# Patient Record
Sex: Female | Born: 1945 | Race: Black or African American | Hispanic: No | Smoking: Current every day smoker
Health system: Southern US, Community
[De-identification: ages and names within clinical notes are randomized; demographics above are authoritative.]

## PROBLEM LIST (undated history)

## (undated) DIAGNOSIS — N6009 Solitary cyst of unspecified breast: Secondary | ICD-10-CM

## (undated) DIAGNOSIS — F32A Depression, unspecified: Secondary | ICD-10-CM

## (undated) DIAGNOSIS — F329 Major depressive disorder, single episode, unspecified: Secondary | ICD-10-CM

## (undated) DIAGNOSIS — C50912 Malignant neoplasm of unspecified site of left female breast: Secondary | ICD-10-CM

## (undated) DIAGNOSIS — Z9289 Personal history of other medical treatment: Secondary | ICD-10-CM

## (undated) DIAGNOSIS — I10 Essential (primary) hypertension: Secondary | ICD-10-CM

## (undated) DIAGNOSIS — I739 Peripheral vascular disease, unspecified: Secondary | ICD-10-CM

## (undated) HISTORY — PX: BREAST CYST EXCISION: SHX579

## (undated) HISTORY — PX: ABDOMINAL HYSTERECTOMY: SHX81

## (undated) HISTORY — DX: Depression, unspecified: F32.A

## (undated) HISTORY — DX: Solitary cyst of unspecified breast: N60.09

## (undated) HISTORY — DX: Major depressive disorder, single episode, unspecified: F32.9

## (undated) HISTORY — DX: Essential (primary) hypertension: I10

## (undated) NOTE — *Deleted (*Deleted)
Oral Oncology Pharmacist Encounter  Received new prescription for palbociclib Ilda Foil) for the treatment of newly metastatic breast cancer that was previously HR+, HER2- in conjunction with anastrozole, planned duration until disease progression or unacceptable drug toxicity. No confirmation of HR or HER2 status.   Labs from 10/13 assessed, CMP and CBC wnl. Prescription dose and frequency assessed.   Current medication list in Epic reviewed, a DDI with omeprazole identified. PPIs may decrease therapeutic efficacy of Ibrance. Omeprazole not on patient's medication list, but was last filled 09/20/19 for 90 days - will confirm with patient if she is taking or not, may consider famotidine for acid supprssion if needed.   Prescription has been e-scribed to the [facility] for benefits analysis and approval. Medication re-sent for tablets instead of capsules.   Oral Oncology Clinic will continue to follow for insurance authorization, copayment issues, initial counseling and start date.  Fara Olden, PharmD PGY2 Hematology/Oncology Pharmacy Resident Oral Chemotherapy Navigation Clinic 11/15/2019 1:08 PM

---
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
## 1999-03-28 ENCOUNTER — Other Ambulatory Visit: Admission: RE | Admit: 1999-03-28 | Discharge: 1999-03-28 | Payer: Self-pay | Admitting: Obstetrics and Gynecology

## 1999-05-05 ENCOUNTER — Encounter: Payer: Self-pay | Admitting: Emergency Medicine

## 1999-05-05 ENCOUNTER — Emergency Department (HOSPITAL_COMMUNITY): Admission: EM | Admit: 1999-05-05 | Discharge: 1999-05-05 | Payer: Self-pay | Admitting: Emergency Medicine

## 2002-08-10 ENCOUNTER — Encounter: Payer: Self-pay | Admitting: Family Medicine

## 2002-08-10 ENCOUNTER — Encounter: Admission: RE | Admit: 2002-08-10 | Discharge: 2002-08-10 | Payer: Self-pay | Admitting: Family Medicine

## 2002-12-10 ENCOUNTER — Encounter (INDEPENDENT_AMBULATORY_CARE_PROVIDER_SITE_OTHER): Payer: Self-pay | Admitting: *Deleted

## 2002-12-10 ENCOUNTER — Ambulatory Visit (HOSPITAL_COMMUNITY): Admission: RE | Admit: 2002-12-10 | Discharge: 2002-12-10 | Payer: Self-pay | Admitting: Gastroenterology

## 2004-05-10 ENCOUNTER — Encounter: Admission: RE | Admit: 2004-05-10 | Discharge: 2004-05-10 | Payer: Self-pay | Admitting: Family Medicine

## 2004-08-20 ENCOUNTER — Other Ambulatory Visit: Admission: RE | Admit: 2004-08-20 | Discharge: 2004-08-20 | Payer: Self-pay | Admitting: Family Medicine

## 2008-01-22 DIAGNOSIS — C50912 Malignant neoplasm of unspecified site of left female breast: Secondary | ICD-10-CM

## 2008-01-22 HISTORY — DX: Malignant neoplasm of unspecified site of left female breast: C50.912

## 2008-11-15 ENCOUNTER — Encounter: Admission: RE | Admit: 2008-11-15 | Discharge: 2008-11-15 | Payer: Self-pay | Admitting: Family Medicine

## 2008-11-23 HISTORY — PX: BREAST LUMPECTOMY: SHX2

## 2008-11-30 ENCOUNTER — Ambulatory Visit: Payer: Self-pay | Admitting: Oncology

## 2008-12-01 ENCOUNTER — Encounter: Admission: RE | Admit: 2008-12-01 | Discharge: 2008-12-01 | Payer: Self-pay | Admitting: Radiology

## 2008-12-07 LAB — CBC WITH DIFFERENTIAL/PLATELET
BASO%: 0.3 % (ref 0.0–2.0)
Basophils Absolute: 0 10*3/uL (ref 0.0–0.1)
EOS%: 1 % (ref 0.0–7.0)
Eosinophils Absolute: 0.1 10*3/uL (ref 0.0–0.5)
HCT: 42.2 % (ref 34.8–46.6)
HGB: 14.5 g/dL (ref 11.6–15.9)
LYMPH%: 43.1 % (ref 14.0–49.7)
MCH: 32.4 pg (ref 25.1–34.0)
MCHC: 34.4 g/dL (ref 31.5–36.0)
MCV: 94.4 fL (ref 79.5–101.0)
MONO#: 1 10*3/uL — ABNORMAL HIGH (ref 0.1–0.9)
MONO%: 12.5 % (ref 0.0–14.0)
NEUT#: 3.3 10*3/uL (ref 1.5–6.5)
NEUT%: 43.1 % (ref 38.4–76.8)
Platelets: 240 10*3/uL (ref 145–400)
RBC: 4.47 10*6/uL (ref 3.70–5.45)
RDW: 12.4 % (ref 11.2–14.5)
WBC: 7.7 10*3/uL (ref 3.9–10.3)
lymph#: 3.3 10*3/uL (ref 0.9–3.3)
nRBC: 0 % (ref 0–0)

## 2008-12-07 LAB — COMPREHENSIVE METABOLIC PANEL
ALT: 19 U/L (ref 0–35)
AST: 21 U/L (ref 0–37)
Albumin: 4.2 g/dL (ref 3.5–5.2)
Alkaline Phosphatase: 59 U/L (ref 39–117)
BUN: 11 mg/dL (ref 6–23)
CO2: 30 mEq/L (ref 19–32)
Calcium: 9.2 mg/dL (ref 8.4–10.5)
Chloride: 101 mEq/L (ref 96–112)
Creatinine, Ser: 0.9 mg/dL (ref 0.40–1.20)
Glucose, Bld: 99 mg/dL (ref 70–99)
Potassium: 3.7 mEq/L (ref 3.5–5.3)
Sodium: 140 mEq/L (ref 135–145)
Total Bilirubin: 0.7 mg/dL (ref 0.3–1.2)
Total Protein: 7.2 g/dL (ref 6.0–8.3)

## 2008-12-07 LAB — CANCER ANTIGEN 27.29: CA 27.29: 22 U/mL (ref 0–39)

## 2008-12-07 LAB — LACTATE DEHYDROGENASE: LDH: 126 U/L (ref 94–250)

## 2008-12-21 ENCOUNTER — Ambulatory Visit (HOSPITAL_COMMUNITY): Admission: RE | Admit: 2008-12-21 | Discharge: 2008-12-21 | Payer: Self-pay | Admitting: Oncology

## 2009-01-31 ENCOUNTER — Ambulatory Visit: Payer: Self-pay | Admitting: Oncology

## 2009-02-02 LAB — CBC WITH DIFFERENTIAL/PLATELET
BASO%: 0.4 % (ref 0.0–2.0)
Basophils Absolute: 0 10*3/uL (ref 0.0–0.1)
EOS%: 1.1 % (ref 0.0–7.0)
Eosinophils Absolute: 0.1 10*3/uL (ref 0.0–0.5)
HCT: 42.6 % (ref 34.8–46.6)
HGB: 14.3 g/dL (ref 11.6–15.9)
LYMPH%: 46.3 % (ref 14.0–49.7)
MCH: 32.1 pg (ref 25.1–34.0)
MCHC: 33.6 g/dL (ref 31.5–36.0)
MCV: 95.5 fL (ref 79.5–101.0)
MONO#: 0.9 10*3/uL (ref 0.1–0.9)
MONO%: 11.3 % (ref 0.0–14.0)
NEUT#: 3.4 10*3/uL (ref 1.5–6.5)
NEUT%: 40.9 % (ref 38.4–76.8)
Platelets: 258 10*3/uL (ref 145–400)
RBC: 4.46 10*6/uL (ref 3.70–5.45)
RDW: 12.7 % (ref 11.2–14.5)
WBC: 8.4 10*3/uL (ref 3.9–10.3)
lymph#: 3.9 10*3/uL — ABNORMAL HIGH (ref 0.9–3.3)
nRBC: 0 % (ref 0–0)

## 2009-02-03 LAB — COMPREHENSIVE METABOLIC PANEL
ALT: 104 U/L — ABNORMAL HIGH (ref 0–35)
AST: 56 U/L — ABNORMAL HIGH (ref 0–37)
Albumin: 4.5 g/dL (ref 3.5–5.2)
Alkaline Phosphatase: 70 U/L (ref 39–117)
BUN: 11 mg/dL (ref 6–23)
CO2: 32 mEq/L (ref 19–32)
Calcium: 10.5 mg/dL (ref 8.4–10.5)
Chloride: 100 mEq/L (ref 96–112)
Creatinine, Ser: 0.86 mg/dL (ref 0.40–1.20)
Glucose, Bld: 85 mg/dL (ref 70–99)
Potassium: 3.7 mEq/L (ref 3.5–5.3)
Sodium: 142 mEq/L (ref 135–145)
Total Bilirubin: 0.5 mg/dL (ref 0.3–1.2)
Total Protein: 7.4 g/dL (ref 6.0–8.3)

## 2009-02-03 LAB — VITAMIN D 25 HYDROXY (VIT D DEFICIENCY, FRACTURES): Vit D, 25-Hydroxy: 72 ng/mL (ref 30–89)

## 2009-02-03 LAB — LACTATE DEHYDROGENASE: LDH: 183 U/L (ref 94–250)

## 2009-02-03 LAB — CANCER ANTIGEN 27.29: CA 27.29: 34 U/mL (ref 0–39)

## 2009-03-28 ENCOUNTER — Ambulatory Visit: Payer: Self-pay | Admitting: Oncology

## 2009-03-30 LAB — COMPREHENSIVE METABOLIC PANEL
ALT: 48 U/L — ABNORMAL HIGH (ref 0–35)
AST: 32 U/L (ref 0–37)
Albumin: 4.5 g/dL (ref 3.5–5.2)
Alkaline Phosphatase: 67 U/L (ref 39–117)
BUN: 13 mg/dL (ref 6–23)
CO2: 29 mEq/L (ref 19–32)
Calcium: 10.2 mg/dL (ref 8.4–10.5)
Chloride: 101 mEq/L (ref 96–112)
Creatinine, Ser: 0.95 mg/dL (ref 0.40–1.20)
Glucose, Bld: 150 mg/dL — ABNORMAL HIGH (ref 70–99)
Potassium: 3.7 mEq/L (ref 3.5–5.3)
Sodium: 141 mEq/L (ref 135–145)
Total Bilirubin: 0.4 mg/dL (ref 0.3–1.2)
Total Protein: 7.5 g/dL (ref 6.0–8.3)

## 2009-03-30 LAB — CBC WITH DIFFERENTIAL/PLATELET
BASO%: 0.8 % (ref 0.0–2.0)
Basophils Absolute: 0.1 10*3/uL (ref 0.0–0.1)
EOS%: 1 % (ref 0.0–7.0)
Eosinophils Absolute: 0.1 10*3/uL (ref 0.0–0.5)
HCT: 42.4 % (ref 34.8–46.6)
HGB: 14.4 g/dL (ref 11.6–15.9)
LYMPH%: 44 % (ref 14.0–49.7)
MCH: 32.6 pg (ref 25.1–34.0)
MCHC: 33.9 g/dL (ref 31.5–36.0)
MCV: 96.2 fL (ref 79.5–101.0)
MONO#: 0.8 10*3/uL (ref 0.1–0.9)
MONO%: 9.7 % (ref 0.0–14.0)
NEUT#: 3.5 10*3/uL (ref 1.5–6.5)
NEUT%: 44.5 % (ref 38.4–76.8)
Platelets: 238 10*3/uL (ref 145–400)
RBC: 4.41 10*6/uL (ref 3.70–5.45)
RDW: 12.9 % (ref 11.2–14.5)
WBC: 7.9 10*3/uL (ref 3.9–10.3)
lymph#: 3.5 10*3/uL — ABNORMAL HIGH (ref 0.9–3.3)

## 2009-03-30 LAB — LACTATE DEHYDROGENASE: LDH: 185 U/L (ref 94–250)

## 2009-06-05 ENCOUNTER — Ambulatory Visit: Payer: Self-pay | Admitting: Oncology

## 2009-07-12 ENCOUNTER — Encounter (INDEPENDENT_AMBULATORY_CARE_PROVIDER_SITE_OTHER): Payer: Self-pay | Admitting: General Surgery

## 2009-07-12 ENCOUNTER — Ambulatory Visit (HOSPITAL_COMMUNITY): Admission: RE | Admit: 2009-07-12 | Discharge: 2009-07-12 | Payer: Self-pay | Admitting: General Surgery

## 2009-07-25 ENCOUNTER — Ambulatory Visit: Payer: Self-pay | Admitting: Oncology

## 2009-07-27 LAB — COMPREHENSIVE METABOLIC PANEL
ALT: 19 U/L (ref 0–35)
AST: 21 U/L (ref 0–37)
Albumin: 4.3 g/dL (ref 3.5–5.2)
Alkaline Phosphatase: 64 U/L (ref 39–117)
BUN: 12 mg/dL (ref 6–23)
CO2: 26 mEq/L (ref 19–32)
Calcium: 9.5 mg/dL (ref 8.4–10.5)
Chloride: 101 mEq/L (ref 96–112)
Creatinine, Ser: 0.8 mg/dL (ref 0.40–1.20)
Glucose, Bld: 128 mg/dL — ABNORMAL HIGH (ref 70–99)
Potassium: 3.8 mEq/L (ref 3.5–5.3)
Sodium: 139 mEq/L (ref 135–145)
Total Bilirubin: 0.3 mg/dL (ref 0.3–1.2)
Total Protein: 7.2 g/dL (ref 6.0–8.3)

## 2009-07-27 LAB — CBC WITH DIFFERENTIAL/PLATELET
BASO%: 0.2 % (ref 0.0–2.0)
Basophils Absolute: 0 10*3/uL (ref 0.0–0.1)
EOS%: 1.1 % (ref 0.0–7.0)
Eosinophils Absolute: 0.1 10*3/uL (ref 0.0–0.5)
HCT: 40.8 % (ref 34.8–46.6)
HGB: 14 g/dL (ref 11.6–15.9)
LYMPH%: 44.5 % (ref 14.0–49.7)
MCH: 31.7 pg (ref 25.1–34.0)
MCHC: 34.3 g/dL (ref 31.5–36.0)
MCV: 92.3 fL (ref 79.5–101.0)
MONO#: 0.9 10*3/uL (ref 0.1–0.9)
MONO%: 9.6 % (ref 0.0–14.0)
NEUT#: 4.2 10*3/uL (ref 1.5–6.5)
NEUT%: 44.6 % (ref 38.4–76.8)
Platelets: 300 10*3/uL (ref 145–400)
RBC: 4.42 10*6/uL (ref 3.70–5.45)
RDW: 12.9 % (ref 11.2–14.5)
WBC: 9.4 10*3/uL (ref 3.9–10.3)
lymph#: 4.2 10*3/uL — ABNORMAL HIGH (ref 0.9–3.3)
nRBC: 0 % (ref 0–0)

## 2009-07-27 LAB — LACTATE DEHYDROGENASE: LDH: 170 U/L (ref 94–250)

## 2009-08-04 ENCOUNTER — Ambulatory Visit (HOSPITAL_BASED_OUTPATIENT_CLINIC_OR_DEPARTMENT_OTHER): Admission: RE | Admit: 2009-08-04 | Discharge: 2009-08-04 | Payer: Self-pay | Admitting: General Surgery

## 2009-08-15 ENCOUNTER — Ambulatory Visit: Admission: RE | Admit: 2009-08-15 | Discharge: 2009-11-10 | Payer: Self-pay | Admitting: Radiation Oncology

## 2009-09-13 ENCOUNTER — Encounter: Admission: RE | Admit: 2009-09-13 | Discharge: 2009-09-13 | Payer: Self-pay | Admitting: Otolaryngology

## 2009-11-24 ENCOUNTER — Ambulatory Visit: Payer: Self-pay | Admitting: Oncology

## 2009-11-28 ENCOUNTER — Ambulatory Visit
Admission: RE | Admit: 2009-11-28 | Discharge: 2009-11-28 | Payer: Self-pay | Source: Home / Self Care | Admitting: Radiation Oncology

## 2009-11-28 LAB — CBC WITH DIFFERENTIAL/PLATELET
BASO%: 0.5 % (ref 0.0–2.0)
Basophils Absolute: 0 10*3/uL (ref 0.0–0.1)
EOS%: 4.3 % (ref 0.0–7.0)
Eosinophils Absolute: 0.2 10*3/uL (ref 0.0–0.5)
HCT: 39.6 % (ref 34.8–46.6)
HGB: 13.7 g/dL (ref 11.6–15.9)
LYMPH%: 23.1 % (ref 14.0–49.7)
MCH: 32.3 pg (ref 25.1–34.0)
MCHC: 34.5 g/dL (ref 31.5–36.0)
MCV: 93.5 fL (ref 79.5–101.0)
MONO#: 0.9 10*3/uL (ref 0.1–0.9)
MONO%: 19.2 % — ABNORMAL HIGH (ref 0.0–14.0)
NEUT#: 2.5 10*3/uL (ref 1.5–6.5)
NEUT%: 52.9 % (ref 38.4–76.8)
Platelets: 260 10*3/uL (ref 145–400)
RBC: 4.24 10*6/uL (ref 3.70–5.45)
RDW: 13.2 % (ref 11.2–14.5)
WBC: 4.6 10*3/uL (ref 3.9–10.3)
lymph#: 1.1 10*3/uL (ref 0.9–3.3)

## 2009-11-28 LAB — COMPREHENSIVE METABOLIC PANEL
ALT: 21 U/L (ref 0–35)
AST: 23 U/L (ref 0–37)
Albumin: 4.5 g/dL (ref 3.5–5.2)
Alkaline Phosphatase: 79 U/L (ref 39–117)
BUN: 13 mg/dL (ref 6–23)
CO2: 28 mEq/L (ref 19–32)
Calcium: 9.6 mg/dL (ref 8.4–10.5)
Chloride: 102 mEq/L (ref 96–112)
Creatinine, Ser: 0.85 mg/dL (ref 0.40–1.20)
Glucose, Bld: 111 mg/dL — ABNORMAL HIGH (ref 70–99)
Potassium: 3.4 mEq/L — ABNORMAL LOW (ref 3.5–5.3)
Sodium: 140 mEq/L (ref 135–145)
Total Bilirubin: 0.4 mg/dL (ref 0.3–1.2)
Total Protein: 7 g/dL (ref 6.0–8.3)

## 2010-04-07 LAB — POCT HEMOGLOBIN-HEMACUE: Hemoglobin: 12.4 g/dL (ref 12.0–15.0)

## 2010-04-08 LAB — CBC
HCT: 42.3 % (ref 36.0–46.0)
Hemoglobin: 14.5 g/dL (ref 12.0–15.0)
MCHC: 34.3 g/dL (ref 30.0–36.0)
MCV: 94.6 fL (ref 78.0–100.0)
Platelets: 218 10*3/uL (ref 150–400)
RBC: 4.47 MIL/uL (ref 3.87–5.11)
RDW: 13 % (ref 11.5–15.5)
WBC: 7.6 10*3/uL (ref 4.0–10.5)

## 2010-04-08 LAB — BASIC METABOLIC PANEL
BUN: 8 mg/dL (ref 6–23)
BUN: 8 mg/dL (ref 6–23)
CO2: 31 mEq/L (ref 19–32)
CO2: 30 mEq/L (ref 19–32)
Calcium: 9.8 mg/dL (ref 8.4–10.5)
Calcium: 10 mg/dL (ref 8.4–10.5)
Chloride: 102 mEq/L (ref 96–112)
Chloride: 100 mEq/L (ref 96–112)
Creatinine, Ser: 0.79 mg/dL (ref 0.4–1.2)
Creatinine, Ser: 0.87 mg/dL (ref 0.4–1.2)
GFR calc Af Amer: >60 mL/min (ref >60)
GFR calc Af Amer: >60 mL/min (ref >60)
GFR calc non Af Amer: >60 mL/min (ref >60)
GFR calc non Af Amer: >60 mL/min (ref >60)
Glucose, Bld: 111 mg/dL — ABNORMAL HIGH (ref 70–99)
Glucose, Bld: 93 mg/dL (ref 70–99)
Potassium: 3.8 mEq/L (ref 3.5–5.1)
Potassium: 4 mEq/L (ref 3.5–5.1)
Sodium: 141 mEq/L (ref 135–145)
Sodium: 138 mEq/L (ref 135–145)

## 2010-04-08 LAB — DIFFERENTIAL
Basophils Absolute: 0.1 10*3/uL (ref 0.0–0.1)
Basophils Relative: 1 % (ref 0–1)
Eosinophils Absolute: 0.1 10*3/uL (ref 0.0–0.7)
Eosinophils Relative: 1 % (ref 0–5)
Lymphocytes Relative: 48 % — ABNORMAL HIGH (ref 12–46)
Lymphs Abs: 3.7 10*3/uL (ref 0.7–4.0)
Monocytes Absolute: 0.8 10*3/uL (ref 0.1–1.0)
Monocytes Relative: 10 % (ref 3–12)
Neutro Abs: 3.1 10*3/uL (ref 1.7–7.7)
Neutrophils Relative %: 40 % — ABNORMAL LOW (ref 43–77)

## 2010-05-22 ENCOUNTER — Encounter (HOSPITAL_BASED_OUTPATIENT_CLINIC_OR_DEPARTMENT_OTHER): Payer: 59 | Admitting: Oncology

## 2010-05-22 ENCOUNTER — Other Ambulatory Visit: Payer: Self-pay | Admitting: Oncology

## 2010-05-22 DIAGNOSIS — C50419 Malignant neoplasm of upper-outer quadrant of unspecified female breast: Secondary | ICD-10-CM

## 2010-05-22 DIAGNOSIS — Z170 Estrogen receptor positive status [ER+]: Secondary | ICD-10-CM

## 2010-05-22 LAB — CBC WITH DIFFERENTIAL/PLATELET
BASO%: 0.5 % (ref 0.0–2.0)
Basophils Absolute: 0 10*3/uL (ref 0.0–0.1)
EOS%: 1.3 % (ref 0.0–7.0)
Eosinophils Absolute: 0.1 10*3/uL (ref 0.0–0.5)
HCT: 40.2 % (ref 34.8–46.6)
HGB: 13.9 g/dL (ref 11.6–15.9)
LYMPH%: 35.8 % (ref 14.0–49.7)
MCH: 32.2 pg (ref 25.1–34.0)
MCHC: 34.6 g/dL (ref 31.5–36.0)
MCV: 93 fL (ref 79.5–101.0)
MONO#: 0.6 10*3/uL (ref 0.1–0.9)
MONO%: 10.4 % (ref 0.0–14.0)
NEUT#: 3.1 10*3/uL (ref 1.5–6.5)
NEUT%: 52 % (ref 38.4–76.8)
Platelets: 227 10*3/uL (ref 145–400)
RBC: 4.32 10*6/uL (ref 3.70–5.45)
RDW: 13.2 % (ref 11.2–14.5)
WBC: 5.9 10*3/uL (ref 3.9–10.3)
lymph#: 2.1 10*3/uL (ref 0.9–3.3)

## 2010-05-23 LAB — COMPREHENSIVE METABOLIC PANEL
ALT: 23 U/L (ref 0–35)
AST: 21 U/L (ref 0–37)
Albumin: 4.3 g/dL (ref 3.5–5.2)
Alkaline Phosphatase: 74 U/L (ref 39–117)
BUN: 10 mg/dL (ref 6–23)
CO2: 23 mEq/L (ref 19–32)
Calcium: 9.6 mg/dL (ref 8.4–10.5)
Chloride: 103 mEq/L (ref 96–112)
Creatinine, Ser: 0.82 mg/dL (ref 0.40–1.20)
Glucose, Bld: 92 mg/dL (ref 70–99)
Potassium: 4 mEq/L (ref 3.5–5.3)
Sodium: 139 mEq/L (ref 135–145)
Total Bilirubin: 0.5 mg/dL (ref 0.3–1.2)
Total Protein: 7.3 g/dL (ref 6.0–8.3)

## 2010-05-23 LAB — VITAMIN D 25 HYDROXY (VIT D DEFICIENCY, FRACTURES): Vit D, 25-Hydroxy: 79 ng/mL (ref 30–89)

## 2010-05-29 ENCOUNTER — Encounter (HOSPITAL_BASED_OUTPATIENT_CLINIC_OR_DEPARTMENT_OTHER): Payer: 59 | Admitting: Oncology

## 2010-05-29 DIAGNOSIS — C50419 Malignant neoplasm of upper-outer quadrant of unspecified female breast: Secondary | ICD-10-CM

## 2010-05-29 DIAGNOSIS — Z170 Estrogen receptor positive status [ER+]: Secondary | ICD-10-CM

## 2010-06-08 NOTE — Op Note (Signed)
NAME:  Leslie [name], Leslie [name]                         ACCOUNT NO.:  [id number]   MEDICAL RECORD NO.:  [id number]                   PATIENT TYPE:  AMB   LOCATION:  ENDO                                 FACILITY:  [facility]   PHYSICIAN:  Danise Edge, M.D.                DATE OF BIRTH:  03-Jan-1946   DATE OF PROCEDURE:  12/10/2002  DATE OF DISCHARGE:                                 OPERATIVE REPORT   PROCEDURE:  Screening colonoscopy.   INDICATIONS FOR PROCEDURE:  [name] is a 65 year old female  born [birth year].  [name] is scheduled to undergo her first  screening colonoscopy with polypectomy to prevent colon cancer.   ENDOSCOPIST:  Charolett Bumpers, M.D.   PREMEDICATION:  Versed 5 mg, Demerol 50 mg .   DESCRIPTION OF PROCEDURE:  After obtaining informed consent, [name] was  placed in the left lateral decubitus position. I administered intravenous  Demerol and intravenous Versed to achieve conscious sedation for the  procedure. The patient's blood pressure, oxygen saturation and cardiac  rhythm were monitored throughout the procedure and documented in the medical  record.   Anal inspection was normal. Digital rectal exam was normal. The Olympus  pediatric colonoscope was introduced into the rectum and advanced to the  cecum. [name] had a large amount of leafy vegetable matter in her right  colon and residual thick pasty stool coating her left colon. Small polyps  could have easily been missed during this exam.   RECTUM:  Normal.   SIGMOID COLON AND DESCENDING COLON:  A diminutive 1 mm sessile polyp was  removed from the distal sigmoid colon with the cold biopsy forceps.   SPLENIC FLEXURE:  Normal.   TRANSVERSE COLON:  Normal.   HEPATIC FLEXURE:  Normal.   ASCENDING COLON:  Normal.   CECUM AND ILEOCECAL VALVE:  Normal.   ASSESSMENT:  A 1 mm diminutive polyp was removed from the distal sigmoid  colon; otherwise, proctocolonoscopy to the cecum was  normal but mucosal  visualization was limited by residual vegetable matter and pasty stool.   RECOMMENDATIONS:  Repeat colorectal neoplasia screening in 3-5 years.                                               Danise Edge, M.D.    MJ/MEDQ  D:  12/10/2002  T:  12/10/2002  Job:  [id number]   cc:   Donia Guiles, M.D.  [address] [place]  [place] [address]  Fax: [phone]

## 2010-06-15 ENCOUNTER — Encounter (INDEPENDENT_AMBULATORY_CARE_PROVIDER_SITE_OTHER): Payer: Self-pay | Admitting: General Surgery

## 2010-08-17 ENCOUNTER — Emergency Department (HOSPITAL_COMMUNITY): Payer: 59

## 2010-08-17 ENCOUNTER — Emergency Department (HOSPITAL_COMMUNITY)
Admission: EM | Admit: 2010-08-17 | Discharge: 2010-08-17 | Disposition: A | Discharge status: Home / Self Care | Payer: 59 | Attending: Emergency Medicine | Admitting: Emergency Medicine

## 2010-08-17 DIAGNOSIS — I10 Essential (primary) hypertension: Secondary | ICD-10-CM | POA: Insufficient documentation

## 2010-08-17 DIAGNOSIS — R109 Unspecified abdominal pain: Secondary | ICD-10-CM | POA: Insufficient documentation

## 2010-08-17 DIAGNOSIS — Z853 Personal history of malignant neoplasm of breast: Secondary | ICD-10-CM | POA: Insufficient documentation

## 2010-08-17 DIAGNOSIS — R110 Nausea: Secondary | ICD-10-CM | POA: Insufficient documentation

## 2010-08-17 DIAGNOSIS — Z9089 Acquired absence of other organs: Secondary | ICD-10-CM | POA: Insufficient documentation

## 2010-08-17 DIAGNOSIS — Z90710 Acquired absence of both cervix and uterus: Secondary | ICD-10-CM | POA: Insufficient documentation

## 2010-08-17 LAB — COMPREHENSIVE METABOLIC PANEL
ALT: 18 U/L (ref 0–35)
AST: 18 U/L (ref 0–37)
Albumin: 3.4 g/dL — ABNORMAL LOW (ref 3.5–5.2)
Alkaline Phosphatase: 68 U/L (ref 39–117)
BUN: 9 mg/dL (ref 6–23)
CO2: 28 mEq/L (ref 19–32)
Calcium: 9.6 mg/dL (ref 8.4–10.5)
Chloride: 98 mEq/L (ref 96–112)
Creatinine, Ser: 0.68 mg/dL (ref 0.50–1.10)
GFR calc Af Amer: >60 mL/min (ref >60)
GFR calc non Af Amer: >60 mL/min (ref >60)
Glucose, Bld: 115 mg/dL — ABNORMAL HIGH (ref 70–99)
Potassium: 3.7 mEq/L (ref 3.5–5.1)
Sodium: 134 mEq/L — ABNORMAL LOW (ref 135–145)
Total Bilirubin: 0.4 mg/dL (ref 0.3–1.2)
Total Protein: 7.3 g/dL (ref 6.0–8.3)

## 2010-08-17 LAB — DIFFERENTIAL
Basophils Absolute: 0 10*3/uL (ref 0.0–0.1)
Basophils Absolute: 0 10*3/uL (ref 0.0–0.1)
Basophils Relative: 0 % (ref 0–1)
Basophils Relative: 0 % (ref 0–1)
Eosinophils Absolute: 0 10*3/uL (ref 0.0–0.7)
Eosinophils Absolute: 0 10*3/uL (ref 0.0–0.7)
Eosinophils Relative: 0 % (ref 0–5)
Eosinophils Relative: 0 % (ref 0–5)
Lymphocytes Relative: 24 % (ref 12–46)
Lymphocytes Relative: 28 % (ref 12–46)
Lymphs Abs: 1.1 10*3/uL (ref 0.7–4.0)
Lymphs Abs: 2 10*3/uL (ref 0.7–4.0)
Monocytes Absolute: 0.3 10*3/uL (ref 0.1–1.0)
Monocytes Absolute: 0.6 10*3/uL (ref 0.1–1.0)
Monocytes Relative: 7 % (ref 3–12)
Monocytes Relative: 9 % (ref 3–12)
Neutro Abs: 3 10*3/uL (ref 1.7–7.7)
Neutro Abs: 4.5 10*3/uL (ref 1.7–7.7)
Neutrophils Relative %: 68 % (ref 43–77)
Neutrophils Relative %: 63 % (ref 43–77)

## 2010-08-17 LAB — BASIC METABOLIC PANEL
BUN: 6 mg/dL (ref 6–23)
CO2: 16 mEq/L — ABNORMAL LOW (ref 19–32)
Calcium: 4.4 mg/dL — CL (ref 8.4–10.5)
Chloride: 123 mEq/L — ABNORMAL HIGH (ref 96–112)
Creatinine, Ser: <0.47 mg/dL — ABNORMAL LOW (ref 0.50–1.10)
Glucose, Bld: 69 mg/dL — ABNORMAL LOW (ref 70–99)
Potassium: <2 mEq/L — CL (ref 3.5–5.1)
Sodium: 144 mEq/L (ref 135–145)

## 2010-08-17 LAB — CBC
HCT: 23.4 % — ABNORMAL LOW (ref 36.0–46.0)
HCT: 37.6 % (ref 36.0–46.0)
Hemoglobin: 7.8 g/dL — ABNORMAL LOW (ref 12.0–15.0)
Hemoglobin: 12.2 g/dL (ref 12.0–15.0)
MCH: 31 pg (ref 26.0–34.0)
MCH: 30 pg (ref 26.0–34.0)
MCHC: 33.3 g/dL (ref 30.0–36.0)
MCHC: 32.4 g/dL (ref 30.0–36.0)
MCV: 92.9 fL (ref 78.0–100.0)
MCV: 92.4 fL (ref 78.0–100.0)
Platelets: 147 10*3/uL — ABNORMAL LOW (ref 150–400)
Platelets: 250 10*3/uL (ref 150–400)
RBC: 2.52 MIL/uL — ABNORMAL LOW (ref 3.87–5.11)
RBC: 4.07 MIL/uL (ref 3.87–5.11)
RDW: 12.6 % (ref 11.5–15.5)
RDW: 12.6 % (ref 11.5–15.5)
WBC: 4.4 10*3/uL (ref 4.0–10.5)
WBC: 7.2 10*3/uL (ref 4.0–10.5)

## 2010-08-17 LAB — URINALYSIS, ROUTINE W REFLEX MICROSCOPIC
Bilirubin Urine: NEGATIVE
Glucose, UA: NEGATIVE mg/dL
Hgb urine dipstick: NEGATIVE
Ketones, ur: NEGATIVE mg/dL
Leukocytes, UA: NEGATIVE
Nitrite: NEGATIVE
Protein, ur: NEGATIVE mg/dL
Specific Gravity, Urine: 1.017 (ref 1.005–1.030)
Urobilinogen, UA: 0.2 mg/dL (ref 0.0–1.0)
pH: 8 (ref 5.0–8.0)

## 2010-08-17 LAB — LIPASE, BLOOD: Lipase: 13 U/L (ref 11–59)

## 2010-08-17 MED ORDER — IOHEXOL 300 MG/ML  SOLN
100.0000 mL | Freq: Once | INTRAMUSCULAR | Status: AC | PRN
  Administered 2010-08-17: 100 mL via INTRAVENOUS

## 2010-10-30 ENCOUNTER — Encounter: Payer: Self-pay | Admitting: *Deleted

## 2010-11-22 ENCOUNTER — Encounter (HOSPITAL_BASED_OUTPATIENT_CLINIC_OR_DEPARTMENT_OTHER): Payer: 59 | Admitting: Oncology

## 2010-11-22 ENCOUNTER — Other Ambulatory Visit: Payer: Self-pay | Admitting: Oncology

## 2010-11-22 DIAGNOSIS — C50419 Malignant neoplasm of upper-outer quadrant of unspecified female breast: Secondary | ICD-10-CM

## 2010-11-22 DIAGNOSIS — Z170 Estrogen receptor positive status [ER+]: Secondary | ICD-10-CM

## 2010-11-22 LAB — CBC WITH DIFFERENTIAL/PLATELET
BASO%: 0.5 % (ref 0.0–2.0)
Basophils Absolute: 0 10*3/uL (ref 0.0–0.1)
EOS%: 0.6 % (ref 0.0–7.0)
Eosinophils Absolute: 0 10*3/uL (ref 0.0–0.5)
HCT: 40.5 % (ref 34.8–46.6)
HGB: 14.1 g/dL (ref 11.6–15.9)
LYMPH%: 44.4 % (ref 14.0–49.7)
MCH: 32.1 pg (ref 25.1–34.0)
MCHC: 34.7 g/dL (ref 31.5–36.0)
MCV: 92.7 fL (ref 79.5–101.0)
MONO#: 0.6 10*3/uL (ref 0.1–0.9)
MONO%: 10.8 % (ref 0.0–14.0)
NEUT#: 2.3 10*3/uL (ref 1.5–6.5)
NEUT%: 43.7 % (ref 38.4–76.8)
Platelets: 236 10*3/uL (ref 145–400)
RBC: 4.38 10*6/uL (ref 3.70–5.45)
RDW: 12.7 % (ref 11.2–14.5)
WBC: 5.4 10*3/uL (ref 3.9–10.3)
lymph#: 2.4 10*3/uL (ref 0.9–3.3)

## 2010-11-23 LAB — COMPREHENSIVE METABOLIC PANEL
ALT: 20 U/L (ref 0–35)
AST: 20 U/L (ref 0–37)
Albumin: 4.3 g/dL (ref 3.5–5.2)
Alkaline Phosphatase: 71 U/L (ref 39–117)
BUN: 12 mg/dL (ref 6–23)
CO2: 25 mEq/L (ref 19–32)
Calcium: 9.9 mg/dL (ref 8.4–10.5)
Chloride: 104 mEq/L (ref 96–112)
Creatinine, Ser: 0.82 mg/dL (ref 0.50–1.10)
Glucose, Bld: 117 mg/dL — ABNORMAL HIGH (ref 70–99)
Potassium: 3.7 mEq/L (ref 3.5–5.3)
Sodium: 141 mEq/L (ref 135–145)
Total Bilirubin: 0.5 mg/dL (ref 0.3–1.2)
Total Protein: 7.1 g/dL (ref 6.0–8.3)

## 2010-11-23 LAB — VITAMIN D 25 HYDROXY (VIT D DEFICIENCY, FRACTURES): Vit D, 25-Hydroxy: 92 ng/mL — ABNORMAL HIGH (ref 30–89)

## 2010-11-26 ENCOUNTER — Ambulatory Visit (HOSPITAL_BASED_OUTPATIENT_CLINIC_OR_DEPARTMENT_OTHER): Payer: 59 | Admitting: Oncology

## 2010-11-26 VITALS — BP 129/78 | HR 79 | Temp 98.8°F | Ht 62.5 in | Wt 141.9 lb

## 2010-11-26 DIAGNOSIS — Z170 Estrogen receptor positive status [ER+]: Secondary | ICD-10-CM

## 2010-11-26 DIAGNOSIS — N951 Menopausal and female climacteric states: Secondary | ICD-10-CM

## 2010-11-26 DIAGNOSIS — C50919 Malignant neoplasm of unspecified site of unspecified female breast: Secondary | ICD-10-CM

## 2010-11-26 DIAGNOSIS — R232 Flushing: Secondary | ICD-10-CM

## 2010-11-26 DIAGNOSIS — C50419 Malignant neoplasm of upper-outer quadrant of unspecified female breast: Secondary | ICD-10-CM

## 2010-11-26 NOTE — Progress Notes (Deleted)
Hematology and Oncology Follow Up Visit  Leslie [name] [id number] 02/19/1945 65 y.o. 11/26/2010 2:29 PM  Copies to [place] R. Kennyth Lose s moody Principle Diagnosis: Locally advanced ER positive, PR positive breast cancer status post neoadjuvant chemotherapy with Femara, status post lumpectomy 07/12/2008 with residual node positive disease, status post completion of radiation 11/10/2009 on ongoing Femara.**  Interim History: [name] returns for followup. She is feeling well apart from hot flashes. She's had no other major intercurrent problems or illnesses. Medications: Please see medication list.   Allergies:  Allergies  Allergen Reactions  . Aspirin     nausea    Past Medical History, Surgical history, Social history, and Family History were reviewed and updated.  Review of Systems: Constitutional: Normal heart for hot flashes Negative for fever, chills, night sweats, anorexia, weight loss, pain. Cardiovascular: no chest pain or dyspnea on exertion Respiratory: no cough, shortness of breath, or wheezing Neurological: negative Dermatological: negative ENT: negative Skin Gastrointestinal: no abdominal pain, change in bowel habits, or black or bloody stools Genito-Urinary: no dysuria, trouble voiding, or hematuria Hematological and Lymphatic: negative Breast: negative for breast lumps Musculoskeletal: negative Remaining ROS negative.  Physical Exam: Blood pressure 129/78, pulse 79, temperature 98.8 F (37.1 C), temperature source Oral, height 5' 2.5" (1.588 m), weight 64.365 kg (141 lb 14.4 oz). ECOG:  General appearance: alert and cooperative Head: Normocephalic, without obvious abnormality, atraumatic Neck: no adenopathy, no carotid bruit, no JVD, supple, symmetrical, trachea midline and thyroid not enlarged, symmetric, no tenderness/mass/nodules Lymph nodes: Cervical, supraclavicular, and axillary nodes normal. Breast exam : Right breast  normal left breast status post lumpectomy and radiation,  stigmata of radiation therapy with discoloration of breast and generalized firmness throughout. There are no obvious lumps or evidence of nipple retraction.  Lab Results: Lab Results  Component Value Date   WBC 7.2 08/17/2010   HGB 14.1 11/22/2010   HCT 40.5 11/22/2010   MCV 92.7 11/22/2010   PLT 236 11/22/2010     Chemistry      Component Value Date/Time   NA 141 11/22/2010 1143   NA 141 11/22/2010 1143   NA 141 11/22/2010 1143   K 3.7 11/22/2010 1143   K 3.7 11/22/2010 1143   K 3.7 11/22/2010 1143   CL 104 11/22/2010 1143   CL 104 11/22/2010 1143   CL 104 11/22/2010 1143   CO2 25 11/22/2010 1143   CO2 25 11/22/2010 1143   CO2 25 11/22/2010 1143   BUN 12 11/22/2010 1143   BUN 12 11/22/2010 1143   BUN 12 11/22/2010 1143   CREATININE 0.82 11/22/2010 1143   CREATININE 0.82 11/22/2010 1143   CREATININE 0.82 11/22/2010 1143      Component Value Date/Time   CALCIUM 9.9 11/22/2010 1143   CALCIUM 9.9 11/22/2010 1143   CALCIUM 9.9 11/22/2010 1143   ALKPHOS 71 11/22/2010 1143   ALKPHOS 71 11/22/2010 1143   ALKPHOS 71 11/22/2010 1143   AST 20 11/22/2010 1143   AST 20 11/22/2010 1143   AST 20 11/22/2010 1143   ALT 20 11/22/2010 1143   ALT 20 11/22/2010 1143   ALT 20 11/22/2010 1143   BILITOT 0.5 11/22/2010 1143   BILITOT 0.5 11/22/2010 1143   BILITOT 0.5 11/22/2010 1143       Radiological Studies: chest X-ray None  Impression and Plan: Leslie [name] is doing well. We will schedule her  next mammogram. I have instructed her on cutting back on her vitamin D which he currently takes 5000 units per day. I recommend she take 1000 units a day. We'll see her in followup in 6 months time with appropriate imaging studies. I have discussed hot flash management with her and have recommended she states Effexor XR 75 mg daily.  30 minutes was spent with this patient half that time spent more than half the time coordinating care.    Pierce Crane, MD 11/5/20122:29  PM

## 2010-11-26 NOTE — Progress Notes (Signed)
Hematology and Oncology Follow Up Visit  Leslie [name] [id number] [birth year] 64 y.o. 11/26/2010 8:21 PM   Principle Diagnosis: Locally advanced ER positive PR positive breast cancer status post neoadjuvant therapy with Femara, status post lumpectomy 07/12/2008 with residual node positive disease status post completion of radiation and 11/10/2008 on Femara.  Interim History:   Leslie [name] returns for followup; she continues to tolerate the Femara. She continues to have hot flashes apparently the megace did not help. She  denies any joint pain.  Medications: I have reviewed the patient's current medications.  Allergies:  Allergies  Allergen Reactions  . Aspirin     nausea    Past Medical History, Surgical history, Social history, and Family History were reviewed and updated.  Review of Systems: Constitutional:  Negative for fever, chills, night sweats, anorexia, weight loss, pain. Cardiovascular: no chest pain or dyspnea on exertion Respiratory: no cough, shortness of breath, or wheezing Neurological: no TIA or stroke symptoms Dermatological: negative ENT: negative Skin Gastrointestinal: no abdominal pain, change in bowel habits, or black or bloody stools Genito-Urinary: no dysuria, trouble voiding, or hematuria Hematological and Lymphatic: negative Breast: negative for breast lumps Musculoskeletal: negative Remaining ROS negative.  Physical Exam: Blood pressure 129/78, pulse 79, temperature 98.8 F (37.1 C), temperature source Oral, height 5' 2.5" (1.588 m), weight 64.365 kg (141 lb 14.4 oz). ECOG: 0 General appearance: alert, cooperative and appears stated age Head: Normocephalic, without obvious abnormality, atraumatic Neck: no adenopathy, no carotid bruit, no JVD, supple, symmetrical, trachea midline and thyroid not enlarged, symmetric, no tenderness/mass/nodules Lymph nodes: Cervical, supraclavicular, and axillary nodes normal. Cardiac : Normal , no heaves thrills  bruits or murmur Pulmonary: No rales or rhonchi Breasts: Left breast status post lumpectomy radiation, radiation-related skin changes generalized firmness throughout the breast including scar line . right breast normal. Both axillae examined and were found to be normal. Abdomen: No organomegaly or masses Extremities no cyanosis clubbing or edema Neuro: Normal  Lab Results: Lab Results  Component Value Date   WBC 7.2 08/17/2010   HGB 14.1 11/22/2010   HCT 40.5 11/22/2010   MCV 92.7 11/22/2010   PLT 236 11/22/2010     Chemistry      Component Value Date/Time   NA 141 11/22/2010 1143   NA 141 11/22/2010 1143   NA 141 11/22/2010 1143   K 3.7 11/22/2010 1143   K 3.7 11/22/2010 1143   K 3.7 11/22/2010 1143   CL 104 11/22/2010 1143   CL 104 11/22/2010 1143   CL 104 11/22/2010 1143   CO2 25 11/22/2010 1143   CO2 25 11/22/2010 1143   CO2 25 11/22/2010 1143   BUN 12 11/22/2010 1143   BUN 12 11/22/2010 1143   BUN 12 11/22/2010 1143   CREATININE 0.82 11/22/2010 1143   CREATININE 0.82 11/22/2010 1143   CREATININE 0.82 11/22/2010 1143      Component Value Date/Time   CALCIUM 9.9 11/22/2010 1143   CALCIUM 9.9 11/22/2010 1143   CALCIUM 9.9 11/22/2010 1143   ALKPHOS 71 11/22/2010 1143   ALKPHOS 71 11/22/2010 1143   ALKPHOS 71 11/22/2010 1143   AST 20 11/22/2010 1143   AST 20 11/22/2010 1143   AST 20 11/22/2010 1143   ALT 20 11/22/2010 1143   ALT 20 11/22/2010 1143   ALT 20 11/22/2010 1143   BILITOT 0.5 11/22/2010 1143   BILITOT 0.5 11/22/2010 1143   BILITOT 0.5 11/22/2010 1143       Radiological Studies: chest X-ray None  Impression and Plan: Leslie [name] is doing well. She tolerates Femara. I have recommended she try effexor given her a prescription for the xr variety starting at 75 mg a day. She has been scheduled for mammogram in January 2012.  Spent more than half the time coordinating care.    Pierce Crane, MD 11/5/20128:21 PM

## 2010-11-27 ENCOUNTER — Telehealth: Payer: Self-pay | Admitting: Oncology

## 2010-11-27 NOTE — Telephone Encounter (Signed)
Mailed pt appt for [id number] and mammogram appt for jan2013 to pt on 11/27/2010

## 2010-11-29 ENCOUNTER — Telehealth: Payer: Self-pay | Admitting: *Deleted

## 2010-11-29 ENCOUNTER — Ambulatory Visit (INDEPENDENT_AMBULATORY_CARE_PROVIDER_SITE_OTHER): Payer: 59 | Admitting: General Surgery

## 2010-11-29 ENCOUNTER — Encounter (INDEPENDENT_AMBULATORY_CARE_PROVIDER_SITE_OTHER): Payer: Self-pay | Admitting: General Surgery

## 2010-11-29 ENCOUNTER — Ambulatory Visit: Payer: 59 | Admitting: Oncology

## 2010-11-29 VITALS — BP 110/68 | HR 66 | Temp 97.2°F | Resp 16 | Ht 60.75 in | Wt 141.0 lb

## 2010-11-29 DIAGNOSIS — Z853 Personal history of malignant neoplasm of breast: Secondary | ICD-10-CM

## 2010-11-29 NOTE — Telephone Encounter (Signed)
CALLLED PATIENT LEFT MESSAGE TO INFORM THE PATIENT OF THE NEW DATE AND TIME

## 2010-11-29 NOTE — Progress Notes (Signed)
Subjective:     Patient ID: Leslie [name], female   DOB: [birth year], 65 y.o.   MRN: [id number]  HPI This patient follows up status post left lumpectomy and axillary dissection in 2011 for a locally advanced left breast cancer. She underwent neoadjuvant treatment and had a followup surgery. She states that she has been doing well. She is taking Femara and is followed by Dr. Donnie Coffin. She is doing self breast exam and denies any changes. She denies any headaches or weight loss her only complaint is bilateral thumb pain which she states began after she started taking Femara.  Review of Systems     Objective:   Physical Exam No acute distress and nontoxic-appearing  Her right breast is normal except for a prior right breast biopsy incision. There is no suspicious masses were no suspicious lymphadenopathy.  Her left breast has a well-healed surgical incision without any evidence of suspicious masses. The tissue is woody in there or skin changes consistent with prior radiation treatment but there is again no suspicious masses or lymphadenopathy.   Assessment:     Status post left lumpectomy and axillary dissection. She seems to be doing very well and no evidence of recurrence.    Plan:     Recommended that she followup in January or February after her mammogram. We will see her back annually. I recommend that she continue self breast exams monthly and annual clinical exam and annual mammogram.I explained that she has high risk for recurrence and expressed the importance of vigilance and continued followup for her surveillance of breast cancer.

## 2011-02-06 ENCOUNTER — Telehealth: Payer: Self-pay | Admitting: *Deleted

## 2011-02-06 NOTE — Telephone Encounter (Addendum)
Pt. Called.  She went to ED in July for abdominal pain and she was told to stop her Femara for 10 days.  She did, the pain went away and she restarted the femara without further problems.  (She did not change to arimidex as was suggested in the 8/12 telephone notes.  She did not like the possbile side effects).    Last night she started with the same pain as before in her left side.  She rated it a 9/10.  It was an aching, constant pain.  She took  a hydrocodone 5/500 and the pain went completely away but it is returning this am.  Her last BM was this am and was no problem. She denies fever, nausea, vomiting or diarrhea and has no other sx except for the pain Discussed with Debbora Presto PA:  Pt. Needs to see her PCP for this.   Called pt. And she will call her PCP:  Dr. Clelia Croft

## 2011-02-14 ENCOUNTER — Encounter (INDEPENDENT_AMBULATORY_CARE_PROVIDER_SITE_OTHER): Payer: Self-pay

## 2011-02-19 ENCOUNTER — Telehealth: Payer: Self-pay | Admitting: *Deleted

## 2011-02-19 NOTE — Telephone Encounter (Signed)
Pt called stating that he has a sore throat and he went to his PCP who gave him some medication but it is still not better.  Per Dr Donnald Garre, sore throat is not r/t tarceva and he needs to contact PCP again.  Pt verbalized understanding.  SLJ

## 2011-05-24 ENCOUNTER — Telehealth: Payer: Self-pay | Admitting: *Deleted

## 2011-05-24 NOTE — Telephone Encounter (Signed)
called patient to inform the patient of tne new date and time on 05-30-2011 starting at 1:45pm

## 2011-05-29 ENCOUNTER — Telehealth: Payer: Self-pay | Admitting: *Deleted

## 2011-05-29 NOTE — Telephone Encounter (Signed)
confirmed appointment over the phone the new date and time on 05-30-2011 starting at 1:45pm

## 2011-05-29 NOTE — Telephone Encounter (Signed)
left message to inform the patient of the new date and time on 05-30-2011 starting at 1:45pm labs

## 2011-05-30 ENCOUNTER — Telehealth: Payer: Self-pay | Admitting: *Deleted

## 2011-05-30 ENCOUNTER — Ambulatory Visit (HOSPITAL_BASED_OUTPATIENT_CLINIC_OR_DEPARTMENT_OTHER): Payer: 59 | Admitting: Physician Assistant

## 2011-05-30 ENCOUNTER — Encounter: Payer: Self-pay | Admitting: Physician Assistant

## 2011-05-30 ENCOUNTER — Other Ambulatory Visit (HOSPITAL_BASED_OUTPATIENT_CLINIC_OR_DEPARTMENT_OTHER): Payer: 59 | Admitting: Lab

## 2011-05-30 VITALS — BP 118/72 | HR 69 | Temp 98.9°F | Ht 60.75 in | Wt 131.1 lb

## 2011-05-30 DIAGNOSIS — G4700 Insomnia, unspecified: Secondary | ICD-10-CM

## 2011-05-30 DIAGNOSIS — C50919 Malignant neoplasm of unspecified site of unspecified female breast: Secondary | ICD-10-CM

## 2011-05-30 DIAGNOSIS — F430 Acute stress reaction: Secondary | ICD-10-CM

## 2011-05-30 DIAGNOSIS — R232 Flushing: Secondary | ICD-10-CM

## 2011-05-30 LAB — COMPREHENSIVE METABOLIC PANEL
ALT: 14 U/L (ref 0–35)
AST: 19 U/L (ref 0–37)
Albumin: 4.2 g/dL (ref 3.5–5.2)
Alkaline Phosphatase: 61 U/L (ref 39–117)
BUN: 12 mg/dL (ref 6–23)
CO2: 30 mEq/L (ref 19–32)
Calcium: 10.6 mg/dL — ABNORMAL HIGH (ref 8.4–10.5)
Chloride: 99 mEq/L (ref 96–112)
Creatinine, Ser: 0.91 mg/dL (ref 0.50–1.10)
Glucose, Bld: 109 mg/dL — ABNORMAL HIGH (ref 70–99)
Potassium: 3.6 mEq/L (ref 3.5–5.3)
Sodium: 138 mEq/L (ref 135–145)
Total Bilirubin: 0.5 mg/dL (ref 0.3–1.2)
Total Protein: 7.1 g/dL (ref 6.0–8.3)

## 2011-05-30 LAB — CBC WITH DIFFERENTIAL/PLATELET
BASO%: 0.3 % (ref 0.0–2.0)
Basophils Absolute: 0 10*3/uL (ref 0.0–0.1)
EOS%: 1.5 % (ref 0.0–7.0)
Eosinophils Absolute: 0.1 10*3/uL (ref 0.0–0.5)
HCT: 40.3 % (ref 34.8–46.6)
HGB: 13.5 g/dL (ref 11.6–15.9)
LYMPH%: 37.6 % (ref 14.0–49.7)
MCH: 30.5 pg (ref 25.1–34.0)
MCHC: 33.5 g/dL (ref 31.5–36.0)
MCV: 91 fL (ref 79.5–101.0)
MONO#: 0.5 10*3/uL (ref 0.1–0.9)
MONO%: 8.3 % (ref 0.0–14.0)
NEUT#: 3.4 10*3/uL (ref 1.5–6.5)
NEUT%: 52.3 % (ref 38.4–76.8)
Platelets: 324 10*3/uL (ref 145–400)
RBC: 4.43 10*6/uL (ref 3.70–5.45)
RDW: 12.4 % (ref 11.2–14.5)
WBC: 6.5 10*3/uL (ref 3.9–10.3)
lymph#: 2.5 10*3/uL (ref 0.9–3.3)

## 2011-05-30 LAB — CANCER ANTIGEN 27.29: CA 27.29: 28 U/mL (ref 0–39)

## 2011-05-30 LAB — LACTATE DEHYDROGENASE: LDH: 163 U/L (ref 94–250)

## 2011-05-30 MED ORDER — LORAZEPAM 0.5 MG PO TABS: 0.5000 mg | ORAL_TABLET | Freq: Four times a day (QID) | ORAL | Status: AC | PRN

## 2011-05-30 NOTE — Progress Notes (Signed)
Hematology and Oncology Follow Up Visit  Gudrun Axe [id number] 30-May-1945 66 y.o. 05/30/2011    HPI: Mrs. Detamore is a 66 year old [address] [place] woman with a history of an ER/PR positive (99/7%), HER-2 negative, infiltrating ductal carcinoma of the left breast diagnosed November of 2010, treated with neoadjuvant Femara 2.5 mg by mouth daily, s/p left lumpectomy with sentinel node dissection on 07/12/2009, radiation therapy to the remaining left breast tissue completed October 2011, continued Femara 2.5 mg by mouth daily since.   Interim History:   Mrs. Lieurance is seen today pertaining to her history of a locally advanced ER/PR positive left breast carcinoma for which she received neoadjuvant Femara followed by a left lumpectomy with sentinel node dissection, radiation therapy, and ongoing Femara therapy. Physically she is feeling well, emotionally she is a bit fragile, her husband of 46 years died in late May 06, 2013for metastatic lung carcinoma. She denies any fevers, chills, night sweats, no shortness of breath or chest pain issues. No nausea, emesis, diarrhea, or constipation issues. She had her annual mammogram performed at [facility] in January 2013, all was well. She denies a diffuse bone pain, appetite currently is appropriately decreased but, no evidence of early satiety. She does have difficulty falling asleep recently.  A detailed review of systems is otherwise noncontributory as noted below.  Review of Systems: Constitutional:  no weight loss, fever, night sweats, feels well and fatigued Eyes: No complaints ENT: No complaints Cardiovascular: no chest pain or dyspnea on exertion Respiratory: no cough, shortness of breath, or wheezing Neurological: no TIA or stroke symptoms Dermatological: negative Gastrointestinal: no abdominal pain, change in bowel habits, or black or bloody stools Genito-Urinary: no dysuria, trouble voiding, or hematuria Hematological and Lymphatic:  negative Breast: negative Musculoskeletal: negative Remaining ROS negative.   Medications:   I have reviewed the patient's current medications.  Current Outpatient Prescriptions  Medication Sig Dispense Refill  . atenolol-chlorthalidone (TENORETIC) 50-25 MG per tablet Take 1 tablet by mouth daily.        . calcium-vitamin D (OSCAL WITH D) 500-200 MG-UNIT per tablet Take 1 tablet by mouth daily.        . diclofenac (VOLTAREN) 50 MG EC tablet Take 50 mg by mouth 2 (two) times daily.        Marland Kitchen FOLIC ACID PO Take by mouth daily.        Marland Kitchen letrozole (FEMARA) 2.5 MG tablet Take 2.5 mg by mouth daily.        . Multiple Vitamin (MULTIVITAMIN PO) Take by mouth daily.        . prochlorperazine (COMPAZINE) 10 MG tablet       . LORazepam (ATIVAN) 0.5 MG tablet Take 1 tablet (0.5 mg total) by mouth every 6 (six) hours as needed for anxiety.  60 tablet  1    Allergies:  Allergies  Allergen Reactions  . Aspirin     nausea    Physical Exam: Filed Vitals:   05/30/11 1442  BP: 118/72  Pulse: 69  Temp: 98.9 F (37.2 C)    Body mass index is 24.98 kg/(m^2). Weight: 131 lbs. HEENT:  Sclerae anicteric, conjunctivae pink.  Oropharynx clear.  No mucositis or candidiasis.   Nodes:  No cervical, supraclavicular, or axillary lymphadenopathy palpated.  Breast Exam: The left breast was examined, lumpectomy scar is benign without any dominant masses no nipple inversion or discharge. Axilla is clear. Hyperpigmentation of the skin from radiation is still present but no erythema. The right breast  is free of masses, skin changes nipple inversion or discharge axilla is clear. Lungs:  Clear to auscultation bilaterally.  No crackles, rhonchi, or wheezes.   Heart:  Regular rate and rhythm.   Abdomen:  Soft, nontender.  Positive bowel sounds.  No organomegaly or masses palpated.   Musculoskeletal:  No focal spinal tenderness to palpation.  Extremities:  Benign.  No peripheral edema or cyanosis.   Skin:  Benign.    Neuro:  Nonfocal, alert and oriented x 3.   Lab Results: Lab Results  Component Value Date   WBC 6.5 05/30/2011   HGB 13.5 05/30/2011   HCT 40.3 05/30/2011   MCV 91.0 05/30/2011   PLT 324 05/30/2011   NEUTROABS 3.4 05/30/2011     Chemistry      Component Value Date/Time   NA 141 11/22/2010 1143   NA 141 11/22/2010 1143   NA 141 11/22/2010 1143   K 3.7 11/22/2010 1143   K 3.7 11/22/2010 1143   K 3.7 11/22/2010 1143   CL 104 11/22/2010 1143   CL 104 11/22/2010 1143   CL 104 11/22/2010 1143   CO2 25 11/22/2010 1143   CO2 25 11/22/2010 1143   CO2 25 11/22/2010 1143   BUN 12 11/22/2010 1143   BUN 12 11/22/2010 1143   BUN 12 11/22/2010 1143   CREATININE 0.82 11/22/2010 1143   CREATININE 0.82 11/22/2010 1143   CREATININE 0.82 11/22/2010 1143      Component Value Date/Time   CALCIUM 9.9 11/22/2010 1143   CALCIUM 9.9 11/22/2010 1143   CALCIUM 9.9 11/22/2010 1143   ALKPHOS 71 11/22/2010 1143   ALKPHOS 71 11/22/2010 1143   ALKPHOS 71 11/22/2010 1143   AST 20 11/22/2010 1143   AST 20 11/22/2010 1143   AST 20 11/22/2010 1143   ALT 20 11/22/2010 1143   ALT 20 11/22/2010 1143   ALT 20 11/22/2010 1143   BILITOT 0.5 11/22/2010 1143   BILITOT 0.5 11/22/2010 1143   BILITOT 0.5 11/22/2010 1143      Lab Results  Component Value Date   LABCA2 34 02/02/2009    Assessment:  Mrs. Lewan is a 66 year old [address] [place] woman with a history of an ER/PR positive (99/7%), HER-2 negative, infiltrating ductal carcinoma of the left breast diagnosed November of 2010, treated with neoadjuvant Femara 2.5 mg by mouth daily, s/p left lumpectomy with sentinel node dissection on 07/12/2009, radiation therapy to the remaining left breast tissue completed October 2011, continued Femara 2.5 mg by mouth daily since.  No evidence of recurrent or metastatic disease.  Case to be reviewed with Dr. Donnie Coffin upon his return.    Plan:  Mrs. Surita will continue on Femara 2.5 mg by mouth daily. Refill prescription was not required  today. I have provided her a prescription for lorazepam 0.5 mg tablets for both stress and to help her sleep. We will plan on seeing her back in 6 months time with appropriate pre-labs to include a CBC, serum chemistry, LDH, CA 27-29. She knows to contact us any time prior to her six-month followup if the need should arise.  This plan was reviewed with the patient, who voices understanding and agreement.  She knows to call with any changes or problems.    Zavannah Deblois T, PA-C 05/30/2011

## 2011-05-30 NOTE — Telephone Encounter (Signed)
gave patient appointment for 12-05-2011 starting at 1:15 pm printed out calendar and gave to the patient

## 2011-08-02 ENCOUNTER — Other Ambulatory Visit: Payer: Self-pay | Admitting: Nurse Practitioner

## 2011-08-02 DIAGNOSIS — C50919 Malignant neoplasm of unspecified site of unspecified female breast: Secondary | ICD-10-CM

## 2011-08-02 DIAGNOSIS — C792 Secondary malignant neoplasm of skin: Secondary | ICD-10-CM

## 2011-10-14 DIAGNOSIS — Z23 Encounter for immunization: Secondary | ICD-10-CM | POA: Diagnosis not present

## 2011-11-08 ENCOUNTER — Encounter (INDEPENDENT_AMBULATORY_CARE_PROVIDER_SITE_OTHER): Payer: Self-pay | Admitting: General Surgery

## 2011-11-08 ENCOUNTER — Ambulatory Visit (INDEPENDENT_AMBULATORY_CARE_PROVIDER_SITE_OTHER): Payer: Medicare Other | Admitting: General Surgery

## 2011-11-08 VITALS — BP 126/80 | HR 76 | Temp 97.8°F | Resp 18 | Ht 63.0 in | Wt 135.4 lb

## 2011-11-08 DIAGNOSIS — Z853 Personal history of malignant neoplasm of breast: Secondary | ICD-10-CM | POA: Diagnosis not present

## 2011-11-08 NOTE — Progress Notes (Signed)
Subjective:     Patient ID: Leslie [name], female   DOB: Dec 30, 1945, 67 y.o.   MRN: [id number]  HPI This patient follows up status post left lumpectomy and axillary dissection in 2011 for a locally advanced left breast cancer. She underwent neoadjuvant treatment and had a followup surgery. She states that she has been doing well. She is taking Femara and is followed by Dr. Donnie Coffin. She is doing self breast exam and denies any changes. She denies any headaches or weight loss.  Her only complaint is the hot flashes. She did have her annual mammogram in January of this year which was BI-RADS 2   Review of Systems     Objective:   Physical Exam She is in no acute distress and nontoxic-appearing Her right breast is normal without any suspicious masses, skin changes, or lymphadenopathy. She has a well-healed surgical scar in the upper outer quadrant from her prior lump removal Her left breast is also without any suspicious masses or lymphadenopathy. She has some skin thickening in the area of her prior radiation therapy which I think is normal.    Assessment:     History of breast cancer-doing well She seems to be doing very well in has no evidence of recurrent disease. I recommended that she continue with her anal mammograms, monthly self breast exam and clinical exam. She will continue her adjuvant therapy to followup with [place] next year or sooner as needed    Plan:     followup mammograms in January Continue monthly self breast exams We will see her back in one year for repeat evaluation.

## 2011-11-20 DIAGNOSIS — D235 Other benign neoplasm of skin of trunk: Secondary | ICD-10-CM | POA: Diagnosis not present

## 2011-11-20 DIAGNOSIS — L259 Unspecified contact dermatitis, unspecified cause: Secondary | ICD-10-CM | POA: Diagnosis not present

## 2011-12-04 ENCOUNTER — Other Ambulatory Visit: Payer: Self-pay | Admitting: *Deleted

## 2011-12-04 NOTE — Telephone Encounter (Signed)
Patient is requesting generic Femara (letrazole) instead of brand name and script had been sent as DAW. OK from collaborative nurse to use generic per Dr. Donnie Coffin.

## 2011-12-05 ENCOUNTER — Ambulatory Visit (HOSPITAL_BASED_OUTPATIENT_CLINIC_OR_DEPARTMENT_OTHER): Payer: Medicare Other | Admitting: Oncology

## 2011-12-05 ENCOUNTER — Other Ambulatory Visit (HOSPITAL_BASED_OUTPATIENT_CLINIC_OR_DEPARTMENT_OTHER): Payer: Medicare Other | Admitting: Lab

## 2011-12-05 ENCOUNTER — Telehealth: Payer: Self-pay | Admitting: *Deleted

## 2011-12-05 VITALS — BP 133/78 | HR 81 | Temp 98.3°F | Resp 20 | Ht 63.0 in | Wt 140.1 lb

## 2011-12-05 DIAGNOSIS — C50919 Malignant neoplasm of unspecified site of unspecified female breast: Secondary | ICD-10-CM

## 2011-12-05 DIAGNOSIS — C50419 Malignant neoplasm of upper-outer quadrant of unspecified female breast: Secondary | ICD-10-CM | POA: Diagnosis not present

## 2011-12-05 DIAGNOSIS — G4700 Insomnia, unspecified: Secondary | ICD-10-CM | POA: Diagnosis not present

## 2011-12-05 DIAGNOSIS — N951 Menopausal and female climacteric states: Secondary | ICD-10-CM

## 2011-12-05 DIAGNOSIS — R232 Flushing: Secondary | ICD-10-CM

## 2011-12-05 DIAGNOSIS — M8580 Other specified disorders of bone density and structure, unspecified site: Secondary | ICD-10-CM

## 2011-12-05 LAB — CBC WITH DIFFERENTIAL/PLATELET
BASO%: 0.7 % (ref 0.0–2.0)
Basophils Absolute: 0 10*3/uL (ref 0.0–0.1)
EOS%: 1.5 % (ref 0.0–7.0)
Eosinophils Absolute: 0.1 10*3/uL (ref 0.0–0.5)
HCT: 42.5 % (ref 34.8–46.6)
HGB: 14.4 g/dL (ref 11.6–15.9)
LYMPH%: 44.3 % (ref 14.0–49.7)
MCH: 31.6 pg (ref 25.1–34.0)
MCHC: 33.8 g/dL (ref 31.5–36.0)
MCV: 93.5 fL (ref 79.5–101.0)
MONO#: 0.5 10*3/uL (ref 0.1–0.9)
MONO%: 9.9 % (ref 0.0–14.0)
NEUT#: 2.3 10*3/uL (ref 1.5–6.5)
NEUT%: 43.6 % (ref 38.4–76.8)
Platelets: 257 10*3/uL (ref 145–400)
RBC: 4.55 10*6/uL (ref 3.70–5.45)
RDW: 13.6 % (ref 11.2–14.5)
WBC: 5.3 10*3/uL (ref 3.9–10.3)
lymph#: 2.3 10*3/uL (ref 0.9–3.3)

## 2011-12-05 LAB — COMPREHENSIVE METABOLIC PANEL (CC13)
ALT: 27 U/L (ref 0–55)
AST: 22 U/L (ref 5–34)
Albumin: 3.9 g/dL (ref 3.5–5.0)
Alkaline Phosphatase: 92 U/L (ref 40–150)
BUN: 10 mg/dL (ref 7.0–26.0)
CO2: 30 mEq/L — ABNORMAL HIGH (ref 22–29)
Calcium: 10 mg/dL (ref 8.4–10.4)
Chloride: 104 mEq/L (ref 98–107)
Creatinine: 0.9 mg/dL (ref 0.6–1.1)
Glucose: 107 mg/dl — ABNORMAL HIGH (ref 70–99)
Potassium: 3.3 mEq/L — ABNORMAL LOW (ref 3.5–5.1)
Sodium: 142 mEq/L (ref 136–145)
Total Bilirubin: 0.32 mg/dL (ref 0.20–1.20)
Total Protein: 7.5 g/dL (ref 6.4–8.3)

## 2011-12-05 LAB — LACTATE DEHYDROGENASE (CC13): LDH: 182 U/L (ref 125–245)

## 2011-12-05 MED ORDER — LETROZOLE 2.5 MG PO TABS: 2.5000 mg | ORAL_TABLET | Freq: Every day | ORAL | Status: DC

## 2011-12-05 NOTE — Telephone Encounter (Signed)
Gave patient appointment for 06-03-2012 starting at 11:00am

## 2011-12-05 NOTE — Progress Notes (Signed)
Hematology and Oncology Follow Up Visit  Leslie [name] [id number] 27-Aug-1945 66 y.o. 12/05/2011    HPI: Leslie [name] is a 66 year old [address] [place] woman with a history of an ER/PR positive (99/7%), HER-2 negative, infiltrating ductal carcinoma of the left breast diagnosed November of 2010, treated with neoadjuvant Femara 2.5 mg by mouth daily, s/p left lumpectomy with sentinel node dissection on 07/12/2009, radiation therapy to the remaining left breast tissue completed October 2011, continued Femara 2.5 mg by mouth daily since.   Interim History:   Leslie [name] is seen today pertaining to her history of a locally advanced ER/PR positive left breast carcinoma for which she received neoadjuvant Femara followed by a left lumpectomy with sentinel node dissection, radiation therapy, and ongoing Femara therapy. Physically she is feeling well, emotionally she is a bit fragile, her husband of 46 years died in late May 08, 2013for metastatic lung carcinoma. She denies any fevers, chills, night sweats, no shortness of breath or chest pain issues. No nausea, emesis, diarrhea, or constipation issues. She continues to have her annual mammogram and tolerates her medication well. A detailed review of systems is otherwise noncontributory as noted below.  Review of Systems: Constitutional:  no weight loss, fever, night sweats, feels well and fatigued Eyes: No complaints ENT: No complaints Cardiovascular: no chest pain or dyspnea on exertion Respiratory: no cough, shortness of breath, or wheezing Neurological: no TIA or stroke symptoms Dermatological: negative Gastrointestinal: no abdominal pain, change in bowel habits, or black or bloody stools Genito-Urinary: no dysuria, trouble voiding, or hematuria Hematological and Lymphatic: negative Breast: negative Musculoskeletal: negative Remaining ROS negative.   Medications:   I have reviewed the patient's current medications.  Current  Outpatient Prescriptions  Medication Sig Dispense Refill  . atenolol-chlorthalidone (TENORETIC) 50-25 MG per tablet Take 1 tablet by mouth daily.        Bronson Ing Products (PERIDIN-C PO) Take by mouth daily.      . Evening Primrose topical oil Apply topically as needed.      Marland Kitchen FEMARA 2.5 MG tablet TAKE 1 TABLET EVERY DAY  90 tablet  3  . FOLIC ACID PO Take by mouth daily.        . mirtazapine (REMERON) 15 MG tablet Take 15 mg by mouth at bedtime.      . Multiple Vitamin (MULTIVITAMIN PO) Take by mouth daily.        . calcium-vitamin D (OSCAL WITH D) 500-200 MG-UNIT per tablet Take 1 tablet by mouth daily.          Allergies:  Allergies  Allergen Reactions  . Aspirin     nausea    Physical Exam: Filed Vitals:   12/05/11 1140  BP: 133/78  Pulse: 81  Temp: 98.3 F (36.8 C)  Resp: 20    Body mass index is 24.82 kg/(m^2). Weight: 131 lbs. HEENT:  Sclerae anicteric, conjunctivae pink.  Oropharynx clear.  No mucositis or candidiasis.   Nodes:  No cervical, supraclavicular, or axillary lymphadenopathy palpated.  Breast Exam: The left breast was examined, lumpectomy scar is benign without any dominant masses no nipple inversion or discharge. Axilla is clear. Hyperpigmentation of the skin from radiation is still present but no erythema. The right breast is free of masses, skin changes nipple inversion or discharge axilla is clear. Lungs:  Clear to auscultation bilaterally.  No crackles, rhonchi, or wheezes.   Heart:  Regular rate and rhythm.   Abdomen:  Soft, nontender.  Positive bowel sounds.  No organomegaly or masses palpated.   Musculoskeletal:  No focal spinal tenderness to palpation.  Extremities:  Benign.  No peripheral edema or cyanosis.   Skin:  Benign.   Neuro:  Nonfocal, alert and oriented x 3.   Lab Results: Lab Results  Component Value Date   WBC 5.3 12/05/2011   HGB 14.4 12/05/2011   HCT 42.5 12/05/2011   MCV 93.5 12/05/2011   PLT 257 12/05/2011   NEUTROABS  2.3 12/05/2011     Chemistry      Component Value Date/Time   NA 138 05/30/2011 1405   K 3.6 05/30/2011 1405   CL 99 05/30/2011 1405   CO2 30 05/30/2011 1405   BUN 12 05/30/2011 1405   CREATININE 0.91 05/30/2011 1405      Component Value Date/Time   CALCIUM 10.6* 05/30/2011 1405   ALKPHOS 61 05/30/2011 1405   AST 19 05/30/2011 1405   ALT 14 05/30/2011 1405   BILITOT 0.5 05/30/2011 1405      Lab Results  Component Value Date   LABCA2 28 05/30/2011    Assessment:  Leslie [name] is a 66 year old [address] [place] woman with a history of an ER/PR positive (99/7%), HER-2 negative, infiltrating ductal carcinoma of the left breast diagnosed November of 2010, treated with neoadjuvant Femara 2.5 mg by mouth daily, s/p left lumpectomy with sentinel node dissection on 07/12/2009, radiation therapy to the remaining left breast tissue completed October 2011, continued Femara 2.5 mg by mouth daily since.  No evidence of recurrent or metastatic disease.      Plan:  Leslie [name] will continue on Femara 2.5 mg by mouth daily. Refill prescription was not required today. I have provided her a prescription for lorazepam 0.5 mg tablets for both stress and to help her sleep. We will plan on seeing her back in 6 months time with appropriate pre-labs to include a CBC, serum chemistry, LDH, CA 27-29. She knows to contact us any time prior to her six-month followup if the need should arise.  This plan was reviewed with the patient, who voices understanding and agreement.  She knows to call with any changes or problems.    Pierce Crane, MD 12/05/2011

## 2011-12-06 LAB — CANCER ANTIGEN 27.29: CA 27.29: 33 U/mL (ref 0–39)

## 2011-12-24 DIAGNOSIS — C50919 Malignant neoplasm of unspecified site of unspecified female breast: Secondary | ICD-10-CM | POA: Diagnosis not present

## 2011-12-24 DIAGNOSIS — Z0000 Encounter for general adult medical examination without abnormal findings: Secondary | ICD-10-CM | POA: Diagnosis not present

## 2011-12-24 DIAGNOSIS — Z1331 Encounter for screening for depression: Secondary | ICD-10-CM | POA: Diagnosis not present

## 2011-12-24 DIAGNOSIS — I10 Essential (primary) hypertension: Secondary | ICD-10-CM | POA: Diagnosis not present

## 2011-12-24 DIAGNOSIS — F17200 Nicotine dependence, unspecified, uncomplicated: Secondary | ICD-10-CM | POA: Diagnosis not present

## 2011-12-24 DIAGNOSIS — Z01419 Encounter for gynecological examination (general) (routine) without abnormal findings: Secondary | ICD-10-CM | POA: Diagnosis not present

## 2011-12-24 DIAGNOSIS — Z23 Encounter for immunization: Secondary | ICD-10-CM | POA: Diagnosis not present

## 2012-02-10 ENCOUNTER — Encounter: Payer: Self-pay | Admitting: *Deleted

## 2012-02-10 DIAGNOSIS — Z853 Personal history of malignant neoplasm of breast: Secondary | ICD-10-CM | POA: Diagnosis not present

## 2012-02-10 NOTE — Progress Notes (Signed)
RECEIVED A FAX FROM [facility] CONCERNING A PRIOR AUTHORIZATION FOR FEMARA. THIS REQUEST WAS PLACED IN THE MANAGED CARE BIN.

## 2012-02-18 DIAGNOSIS — N951 Menopausal and female climacteric states: Secondary | ICD-10-CM | POA: Diagnosis not present

## 2012-02-25 DIAGNOSIS — H2510 Age-related nuclear cataract, unspecified eye: Secondary | ICD-10-CM | POA: Diagnosis not present

## 2012-02-25 DIAGNOSIS — H25019 Cortical age-related cataract, unspecified eye: Secondary | ICD-10-CM | POA: Diagnosis not present

## 2012-03-20 NOTE — Progress Notes (Signed)
02/14/12 THEY DENIED HER FEMARA AND IT WILL HAVE TO BE APPEALED. I SPOKE TO Ceclia AND TOLD HER I WOULD APPEAL IT.  03/02/12 DID A VERBAL APPEAL AS WE HAD NOT HEARD BACK.  03/04/12 THEY APPROVED HER FEMARA AUTH # IS [id number] AND IS VALID FROM 02/07/12 UNTIL 01/20/13.

## 2012-04-11 ENCOUNTER — Encounter: Payer: Self-pay | Admitting: Oncology

## 2012-04-11 ENCOUNTER — Telehealth: Payer: Self-pay | Admitting: *Deleted

## 2012-04-11 NOTE — Telephone Encounter (Signed)
Confirmed 06/03/12 appt w/ pt.  Mailed letter & calendar to pt.

## 2012-04-21 ENCOUNTER — Ambulatory Visit (INDEPENDENT_AMBULATORY_CARE_PROVIDER_SITE_OTHER): Payer: Medicare Other | Admitting: General Surgery

## 2012-04-21 ENCOUNTER — Encounter (INDEPENDENT_AMBULATORY_CARE_PROVIDER_SITE_OTHER): Payer: Self-pay | Admitting: General Surgery

## 2012-04-21 VITALS — BP 126/82 | HR 68 | Temp 97.5°F | Resp 16 | Ht 63.75 in | Wt 137.2 lb

## 2012-04-21 DIAGNOSIS — Z853 Personal history of malignant neoplasm of breast: Secondary | ICD-10-CM

## 2012-04-21 DIAGNOSIS — N644 Mastodynia: Secondary | ICD-10-CM | POA: Diagnosis not present

## 2012-04-21 NOTE — Progress Notes (Signed)
Subjective:     Patient ID: Leslie [name], female   DOB: 1945/02/12, 67 y.o.   MRN: [id number]  HPI This patient follows up for check of her left breast. She says that she has had some left breast discomfort with pain even rubbing on her bra as well as a small amount of yellow nipple discharge. She says that this has happened on 2 occasions which was small in amount the size of a pinhead. She says that she has noticed some fullness in the area of concern but no other suspicious masses   Review of Systems     Objective:   Physical Exam Her right breast is normal on exam with a well-healed surgical scar in the upper outer quadrant. There is no suspicious masses or lymphadenopathy Her left breast actually seems to be pretty normal on exam as well. Her lumpectomy scar is well-healed she does have some tenderness just inferior to her incision appeared do not see any suspicious lesions and I do not palpate any suspicious masses in the area. No lymphadenopathy no nipple discharge today     Assessment:     Left breast pain and history of breast cancer Left breast nipple discharge I do not appreciate any suspicious lesions on exam and she had a normal mammogram in January of this year however given her tenderness and nipple discharge we will set her up for an ultrasound of left breast. Site did not think that this is any evidence of recurrence but this could be a blocked duct. We will set her up for an ultrasound and I will see her back after the ultrasound. If this is normal then we will go ahead and continue with self breast exam and I'll see her back in 3 months for repeat exam     Plan:     Left breast ultrasound in follow up after this

## 2012-04-22 ENCOUNTER — Telehealth (INDEPENDENT_AMBULATORY_CARE_PROVIDER_SITE_OTHER): Payer: Self-pay | Admitting: General Surgery

## 2012-04-22 NOTE — Telephone Encounter (Signed)
Patient is aware of appt for u/s for 04/30/12 at 9am

## 2012-04-30 DIAGNOSIS — Z853 Personal history of malignant neoplasm of breast: Secondary | ICD-10-CM | POA: Diagnosis not present

## 2012-05-27 ENCOUNTER — Telehealth: Payer: Self-pay | Admitting: *Deleted

## 2012-05-27 NOTE — Telephone Encounter (Signed)
sw pt asked if she could come in the am on 06/03/12? pt said yes. gv appt for 06/03/12 at 11:15am..the patient is aware...td

## 2012-05-27 NOTE — Telephone Encounter (Signed)
i also mailed a cal...td

## 2012-05-29 ENCOUNTER — Telehealth: Payer: Self-pay | Admitting: *Deleted

## 2012-05-29 NOTE — Telephone Encounter (Signed)
Called and spoke with to reschedule her appt. To 0900am on 06/03/12 with Bernell List.  Then will become Dr. Darnelle Catalan.

## 2012-06-03 ENCOUNTER — Telehealth: Payer: Self-pay | Admitting: *Deleted

## 2012-06-03 ENCOUNTER — Ambulatory Visit: Payer: Medicare Other | Admitting: Family

## 2012-06-03 ENCOUNTER — Ambulatory Visit (HOSPITAL_BASED_OUTPATIENT_CLINIC_OR_DEPARTMENT_OTHER): Payer: Medicare Other | Admitting: Lab

## 2012-06-03 ENCOUNTER — Ambulatory Visit: Payer: Medicare Other | Admitting: Oncology

## 2012-06-03 ENCOUNTER — Ambulatory Visit (HOSPITAL_BASED_OUTPATIENT_CLINIC_OR_DEPARTMENT_OTHER): Payer: Medicare Other | Admitting: Family

## 2012-06-03 ENCOUNTER — Other Ambulatory Visit: Payer: Medicare Other | Admitting: Lab

## 2012-06-03 ENCOUNTER — Encounter: Payer: Self-pay | Admitting: Family

## 2012-06-03 VITALS — BP 120/69 | HR 68 | Temp 98.8°F | Resp 20 | Ht 63.0 in | Wt 137.9 lb

## 2012-06-03 DIAGNOSIS — C50912 Malignant neoplasm of unspecified site of left female breast: Secondary | ICD-10-CM

## 2012-06-03 DIAGNOSIS — M949 Disorder of cartilage, unspecified: Secondary | ICD-10-CM | POA: Diagnosis not present

## 2012-06-03 DIAGNOSIS — F17200 Nicotine dependence, unspecified, uncomplicated: Secondary | ICD-10-CM

## 2012-06-03 DIAGNOSIS — M8580 Other specified disorders of bone density and structure, unspecified site: Secondary | ICD-10-CM

## 2012-06-03 DIAGNOSIS — N6459 Other signs and symptoms in breast: Secondary | ICD-10-CM

## 2012-06-03 DIAGNOSIS — Z853 Personal history of malignant neoplasm of breast: Secondary | ICD-10-CM | POA: Diagnosis not present

## 2012-06-03 DIAGNOSIS — C773 Secondary and unspecified malignant neoplasm of axilla and upper limb lymph nodes: Secondary | ICD-10-CM | POA: Diagnosis not present

## 2012-06-03 DIAGNOSIS — C50919 Malignant neoplasm of unspecified site of unspecified female breast: Secondary | ICD-10-CM | POA: Diagnosis not present

## 2012-06-03 DIAGNOSIS — M899 Disorder of bone, unspecified: Secondary | ICD-10-CM | POA: Diagnosis not present

## 2012-06-03 LAB — COMPREHENSIVE METABOLIC PANEL (CC13)
ALT: 21 U/L (ref 0–55)
AST: 19 U/L (ref 5–34)
Albumin: 4 g/dL (ref 3.5–5.0)
Alkaline Phosphatase: 78 U/L (ref 40–150)
BUN: 9.4 mg/dL (ref 7.0–26.0)
CO2: 29 mEq/L (ref 22–29)
Calcium: 10 mg/dL (ref 8.4–10.4)
Chloride: 102 mEq/L (ref 98–107)
Creatinine: 0.9 mg/dL (ref 0.6–1.1)
Glucose: 101 mg/dl — ABNORMAL HIGH (ref 70–99)
Potassium: 3.6 mEq/L (ref 3.5–5.1)
Sodium: 141 mEq/L (ref 136–145)
Total Bilirubin: 0.5 mg/dL (ref 0.20–1.20)
Total Protein: 7.8 g/dL (ref 6.4–8.3)

## 2012-06-03 LAB — CBC WITH DIFFERENTIAL/PLATELET
BASO%: 0.6 % (ref 0.0–2.0)
Basophils Absolute: 0 10*3/uL (ref 0.0–0.1)
EOS%: 1.1 % (ref 0.0–7.0)
Eosinophils Absolute: 0.1 10*3/uL (ref 0.0–0.5)
HCT: 43.5 % (ref 34.8–46.6)
HGB: 14.6 g/dL (ref 11.6–15.9)
LYMPH%: 36.6 % (ref 14.0–49.7)
MCH: 31.6 pg (ref 25.1–34.0)
MCHC: 33.7 g/dL (ref 31.5–36.0)
MCV: 93.9 fL (ref 79.5–101.0)
MONO#: 0.8 10*3/uL (ref 0.1–0.9)
MONO%: 10.1 % (ref 0.0–14.0)
NEUT#: 3.9 10*3/uL (ref 1.5–6.5)
NEUT%: 51.6 % (ref 38.4–76.8)
Platelets: 269 10*3/uL (ref 145–400)
RBC: 4.63 10*6/uL (ref 3.70–5.45)
RDW: 12.8 % (ref 11.2–14.5)
WBC: 7.6 10*3/uL (ref 3.9–10.3)
lymph#: 2.8 10*3/uL (ref 0.9–3.3)

## 2012-06-03 LAB — LACTATE DEHYDROGENASE (CC13): LDH: 182 U/L (ref 125–245)

## 2012-06-03 MED ORDER — LETROZOLE 2.5 MG PO TABS: 2.5000 mg | ORAL_TABLET | Freq: Every day | ORAL | Status: DC

## 2012-06-03 NOTE — Progress Notes (Signed)
[facility]  Telephone:([phone] Fax:([phone]) [phone]  OFFICE PROGRESS NOTE   ID: [name]   DOB: 1945-08-14  MR#: [id number]  JYN#:[id number]   PCP: Lupita Raider, MD SU: Bertram Savin, M.D.  RAD ONC: Radene Gunning, M.D.   HISTORY OF PRESENT ILLNESS: From Dr. Pierce Crane he patient evaluation note dated 12/07/2008: "This is a pleasant 67 year old woman from [facility] referred by Dr. Freida Busman for evaluation and treatment of breast cancer. This woman has been in relatively good health.  She undergoes annual screening mammography.  Her husband is a patient of Dr. Arbutus Ped who has lung cancer.  She had a screening mammogram on 11/11/2008, which showed an abnormality.  A follow up diagnostic mammogram and right breast ultrasound was performed.  This showed a hyperdense spiculated mass 12 o'clock left breast measuring 5 x 3 x 1.5 cm.  There is an associated abnormality seen on ultrasound as well.  Biopsy was recommended with BSGI scan performed 11/23/2008 showed an uptake with a 3 cm of intense isotope activity.  An ultrasound-guided biopsy of both the breast mass in the left breast as well as left axilla was performed on 11/23/2008, both of which show invasive mammary carcinoma.  Prognostic panel showed this to be ER positive at 99%, PR positive at 7%, proliferative index 17%, HER-2 was negative with ratio of 1.2.  An E-cadherin test was performed, which was negative suggestive of lobular phenotype.  The patient has subsequently been referred for a neoadjuvant therapy.  Of note is she had an MRI scan on 12/01/2008, which showed a 4.9 x 3.5 x 2.9 cm area of lobular carcinoma 12 o'clock position left breast.  Axillary lymph nodes were also seen measuring 1.6 x 1.1 cm.  Multiple right breast cysts were also seen as well."  Her subsequent history is as detailed below.   INTERVAL HISTORY: Dr. Darnelle [name] and I saw Leslie [name]  today for follow up of invasive lobular carcinoma of  the left breast..  The patient was last seen by Dr. Donnie Coffin on 12/05/2011.   Since her last office visit, the patient has been doing relatively well, except for the development of left breast leakage and tenderness that first happened in 03/2012 and again a week ago.  The patient describes the breast exudate as a yellow fluid that is not malodorous.  She states that she had a right breast ultrasound at Dr. Cherlyn Labella office in 03/2012 windows breast leakage first happened and that she was told there were not any suspicious findings.  We do not have a copy of the ultrasound report in our records.  Mrs. Waldrop is establishing herself with Dr. Darrall Dears service today.   REVIEW OF SYSTEMS: A 10 point review of systems was completed and is negative except hot flashes, night sweats, left breast tenderness, and left breast yellow colored fluid leakage.  Interestingly, the patient states that her hot flashes and night sweats have improved with the brand name antiestrogen therapy of Femara and her symptomatology was worse on generic antiestrogen brand of Letrozole.  The patient denies any other symptomatology   PAST MEDICAL HISTORY: Past Medical History  Diagnosis Date  . Hypertension   . Breast cancer 2011    Left Breast Cancer  . Depression   . Benign breast cyst in female     Right breast    PAST SURGICAL HISTORY: Past Surgical History  Procedure Laterality Date  . Tubal ligation    . Abdominal hysterectomy    .  Breast lumpectomy Left 11/23/2008  . Breast cyst excision Right   Includes hysterectomy in her 30s secondary to tubal pregnancy, ovaries were intact, she has a history of right breast cyst and associated with right breast biopsy   FAMILY HISTORY Family History  Problem Relation Age of Onset  . Hypertension Mother   . Seizures Mother   . Heart disease Father   . Hypertension Sister   No history of breast or ovarian cancer in the family.  Father is alive and well at 99, has a history  of bad heart, mother is deceased age 94.  She has five sisters, two brothers, all of whom in good health.   GYNECOLOGIC HISTORY: G4, P3, menarche age 16, hysterectomy in her 30s.  She has been on estrogen patch, which she is still on currently.  Three adult children ages 35, 109, and 50.  One lives in [place], one in [place], one in [place].  She has one grandchild.   SOCIAL HISTORY: The patient is now widowed, but was married to her husband Pressley for 45 years.  Her husband had a history of lung cancer.  The patient has 3 adult sons that live in [place], [place], and [place] respectively.  The patient has one granddaughter.  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  In her spare time the patient enjoys traveling, listening to music, exercising, and playing computer games.   ADVANCED DIRECTIVES: Not on file   HEALTH MAINTENANCE: History  Substance Use Topics  . Smoking status: Current Every Day Smoker -- 0.50 packs/day  . Smokeless tobacco: Never Used     Comment: 1/2 pack per day  . Alcohol Use: Yes     Comment: glass of wine maybe every 4 months    Colonoscopy: Not on file PAP: The last Pap smear that we have on record was in 11/2010 from [facility]. Bone density: The patient states she had a bone density examination in 03/2012 by [facility]-we have not received a copy of the bone density scan.   Lipid panel: Not on file   Allergies  Allergen Reactions  . Aspirin     nausea    Current Outpatient Prescriptions  Medication Sig Dispense Refill  . atenolol-chlorthalidone (TENORETIC) 50-25 MG per tablet Take 1 tablet by mouth daily.        Bronson Ing Products (PERIDIN-C PO) Take by mouth daily.      . calcium-vitamin D (OSCAL WITH D) 500-200 MG-UNIT per tablet Take 1 tablet by mouth daily.        . Evening Primrose topical oil Apply topically as needed.      Marland Kitchen FOLIC ACID PO Take by mouth daily.        Marland Kitchen letrozole  (FEMARA) 2.5 MG tablet Take 1 tablet (2.5 mg total) by mouth daily.  90 tablet  7  . mirtazapine (REMERON) 15 MG tablet Take 15 mg by mouth at bedtime.      . Multiple Vitamin (MULTIVITAMIN PO) Take by mouth daily.        Marland Kitchen ZOSTAVAX 13244 UNT/0.65ML injection        No current facility-administered medications for this visit.    OBJECTIVE: Filed Vitals:   06/03/12 0916  BP: 120/69  Pulse: 68  Temp: 98.8 F (37.1 C)  Resp: 20     Body mass index is 24.43 kg/(m^2).      ECOG FS: 1 - Symptomatic but completely ambulatory  General appearance: Alert,  cooperative, well nourished, thin frame,  no apparent distress Head: Normocephalic, without obvious abnormality, atraumatic Eyes:  Arcus senilis , PERRLA, EOMI Nose: Nares, septum and mucosa are normal, no drainage or sinus tenderness Neck: No adenopathy, supple, symmetrical, trachea midline, thyroid not enlarged, no tenderness Resp: Clear to auscultation bilaterally Cardio: Regular rate and rhythm, S1, S2 normal, no murmur, click, rub or gallop Breasts:  pendulous bilaterally , left breast/left axillary area and right breast  have well-healed surgical scars,  no left breast nipple leakage at the time of examination, no lymphadenopathy, no nipple inversion, no axilla fullness GI: Soft,  not distended, non-tender, hypoactive bowel sounds, no organomegaly Extremities: Extremities normal, atraumatic, no cyanosis or edema Lymph nodes: Cervical, supraclavicular, and axillary nodes normal Neurologic: Grossly normal   LAB RESULTS: Lab Results  Component Value Date   WBC 7.6 06/03/2012   NEUTROABS 3.9 06/03/2012   HGB 14.6 06/03/2012   HCT 43.5 06/03/2012   MCV 93.9 06/03/2012   PLT 269 06/03/2012      Chemistry      Component Value Date/Time   NA 141 06/03/2012 1106   NA 138 05/30/2011 1405   K 3.6 06/03/2012 1106   K 3.6 05/30/2011 1405   CL 102 06/03/2012 1106   CL 99 05/30/2011 1405   CO2 29 06/03/2012 1106   CO2 30 05/30/2011 1405   BUN 9.4  06/03/2012 1106   BUN 12 05/30/2011 1405   CREATININE 0.9 06/03/2012 1106   CREATININE 0.91 05/30/2011 1405      Component Value Date/Time   CALCIUM 10.0 06/03/2012 1106   CALCIUM 10.6* 05/30/2011 1405   ALKPHOS 78 06/03/2012 1106   ALKPHOS 61 05/30/2011 1405   AST 19 06/03/2012 1106   AST 19 05/30/2011 1405   ALT 21 06/03/2012 1106   ALT 14 05/30/2011 1405   BILITOT 0.50 06/03/2012 1106   BILITOT 0.5 05/30/2011 1405       Lab Results  Component Value Date   LABCA2 33 12/05/2011    Urinalysis    Component Value Date/Time   COLORURINE YELLOW 08/17/2010 1105   APPEARANCEUR CLEAR 08/17/2010 1105   LABSPEC 1.017 08/17/2010 1105   PHURINE 8.0 08/17/2010 1105   GLUCOSEU NEGATIVE 08/17/2010 1105   HGBUR NEGATIVE 08/17/2010 1105   BILIRUBINUR NEGATIVE 08/17/2010 1105   KETONESUR NEGATIVE 08/17/2010 1105   PROTEINUR NEGATIVE 08/17/2010 1105   UROBILINOGEN 0.2 08/17/2010 1105   NITRITE NEGATIVE 08/17/2010 1105   LEUKOCYTESUR NEGATIVE 08/17/2010 1105    STUDIES: No results found.  ASSESSMENT: 67 y.o. [place], [place]  woman: 1.  Status post left breast needle core biopsy at the 12 o'clock position and left axillary needle core biopsy on 11/23/2008 which showed invasive mammary carcinoma, and one lymph node positive for metastatic carcinoma, ER 99%, PR 7%, Ki-67 17%, HER-2/neu by CISH no amplification with a ratio of 1.20.  2.  Status post bilateral breast MRI on 12/01/2008 which showed a 4.9 x 3.5 x 2.9 cm spiculated mass at the 12o'clock position of the left breast.  Corresponded to the biopsied invasive lobular carcinoma.  It extended anteriorly into the sub-areola region of the breast with associated skin retraction superior to the nipple.  No definite nipple invasion was identified.  No other masses or areas of enhancement suspicious for malignancy in either breast.  Asymmetrically prominent left axillary lymph nodes  were demonstrated.  The largest measured 1.6 x 1.1 cm in maximum dimensions.   Metastatic left axillary adenopathy. No abnormal internal  mammary or right axillary lymph nodes  were demonstrated.  Multiple right breast cysts.  3.  Status post neoadjuvant antiestrogen therapy with  Femara started in 12/2008 until 06/2009 at the time of her definitive surgery.  4.  Status post left breast lumpectomy with regional lymph node resection on 07/12/2009 for a stage IIA, pT1c pN1, invasive lobular carcinoma 1.5 cm with angiolymphatic invasion identified, left axillary area had extracapsular tumor extension identified, 2/8 positive lymph nodes, ER 81%, PR 0%, Ki-67 74%, HER-2/neu by CISH no amplification with a ratio of 1.25.  5. Status post Oncotype DX report dated 08/10/2009 which shows a breast recurrence score of 20% with an average rate of distant recurrence of 13%.  6.  Status post breast excision of anterior and superior left margins, medial margins and lateral margins which showed lobular neoplasia (atypical lobular hyperplasia), invasive carcinoma not identified on 08/04/2009.  7.  Status post radiation therapy from 09/26/2009-10/21/201.  8.  Left breast leakage and tenderness.  9.  Flashes and night sweats  10.  Tobacco abuse  PLAN: We plan for the patient to continue on antiestrogen therapy with Femara 2.5 mg by mouth daily until 12/2011, at which time she will have completed 5 years of antiestrogen therapy.  The patient was given a written prescription for Femara #90 with 7 refills.   The patient's last bilateral digital diagnostic mammogram on 02/10/2012 showed scattered parenchymal densities present.  The breast parenchymal pattern is stable with no new or worrisome finding in either breast.  Surgical changes are noted on the left.  Benign findings.  We will schedule her next mammogram, due in 01/2013 for her.  We will also order the patient to have a left breast ductogram to rule out papilloma or any other abnormality.    The patient states her hot flashes and night  sweats are tolerable.  Smoking cessation was discussed with the patient, and she's not ready for smoking cessation at this time.   We plan to see Mrs. Whisner again in 01/2013 after her annual mammogram, at which time we will check CBC, CMP, vitamin D level, and LDH.  All questions were answered.  The patient was encouraged to contact us in the interim with any problems, questions or concerns.   Larina Bras, NP-C 06/03/2012, 5:18 PM

## 2012-06-03 NOTE — Telephone Encounter (Signed)
appts made and printed. i also gave the pt appts d/t for [facility]...Marland KitchenMarland Kitchentd

## 2012-06-03 NOTE — Patient Instructions (Addendum)
Please contact us at [phone] if you have any questions or concerns.  Please continue to do well and enjoy life!!!  Get plenty of rest, drink plenty of water, exercise daily, eat a balanced diet.  Consider smoking cessation.

## 2012-06-04 ENCOUNTER — Telehealth: Payer: Self-pay | Admitting: Family

## 2012-06-04 LAB — VITAMIN D 25 HYDROXY (VIT D DEFICIENCY, FRACTURES): Vit D, 25-Hydroxy: 61 ng/mL (ref 30–89)

## 2012-06-04 NOTE — Telephone Encounter (Signed)
Let Leslie [name] know that her lab results are normal.  The patient voiced understanding.

## 2012-06-09 ENCOUNTER — Other Ambulatory Visit: Payer: Self-pay | Admitting: Radiology

## 2012-06-09 DIAGNOSIS — N6459 Other signs and symptoms in breast: Secondary | ICD-10-CM | POA: Diagnosis not present

## 2012-06-09 DIAGNOSIS — Z853 Personal history of malignant neoplasm of breast: Secondary | ICD-10-CM | POA: Diagnosis not present

## 2012-06-09 DIAGNOSIS — C50912 Malignant neoplasm of unspecified site of left female breast: Secondary | ICD-10-CM

## 2012-06-17 ENCOUNTER — Ambulatory Visit
Admission: RE | Admit: 2012-06-17 | Discharge: 2012-06-17 | Disposition: A | Discharge status: Home / Self Care | Payer: Medicare Other | Source: Ambulatory Visit | Attending: Radiology | Admitting: Radiology

## 2012-06-17 DIAGNOSIS — N6459 Other signs and symptoms in breast: Secondary | ICD-10-CM | POA: Diagnosis not present

## 2012-06-17 DIAGNOSIS — Z853 Personal history of malignant neoplasm of breast: Secondary | ICD-10-CM | POA: Diagnosis not present

## 2012-06-17 DIAGNOSIS — C50912 Malignant neoplasm of unspecified site of left female breast: Secondary | ICD-10-CM

## 2012-06-17 MED ORDER — GADOBENATE DIMEGLUMINE 529 MG/ML IV SOLN
12.0000 mL | Freq: Once | INTRAVENOUS | Status: AC | PRN
  Administered 2012-06-17: 12 mL via INTRAVENOUS

## 2012-08-26 ENCOUNTER — Other Ambulatory Visit: Payer: Self-pay

## 2012-10-29 DIAGNOSIS — Z23 Encounter for immunization: Secondary | ICD-10-CM | POA: Diagnosis not present

## 2012-11-12 ENCOUNTER — Ambulatory Visit (INDEPENDENT_AMBULATORY_CARE_PROVIDER_SITE_OTHER): Payer: Medicare Other | Admitting: General Surgery

## 2012-11-12 ENCOUNTER — Encounter (INDEPENDENT_AMBULATORY_CARE_PROVIDER_SITE_OTHER): Payer: Self-pay | Admitting: General Surgery

## 2012-11-12 VITALS — BP 120/82 | HR 76 | Temp 98.9°F | Resp 15 | Ht 63.25 in | Wt 136.6 lb

## 2012-11-12 DIAGNOSIS — Z853 Personal history of malignant neoplasm of breast: Secondary | ICD-10-CM | POA: Diagnosis not present

## 2012-11-12 NOTE — Progress Notes (Signed)
Subjective:     Patient ID: Leslie [name], female   DOB: 10/17/1945, 67 y.o.   MRN: [id number]  HPI This patient follows up todayfor surveillance of her left breast cancer. She has a history of a left breast cancer stage II, stage IIA, pT1c pN1.  Last time I saw her she had complaints of a fullness in her left breast with some associated nipple discharge but this has since resolved. We also referred her for an ultrasound which did not demonstrate any abnormality and this was followed up with an MRI which was also without any significant abnormality. She is doing her self breast exams and denies any suspicious masses or skin changes. She denies any systemic symptoms such as headaches, bony pains, or weight loss. She remains on Femara.   Review of Systems     Objective:   Physical Exam No acute distress and nontoxic-appearing Her right breast is normalexcept for a well-healed scar in the upper outer quadrant. There is no suspicious masses, skin changes, or lymphadenopathy. Her left breast shows a well-healed surgical scar at the 11:00 position of the breast without any associated masses, skin changes, or lymphadenopathy. There is no evidence of recurrent disease.  And I was not able to express any nipple discharge    Assessment:     History of left breast cancer-doing well She is doing very well from her procedure without any evidence of recurrent disease. She no longer is having any nipple discharge and she does not appreciate any suspicious masses. Since her last visit she's had an MRI of the breast which was normal as well. I do not see any evidence of recurrent disease.     Plan:     I recommended that she continue with her monthly self breast exam and annual clinical exams and followup with oncology as scheduled. She is due for her annual mammograms in January.

## 2012-12-04 ENCOUNTER — Ambulatory Visit: Payer: Medicare Other | Admitting: Oncology

## 2012-12-28 ENCOUNTER — Encounter: Payer: Self-pay | Admitting: Oncology

## 2012-12-28 NOTE — Progress Notes (Signed)
[facility], [id number], approved femara from 12/28/12-01/20/14.

## 2013-01-01 DIAGNOSIS — C50919 Malignant neoplasm of unspecified site of unspecified female breast: Secondary | ICD-10-CM | POA: Diagnosis not present

## 2013-01-01 DIAGNOSIS — Z1211 Encounter for screening for malignant neoplasm of colon: Secondary | ICD-10-CM | POA: Diagnosis not present

## 2013-01-01 DIAGNOSIS — F17200 Nicotine dependence, unspecified, uncomplicated: Secondary | ICD-10-CM | POA: Diagnosis not present

## 2013-01-01 DIAGNOSIS — I10 Essential (primary) hypertension: Secondary | ICD-10-CM | POA: Diagnosis not present

## 2013-01-01 DIAGNOSIS — Z0000 Encounter for general adult medical examination without abnormal findings: Secondary | ICD-10-CM | POA: Diagnosis not present

## 2013-01-01 DIAGNOSIS — Z1331 Encounter for screening for depression: Secondary | ICD-10-CM | POA: Diagnosis not present

## 2013-01-08 DIAGNOSIS — Z1211 Encounter for screening for malignant neoplasm of colon: Secondary | ICD-10-CM | POA: Diagnosis not present

## 2013-02-08 ENCOUNTER — Other Ambulatory Visit: Payer: Self-pay | Admitting: Physician Assistant

## 2013-02-08 DIAGNOSIS — C50919 Malignant neoplasm of unspecified site of unspecified female breast: Secondary | ICD-10-CM

## 2013-02-09 ENCOUNTER — Telehealth: Payer: Self-pay | Admitting: Oncology

## 2013-02-09 ENCOUNTER — Ambulatory Visit (HOSPITAL_BASED_OUTPATIENT_CLINIC_OR_DEPARTMENT_OTHER): Payer: Medicare Other | Admitting: Oncology

## 2013-02-09 ENCOUNTER — Other Ambulatory Visit (HOSPITAL_BASED_OUTPATIENT_CLINIC_OR_DEPARTMENT_OTHER): Payer: Medicare Other

## 2013-02-09 VITALS — BP 127/77 | HR 80 | Temp 98.5°F | Resp 20 | Ht 63.25 in | Wt 136.6 lb

## 2013-02-09 DIAGNOSIS — C773 Secondary and unspecified malignant neoplasm of axilla and upper limb lymph nodes: Secondary | ICD-10-CM

## 2013-02-09 DIAGNOSIS — F17200 Nicotine dependence, unspecified, uncomplicated: Secondary | ICD-10-CM | POA: Diagnosis not present

## 2013-02-09 DIAGNOSIS — N951 Menopausal and female climacteric states: Secondary | ICD-10-CM

## 2013-02-09 DIAGNOSIS — R232 Flushing: Secondary | ICD-10-CM

## 2013-02-09 DIAGNOSIS — C50912 Malignant neoplasm of unspecified site of left female breast: Secondary | ICD-10-CM | POA: Insufficient documentation

## 2013-02-09 DIAGNOSIS — C50919 Malignant neoplasm of unspecified site of unspecified female breast: Secondary | ICD-10-CM

## 2013-02-09 DIAGNOSIS — Z170 Estrogen receptor positive status [ER+]: Secondary | ICD-10-CM | POA: Diagnosis not present

## 2013-02-09 LAB — CBC WITH DIFFERENTIAL/PLATELET
BASO%: 1 % (ref 0.0–2.0)
Basophils Absolute: 0.1 10*3/uL (ref 0.0–0.1)
EOS%: 1.3 % (ref 0.0–7.0)
Eosinophils Absolute: 0.1 10*3/uL (ref 0.0–0.5)
HCT: 43.6 % (ref 34.8–46.6)
HGB: 15 g/dL (ref 11.6–15.9)
LYMPH%: 41.8 % (ref 14.0–49.7)
MCH: 32 pg (ref 25.1–34.0)
MCHC: 34.3 g/dL (ref 31.5–36.0)
MCV: 93.4 fL (ref 79.5–101.0)
MONO#: 0.9 10*3/uL (ref 0.1–0.9)
MONO%: 13.5 % (ref 0.0–14.0)
NEUT#: 2.8 10*3/uL (ref 1.5–6.5)
NEUT%: 42.4 % (ref 38.4–76.8)
Platelets: 279 10*3/uL (ref 145–400)
RBC: 4.67 10*6/uL (ref 3.70–5.45)
RDW: 12.7 % (ref 11.2–14.5)
WBC: 6.5 10*3/uL (ref 3.9–10.3)
lymph#: 2.7 10*3/uL (ref 0.9–3.3)

## 2013-02-09 LAB — COMPREHENSIVE METABOLIC PANEL (CC13)
ALT: 20 U/L (ref 0–55)
AST: 19 U/L (ref 5–34)
Albumin: 4 g/dL (ref 3.5–5.0)
Alkaline Phosphatase: 68 U/L (ref 40–150)
Anion Gap: 11 mEq/L (ref 3–11)
BUN: 11 mg/dL (ref 7.0–26.0)
CO2: 27 mEq/L (ref 22–29)
Calcium: 9.8 mg/dL (ref 8.4–10.4)
Chloride: 103 mEq/L (ref 98–109)
Creatinine: 0.9 mg/dL (ref 0.6–1.1)
Glucose: 134 mg/dl (ref 70–140)
Potassium: 3.4 mEq/L — ABNORMAL LOW (ref 3.5–5.1)
Sodium: 141 mEq/L (ref 136–145)
Total Bilirubin: 0.49 mg/dL (ref 0.20–1.20)
Total Protein: 7.4 g/dL (ref 6.4–8.3)

## 2013-02-09 NOTE — Progress Notes (Signed)
[place]  [phone] [phone]  OFFICE PROGRESS NOTE   ID: Leslie [name]   DOB: 09/12/1945  MR#: [id number]  WYS#:[id number]   PCP: Leslie Neer, MD SU: Leslie [name], M.D.  RAD ONC: Leslie [name], M.D.   HISTORY OF PRESENT ILLNESS: From Dr. Eston [name] he patient evaluation note dated 12/07/2008: "This is a pleasant 68 year old woman from [facility] referred by Dr. Zenia [name] for evaluation and treatment of breast cancer. This woman has been in relatively good health.  She undergoes annual screening mammography.  Her husband is a patient of Dr. Julien [name] who has lung cancer.  She had a screening mammogram on 11/11/2008, which showed an abnormality.  A follow up diagnostic mammogram and right breast ultrasound was performed.  This showed a hyperdense spiculated mass 12 o'clock left breast measuring 5 x 3 x 1.5 cm.  There is an associated abnormality seen on ultrasound as well.  Biopsy was recommended with BSGI scan performed 11/23/2008 showed an uptake with a 3 cm of intense isotope activity.  An ultrasound-guided biopsy of both the breast mass in the left breast as well as left axilla was performed on 11/23/2008, both of which show invasive mammary carcinoma.  Prognostic panel showed this to be ER positive at 99%, PR positive at 7%, proliferative index 17%, HER-2 was negative with ratio of 1.2.  An E-cadherin test was performed, which was negative suggestive of lobular phenotype.  The patient has subsequently been referred for a neoadjuvant therapy.  Of note is she had an MRI scan on 12/01/2008, which showed a 4.9 x 3.5 x 2.9 cm area of lobular carcinoma 12 o'clock position left breast.  Axillary lymph nodes were also seen measuring 1.6 x 1.1 cm.  Multiple right breast cysts were also seen as well."  Her subsequent history is as detailed below.   INTERVAL HISTORY: Leslie [name] returns today for followup of her breast cancer. Interval history is unremarkable. Family is  doing fine. She had an episode of breast drainage last May, which was evaluated at [facility]. An MRI was obtained, copy below, and was unremarkable. The drainage, which was nonbloody, resolved and has not recurred  REVIEW OF SYSTEMS: She has mild hot flashes, which are not worse than before. She has problems with her dentures. Otherwise a detailed review of systems today was entirely noncontributory   PAST MEDICAL HISTORY: Past Medical History  Diagnosis Date  . Hypertension   . Breast cancer 2011    Left Breast Cancer  . Depression   . Benign breast cyst in female     Right breast    PAST SURGICAL HISTORY: Past Surgical History  Procedure Laterality Date  . Tubal ligation    . Abdominal hysterectomy    . Breast lumpectomy Left 11/23/2008  . Breast cyst excision Right   Includes hysterectomy in her 29s secondary to tubal pregnancy, ovaries were intact, she has a history of right breast cyst and associated with right breast biopsy   FAMILY HISTORY Family History  Problem Relation Age of Onset  . Hypertension Mother   . Seizures Mother   . Heart disease Father   . Hypertension Sister   No history of breast or ovarian cancer in the family.  Father is alive and well at 42, has a history of bad heart, mother is deceased age 52.  She has five sisters, two brothers, all of whom in good health.   GYNECOLOGIC HISTORY: G4, P3, menarche age 74, hysterectomy in her 16s.  She has been on estrogen patch, which she is still on currently.  Three adult children ages 23, 23, and 22.  One lives in [place], one in [place], one in [place].  She has one grandchild.   SOCIAL HISTORY: The patient is now widowed, but was married for 45 years.  Her husband had a history of lung cancer.  The patient has 3 adult sons that live in [place], [place], and [place] respectively.  The patient has one granddaughter.  The patient has retired from [employer] as a Engineer, mining.  In her spare  time the patient enjoys traveling, listening to music, exercising, and playing computer games.   ADVANCED DIRECTIVES: Not on file   HEALTH MAINTENANCE: History  Substance Use Topics  . Smoking status: Current Every Day Smoker -- 0.50 packs/day  . Smokeless tobacco: Never Used     Comment: 1/2 pack per day  . Alcohol Use: Yes     Comment: glass of wine maybe every 4 months    Colonoscopy: Not on file PAP: The last Pap smear that we have on record was in 11/2010 from [facility]. Bone density: The patient states she had a bone density examination in 03/2012 by [facility]-we have not received a copy of the bone density scan.   Lipid panel: Not on file   Allergies  Allergen Reactions  . Aspirin     nausea    Current Outpatient Prescriptions  Medication Sig Dispense Refill  . atenolol-chlorthalidone (TENORETIC) 50-25 MG per tablet Take 1 tablet by mouth daily.        Ileene Patrick Products (PERIDIN-C PO) Take by mouth daily.      . calcium-vitamin D (OSCAL WITH D) 500-200 MG-UNIT per tablet Take 1 tablet by mouth daily.        . Evening Primrose topical oil Apply topically as needed.      Marland Kitchen FOLIC ACID PO Take by mouth daily.        Marland Kitchen letrozole (FEMARA) 2.5 MG tablet Take 1 tablet (2.5 mg total) by mouth daily.  90 tablet  7  . Multiple Vitamin (MULTIVITAMIN PO) Take by mouth daily.        Marland Kitchen ZOSTAVAX 78588 UNT/0.65ML injection        No current facility-administered medications for this visit.    OBJECTIVE: Middle-aged [place] American woman in no acute distress Filed Vitals:   02/09/13 1113  BP: 127/77  Pulse: 80  Temp: 98.5 F (36.9 C)  Resp: 20     Body mass index is 23.99 kg/(m^2).      ECOG FS: 0 - Asymptomatic  Sclerae unicteric, pupils equal and round Oropharynx clear and moist-- no thrush No cervical or supraclavicular adenopathy Lungs no rales or rhonchi Heart regular rate and rhythm Abd soft, nontender, positive bowel sounds MSK no focal  spinal tenderness, no upper extremity lymphedema Neuro: nonfocal, well oriented, appropriate affect Breasts: The right breast is unremarkable. The left breast is moderately hyperpigmented. There is some skin thickening from the radiation. There is no evidence of local recurrence. There is no discharge. The left axilla is benign   LAB RESULTS: Lab Results  Component Value Date   WBC 6.5 02/09/2013   NEUTROABS 2.8 02/09/2013   HGB 15.0 02/09/2013   HCT 43.6 02/09/2013   MCV 93.4 02/09/2013   PLT 279 02/09/2013      Chemistry      Component Value Date/Time   NA 141 06/03/2012 1106   NA 138  05/30/2011 1405   K 3.6 06/03/2012 1106   K 3.6 05/30/2011 1405   CL 102 06/03/2012 1106   CL 99 05/30/2011 1405   CO2 29 06/03/2012 1106   CO2 30 05/30/2011 1405   BUN 9.4 06/03/2012 1106   BUN 12 05/30/2011 1405   CREATININE 0.9 06/03/2012 1106   CREATININE 0.91 05/30/2011 1405      Component Value Date/Time   CALCIUM 10.0 06/03/2012 1106   CALCIUM 10.6* 05/30/2011 1405   ALKPHOS 78 06/03/2012 1106   ALKPHOS 61 05/30/2011 1405   AST 19 06/03/2012 1106   AST 19 05/30/2011 1405   ALT 21 06/03/2012 1106   ALT 14 05/30/2011 1405   BILITOT 0.50 06/03/2012 1106   BILITOT 0.5 05/30/2011 1405       Lab Results  Component Value Date   LABCA2 33 12/05/2011    Urinalysis    Component Value Date/Time   COLORURINE YELLOW 08/17/2010 1105   APPEARANCEUR CLEAR 08/17/2010 1105   LABSPEC 1.017 08/17/2010 1105   PHURINE 8.0 08/17/2010 1105   GLUCOSEU NEGATIVE 08/17/2010 1105   HGBUR NEGATIVE 08/17/2010 1105   BILIRUBINUR NEGATIVE 08/17/2010 1105   KETONESUR NEGATIVE 08/17/2010 1105   PROTEINUR NEGATIVE 08/17/2010 1105   UROBILINOGEN 0.2 08/17/2010 1105   NITRITE NEGATIVE 08/17/2010 1105   LEUKOCYTESUR NEGATIVE 08/17/2010 1105    STUDIES: BILATERAL BREAST MRI WITH AND WITHOUT CONTRAST  Technique: Multiplanar, multisequence MR images of both breasts  were obtained prior to and following the intravenous administration  of 1m of  Multihance. Three dimensional images were evaluated at  the independent DynaCad workstation.  Comparison: outside mammographic studies 02/07/11 and 02/10/12  Findings: There is mild background parenchymal enhancment, with a  nonspecific multinodular pattern that is more prominent on the  right than the left. There is no suspicious finding on either  side. On the left, there is postoperative scarring in the upper  inner quadrant. There is diffuse left breast skin thickening and  mild enhancement, which includes the nipple/areolar complex,  consistent with changes related to radiation therapy. There is no  suspicious focal enhancement or mass. There is no adenopathy on  either side.  IMPRESSION:  Anticipated findings on the left consistent with prior lumpectomy  and radiation therapy.   ASSESSMENT: 68[place]: 1.  Status post left breast needle core biopsy at the 12 o'clock position and left axillary needle core biopsy on 11/23/2008 which showed invasive mammary carcinoma, and one lymph node positive for metastatic carcinoma, ER 99%, PR 7%, Ki-67 17%, HER-2/neu by CISH no amplification with a ratio of 1.20.  2.  Status post bilateral breast MRI on 12/01/2008 which showed a 4.9 x 3.5 x 2.9 cm spiculated mass at the 12o'clock position of the left breast.  Corresponded to the biopsied invasive lobular carcinoma.  It extended anteriorly into the sub-areola region of the breast with associated skin retraction superior to the nipple.  No definite nipple invasion was identified.  No other masses or areas of enhancement suspicious for malignancy in either breast.  Asymmetrically prominent left axillary lymph nodes  were demonstrated.  The largest measured 1.6 x 1.1 cm in maximum dimensions.  Metastatic left axillary adenopathy. No abnormal internal mammary or right axillary lymph nodes  were demonstrated.  Multiple right breast cysts.  3.  Status post neoadjuvant  antiestrogen therapy with  Femara started in 12/2008 until 06/2009 at the time of her definitive surgery, then resumed post-op  4.  Status post left breast lumpectomy with regional lymph node resection on 07/12/2009 for a stage IIA, pT1c pN1, invasive lobular carcinoma 1.5 cm with angiolymphatic invasion identified, left axillary area had extracapsular tumor extension identified, 2/8 positive lymph nodes, ER 81%, PR 0%, Ki-67 74%, HER-2/neu by CISH no amplification with a ratio of 1.25.  5. Status post Oncotype DX report dated 08/10/2009 which shows a breast recurrence score of 20% with an average rate of distant recurrence of 13%.  6.  Status post breast excision of anterior and superior left margins, medial margins and lateral margins which showed lobular neoplasia (atypical lobular hyperplasia), invasive carcinoma not identified on 08/04/2009.  7.  Status post radiation therapy from 09/26/2009-10/21/201.  8.  Left breast leakage and tenderness.  9.  Flashes and night sweats  10.  Tobacco abuse-- no interest in quitting at this point  PLAN: Alexiya is doing fine from a breast cancer point of view and she will see [place] one more time December of this year at which point she will graduate. She will have completed 5 years on an aromatase inhibitors by then.   I again counseled her on smoking cessation, but she just does not feel that is something she wants to do right now. She knows to call for any problems that may develop before her next visit here.   Chauncey Cruel, MD  02/09/2013, 11:24 AM

## 2013-02-10 DIAGNOSIS — Z853 Personal history of malignant neoplasm of breast: Secondary | ICD-10-CM | POA: Diagnosis not present

## 2013-02-15 DIAGNOSIS — J029 Acute pharyngitis, unspecified: Secondary | ICD-10-CM | POA: Diagnosis not present

## 2013-02-15 DIAGNOSIS — F17200 Nicotine dependence, unspecified, uncomplicated: Secondary | ICD-10-CM | POA: Diagnosis not present

## 2013-02-24 DIAGNOSIS — H2510 Age-related nuclear cataract, unspecified eye: Secondary | ICD-10-CM | POA: Diagnosis not present

## 2013-02-24 DIAGNOSIS — H25019 Cortical age-related cataract, unspecified eye: Secondary | ICD-10-CM | POA: Diagnosis not present

## 2013-05-10 DIAGNOSIS — J309 Allergic rhinitis, unspecified: Secondary | ICD-10-CM | POA: Diagnosis not present

## 2013-05-25 ENCOUNTER — Ambulatory Visit
Admission: RE | Admit: 2013-05-25 | Discharge: 2013-05-25 | Disposition: A | Discharge status: Home / Self Care | Payer: Medicare Other | Source: Ambulatory Visit | Attending: Family Medicine | Admitting: Family Medicine

## 2013-05-25 ENCOUNTER — Other Ambulatory Visit: Payer: Self-pay | Admitting: Family Medicine

## 2013-05-25 DIAGNOSIS — R079 Chest pain, unspecified: Secondary | ICD-10-CM

## 2013-05-25 DIAGNOSIS — G479 Sleep disorder, unspecified: Secondary | ICD-10-CM | POA: Diagnosis not present

## 2013-05-25 DIAGNOSIS — R0789 Other chest pain: Secondary | ICD-10-CM | POA: Diagnosis not present

## 2013-05-25 DIAGNOSIS — R0602 Shortness of breath: Secondary | ICD-10-CM | POA: Diagnosis not present

## 2013-06-15 ENCOUNTER — Other Ambulatory Visit: Payer: Self-pay | Admitting: *Deleted

## 2013-06-15 DIAGNOSIS — Z853 Personal history of malignant neoplasm of breast: Secondary | ICD-10-CM

## 2013-06-15 DIAGNOSIS — M8580 Other specified disorders of bone density and structure, unspecified site: Secondary | ICD-10-CM

## 2013-06-15 DIAGNOSIS — C50912 Malignant neoplasm of unspecified site of left female breast: Secondary | ICD-10-CM

## 2013-06-15 MED ORDER — LETROZOLE 2.5 MG PO TABS: 2.5000 mg | ORAL_TABLET | Freq: Every day | ORAL | Status: DC

## 2013-10-13 DIAGNOSIS — R229 Localized swelling, mass and lump, unspecified: Secondary | ICD-10-CM | POA: Diagnosis not present

## 2013-10-13 DIAGNOSIS — H6120 Impacted cerumen, unspecified ear: Secondary | ICD-10-CM | POA: Diagnosis not present

## 2013-10-13 DIAGNOSIS — Z23 Encounter for immunization: Secondary | ICD-10-CM | POA: Diagnosis not present

## 2013-10-27 DIAGNOSIS — L723 Sebaceous cyst: Secondary | ICD-10-CM | POA: Diagnosis not present

## 2013-11-22 ENCOUNTER — Encounter (INDEPENDENT_AMBULATORY_CARE_PROVIDER_SITE_OTHER): Payer: Self-pay | Admitting: General Surgery

## 2013-12-28 ENCOUNTER — Other Ambulatory Visit (HOSPITAL_BASED_OUTPATIENT_CLINIC_OR_DEPARTMENT_OTHER): Payer: Medicare Other

## 2013-12-28 ENCOUNTER — Ambulatory Visit (HOSPITAL_BASED_OUTPATIENT_CLINIC_OR_DEPARTMENT_OTHER): Payer: Medicare Other | Admitting: Oncology

## 2013-12-28 VITALS — BP 125/76 | HR 70 | Temp 98.2°F | Resp 18 | Ht 63.0 in | Wt 129.9 lb

## 2013-12-28 DIAGNOSIS — Z853 Personal history of malignant neoplasm of breast: Secondary | ICD-10-CM

## 2013-12-28 DIAGNOSIS — Z720 Tobacco use: Secondary | ICD-10-CM

## 2013-12-28 DIAGNOSIS — C50912 Malignant neoplasm of unspecified site of left female breast: Secondary | ICD-10-CM

## 2013-12-28 DIAGNOSIS — C50919 Malignant neoplasm of unspecified site of unspecified female breast: Secondary | ICD-10-CM

## 2013-12-28 LAB — CBC WITH DIFFERENTIAL/PLATELET
BASO%: 1.5 % (ref 0.0–2.0)
Basophils Absolute: 0.1 10*3/uL (ref 0.0–0.1)
EOS%: 1.2 % (ref 0.0–7.0)
Eosinophils Absolute: 0.1 10*3/uL (ref 0.0–0.5)
HCT: 44.4 % (ref 34.8–46.6)
HGB: 14.6 g/dL (ref 11.6–15.9)
LYMPH%: 40.8 % (ref 14.0–49.7)
MCH: 30.9 pg (ref 25.1–34.0)
MCHC: 32.8 g/dL (ref 31.5–36.0)
MCV: 94.2 fL (ref 79.5–101.0)
MONO#: 0.7 10*3/uL (ref 0.1–0.9)
MONO%: 10.9 % (ref 0.0–14.0)
NEUT#: 2.9 10*3/uL (ref 1.5–6.5)
NEUT%: 45.6 % (ref 38.4–76.8)
Platelets: 287 10*3/uL (ref 145–400)
RBC: 4.71 10*6/uL (ref 3.70–5.45)
RDW: 12.9 % (ref 11.2–14.5)
WBC: 6.3 10*3/uL (ref 3.9–10.3)
lymph#: 2.6 10*3/uL (ref 0.9–3.3)

## 2013-12-28 NOTE — Progress Notes (Signed)
[place]  Telephone:(336) [phone] Fax:(336) [phone]  OFFICE PROGRESS NOTE   ID: Leslie [name]   DOB: 1945-07-12  MR#: [id number]  XFG#:[id number]   PCP: Mayra Neer, MD SU: Ronnald Collum, M.D.  RAD ONC: Jodelle Gross, M.D.   HISTORY OF PRESENT ILLNESS: From [name] he patient evaluation note dated 12/07/2008:  "This is a pleasant 68 year old woman from [facility] referred by Dr. [name] for evaluation and treatment of breast cancer. This woman has been in relatively good health.  Leslie [name] undergoes annual screening mammography.  Leslie [name] husband is a patient of Dr. [name] who has lung cancer.  Leslie [name] had a screening mammogram on 11/11/2008, which showed an abnormality.  A follow up diagnostic mammogram and right breast ultrasound was performed.  This showed a hyperdense spiculated mass 12 o'clock left breast measuring 5 x 3 x 1.5 cm.  There is an associated abnormality seen on ultrasound as well.  Biopsy was recommended with BSGI scan performed 11/23/2008 showed an uptake with a 3 cm of intense isotope activity.  An ultrasound-guided biopsy of both the breast mass in the left breast as well as left axilla was performed on 11/23/2008, both of which show invasive mammary carcinoma.  Prognostic panel showed this to be ER positive at 99%, PR positive at 7%, proliferative index 17%, Leslie [name]-2 was negative with ratio of 1.2.  An E-cadherin test was performed, which was negative suggestive of lobular phenotype.  The patient has subsequently been referred for a neoadjuvant therapy.  Of note is Leslie [name] had an MRI scan on 12/01/2008, which showed a 4.9 x 3.5 x 2.9 cm area of lobular carcinoma 12 o'clock position left breast.  Axillary lymph nodes were also seen measuring 1.6 x 1.1 cm.  Multiple right breast cysts were also seen as well."    Leslie [name] subsequent history is as detailed below.   INTERVAL HISTORY: Leslie [name] returns today for followup of Leslie [name] breast cancer. Leslie [name] continues on letrozole and in fact  is completing 5 years this month. Leslie [name] has had problems from hot flashes and hair thinning primarily from this medication. Leslie [name] never did develop significant arthralgias or myalgias. Vaginal dryness was closed plus minus". On the "gap" for Medicare, Leslie [name] had to pay more than $300 for this medication.  REVIEW OF SYSTEMS: Leslie [name] continues to smoke, about a half a pack per day. At this point Leslie [name] is not ready to quit. Aside from that a detailed review of systems today was noncontributory   PAST MEDICAL HISTORY: Past Medical History  Diagnosis Date  . Hypertension   . Breast cancer 2011    Left Breast Cancer  . Depression   . Benign breast cyst in female     Right breast    PAST SURGICAL HISTORY: Past Surgical History  Procedure Laterality Date  . Tubal ligation    . Abdominal hysterectomy    . Breast lumpectomy Left 11/23/2008  . Breast cyst excision Right   Includes hysterectomy in Leslie [name] 31s secondary to tubal pregnancy, ovaries were intact, Leslie [name] has a history of right breast cyst and associated with right breast biopsy   FAMILY HISTORY Family History  Problem Relation Age of Onset  . Hypertension Mother   . Seizures Mother   . Heart disease Father   . Hypertension Sister   No history of breast or ovarian cancer in the family.  Father is alive and well at 68, has a history of bad heart, mother is deceased age 72.  Leslie [name] has five  sisters, two brothers, all of whom in good health.   GYNECOLOGIC HISTORY: G4, P3, menarche age 66, hysterectomy in Leslie [name] 20s.  Leslie [name] has been on estrogen patch, which Leslie [name] is still on currently.  Three adult children ages 24, 63, and 29.  One lives in [place], one in [place], one in [place].  Leslie [name] has one grandchild.   SOCIAL HISTORY: The patient is now widowed, but was married for 45 years.  Leslie [name] husband had a history of lung cancer.  The patient has 3 adult sons that live in [place], [place], and [place] respectively.  The patient has one  granddaughter.  The patient has retired from [employer] as a Engineer, mining.  In Leslie [name] spare time the patient enjoys traveling, listening to music, exercising, and playing computer games.   ADVANCED DIRECTIVES: Not on file   HEALTH MAINTENANCE: History  Substance Use Topics  . Smoking status: Current Every Day Smoker -- 0.50 packs/day  . Smokeless tobacco: Never Used     Comment: 1/2 pack per day  . Alcohol Use: Yes     Comment: glass of wine maybe every 4 months    Colonoscopy: Not on file PAP: The last Pap smear that we have on record was in 11/2010 from [facility]. Bone density: The patient states Leslie [name] had a bone density examination in 03/2012 by [facility]-we have not received a copy of the bone density scan.   Lipid panel: Not on file   Allergies  Allergen Reactions  . Aspirin     nausea    Current Outpatient Prescriptions  Medication Sig Dispense Refill  . atenolol-chlorthalidone (TENORETIC) 50-25 MG per tablet Take 1 tablet by mouth daily.      Ileene Patrick Products (PERIDIN-C PO) Take by mouth daily.    . calcium-vitamin D (OSCAL WITH D) 500-200 MG-UNIT per tablet Take 1 tablet by mouth daily.      . Evening Primrose topical oil Apply topically as needed.    Marland Kitchen FOLIC ACID PO Take by mouth daily.      Marland Kitchen letrozole (FEMARA) 2.5 MG tablet Take 1 tablet (2.5 mg total) by mouth daily. 90 tablet 2  . Multiple Vitamin (MULTIVITAMIN PO) Take by mouth daily.      Marland Kitchen ZOSTAVAX 79390 UNT/0.65ML injection      No current facility-administered medications for this visit.    OBJECTIVE: Middle-aged [place] American woman who appears well Filed Vitals:   12/28/13 1147  BP: 125/76  Pulse: 70  Temp: 98.2 F (36.8 C)  Resp: 18     Body mass index is 23.02 kg/(m^2).      ECOG FS: 0 - Asymptomatic  Sclerae unicteric, EOMs intact Oropharynx clear and moist No cervical or supraclavicular adenopathy Lungs no rales or rhonchi Heart regular rate and rhythm, no murmur  appreciated Abd soft, nontender, positive bowel sounds MSK no focal spinal tenderness, no upper extremity lymphedema Neuro: nonfocal, well oriented, positive affect Breasts: The right breast is unremarkable. The left breast is status post lumpectomy and radiation.. There is no evidence of local recurrence. The left axilla is benign   LAB RESULTS: Lab Results  Component Value Date   WBC 6.3 12/28/2013   NEUTROABS 2.9 12/28/2013   HGB 14.6 12/28/2013   HCT 44.4 12/28/2013   MCV 94.2 12/28/2013   PLT 287 12/28/2013      Chemistry      Component Value Date/Time   NA 141 02/09/2013 1056   NA 138 05/30/2011 1405  K 3.4* 02/09/2013 1056   K 3.6 05/30/2011 1405   CL 102 06/03/2012 1106   CL 99 05/30/2011 1405   CO2 27 02/09/2013 1056   CO2 30 05/30/2011 1405   BUN 11.0 02/09/2013 1056   BUN 12 05/30/2011 1405   CREATININE 0.9 02/09/2013 1056   CREATININE 0.91 05/30/2011 1405      Component Value Date/Time   CALCIUM 9.8 02/09/2013 1056   CALCIUM 10.6* 05/30/2011 1405   ALKPHOS 68 02/09/2013 1056   ALKPHOS 61 05/30/2011 1405   AST 19 02/09/2013 1056   AST 19 05/30/2011 1405   ALT 20 02/09/2013 1056   ALT 14 05/30/2011 1405   BILITOT 0.49 02/09/2013 1056   BILITOT 0.5 05/30/2011 1405       Lab Results  Component Value Date   LABCA2 33 12/05/2011    Urinalysis    Component Value Date/Time   COLORURINE YELLOW 08/17/2010 1105   APPEARANCEUR CLEAR 08/17/2010 1105   LABSPEC 1.017 08/17/2010 1105   PHURINE 8.0 08/17/2010 1105   GLUCOSEU NEGATIVE 08/17/2010 1105   HGBUR NEGATIVE 08/17/2010 1105   BILIRUBINUR NEGATIVE 08/17/2010 1105   KETONESUR NEGATIVE 08/17/2010 1105   PROTEINUR NEGATIVE 08/17/2010 1105   UROBILINOGEN 0.2 08/17/2010 1105   NITRITE NEGATIVE 08/17/2010 1105   LEUKOCYTESUR NEGATIVE 08/17/2010 1105    STUDIES: Mammography at [facility] January 2015 was unremarkable.  ASSESSMENT: 68 y.o. [place], [place]  woman: 1.  Status post left  breast needle core biopsy at the 12 o'clock position and left axillary needle core biopsy on 11/23/2008 which showed invasive mammary carcinoma, and one lymph node positive for metastatic carcinoma, ER 99%, PR 7%, Ki-67 17%, Leslie [name]-2/neu by CISH no amplification with a ratio of 1.20.  2.  Status post bilateral breast MRI on 12/01/2008 which showed a 4.9 x 3.5 x 2.9 cm spiculated mass at the 12o'clock position of the left breast.  Corresponded to the biopsied invasive lobular carcinoma.  It extended anteriorly into the sub-areola region of the breast with associated skin retraction superior to the nipple.  No definite nipple invasion was identified.  No other masses or areas of enhancement suspicious for malignancy in either breast.  Asymmetrically prominent left axillary lymph nodes  were demonstrated.  The largest measured 1.6 x 1.1 cm in maximum dimensions.  Metastatic left axillary adenopathy. No abnormal internal mammary or right axillary lymph nodes  were demonstrated.  Multiple right breast cysts.  3.  Status post neoadjuvant antiestrogen therapy with  Femara started in 12/2008 until 06/2009 at the time of Leslie [name] definitive surgery, then resumed post-op  4.  Status post left breast lumpectomy with regional lymph node resection on 07/12/2009 for a stage IIA, pT1c pN1, invasive lobular carcinoma 1.5 cm with angiolymphatic invasion identified, left axillary area had extracapsular tumor extension identified, 2/8 positive lymph nodes, ER 81%, PR 0%, Ki-67 74%, Leslie [name]-2/neu by CISH no amplification with a ratio of 1.25.  5. Status post Oncotype DX report dated 08/10/2009 which shows a breast recurrence score of 20% with an average rate of distant recurrence of 13%.  6.  Status post breast excision of anterior and superior left margins, medial margins and lateral margins which showed lobular neoplasia (atypical lobular hyperplasia), invasive carcinoma not identified on 08/04/2009.  7.  Status post radiation therapy  from 09/26/2009-10/21/201.  8.  Left breast leakage and tenderness.  9.  Flashes and night sweats  10.  Tobacco abuse-- no interest in quitting at this point  PLAN: Mayda has  completed 5 years of letrozole. At this point we do not have category 1 data to suggest that continuing aromatase inhibitors beyond 5 years is helpful (or for that matter harmful). Accordingly my standard recommendation is to stop at 5 years.  Leslie [name] will need yearly mammography, and although Leslie [name] breast density is only category B, I recommended that Leslie [name] also have the tomography (3-D" [facility] supplemented. Leslie [name] also needs a yearly physician breast exam.  It would be helpful also if Leslie [name] quit smoking. We again discussed that today. Leslie [name] is not quite ready for that at this point.  I will be glad to see [place] at any point in the future if and when the need arises. As of now however we are not making any further routine appointments for Leslie [name] here.  Chauncey Cruel, MD  12/28/2013, 12:02 PM

## 2014-01-19 DIAGNOSIS — Z1211 Encounter for screening for malignant neoplasm of colon: Secondary | ICD-10-CM | POA: Diagnosis not present

## 2014-01-19 DIAGNOSIS — Z720 Tobacco use: Secondary | ICD-10-CM | POA: Diagnosis not present

## 2014-01-19 DIAGNOSIS — I10 Essential (primary) hypertension: Secondary | ICD-10-CM | POA: Diagnosis not present

## 2014-01-19 DIAGNOSIS — Z0001 Encounter for general adult medical examination with abnormal findings: Secondary | ICD-10-CM | POA: Diagnosis not present

## 2014-01-19 DIAGNOSIS — R7301 Impaired fasting glucose: Secondary | ICD-10-CM | POA: Diagnosis not present

## 2014-01-19 DIAGNOSIS — Z853 Personal history of malignant neoplasm of breast: Secondary | ICD-10-CM | POA: Diagnosis not present

## 2014-01-19 DIAGNOSIS — Z23 Encounter for immunization: Secondary | ICD-10-CM | POA: Diagnosis not present

## 2014-01-19 DIAGNOSIS — J301 Allergic rhinitis due to pollen: Secondary | ICD-10-CM | POA: Diagnosis not present

## 2014-02-01 DIAGNOSIS — Z1211 Encounter for screening for malignant neoplasm of colon: Secondary | ICD-10-CM | POA: Diagnosis not present

## 2014-02-14 DIAGNOSIS — Z1231 Encounter for screening mammogram for malignant neoplasm of breast: Secondary | ICD-10-CM | POA: Diagnosis not present

## 2014-02-14 DIAGNOSIS — Z853 Personal history of malignant neoplasm of breast: Secondary | ICD-10-CM | POA: Diagnosis not present

## 2014-03-01 DIAGNOSIS — H2513 Age-related nuclear cataract, bilateral: Secondary | ICD-10-CM | POA: Diagnosis not present

## 2014-05-16 ENCOUNTER — Encounter: Payer: Self-pay | Admitting: Oncology

## 2014-07-01 DIAGNOSIS — M549 Dorsalgia, unspecified: Secondary | ICD-10-CM | POA: Diagnosis not present

## 2014-07-06 ENCOUNTER — Other Ambulatory Visit: Payer: Self-pay | Admitting: Family Medicine

## 2014-07-06 DIAGNOSIS — R109 Unspecified abdominal pain: Secondary | ICD-10-CM

## 2014-07-06 DIAGNOSIS — Z853 Personal history of malignant neoplasm of breast: Secondary | ICD-10-CM

## 2014-07-11 ENCOUNTER — Ambulatory Visit
Admission: RE | Admit: 2014-07-11 | Discharge: 2014-07-11 | Disposition: A | Discharge status: Home / Self Care | Payer: Medicare Other | Source: Ambulatory Visit | Attending: Family Medicine | Admitting: Family Medicine

## 2014-07-11 DIAGNOSIS — Z853 Personal history of malignant neoplasm of breast: Secondary | ICD-10-CM

## 2014-07-11 DIAGNOSIS — R109 Unspecified abdominal pain: Secondary | ICD-10-CM

## 2014-07-11 DIAGNOSIS — N289 Disorder of kidney and ureter, unspecified: Secondary | ICD-10-CM | POA: Diagnosis not present

## 2014-07-11 MED ORDER — IOPAMIDOL (ISOVUE-300) INJECTION 61%
100.0000 mL | Freq: Once | INTRAVENOUS | Status: AC | PRN
  Administered 2014-07-11: 100 mL via INTRAVENOUS

## 2014-07-14 ENCOUNTER — Other Ambulatory Visit: Payer: Self-pay | Admitting: Family Medicine

## 2014-07-14 DIAGNOSIS — N289 Disorder of kidney and ureter, unspecified: Secondary | ICD-10-CM

## 2014-07-18 ENCOUNTER — Other Ambulatory Visit: Payer: Self-pay

## 2014-07-26 ENCOUNTER — Ambulatory Visit
Admission: RE | Admit: 2014-07-26 | Discharge: 2014-07-26 | Disposition: A | Discharge status: Home / Self Care | Payer: Medicare Other | Source: Ambulatory Visit | Attending: Family Medicine | Admitting: Family Medicine

## 2014-07-26 DIAGNOSIS — N289 Disorder of kidney and ureter, unspecified: Secondary | ICD-10-CM | POA: Diagnosis not present

## 2014-07-26 DIAGNOSIS — Z853 Personal history of malignant neoplasm of breast: Secondary | ICD-10-CM | POA: Diagnosis not present

## 2014-07-26 MED ORDER — GADOBENATE DIMEGLUMINE 529 MG/ML IV SOLN
11.0000 mL | Freq: Once | INTRAVENOUS | Status: AC | PRN
  Administered 2014-07-26: 11 mL via INTRAVENOUS

## 2014-11-17 DIAGNOSIS — Z23 Encounter for immunization: Secondary | ICD-10-CM | POA: Diagnosis not present

## 2014-11-29 DIAGNOSIS — L282 Other prurigo: Secondary | ICD-10-CM | POA: Diagnosis not present

## 2014-12-27 DIAGNOSIS — L821 Other seborrheic keratosis: Secondary | ICD-10-CM | POA: Diagnosis not present

## 2015-01-02 DIAGNOSIS — H25013 Cortical age-related cataract, bilateral: Secondary | ICD-10-CM | POA: Diagnosis not present

## 2015-01-25 DIAGNOSIS — E46 Unspecified protein-calorie malnutrition: Secondary | ICD-10-CM | POA: Diagnosis not present

## 2015-01-25 DIAGNOSIS — Z853 Personal history of malignant neoplasm of breast: Secondary | ICD-10-CM | POA: Diagnosis not present

## 2015-01-25 DIAGNOSIS — I10 Essential (primary) hypertension: Secondary | ICD-10-CM | POA: Diagnosis not present

## 2015-01-25 DIAGNOSIS — R7301 Impaired fasting glucose: Secondary | ICD-10-CM | POA: Diagnosis not present

## 2015-01-25 DIAGNOSIS — Z1211 Encounter for screening for malignant neoplasm of colon: Secondary | ICD-10-CM | POA: Diagnosis not present

## 2015-01-25 DIAGNOSIS — Z0001 Encounter for general adult medical examination with abnormal findings: Secondary | ICD-10-CM | POA: Diagnosis not present

## 2015-01-25 DIAGNOSIS — J069 Acute upper respiratory infection, unspecified: Secondary | ICD-10-CM | POA: Diagnosis not present

## 2015-01-25 DIAGNOSIS — J301 Allergic rhinitis due to pollen: Secondary | ICD-10-CM | POA: Diagnosis not present

## 2015-01-25 DIAGNOSIS — Z23 Encounter for immunization: Secondary | ICD-10-CM | POA: Diagnosis not present

## 2015-01-25 DIAGNOSIS — Z720 Tobacco use: Secondary | ICD-10-CM | POA: Diagnosis not present

## 2015-02-02 DIAGNOSIS — H25012 Cortical age-related cataract, left eye: Secondary | ICD-10-CM | POA: Diagnosis not present

## 2015-02-02 DIAGNOSIS — H25812 Combined forms of age-related cataract, left eye: Secondary | ICD-10-CM | POA: Diagnosis not present

## 2015-02-02 DIAGNOSIS — H268 Other specified cataract: Secondary | ICD-10-CM | POA: Diagnosis not present

## 2015-02-13 DIAGNOSIS — Z1211 Encounter for screening for malignant neoplasm of colon: Secondary | ICD-10-CM | POA: Diagnosis not present

## 2015-02-16 DIAGNOSIS — Z1231 Encounter for screening mammogram for malignant neoplasm of breast: Secondary | ICD-10-CM | POA: Diagnosis not present

## 2015-02-16 DIAGNOSIS — Z853 Personal history of malignant neoplasm of breast: Secondary | ICD-10-CM | POA: Diagnosis not present

## 2015-02-23 DIAGNOSIS — H25011 Cortical age-related cataract, right eye: Secondary | ICD-10-CM | POA: Diagnosis not present

## 2015-02-23 DIAGNOSIS — H25811 Combined forms of age-related cataract, right eye: Secondary | ICD-10-CM | POA: Diagnosis not present

## 2015-03-18 DIAGNOSIS — H2000 Unspecified acute and subacute iridocyclitis: Secondary | ICD-10-CM | POA: Diagnosis not present

## 2015-04-06 DIAGNOSIS — H20022 Recurrent acute iridocyclitis, left eye: Secondary | ICD-10-CM | POA: Diagnosis not present

## 2015-07-14 DIAGNOSIS — H2000 Unspecified acute and subacute iridocyclitis: Secondary | ICD-10-CM | POA: Diagnosis not present

## 2015-10-13 DIAGNOSIS — Z23 Encounter for immunization: Secondary | ICD-10-CM | POA: Diagnosis not present

## 2016-02-19 DIAGNOSIS — Z853 Personal history of malignant neoplasm of breast: Secondary | ICD-10-CM | POA: Diagnosis not present

## 2016-02-19 DIAGNOSIS — Z1231 Encounter for screening mammogram for malignant neoplasm of breast: Secondary | ICD-10-CM | POA: Diagnosis not present

## 2016-02-22 DIAGNOSIS — R921 Mammographic calcification found on diagnostic imaging of breast: Secondary | ICD-10-CM | POA: Diagnosis not present

## 2016-02-22 DIAGNOSIS — N6489 Other specified disorders of breast: Secondary | ICD-10-CM | POA: Diagnosis not present

## 2016-02-22 DIAGNOSIS — N6041 Mammary duct ectasia of right breast: Secondary | ICD-10-CM | POA: Diagnosis not present

## 2016-02-22 DIAGNOSIS — Z853 Personal history of malignant neoplasm of breast: Secondary | ICD-10-CM | POA: Diagnosis not present

## 2016-02-28 ENCOUNTER — Other Ambulatory Visit: Payer: Self-pay | Admitting: Radiology

## 2016-02-28 DIAGNOSIS — R921 Mammographic calcification found on diagnostic imaging of breast: Secondary | ICD-10-CM | POA: Diagnosis not present

## 2016-02-28 DIAGNOSIS — N6032 Fibrosclerosis of left breast: Secondary | ICD-10-CM | POA: Diagnosis not present

## 2016-03-12 DIAGNOSIS — Z0000 Encounter for general adult medical examination without abnormal findings: Secondary | ICD-10-CM | POA: Diagnosis not present

## 2016-03-12 DIAGNOSIS — J301 Allergic rhinitis due to pollen: Secondary | ICD-10-CM | POA: Diagnosis not present

## 2016-03-12 DIAGNOSIS — I10 Essential (primary) hypertension: Secondary | ICD-10-CM | POA: Diagnosis not present

## 2016-03-12 DIAGNOSIS — Z1159 Encounter for screening for other viral diseases: Secondary | ICD-10-CM | POA: Diagnosis not present

## 2016-03-12 DIAGNOSIS — Z1211 Encounter for screening for malignant neoplasm of colon: Secondary | ICD-10-CM | POA: Diagnosis not present

## 2016-03-12 DIAGNOSIS — Z853 Personal history of malignant neoplasm of breast: Secondary | ICD-10-CM | POA: Diagnosis not present

## 2016-03-12 DIAGNOSIS — Z720 Tobacco use: Secondary | ICD-10-CM | POA: Diagnosis not present

## 2016-03-12 DIAGNOSIS — R7301 Impaired fasting glucose: Secondary | ICD-10-CM | POA: Diagnosis not present

## 2016-04-03 DIAGNOSIS — Z1211 Encounter for screening for malignant neoplasm of colon: Secondary | ICD-10-CM | POA: Diagnosis not present

## 2016-04-23 DIAGNOSIS — Z961 Presence of intraocular lens: Secondary | ICD-10-CM | POA: Diagnosis not present

## 2016-05-14 DIAGNOSIS — M5412 Radiculopathy, cervical region: Secondary | ICD-10-CM | POA: Diagnosis not present

## 2016-05-30 DIAGNOSIS — M25512 Pain in left shoulder: Secondary | ICD-10-CM | POA: Diagnosis not present

## 2016-05-30 DIAGNOSIS — M542 Cervicalgia: Secondary | ICD-10-CM | POA: Diagnosis not present

## 2016-05-30 DIAGNOSIS — M6281 Muscle weakness (generalized): Secondary | ICD-10-CM | POA: Diagnosis not present

## 2016-06-03 DIAGNOSIS — M542 Cervicalgia: Secondary | ICD-10-CM | POA: Diagnosis not present

## 2016-06-03 DIAGNOSIS — M6281 Muscle weakness (generalized): Secondary | ICD-10-CM | POA: Diagnosis not present

## 2016-06-03 DIAGNOSIS — M25512 Pain in left shoulder: Secondary | ICD-10-CM | POA: Diagnosis not present

## 2016-06-06 DIAGNOSIS — M6281 Muscle weakness (generalized): Secondary | ICD-10-CM | POA: Diagnosis not present

## 2016-06-06 DIAGNOSIS — M25512 Pain in left shoulder: Secondary | ICD-10-CM | POA: Diagnosis not present

## 2016-06-06 DIAGNOSIS — M542 Cervicalgia: Secondary | ICD-10-CM | POA: Diagnosis not present

## 2016-06-10 DIAGNOSIS — M542 Cervicalgia: Secondary | ICD-10-CM | POA: Diagnosis not present

## 2016-06-10 DIAGNOSIS — M6281 Muscle weakness (generalized): Secondary | ICD-10-CM | POA: Diagnosis not present

## 2016-06-10 DIAGNOSIS — M25512 Pain in left shoulder: Secondary | ICD-10-CM | POA: Diagnosis not present

## 2016-06-12 DIAGNOSIS — M6281 Muscle weakness (generalized): Secondary | ICD-10-CM | POA: Diagnosis not present

## 2016-06-12 DIAGNOSIS — M25512 Pain in left shoulder: Secondary | ICD-10-CM | POA: Diagnosis not present

## 2016-06-12 DIAGNOSIS — M542 Cervicalgia: Secondary | ICD-10-CM | POA: Diagnosis not present

## 2016-06-18 DIAGNOSIS — M25512 Pain in left shoulder: Secondary | ICD-10-CM | POA: Diagnosis not present

## 2016-06-18 DIAGNOSIS — M542 Cervicalgia: Secondary | ICD-10-CM | POA: Diagnosis not present

## 2016-06-18 DIAGNOSIS — M6281 Muscle weakness (generalized): Secondary | ICD-10-CM | POA: Diagnosis not present

## 2016-06-25 DIAGNOSIS — M542 Cervicalgia: Secondary | ICD-10-CM | POA: Diagnosis not present

## 2016-06-25 DIAGNOSIS — M25512 Pain in left shoulder: Secondary | ICD-10-CM | POA: Diagnosis not present

## 2016-06-25 DIAGNOSIS — M6281 Muscle weakness (generalized): Secondary | ICD-10-CM | POA: Diagnosis not present

## 2016-07-09 DIAGNOSIS — M6281 Muscle weakness (generalized): Secondary | ICD-10-CM | POA: Diagnosis not present

## 2016-07-09 DIAGNOSIS — M25512 Pain in left shoulder: Secondary | ICD-10-CM | POA: Diagnosis not present

## 2016-07-09 DIAGNOSIS — M542 Cervicalgia: Secondary | ICD-10-CM | POA: Diagnosis not present

## 2016-07-25 DIAGNOSIS — L309 Dermatitis, unspecified: Secondary | ICD-10-CM | POA: Diagnosis not present

## 2016-08-27 DIAGNOSIS — R928 Other abnormal and inconclusive findings on diagnostic imaging of breast: Secondary | ICD-10-CM | POA: Diagnosis not present

## 2016-08-27 DIAGNOSIS — Z853 Personal history of malignant neoplasm of breast: Secondary | ICD-10-CM | POA: Diagnosis not present

## 2016-09-08 ENCOUNTER — Emergency Department (HOSPITAL_COMMUNITY): Payer: Medicare Other

## 2016-09-08 ENCOUNTER — Encounter (HOSPITAL_COMMUNITY): Payer: Self-pay | Admitting: Emergency Medicine

## 2016-09-08 ENCOUNTER — Emergency Department (HOSPITAL_COMMUNITY)
Admission: EM | Admit: 2016-09-08 | Discharge: 2016-09-09 | Disposition: A | Discharge status: Home / Self Care | Payer: Medicare Other | Attending: Emergency Medicine | Admitting: Emergency Medicine

## 2016-09-08 DIAGNOSIS — R0789 Other chest pain: Secondary | ICD-10-CM | POA: Diagnosis not present

## 2016-09-08 DIAGNOSIS — I10 Essential (primary) hypertension: Secondary | ICD-10-CM | POA: Diagnosis not present

## 2016-09-08 DIAGNOSIS — Z79899 Other long term (current) drug therapy: Secondary | ICD-10-CM | POA: Diagnosis not present

## 2016-09-08 DIAGNOSIS — R079 Chest pain, unspecified: Secondary | ICD-10-CM | POA: Insufficient documentation

## 2016-09-08 DIAGNOSIS — F17200 Nicotine dependence, unspecified, uncomplicated: Secondary | ICD-10-CM | POA: Insufficient documentation

## 2016-09-08 LAB — BASIC METABOLIC PANEL
Anion gap: 10 (ref 5–15)
BUN: 11 mg/dL (ref 6–20)
CO2: 28 mmol/L (ref 22–32)
Calcium: 9.7 mg/dL (ref 8.9–10.3)
Chloride: 102 mmol/L (ref 101–111)
Creatinine, Ser: 0.87 mg/dL (ref 0.44–1.00)
GFR calc Af Amer: >60 mL/min (ref >60)
GFR calc non Af Amer: >60 mL/min (ref >60)
Glucose, Bld: 114 mg/dL — ABNORMAL HIGH (ref 65–99)
Potassium: 3.5 mmol/L (ref 3.5–5.1)
Sodium: 140 mmol/L (ref 135–145)

## 2016-09-08 LAB — CBC
HCT: 42.9 % (ref 36.0–46.0)
Hemoglobin: 14.9 g/dL (ref 12.0–15.0)
MCH: 31.9 pg (ref 26.0–34.0)
MCHC: 34.7 g/dL (ref 30.0–36.0)
MCV: 91.9 fL (ref 78.0–100.0)
Platelets: 267 10*3/uL (ref 150–400)
RBC: 4.67 MIL/uL (ref 3.87–5.11)
RDW: 13 % (ref 11.5–15.5)
WBC: 7.4 10*3/uL (ref 4.0–10.5)

## 2016-09-08 LAB — POCT I-STAT TROPONIN I: Troponin i, poc: 0 ng/mL (ref 0.00–0.08)

## 2016-09-08 NOTE — ED Provider Notes (Signed)
[facility] DEPT Provider Note   CSN: [id number] Arrival date & time: 09/08/16  2107     History   Chief Complaint Chief Complaint  Patient presents with  . Chest Pain    HPI Leslie [name] is a 71 y.o. female.  Patient is a 71 year old female with past medical history of hypertension, tobacco use. She presents for evaluation of chest discomfort. This started yesterday evening. It is located to the left of the center of her chest and described as a pressure. This is worse when she remains still and improves when she ambulates and moves around. She denies any shortness of breath, nausea, diaphoresis, or radiation to the arm or jaw. She does have a cough that she has had recently that is nonproductive. She has no fevers.  She has no prior cardiac history.   The history is provided by the patient.  Chest Pain   This is a new problem. The current episode started yesterday. The problem occurs constantly. The problem has been gradually worsening. The pain is associated with rest. The pain is present in the substernal region. The pain is moderate. The quality of the pain is described as heavy. The pain does not radiate. Duration of episode(s) is 1 day. Associated symptoms include cough. Pertinent negatives include no fever, no malaise/fatigue, no nausea and no shortness of breath. She has tried nothing for the symptoms.    Past Medical History:  Diagnosis Date  . Benign breast cyst in female    Right breast  . Breast cancer ([place]) 2011   Left Breast Cancer  . Depression   . Hypertension     Patient Active Problem List   Diagnosis Date Noted  . Breast cancer, left breast ([place]) 02/09/2013  . Hot flashes 11/26/2010    Past Surgical History:  Procedure Laterality Date  . ABDOMINAL HYSTERECTOMY    . BREAST CYST EXCISION Right   . BREAST LUMPECTOMY Left 11/23/2008  . TUBAL LIGATION      OB History    Gravida Para Term Preterm AB Living   1             SAB TAB Ectopic  Multiple Live Births                   Home Medications    Prior to Admission medications   Medication Sig Start Date End Date Taking? Authorizing Provider  atenolol-chlorthalidone (TENORETIC) 50-25 MG per tablet Take 1 tablet by mouth daily.      [provider]  Bioflavonoid Products (PERIDIN-C PO) Take by mouth daily.    [provider]  calcium-vitamin D (OSCAL WITH D) 500-200 MG-UNIT per tablet Take 1 tablet by mouth daily.      [provider]  Evening Primrose topical oil Apply topically as needed.    [provider]  FOLIC ACID PO Take by mouth daily.      [provider]  letrozole (FEMARA) 2.5 MG tablet Take 1 tablet (2.5 mg total) by mouth daily. 06/15/13   Magrinat, Virgie Dad, MD  Multiple Vitamin (MULTIVITAMIN PO) Take by mouth daily.      [provider]  Durenda Guthrie 14431 UNT/0.65ML injection  01/28/12   [provider]    Family History Family History  Problem Relation Age of Onset  . Hypertension Mother   . Seizures Mother   . Heart disease Father   . Hypertension Sister     Social History Social History  Substance Use Topics  .  Smoking status: Current Every Day Smoker    Packs/day: 0.50  . Smokeless tobacco: Never Used     Comment: 1/2 pack per day  . Alcohol use Yes     Comment: glass of wine maybe every 4 months     Allergies   Aspirin   Review of Systems Review of Systems  Constitutional: Negative for fever and malaise/fatigue.  Respiratory: Positive for cough. Negative for shortness of breath.   Cardiovascular: Positive for chest pain.  Gastrointestinal: Negative for nausea.  All other systems reviewed and are negative.    Physical Exam Updated Vital Signs BP (!) 143/63 (BP Location: Left Arm)   Pulse 78   Temp 99 F (37.2 C) (Oral)   Resp 18   Ht 5\' 4"  (1.626 m)   Wt 61.7 kg (136 lb)   SpO2 98%   BMI 23.34 kg/m   Physical Exam  Constitutional: She is oriented to person,  place, and time. She appears well-developed and well-nourished. No distress.  HENT:  Head: Normocephalic and atraumatic.  Mouth/Throat: Oropharynx is clear and moist.  Neck: Normal range of motion. Neck supple.  Cardiovascular: Normal rate and regular rhythm.  Exam reveals no gallop and no friction rub.   No murmur heard. Pulmonary/Chest: Effort normal and breath sounds normal. No respiratory distress. She has no wheezes.  Abdominal: Soft. Bowel sounds are normal. She exhibits no distension. There is no tenderness.  Musculoskeletal: Normal range of motion. She exhibits no edema.  There is no calf tenderness or swelling.  Neurological: She is alert and oriented to person, place, and time.  Skin: Skin is warm and dry. She is not diaphoretic.  Nursing note and vitals reviewed.    ED Treatments / Results  Labs (all labs ordered are listed, but only abnormal results are displayed) Labs Reviewed  BASIC METABOLIC PANEL  CBC  I-STAT TROPONIN, ED    EKG  EKG Interpretation  Date/Time:  Sunday September 08 2016 21:15:35 EDT Ventricular Rate:  73 PR Interval:    QRS Duration: 87 QT Interval:  396 QTC Calculation: 437 R Axis:   63 Text Interpretation:  Sinus rhythm Ventricular premature complex pvc is new since last tracing Confirmed by Dorie Rank [id number]) on 09/08/2016 9:19:41 PM       Radiology Dg Chest 2 View  Result Date: 09/08/2016 CLINICAL DATA:  Chest pain.  History of left breast carcinoma EXAM: CHEST  2 VIEW COMPARISON:  May 25, 2013 FINDINGS: There is no edema or consolidation. 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 Heart size and pulmonary vascularity are normal. No adenopathy. There is aortic atherosclerosis. There is thoracolumbar dextroscoliosis with upper lumbar levoscoliosis. There is postoperative change in the left axillary region. There are no blastic or lytic bone lesions. IMPRESSION: No edema or consolidation. Stable cardiac silhouette. There is  aortic atherosclerosis. No evident adenopathy. Aortic Atherosclerosis (ICD10-I70.0). Electronically Signed   By: Lowella Grip III M.D.   On: 09/08/2016 21:51    Procedures Procedures (including critical care time)  Medications Ordered in ED Medications - No data to display   Initial Impression / Assessment and Plan / ED Course  I have reviewed the triage vital signs and the nursing notes.  Pertinent labs & imaging results that were available during my care of the patient were reviewed by me and considered in my medical decision making (see chart for details).  Patient presents with discomfort in her chest that is hard for her to describe in word.  It is worse when she is still and improves when she stands and ambulates. His EKG is unchanged from prior studies and troponin is negative. Chest x-ray is clear. She does report persistent cough for the past several days. It is possible that this may be related to a bronchitis. She will be treated with NSAIDs, Zithromax, and follow-up as needed.  Final Clinical Impressions(s) / ED Diagnoses   Final diagnoses:  None    New Prescriptions New Prescriptions   No medications on file     Veryl Speak, MD 09/09/16 [phone]

## 2016-09-08 NOTE — ED Triage Notes (Signed)
Pt comes in with complaints of left sided chest pain since last night.  Hx of indigestion but took some Tums without relief. Pt states she was recently around someone with a cough and now has a productive one and is unsure if that is related. A&O x4.  Ambulatory. In no acute distress at this time.

## 2016-09-09 MED ORDER — AZITHROMYCIN 250 MG PO TABS: ORAL_TABLET | ORAL | 0 refills | Status: DC

## 2016-09-09 NOTE — Discharge Instructions (Signed)
Ibuprofen 600 mg 3 times daily as needed for pain.  Zithromax as prescribed.  Follow-up with cardiology to discuss a stress test. The contact information for the [facility] cardiology clinic and has been provided in this discharge summary for you to call and make these arrangements.

## 2016-09-11 DIAGNOSIS — R05 Cough: Secondary | ICD-10-CM | POA: Diagnosis not present

## 2016-10-14 DIAGNOSIS — Z23 Encounter for immunization: Secondary | ICD-10-CM | POA: Diagnosis not present

## 2016-11-12 DIAGNOSIS — H01004 Unspecified blepharitis left upper eyelid: Secondary | ICD-10-CM | POA: Diagnosis not present

## 2017-03-03 DIAGNOSIS — R928 Other abnormal and inconclusive findings on diagnostic imaging of breast: Secondary | ICD-10-CM | POA: Diagnosis not present

## 2017-03-03 DIAGNOSIS — Z853 Personal history of malignant neoplasm of breast: Secondary | ICD-10-CM | POA: Diagnosis not present

## 2017-03-31 DIAGNOSIS — I10 Essential (primary) hypertension: Secondary | ICD-10-CM | POA: Diagnosis not present

## 2017-03-31 DIAGNOSIS — G4700 Insomnia, unspecified: Secondary | ICD-10-CM | POA: Diagnosis not present

## 2017-03-31 DIAGNOSIS — R7301 Impaired fasting glucose: Secondary | ICD-10-CM | POA: Diagnosis not present

## 2017-03-31 DIAGNOSIS — Z1211 Encounter for screening for malignant neoplasm of colon: Secondary | ICD-10-CM | POA: Diagnosis not present

## 2017-03-31 DIAGNOSIS — Z853 Personal history of malignant neoplasm of breast: Secondary | ICD-10-CM | POA: Diagnosis not present

## 2017-03-31 DIAGNOSIS — Z720 Tobacco use: Secondary | ICD-10-CM | POA: Diagnosis not present

## 2017-03-31 DIAGNOSIS — J301 Allergic rhinitis due to pollen: Secondary | ICD-10-CM | POA: Diagnosis not present

## 2017-03-31 DIAGNOSIS — Z0000 Encounter for general adult medical examination without abnormal findings: Secondary | ICD-10-CM | POA: Diagnosis not present

## 2017-04-22 ENCOUNTER — Other Ambulatory Visit: Payer: Self-pay | Admitting: Family Medicine

## 2017-04-22 ENCOUNTER — Ambulatory Visit
Admission: RE | Admit: 2017-04-22 | Discharge: 2017-04-22 | Disposition: A | Discharge status: Home / Self Care | Payer: Medicare Other | Source: Ambulatory Visit | Attending: Family Medicine | Admitting: Family Medicine

## 2017-04-22 DIAGNOSIS — M722 Plantar fascial fibromatosis: Secondary | ICD-10-CM | POA: Diagnosis not present

## 2017-04-22 DIAGNOSIS — M79672 Pain in left foot: Secondary | ICD-10-CM

## 2017-04-25 DIAGNOSIS — Z961 Presence of intraocular lens: Secondary | ICD-10-CM | POA: Diagnosis not present

## 2017-04-25 DIAGNOSIS — H01004 Unspecified blepharitis left upper eyelid: Secondary | ICD-10-CM | POA: Diagnosis not present

## 2017-04-25 DIAGNOSIS — H26493 Other secondary cataract, bilateral: Secondary | ICD-10-CM | POA: Diagnosis not present

## 2017-05-15 ENCOUNTER — Encounter: Payer: Self-pay | Admitting: Podiatry

## 2017-05-15 ENCOUNTER — Ambulatory Visit (INDEPENDENT_AMBULATORY_CARE_PROVIDER_SITE_OTHER): Payer: Medicare Other | Admitting: Podiatry

## 2017-05-15 ENCOUNTER — Ambulatory Visit (INDEPENDENT_AMBULATORY_CARE_PROVIDER_SITE_OTHER): Payer: Medicare Other

## 2017-05-15 DIAGNOSIS — M722 Plantar fascial fibromatosis: Secondary | ICD-10-CM

## 2017-05-15 DIAGNOSIS — I739 Peripheral vascular disease, unspecified: Secondary | ICD-10-CM | POA: Diagnosis not present

## 2017-05-15 DIAGNOSIS — M79672 Pain in left foot: Secondary | ICD-10-CM

## 2017-05-15 MED ORDER — DICLOFENAC SODIUM 1 % TD GEL: 2.0000 g | Freq: Four times a day (QID) | TRANSDERMAL | 2 refills | Status: DC

## 2017-05-15 NOTE — Progress Notes (Signed)
Subjective:   Patient ID: Leslie [name], female   DOB: 72 y.o.   MRN: [id number]   HPI 72 year old female presents the office with concerns of pain to her left foot.  She states that it hurts in the bottom of her foot as well as the back of the heel this been ongoing for about 2 months.  She has seen her primary care physician she was given prednisone for this however she says it did not help.  She denies any recent injury or trauma.  She is also concerned that she has started noticed discoloration, dark discoloration to the bottom of her feet mostly to the heel as well as her big toe.  She does smoke about half a pack per day.  She denies any claudication symptoms otherwise.  She says that she gets pain to her left foot mostly when she first gets up after being on her feet all day.  She has no other concerns.   Review of Systems  All other systems reviewed and are negative.  Past Medical History:  Diagnosis Date  . Benign breast cyst in female    Right breast  . Breast cancer ([place]) 2011   Left Breast Cancer  . Depression   . Hypertension     Past Surgical History:  Procedure Laterality Date  . ABDOMINAL HYSTERECTOMY    . BREAST CYST EXCISION Right   . BREAST LUMPECTOMY Left 11/23/2008  . TUBAL LIGATION       Current Outpatient Medications:  .  atenolol-chlorthalidone (TENORETIC) 50-25 MG per tablet, Take 1 tablet by mouth daily.  , Disp: , Rfl:  .  azithromycin (ZITHROMAX Z-PAK) 250 MG tablet, 2 po day one, then 1 daily x 4 days, Disp: 6 tablet, Rfl: 0 .  Bioflavonoid Products (PERIDIN-C PO), Take by mouth daily., Disp: , Rfl:  .  calcium-vitamin D (OSCAL WITH D) 500-200 MG-UNIT per tablet, Take 1 tablet by mouth daily.  , Disp: , Rfl:  .  Evening Primrose topical oil, Apply topically as needed., Disp: , Rfl:  .  FOLIC ACID PO, Take by mouth daily.  , Disp: , Rfl:  .  levocetirizine (XYZAL) 5 MG tablet, every evening., Disp: , Rfl: 3 .  Multiple Vitamin (MULTIVITAMIN PO), Take  by mouth daily.  , Disp: , Rfl:  .  OLANZapine (ZYPREXA) 2.5 MG tablet, TAKE 1 TABLET BY MOUTH IN THE EVENING AS NEEDED FOR INSOMNIA, Disp: , Rfl: 3 .  diclofenac sodium (VOLTAREN) 1 % GEL, Apply 2 g topically 4 (four) times daily. Rub into affected area of foot 2 to 4 times daily, Disp: 100 g, Rfl: 2 .  letrozole (FEMARA) 2.5 MG tablet, Take 1 tablet (2.5 mg total) by mouth daily. (Patient not taking: Reported on 05/15/2017), Disp: 90 tablet, Rfl: 2 .  ZOSTAVAX 19379 UNT/0.65ML injection, , Disp: , Rfl:   Allergies  Allergen Reactions  . Aspirin     nausea    Social History   Socioeconomic History  . Marital status: Widowed    Spouse name: Not on file  . Number of children: Not on file  . Years of education: Not on file  . Highest education level: Not on file  Occupational History  . Not on file  Social Needs  . Financial resource strain: Not on file  . Food insecurity:    Worry: Not on file    Inability: Not on file  . Transportation needs:    Medical: Not on file  Non-medical: Not on file  Tobacco Use  . Smoking status: Current Every Day Smoker    Packs/day: 0.50  . Smokeless tobacco: Never Used  . Tobacco comment: 1/2 pack per day  Substance and Sexual Activity  . Alcohol use: Yes    Comment: glass of wine maybe every 4 months  . Drug use: No  . Sexual activity: Never    Birth control/protection: Post-menopausal, Surgical  Lifestyle  . Physical activity:    Days per week: Not on file    Minutes per session: Not on file  . Stress: Not on file  Relationships  . Social connections:    Talks on phone: Not on file    Gets together: Not on file    Attends religious service: Not on file    Active member of club or organization: Not on file    Attends meetings of clubs or organizations: Not on file    Relationship status: Not on file  . Intimate partner violence:    Fear of current or ex partner: Not on file    Emotionally abused: Not on file    Physically  abused: Not on file    Forced sexual activity: Not on file  Other Topics Concern  . Not on file  Social History Narrative  . Not on file         Objective:  Physical Exam  General: AAO x3, NAD  Dermatological: Dark discoloration to the plantar heels with the distal left hallux.  No other skin lesions are identified or discoloration.  Vascular: DP pulses 2/4 bilaterally, PT pulse right side 2/4, Left 1/4.   Neruologic: Grossly intact via light touch bilateral.  Protective threshold with Semmes Wienstein monofilament intact to all pedal sites bilateral.   Musculoskeletal: There is tenderness palpation of the plantar medial tubercle of the calcaneus at the insertion of the plantar base on the left foot.  Also mild discomfort on the medial band within the arch of the foot.  Also mild discomfort to the distal portion of the Achilles tendon only insertion into the calcaneus.  Thompson test is negative and Achilles tendon appears to be intact.  No pain with lateral compression of calcaneus.  No other area pinpoint but intermittent pain to vibratory sensation.  Muscular strength 5/5 in all groups tested bilateral.  Gait: Unassisted, Nonantalgic.      Assessment:   Left foot plantar fasciitis, Achilles tendinitis; skin discoloration     Plan:  -Treatment options discussed including all alternatives, risks, and complications -Etiology of symptoms were discussed -X-rays were obtained and reviewed with the patient.  No evidence of acute fracture or stress fracture identified -I did an ABI in the office.  The left side is 1.01 right 1.13.  However I am still concerned with the discoloration to her skin as well as decreased posterior tibial pulse.  I did order an arterial duplex for her today. -Prednisone was not helpful and she cannot take oral anti-inflammatories as makes her sick.  Prescribed topical Voltaren gel.  Given concern for her circulation with a steroid injection to look at the  arterial duplex. -Discussed stretching, icing exercises daily. -Plantar fascial brace dispensed -Continue supportive shoes -Follow-up in 3 weeks or sooner if needed.  Call any questions or concerns.  Trula Slade DPM  -

## 2017-05-16 ENCOUNTER — Telehealth: Payer: Self-pay | Admitting: *Deleted

## 2017-05-16 DIAGNOSIS — R0989 Other specified symptoms and signs involving the circulatory and respiratory systems: Secondary | ICD-10-CM

## 2017-05-16 DIAGNOSIS — L819 Disorder of pigmentation, unspecified: Secondary | ICD-10-CM

## 2017-05-16 NOTE — Telephone Encounter (Signed)
-----   Message from Trula Slade, DPM sent at 05/15/2017 10:59 AM EDT ----- Can you please order an arterial duplex due to discoloration of skin left heel and decreased PT pulse: abi normal in the office. Thanks.

## 2017-05-19 ENCOUNTER — Other Ambulatory Visit: Payer: Self-pay | Admitting: Podiatry

## 2017-05-19 DIAGNOSIS — R0989 Other specified symptoms and signs involving the circulatory and respiratory systems: Secondary | ICD-10-CM

## 2017-05-19 DIAGNOSIS — L819 Disorder of pigmentation, unspecified: Secondary | ICD-10-CM

## 2017-05-21 ENCOUNTER — Encounter (HOSPITAL_COMMUNITY): Payer: Medicare Other

## 2017-05-30 ENCOUNTER — Telehealth: Payer: Self-pay | Admitting: Podiatry

## 2017-05-30 MED ORDER — NONFORMULARY OR COMPOUNDED ITEM: 2 refills | Status: DC

## 2017-05-30 NOTE — Telephone Encounter (Signed)
Patient doesn't think the medication is working. She wants to know if there is another alternative? If you can call her back at [phone]

## 2017-05-30 NOTE — Telephone Encounter (Signed)
I informed pt of Dr. Leigh Aurora orders for the topical antiinflammatory from [facility] [phone], and explained it had multiple pain medications and the antiinflammatory property too. I told pt to continue the diclofenac gel until the antiinflammatory cream arrived. Pt states she has the circulation test on 06/02/2017.

## 2017-05-30 NOTE — Addendum Note (Signed)
Addended by: Harriett Sine D on: 05/30/2017 03:42 PM   Modules accepted: Orders

## 2017-05-30 NOTE — Telephone Encounter (Signed)
I know she had done prednisone before but we can do a longer tapered course in combination with everything as well (as long as she is not diabetic)  Also see previous message

## 2017-05-30 NOTE — Telephone Encounter (Signed)
We can do a topical anti-inflammatory cream through [facility]. Also, did she get her circulation tests scheduled?

## 2017-06-02 ENCOUNTER — Ambulatory Visit (HOSPITAL_COMMUNITY)
Admission: RE | Admit: 2017-06-02 | Discharge: 2017-06-02 | Disposition: A | Discharge status: Home / Self Care | Payer: Medicare Other | Source: Ambulatory Visit | Attending: Cardiovascular Disease | Admitting: Cardiovascular Disease

## 2017-06-02 DIAGNOSIS — R0989 Other specified symptoms and signs involving the circulatory and respiratory systems: Secondary | ICD-10-CM

## 2017-06-02 DIAGNOSIS — L819 Disorder of pigmentation, unspecified: Secondary | ICD-10-CM | POA: Insufficient documentation

## 2017-06-02 DIAGNOSIS — I70203 Unspecified atherosclerosis of native arteries of extremities, bilateral legs: Secondary | ICD-10-CM | POA: Insufficient documentation

## 2017-06-04 ENCOUNTER — Ambulatory Visit (INDEPENDENT_AMBULATORY_CARE_PROVIDER_SITE_OTHER): Payer: Medicare Other | Admitting: Cardiovascular Disease

## 2017-06-04 ENCOUNTER — Encounter: Payer: Self-pay | Admitting: Cardiovascular Disease

## 2017-06-04 DIAGNOSIS — I739 Peripheral vascular disease, unspecified: Secondary | ICD-10-CM | POA: Diagnosis not present

## 2017-06-04 DIAGNOSIS — Z720 Tobacco use: Secondary | ICD-10-CM | POA: Diagnosis not present

## 2017-06-04 DIAGNOSIS — I10 Essential (primary) hypertension: Secondary | ICD-10-CM | POA: Diagnosis not present

## 2017-06-04 NOTE — Assessment & Plan Note (Addendum)
History of essential hypertension with blood pressure measured today at 144 74 he is on atenolol and chlorthalidone.  Continue current meds at current dosing.

## 2017-06-04 NOTE — Addendum Note (Signed)
Addended by: Therisa Doyne on: 06/04/2017 03:06 PM   Modules accepted: Orders

## 2017-06-04 NOTE — Assessment & Plan Note (Signed)
History of ongoing tobacco abuse of 1/2 pack/day recalcitrant to risk factor modification. 

## 2017-06-04 NOTE — Progress Notes (Signed)
06/04/2017 Leslie [name]   [birth year]  [id number]  Primary Physician Leslie Neer, MD Primary Cardiologist: Leslie Harp MD Leslie [name], [place]  HPI:  Leslie [name] is a 72 y.o. thin appearing widowed African-American female mother of 56, grandmother of one grandchild who is retired from being a Engineer, mining at [employer].  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 Jacqualyn [name] , her podiatrist, for peripheral vascular evaluation because of lifestyle limiting claudication.  Risk factors include treated hypertension and tobacco abuse is never had a heart attack or stroke.  She denies chest pain or shortness of breath.  She said lifestyle limiting claudication of the left side for last 3 months with recent Dopplers that were performed 06/02/2017 revealing a high-frequency signal in her distal left SFA.   Current Meds  Medication Sig  . atenolol-chlorthalidone (TENORETIC) 50-25 MG per tablet Take 1 tablet by mouth daily.    Marland Kitchen azithromycin (ZITHROMAX Z-PAK) 250 MG tablet 2 po day one, then 1 daily x 4 days  . Bioflavonoid Products (PERIDIN-C PO) Take by mouth daily.  . calcium-vitamin D (OSCAL WITH D) 500-200 MG-UNIT per tablet Take 1 tablet by mouth daily.    . diclofenac sodium (VOLTAREN) 1 % GEL Apply 2 g topically 4 (four) times daily. Rub into affected area of foot 2 to 4 times daily  . Evening Primrose topical oil Apply topically as needed.  Marland Kitchen FOLIC ACID PO Take by mouth daily.    Marland Kitchen letrozole (FEMARA) 2.5 MG tablet Take 1 tablet (2.5 mg total) by mouth daily.  Marland Kitchen levocetirizine (XYZAL) 5 MG tablet every evening.  . Multiple Vitamin (MULTIVITAMIN PO) Take by mouth daily.    . NONFORMULARY OR COMPOUNDED ITEM [facility]:  Antiinflammatory Cream - Diclofenac 3%, Baclofen 2%, Lidocaine 2%, apply 1-2 grams to affected area 3-4 times daily.  Marland Kitchen OLANZapine (ZYPREXA) 2.5 MG tablet TAKE 1 TABLET BY MOUTH IN THE EVENING AS NEEDED FOR INSOMNIA  . ZOSTAVAX 34193 UNT/0.65ML injection       Allergies  Allergen Reactions  . Aspirin     nausea    Social History   Socioeconomic History  . Marital status: Widowed    Spouse name: Not on file  . Number of children: Not on file  . Years of education: Not on file  . Highest education level: Not on file  Occupational History  . Not on file  Social Needs  . Financial resource strain: Not on file  . Food insecurity:    Worry: Not on file    Inability: Not on file  . Transportation needs:    Medical: Not on file    Non-medical: Not on file  Tobacco Use  . Smoking status: Current Every Day Smoker    Packs/day: 0.50  . Smokeless tobacco: Never Used  . Tobacco comment: 1/2 pack per day  Substance and Sexual Activity  . Alcohol use: Yes    Comment: glass of wine maybe every 4 months  . Drug use: No  . Sexual activity: Never    Birth control/protection: Post-menopausal, Surgical  Lifestyle  . Physical activity:    Days per week: Not on file    Minutes per session: Not on file  . Stress: Not on file  Relationships  . Social connections:    Talks on phone: Not on file    Gets together: Not on file    Attends religious service: Not on file    Active member of club or  organization: Not on file    Attends meetings of clubs or organizations: Not on file    Relationship status: Not on file  . Intimate partner violence:    Fear of current or ex partner: Not on file    Emotionally abused: Not on file    Physically abused: Not on file    Forced sexual activity: Not on file  Other Topics Concern  . Not on file  Social History Narrative  . Not on file     Review of Systems: General: negative for chills, fever, night sweats or weight changes.  Cardiovascular: negative for chest pain, dyspnea on exertion, edema, orthopnea, palpitations, paroxysmal nocturnal dyspnea or shortness of breath Dermatological: negative for rash Respiratory: negative for cough or wheezing Urologic: negative for hematuria Abdominal:  negative for nausea, vomiting, diarrhea, bright red blood per rectum, melena, or hematemesis Neurologic: negative for visual changes, syncope, or dizziness All other systems reviewed and are otherwise negative except as noted above.    Blood pressure (!) 144/74, pulse 64, height 5' 3.75" (1.619 m), weight 130 lb 9.6 oz (59.2 kg), unknown if currently breastfeeding.  General appearance: alert and no distress Neck: no adenopathy, no carotid bruit, no JVD, supple, symmetrical, trachea midline and thyroid not enlarged, symmetric, no tenderness/mass/nodules Lungs: clear to auscultation bilaterally Heart: regular rate and rhythm, S1, S2 normal, no murmur, click, rub or gallop Extremities: extremities normal, atraumatic, no cyanosis or edema Pulses: 2+ and symmetric Skin: Skin color, texture, turgor normal. No rashes or lesions Neurologic: Alert and oriented X 3, normal strength and tone. Normal symmetric reflexes. Normal coordination and gait  EKG sinus rhythm at 64 without ST or T wave changes.  I personally reviewed this EKG  ASSESSMENT AND PLAN:   Essential hypertension History of essential hypertension with blood pressure measured today at 144 74 he is on atenolol and chlorthalidone.  Continue current meds at current dosing.  Peripheral arterial disease ([place]) 3 months history of left lower extremely claudication with heel pain.  Recent Dopplers performed in the office on 06/02/2017 revealed a right ABI of 1.21 and a left 0.96 the high-frequency signal in her distal left SFA . we will arrange for her to undergo angiography and potential endovascular therapy.  Tobacco abuse History of ongoing tobacco abuse of 1/2 pack/day recalcitrant to risk factor modification.      Leslie Harp MD FACP,FACC,FAHA, [facility] 06/04/2017 2:58 PM

## 2017-06-04 NOTE — Patient Instructions (Signed)
   [facility] [address] [place] [address] Dept: [phone] Loc: [phone]  JULLIAN PREVITI  06/04/2017  You are scheduled for a Peripheral Angiogram on Thursday, May 30 with Dr. Quay Burow.  1. Please arrive at the [facility] (Main Entrance A) at [facility]: [address] [place], [place] [address] at 7:30 AM (two hours before your procedure to ensure your preparation). Free valet parking service is available.   Special note: Every effort is made to have your procedure done on time. Please understand that emergencies sometimes delay scheduled procedures.  2. Diet: Do not eat or drink anything after midnight prior to your procedure except sips of water to take medications.  3. Labs: Please have blood work drawn in the office today.  4. Medication instructions in preparation for your procedure:  On the morning of your procedure, take any morning medicines.  You may use sips of water.  5. Plan for one night stay--bring personal belongings. 6. Bring a current list of your medications and current insurance cards. 7. You MUST have a responsible person to drive you home. 8. Someone MUST be with you the first 24 hours after you arrive home or your discharge will be delayed. 9. Please wear clothes that are easy to get on and off and wear slip-on shoes.  Thank you for allowing [place] to care for you!   -- [facility]

## 2017-06-04 NOTE — Assessment & Plan Note (Signed)
3 months history of left lower extremely claudication with heel pain.  Recent Dopplers performed in the office on 06/02/2017 revealed a right ABI of 1.21 and a left 0.96 the high-frequency signal in her distal left SFA . we will arrange for her to undergo angiography and potential endovascular therapy.

## 2017-06-05 ENCOUNTER — Encounter: Payer: Self-pay | Admitting: Podiatry

## 2017-06-05 ENCOUNTER — Ambulatory Visit (INDEPENDENT_AMBULATORY_CARE_PROVIDER_SITE_OTHER): Payer: Medicare Other | Admitting: Podiatry

## 2017-06-05 DIAGNOSIS — M779 Enthesopathy, unspecified: Secondary | ICD-10-CM

## 2017-06-05 DIAGNOSIS — I739 Peripheral vascular disease, unspecified: Secondary | ICD-10-CM | POA: Diagnosis not present

## 2017-06-05 DIAGNOSIS — M722 Plantar fascial fibromatosis: Secondary | ICD-10-CM

## 2017-06-05 LAB — CBC WITH DIFFERENTIAL/PLATELET
Basophils Absolute: 0 10*3/uL (ref 0.0–0.2)
Basos: 0 %
EOS (ABSOLUTE): 0.1 10*3/uL (ref 0.0–0.4)
Eos: 1 %
Hematocrit: 42.3 % (ref 34.0–46.6)
Hemoglobin: 14.2 g/dL (ref 11.1–15.9)
Immature Grans (Abs): 0 10*3/uL (ref 0.0–0.1)
Immature Granulocytes: 0 %
Lymphocytes Absolute: 3.4 10*3/uL — ABNORMAL HIGH (ref 0.7–3.1)
Lymphs: 47 %
MCH: 31.6 pg (ref 26.6–33.0)
MCHC: 33.6 g/dL (ref 31.5–35.7)
MCV: 94 fL (ref 79–97)
Monocytes Absolute: 0.6 10*3/uL (ref 0.1–0.9)
Monocytes: 8 %
Neutrophils Absolute: 3.2 10*3/uL (ref 1.4–7.0)
Neutrophils: 44 %
Platelets: 268 10*3/uL (ref 150–379)
RBC: 4.49 x10E6/uL (ref 3.77–5.28)
RDW: 12.6 % (ref 12.3–15.4)
WBC: 7.3 10*3/uL (ref 3.4–10.8)

## 2017-06-05 LAB — BASIC METABOLIC PANEL
BUN/Creatinine Ratio: 13 (ref 12–28)
BUN: 11 mg/dL (ref 8–27)
CO2: 26 mmol/L (ref 20–29)
Calcium: 9.5 mg/dL (ref 8.7–10.3)
Chloride: 104 mmol/L (ref 96–106)
Creatinine, Ser: 0.86 mg/dL (ref 0.57–1.00)
GFR calc Af Amer: 79 mL/min/{1.73_m2} (ref >59)
GFR calc non Af Amer: 69 mL/min/{1.73_m2} (ref >59)
Glucose: 87 mg/dL (ref 65–99)
Potassium: 4.1 mmol/L (ref 3.5–5.2)
Sodium: 145 mmol/L — ABNORMAL HIGH (ref 134–144)

## 2017-06-05 LAB — PROTIME-INR
INR: 1 (ref 0.8–1.2)
Prothrombin Time: 10 s (ref 9.1–12.0)

## 2017-06-05 LAB — APTT: aPTT: 25 s (ref 24–33)

## 2017-06-05 NOTE — Addendum Note (Signed)
Addended by: Zebedee Iba on: 06/05/2017 04:34 PM   Modules accepted: Orders

## 2017-06-06 ENCOUNTER — Telehealth: Payer: Self-pay | Admitting: Cardiovascular Disease

## 2017-06-06 NOTE — Telephone Encounter (Signed)
Spoke to pt to reschedule procedure. Pt agreed to change time to 3:30PM with arrival to the hospital at 1:30PM.

## 2017-06-08 NOTE — Progress Notes (Signed)
Subjective: Leslie [name] presents the office today for follow-up evaluation of pain to her left foot.  She states that she did see Dr. Gwenlyn Found and she is scheduled for an angiogram however she still gets pain to her foot.  She states that she is having the bottom in the back of her heel as well.  No recent injury.  No changes. Denies any systemic complaints such as fevers, chills, nausea, vomiting. No acute changes since last appointment, and no other complaints at this time.   Objective: AAO x3, NAD DP/PT pulses decreased bilaterally There is tenderness palpation of still to the left foot and this is mostly along the plantar medial tubercle of the calcaneus at the insertion of plantar fascia is also given some pain to the insertion of the Achilles tendon into the posterior calcaneus.  There is no pain with lateral compression of the calcaneus.  There is no edema, erythema, increase in warmth.  Plantar fascia, Achilles tendon appears to be intact. No open lesions or pre-ulcerative lesions.  No pain with calf compression, swelling, warmth, erythema  Assessment: 72 year old female with PAD, tendinitis  Plan: -All treatment options discussed with the patient including all alternatives, risks, complications.  -I do think a lot of her symptoms are related to her PAD however still concern for tendinitis.  Discussed continue with stretching, icing daily.  Also dispensed a night splint.  Also continue supportive shoes and orthotics. -Follow-up in about 4 weeks or sooner if needed.  This will be after her procedure with Dr. Gwenlyn Found.  -Patient encouraged to call the office with any questions, concerns, change in symptoms.   Trula Slade DPM

## 2017-06-11 ENCOUNTER — Encounter: Payer: Self-pay | Admitting: Cardiovascular Disease

## 2017-06-17 ENCOUNTER — Other Ambulatory Visit: Payer: Self-pay | Admitting: Cardiovascular Disease

## 2017-06-17 DIAGNOSIS — I739 Peripheral vascular disease, unspecified: Secondary | ICD-10-CM

## 2017-06-19 ENCOUNTER — Encounter (HOSPITAL_COMMUNITY): Payer: Self-pay | Admitting: General Practice

## 2017-06-19 ENCOUNTER — Ambulatory Visit (HOSPITAL_COMMUNITY)
Admission: RE | Admit: 2017-06-19 | Discharge: 2017-06-20 | Disposition: A | Discharge status: Home / Self Care | Payer: Medicare Other | Source: Ambulatory Visit | Attending: Cardiovascular Disease | Admitting: Cardiovascular Disease

## 2017-06-19 ENCOUNTER — Encounter (HOSPITAL_COMMUNITY)
Admission: RE | Disposition: A | Discharge status: Home / Self Care | Payer: Self-pay | Source: Ambulatory Visit | Attending: Cardiovascular Disease

## 2017-06-19 ENCOUNTER — Other Ambulatory Visit: Payer: Self-pay

## 2017-06-19 DIAGNOSIS — E876 Hypokalemia: Secondary | ICD-10-CM | POA: Diagnosis not present

## 2017-06-19 DIAGNOSIS — I10 Essential (primary) hypertension: Secondary | ICD-10-CM | POA: Insufficient documentation

## 2017-06-19 DIAGNOSIS — I70212 Atherosclerosis of native arteries of extremities with intermittent claudication, left leg: Secondary | ICD-10-CM | POA: Insufficient documentation

## 2017-06-19 DIAGNOSIS — Z720 Tobacco use: Secondary | ICD-10-CM | POA: Insufficient documentation

## 2017-06-19 DIAGNOSIS — I739 Peripheral vascular disease, unspecified: Secondary | ICD-10-CM | POA: Diagnosis present

## 2017-06-19 HISTORY — DX: Peripheral vascular disease, unspecified: I73.9

## 2017-06-19 HISTORY — PX: LOWER EXTREMITY ANGIOGRAM: SHX5955

## 2017-06-19 HISTORY — PX: PERIPHERAL VASCULAR BALLOON ANGIOPLASTY: CATH118281

## 2017-06-19 HISTORY — PX: LOWER EXTREMITY ANGIOGRAPHY: CATH118251

## 2017-06-19 HISTORY — DX: Malignant neoplasm of unspecified site of left female breast: C50.912

## 2017-06-19 HISTORY — DX: Personal history of other medical treatment: Z92.89

## 2017-06-19 LAB — POCT ACTIVATED CLOTTING TIME: Activated Clotting Time: 169 seconds

## 2017-06-19 SURGERY — LOWER EXTREMITY ANGIOGRAPHY
Anesthesia: LOCAL

## 2017-06-19 MED ORDER — MORPHINE SULFATE (PF) 2 MG/ML IV SOLN: 2.0000 mg | INTRAVENOUS | Status: DC | PRN

## 2017-06-19 MED ORDER — LIDOCAINE HCL (PF) 1 % IJ SOLN
INTRAMUSCULAR | Status: AC
  Filled 2017-06-19: qty 30

## 2017-06-19 MED ORDER — ASPIRIN 81 MG PO CHEW: 81.0000 mg | CHEWABLE_TABLET | ORAL | Status: DC

## 2017-06-19 MED ORDER — SODIUM CHLORIDE 0.9 % IV SOLN: INTRAVENOUS | Status: AC

## 2017-06-19 MED ORDER — ANGIOPLASTY BOOK
Freq: Once | Status: DC
  Filled 2017-06-19: qty 1

## 2017-06-19 MED ORDER — HEPARIN SODIUM (PORCINE) 1000 UNIT/ML IJ SOLN
INTRAMUSCULAR | Status: AC
  Filled 2017-06-19: qty 1

## 2017-06-19 MED ORDER — SODIUM CHLORIDE 0.9% FLUSH: 3.0000 mL | Freq: Two times a day (BID) | INTRAVENOUS | Status: DC

## 2017-06-19 MED ORDER — LETROZOLE 2.5 MG PO TABS: 2.5000 mg | ORAL_TABLET | Freq: Every day | ORAL | Status: DC

## 2017-06-19 MED ORDER — FENTANYL CITRATE (PF) 100 MCG/2ML IJ SOLN
INTRAMUSCULAR | Status: DC | PRN
  Administered 2017-06-19: 25 ug via INTRAVENOUS

## 2017-06-19 MED ORDER — CLOPIDOGREL BISULFATE 300 MG PO TABS
ORAL_TABLET | ORAL | Status: AC
  Filled 2017-06-19: qty 1

## 2017-06-19 MED ORDER — ACETAMINOPHEN 325 MG PO TABS: 650.0000 mg | ORAL_TABLET | ORAL | Status: DC | PRN

## 2017-06-19 MED ORDER — ONDANSETRON HCL 4 MG/2ML IJ SOLN: 4.0000 mg | Freq: Four times a day (QID) | INTRAMUSCULAR | Status: DC | PRN

## 2017-06-19 MED ORDER — CHLORTHALIDONE 25 MG PO TABS
12.5000 mg | ORAL_TABLET | Freq: Every day | ORAL | Status: DC
  Administered 2017-06-20: 12.5 mg via ORAL
  Filled 2017-06-19: qty 0.5

## 2017-06-19 MED ORDER — FENTANYL CITRATE (PF) 100 MCG/2ML IJ SOLN
INTRAMUSCULAR | Status: AC
  Filled 2017-06-19: qty 2

## 2017-06-19 MED ORDER — HEPARIN SODIUM (PORCINE) 1000 UNIT/ML IJ SOLN
INTRAMUSCULAR | Status: DC | PRN
  Administered 2017-06-19: 7000 [IU] via INTRAVENOUS

## 2017-06-19 MED ORDER — IODIXANOL 320 MG/ML IV SOLN
INTRAVENOUS | Status: DC | PRN
  Administered 2017-06-19: 160 mL via INTRA_ARTERIAL

## 2017-06-19 MED ORDER — CLOPIDOGREL BISULFATE 75 MG PO TABS
75.0000 mg | ORAL_TABLET | Freq: Every day | ORAL | Status: DC
  Administered 2017-06-20: 75 mg via ORAL
  Filled 2017-06-19: qty 1

## 2017-06-19 MED ORDER — HEPARIN (PORCINE) IN NACL 2-0.9 UNITS/ML
INTRAMUSCULAR | Status: AC | PRN
  Administered 2017-06-19 (×2): 500 mL

## 2017-06-19 MED ORDER — SODIUM CHLORIDE 0.9 % WEIGHT BASED INFUSION
3.0000 mL/kg/h | INTRAVENOUS | Status: DC
  Administered 2017-06-19: 3 mL/kg/h via INTRAVENOUS

## 2017-06-19 MED ORDER — MIDAZOLAM HCL 2 MG/2ML IJ SOLN
INTRAMUSCULAR | Status: AC
  Filled 2017-06-19: qty 2

## 2017-06-19 MED ORDER — ATENOLOL 50 MG PO TABS
25.0000 mg | ORAL_TABLET | Freq: Every day | ORAL | Status: DC
  Administered 2017-06-20: 10:00:00 25 mg via ORAL
  Filled 2017-06-19 (×2): qty 1

## 2017-06-19 MED ORDER — ATROPINE SULFATE 1 MG/10ML IJ SOSY
PREFILLED_SYRINGE | INTRAMUSCULAR | Status: AC
  Filled 2017-06-19: qty 10

## 2017-06-19 MED ORDER — SODIUM CHLORIDE 0.9 % IV SOLN: 250.0000 mL | INTRAVENOUS | Status: DC | PRN

## 2017-06-19 MED ORDER — SODIUM CHLORIDE 0.9% FLUSH: 3.0000 mL | INTRAVENOUS | Status: DC | PRN

## 2017-06-19 MED ORDER — HEPARIN (PORCINE) IN NACL 1000-0.9 UT/500ML-% IV SOLN
INTRAVENOUS | Status: AC
  Filled 2017-06-19: qty 500

## 2017-06-19 MED ORDER — LIDOCAINE HCL (PF) 1 % IJ SOLN
INTRAMUSCULAR | Status: DC | PRN
  Administered 2017-06-19: 20 mL

## 2017-06-19 MED ORDER — LABETALOL HCL 5 MG/ML IV SOLN: 10.0000 mg | INTRAVENOUS | Status: DC | PRN

## 2017-06-19 MED ORDER — SODIUM CHLORIDE 0.9 % WEIGHT BASED INFUSION: 1.0000 mL/kg/h | INTRAVENOUS | Status: DC

## 2017-06-19 MED ORDER — MIDAZOLAM HCL 2 MG/2ML IJ SOLN
INTRAMUSCULAR | Status: DC | PRN
  Administered 2017-06-19: 1 mg via INTRAVENOUS

## 2017-06-19 MED ORDER — ATENOLOL-CHLORTHALIDONE 50-25 MG PO TABS: 0.5000 | ORAL_TABLET | Freq: Every day | ORAL | Status: DC

## 2017-06-19 MED ORDER — HYDRALAZINE HCL 20 MG/ML IJ SOLN: 5.0000 mg | INTRAMUSCULAR | Status: DC | PRN

## 2017-06-19 MED ORDER — CLOPIDOGREL BISULFATE 300 MG PO TABS
ORAL_TABLET | ORAL | Status: DC | PRN
  Administered 2017-06-19: 300 mg via ORAL

## 2017-06-19 SURGICAL SUPPLY — 22 items
BALLN CHOCOLATE 4.0X40X135 (BALLOONS) ×3
BALLN IN.PACT DCB 5X40 (BALLOONS) ×3
BALLOON CHOCOLATE 4.0X40X135 (BALLOONS) IMPLANT
CATH ANGIO 5F PIGTAIL 65CM (CATHETERS) ×1 IMPLANT
CATH STRAIGHT 5FR 65CM (CATHETERS) ×1 IMPLANT
CATH TEMPO 5F RIM 65CM (CATHETERS) ×1 IMPLANT
DCB IN.PACT 5X40 (BALLOONS) ×2 IMPLANT
GUIDEWIRE ANGLED .035X150CM (WIRE) ×1 IMPLANT
KIT ENCORE 26 ADVANTAGE (KITS) ×1 IMPLANT
KIT PV (KITS) ×3 IMPLANT
SHEATH PINNACLE 5F 10CM (SHEATH) ×1 IMPLANT
SHEATH PINNACLE 6F 10CM (SHEATH) ×1 IMPLANT
SHEATH PINNACLE ST 6F 45CM (SHEATH) ×1 IMPLANT
STOPCOCK MORSE 400PSI 3WAY (MISCELLANEOUS) ×1 IMPLANT
SYRINGE MEDRAD AVANTA MACH 7 (SYRINGE) ×2 IMPLANT
TAPE RADIOPAQUE TURBO (MISCELLANEOUS) ×1 IMPLANT
TRANSDUCER W/STOPCOCK (MISCELLANEOUS) ×3 IMPLANT
TRAY PV CATH (CUSTOM PROCEDURE TRAY) ×3 IMPLANT
TUBING CIL FLEX 10 FLL-RA (TUBING) ×3 IMPLANT
WIRE HITORQ VERSACORE ST 145CM (WIRE) ×1 IMPLANT
WIRE ROSEN-J .035X260CM (WIRE) ×1 IMPLANT
WIRE SPARTACORE .014X300CM (WIRE) ×3 IMPLANT

## 2017-06-19 NOTE — Progress Notes (Signed)
Site area: right groin  Site Prior to Removal:  Level 0  Pressure Applied For 30 MINUTES    Minutes Beginning at 1845  Manual:   Yes.    Patient Status During Pull:  WNL  Post Pull Groin Site:  Level 0  Post Pull Instructions Given:  Yes.    Post Pull Pulses Present:  Yes.    Dressing Applied:  Yes.    Comments:  Tolerated procedure well

## 2017-06-20 ENCOUNTER — Encounter (HOSPITAL_COMMUNITY): Payer: Self-pay | Admitting: Cardiovascular Disease

## 2017-06-20 ENCOUNTER — Other Ambulatory Visit: Payer: Self-pay | Admitting: Physician Assistant

## 2017-06-20 DIAGNOSIS — I739 Peripheral vascular disease, unspecified: Secondary | ICD-10-CM | POA: Diagnosis not present

## 2017-06-20 DIAGNOSIS — Z720 Tobacco use: Secondary | ICD-10-CM

## 2017-06-20 DIAGNOSIS — I10 Essential (primary) hypertension: Secondary | ICD-10-CM

## 2017-06-20 DIAGNOSIS — E876 Hypokalemia: Secondary | ICD-10-CM | POA: Diagnosis not present

## 2017-06-20 DIAGNOSIS — I70212 Atherosclerosis of native arteries of extremities with intermittent claudication, left leg: Secondary | ICD-10-CM | POA: Diagnosis not present

## 2017-06-20 LAB — BASIC METABOLIC PANEL
Anion gap: 5 (ref 5–15)
BUN: 10 mg/dL (ref 6–20)
CO2: 27 mmol/L (ref 22–32)
Calcium: 8.5 mg/dL — ABNORMAL LOW (ref 8.9–10.3)
Chloride: 107 mmol/L (ref 101–111)
Creatinine, Ser: 0.81 mg/dL (ref 0.44–1.00)
GFR calc Af Amer: >60 mL/min (ref >60)
GFR calc non Af Amer: >60 mL/min (ref >60)
Glucose, Bld: 114 mg/dL — ABNORMAL HIGH (ref 65–99)
Potassium: 3.2 mmol/L — ABNORMAL LOW (ref 3.5–5.1)
Sodium: 139 mmol/L (ref 135–145)

## 2017-06-20 LAB — CBC
HCT: 39.2 % (ref 36.0–46.0)
Hemoglobin: 13.2 g/dL (ref 12.0–15.0)
MCH: 31.1 pg (ref 26.0–34.0)
MCHC: 33.7 g/dL (ref 30.0–36.0)
MCV: 92.2 fL (ref 78.0–100.0)
Platelets: 254 10*3/uL (ref 150–400)
RBC: 4.25 MIL/uL (ref 3.87–5.11)
RDW: 12.3 % (ref 11.5–15.5)
WBC: 7.2 10*3/uL (ref 4.0–10.5)

## 2017-06-20 LAB — POCT ACTIVATED CLOTTING TIME: Activated Clotting Time: 246 seconds

## 2017-06-20 MED ORDER — POTASSIUM CHLORIDE CRYS ER 20 MEQ PO TBCR
40.0000 meq | EXTENDED_RELEASE_TABLET | Freq: Once | ORAL | Status: AC
  Administered 2017-06-20: 40 meq via ORAL
  Filled 2017-06-20: qty 2

## 2017-06-20 MED ORDER — ATORVASTATIN CALCIUM 20 MG PO TABS: 20.0000 mg | ORAL_TABLET | Freq: Every day | ORAL | 6 refills | Status: DC

## 2017-06-20 MED ORDER — POTASSIUM CHLORIDE CRYS ER 20 MEQ PO TBCR
40.0000 meq | EXTENDED_RELEASE_TABLET | Freq: Once | ORAL | Status: AC
Start: 2017-06-20 — End: 2017-06-20

## 2017-06-20 MED ORDER — ATORVASTATIN CALCIUM 20 MG PO TABS: 20.0000 mg | ORAL_TABLET | Freq: Every day | ORAL | Status: DC

## 2017-06-20 MED ORDER — CLOPIDOGREL BISULFATE 75 MG PO TABS: 75.0000 mg | ORAL_TABLET | Freq: Every day | ORAL | 11 refills | Status: DC

## 2017-06-20 MED FILL — Heparin Sod (Porcine)-NaCl IV Soln 1000 Unit/500ML-0.9%: INTRAVENOUS | Qty: 1000 | Status: AC

## 2017-06-20 NOTE — Discharge Summary (Addendum)
Discharge Summary    Patient ID: KEYSI OELKERS,  MRN: [id number], DOB/AGE: 03-07-1945 72 y.o.  Admit date: 06/19/2017 Discharge date: 06/20/2017  Primary Care Provider: Mayra Neer Primary Cardiologist: Dr. Gwenlyn Found  Discharge Diagnoses    Active Problems:   Peripheral arterial disease ([place])   Claudication in peripheral vascular disease ([facility])   HTN   Tobacco abuse   Hypokalemia  Allergies Allergies  Allergen Reactions  . Aspirin Nausea Only    Diagnostic Studies/Procedures    Procedures Performed:               1.  Abdominal aortogram/bilateral iliac angiogram/bifemoral runoff               2.  Contralateral access (second order catheter placement)               3.  Chocolate balloon angioplasty followed by drug-eluting balloon angioplasty mid left SFA                 PROCEDURE DESCRIPTION:   The patient was brought to the second floor [facility] lab in the the postabsorptive state. She was premedicated with IV Versed and fentanyl. Her right groin was prepped and shaved in usual sterile fashion. Xylocaine 1% was used for local anesthesia. A 5 French sheath was inserted into the right common femoral artery using standard Seldinger technique.  A 5 French pigtail catheter was placed in the distal abdominal aorta.  Distal abdominal aortography, bilateral iliac angiography with bifemoral runoff was performed using bolus chase, digital subtraction and step table technique.  On the pigtail was used for the entirety of the case.  Retrograde aortic pressure was monitored during the case.  Angiographic Data:   1: Abdominal aortogram- the distal abdominal aorta is free of atherosclerotic changes.  There was some fluoroscopic calcification noted 2: Left lower extremity-95% focal mid left SFA stenosis with one-vessel runoff.  The peroneal terminated in the mid calf.  The posterior tibial terminated at the level of the ankle. 3: Right lower extremity- normal with  three-vessel runoff    IMPRESSION: [name] has a high-grade focal mid left SFA stenosis with one-vessel runoff and pain in her left foot.  We will proceed with PTA and drug-eluting balloon angioplasty of the mid left SFA.  Procedure Description: Contralateral access was obtained with an RIM catheter, Glidewire, Rosen wire and 6 [place] destination sheath.  The patient received 7000 use of heparin with an ACT of 246.  Total contrast administered was 160 cc.  I crossed the lesion with an 014 [place] core wire and performed Cutting Balloon atherectomy with a 4 mm x 4 cm chocolate balloon at nominal pressures.  Following this I performed drug-eluting balloon angioplasty with a 5 mm x 4 cm Admiral balloon at nominal pressures for 2-1/2 minutes resulting in reduction of a 95% mid left SFA stenosis to 0% residual without dissection.  The patient tolerated procedure well.  The sheath was then withdrawn across the bifurcation and exchanged over an 035 wire for a short 6 [place] sheath which was then secured in place.  The patient did receive 300 mg of p.o. Plavix.  Apparently, she is aspirin intolerant.  Final Impression: Successful chocolate balloon angioplasty followed by drug-eluting balloon angioplasty of a high-grade mid left SFA stenosis with an excellent angiographic result, 0% residual with one-vessel runoff via an anterior tibial artery.  The sheath will be removed once the ACT falls below 170 and pressure held.  Patient will be gently hydrated overnight and discharged home in the morning on Plavix alone.  We will get lower extremity arterial Doppler studies in our [facility] line office next week and I will see her back in 2 to 3 weeks thereafter.  She left the lab in stable condition.   History of Present Illness     Leslie [name] is a 72 y.o female with hx of HTN, ongoing tobacco abuse recently evaluated by Dr. Gwenlyn Found for peripheral vascular evaluation because of lifestyle limiting claudication of  the left side for last 3 months with recent Dopplers that were performed 06/02/2017 revealing right ABI of 1.21 and a left 0.96 the high-frequency signal in her distal left SFA .  Hospital Course     Consultants: none  The patient presented for outpatient PV angiogram and underwent s uccessful chocolate balloon angioplasty followed by drug-eluting balloon angioplasty of a high-grade mid left SFA stenosis with an excellent angiographic result, 0% residual with one-vessel runoff via an anterior tibial artery. Hydrated overnight. No complications. Treated with Plavix alone. Allergic to ASA. Will start low dose lipitor. Recheck labs in few weeks.   The patient has been seen by Dr. Irish Lack today and deemed ready for discharge home. All follow-up appointments have been scheduled. Discharge medications are listed below.    Discharge Vitals Blood pressure 111/64, pulse 63, temperature 98.5 F (36.9 C), temperature source Oral, resp. rate (!) 22, weight 135 lb 5.8 oz (61.4 kg), SpO2 97 %, unknown if currently breastfeeding.  Filed Weights   06/20/17 0351  Weight: 135 lb 5.8 oz (61.4 kg)   Physical exam per MD  Labs & Radiologic Studies     CBC Recent Labs    06/20/17 0249  WBC 7.2  HGB 13.2  HCT 39.2  MCV 92.2  PLT 101   Basic Metabolic Panel Recent Labs    06/20/17 0249  NA 139  K 3.2*  CL 107  CO2 27  GLUCOSE 114*  BUN 10  CREATININE 0.81  CALCIUM 8.5*    Disposition   Pt is being discharged home today in good condition.  Follow-up Plans & Appointments    Follow-up Information    [facility]. Go on 06/27/2017.   Specialty:  Cardiology Why:  @2 :30 pm for arterial doppler Contact information: [address] [place] [phone]       Lorretta Harp, MD. Go on 07/08/2017.   Specialties:  Cardiology, Radiology Why:  @10 :15am for post PV procedure Contact information: [address] [place] [address] [phone]          Discharge Instructions    Diet - low sodium heart healthy   Complete by:  As directed    Discharge instructions   Complete by:  As directed    No driving for 48 hours. No lifting over 5 lbs for 1 week. No sexual activity for 1 week.  Keep procedure site clean & dry. If you notice increased pain, swelling, bleeding or pus, call/return!  You may shower, but no soaking baths/hot tubs/pools for 1 week.   Increase activity slowly   Complete by:  As directed       Discharge Medications   Allergies as of 06/20/2017      Reactions   Aspirin Nausea Only      Medication List    TAKE these medications   acetaminophen 500 MG tablet Commonly known as:  TYLENOL Take 500-1,000 mg by  mouth every 6 (six) hours as needed for moderate pain or headache.   atenolol-chlorthalidone 50-25 MG tablet Commonly known as:  TENORETIC Take 0.5 tablets by mouth daily.   atorvastatin 20 MG tablet Commonly known as:  LIPITOR Take 1 tablet (20 mg total) by mouth daily at 6 PM.   cholecalciferol 400 units Tabs tablet Commonly known as:  VITAMIN D Take 400 Units by mouth daily.   clopidogrel 75 MG tablet Commonly known as:  PLAVIX Take 1 tablet (75 mg total) by mouth daily with breakfast.   diclofenac sodium 1 % Gel Commonly known as:  VOLTAREN Apply 2 g topically 4 (four) times daily. Rub into affected area of foot 2 to 4 times daily   Evening Primrose Oil 1000 MG Caps Take 1,000 mg by mouth daily.   fluocinonide cream 0.05 % Commonly known as:  LIDEX Apply 1 application topically 2 (two) times daily as needed (for break outs).   folic acid 970 MCG tablet Commonly known as:  FOLVITE Take 400 mcg by mouth daily.   letrozole 2.5 MG tablet Commonly known as:  FEMARA Take 1 tablet (2.5 mg total) by mouth daily.   levocetirizine 5 MG tablet Commonly known as:  XYZAL Take 5 mg by mouth at bedtime.   MULTIVITAMIN PO Take 1 tablet by mouth daily.     NONFORMULARY OR COMPOUNDED ITEM [facility]:  Antiinflammatory Cream - Diclofenac 3%, Baclofen 2%, Lidocaine 2%, apply 1-2 grams to affected area 3-4 times daily. What changed:    how much to take  how to take this  when to take this  additional instructions   vitamin E 400 UNIT capsule Take 400 Units by mouth daily.       Outstanding Labs/Studies   Arterial doppler in 2 weeks Lipid panel and LFTS in 6 weeks  Duration of Discharge Encounter   Greater than 30 minutes including physician time.  Signed, Crista Luria Bhagat PA-C 06/20/2017, 9:46 AM  I have examined the patient and reviewed assessment and plan and discussed with patient.  Agree with above as stated.   GEN: Well nourished, well developed, in no acute distress  HEENT: normal  Neck: no JVD, carotid bruits, or masses Cardiac: RRR; no murmurs, rubs, or gallops,no edema  Respiratory:  clear to auscultation bilaterally, normal work of breathing GI: soft, nontender, nondistended,  MS: no deformity or atrophy ; femoral pulses bilaterally; DP pulses palpable bilaterally Skin: warm and dry, no rash Neuro:  Strength and sensation are intact Psych: euthymic mood, full affect  DAPT for at least 30 days.  F/u wioth Dr. Gwenlyn Found.  Further imaging studies per Dr. Gwenlyn Found.  Larae Grooms

## 2017-06-25 ENCOUNTER — Other Ambulatory Visit: Payer: Self-pay | Admitting: Cardiovascular Disease

## 2017-06-25 DIAGNOSIS — I739 Peripheral vascular disease, unspecified: Secondary | ICD-10-CM

## 2017-06-27 ENCOUNTER — Ambulatory Visit (HOSPITAL_COMMUNITY)
Admit: 2017-06-27 | Discharge: 2017-06-27 | Disposition: A | Discharge status: Home / Self Care | Payer: Medicare Other | Attending: Cardiology | Admitting: Cardiology

## 2017-06-27 DIAGNOSIS — I70202 Unspecified atherosclerosis of native arteries of extremities, left leg: Secondary | ICD-10-CM | POA: Insufficient documentation

## 2017-06-27 DIAGNOSIS — I739 Peripheral vascular disease, unspecified: Secondary | ICD-10-CM | POA: Diagnosis not present

## 2017-06-27 DIAGNOSIS — F17200 Nicotine dependence, unspecified, uncomplicated: Secondary | ICD-10-CM | POA: Diagnosis not present

## 2017-06-27 DIAGNOSIS — I10 Essential (primary) hypertension: Secondary | ICD-10-CM | POA: Insufficient documentation

## 2017-06-30 ENCOUNTER — Other Ambulatory Visit: Payer: Self-pay | Admitting: *Deleted

## 2017-06-30 DIAGNOSIS — I739 Peripheral vascular disease, unspecified: Secondary | ICD-10-CM

## 2017-07-03 DIAGNOSIS — Z780 Asymptomatic menopausal state: Secondary | ICD-10-CM | POA: Diagnosis not present

## 2017-07-07 ENCOUNTER — Encounter: Payer: Self-pay | Admitting: Podiatry

## 2017-07-07 ENCOUNTER — Ambulatory Visit (INDEPENDENT_AMBULATORY_CARE_PROVIDER_SITE_OTHER): Payer: Medicare Other | Admitting: Podiatry

## 2017-07-07 DIAGNOSIS — I739 Peripheral vascular disease, unspecified: Secondary | ICD-10-CM

## 2017-07-08 ENCOUNTER — Encounter: Payer: Self-pay | Admitting: Cardiovascular Disease

## 2017-07-08 ENCOUNTER — Ambulatory Visit (INDEPENDENT_AMBULATORY_CARE_PROVIDER_SITE_OTHER): Payer: Medicare Other | Admitting: Cardiovascular Disease

## 2017-07-08 VITALS — BP 103/66 | HR 72 | Ht 63.75 in | Wt 131.8 lb

## 2017-07-08 DIAGNOSIS — I739 Peripheral vascular disease, unspecified: Secondary | ICD-10-CM

## 2017-07-08 DIAGNOSIS — E7800 Pure hypercholesterolemia, unspecified: Secondary | ICD-10-CM | POA: Diagnosis not present

## 2017-07-08 DIAGNOSIS — E785 Hyperlipidemia, unspecified: Secondary | ICD-10-CM | POA: Insufficient documentation

## 2017-07-08 NOTE — Progress Notes (Signed)
07/08/2017 Leslie [name]   28-Mar-1945  [id number]  Primary Physician Leslie Neer, MD Primary Cardiologist: Lorretta Harp MD Leslie [name], [place]  HPI:  Leslie [name] is a 72 y.o.  thin appearing widowed African-American female mother of 52, grandmother of one grandchild who is retired from being a Engineer, mining at [employer].  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 Jacqualyn [name] , her podiatrist, for peripheral vascular evaluation because of lifestyle limiting claudication.  I last saw her in the office 06/04/2017. Risk factors include treated hypertension and tobacco abuse is never had a heart attack or stroke.  She denies chest pain or shortness of breath.  She said lifestyle limiting claudication of the left side for last 3 months with recent Dopplers that were performed 06/02/2017 revealing a high-frequency signal in her distal left SFA. Perform peripheral angiography on her 06/19/2017 revealing a 95% mid left SFA stenosis with one-vessel runoff.  I performed chocolate balloon angioplasty followed by drug-eluting balloon angioplasty with an excellent result.  Her follow-up Doppler studies performed 06/27/2017 normalized and her claudication has resolved.    Current Meds  Medication Sig  . acetaminophen (TYLENOL) 500 MG tablet Take 500-1,000 mg by mouth every 6 (six) hours as needed for moderate pain or headache.  Marland Kitchen atenolol-chlorthalidone (TENORETIC) 50-25 MG per tablet Take 0.5 tablets by mouth daily.   Marland Kitchen atorvastatin (LIPITOR) 20 MG tablet Take 1 tablet (20 mg total) by mouth daily at 6 PM.  . cholecalciferol (VITAMIN D) 400 units TABS tablet Take 400 Units by mouth daily.  . clopidogrel (PLAVIX) 75 MG tablet Take 1 tablet (75 mg total) by mouth daily with breakfast.  . Evening Primrose Oil 1000 MG CAPS Take 1,000 mg by mouth daily.  . fluocinonide cream (LIDEX) 4.85 % Apply 1 application topically 2 (two) times daily as needed (for break outs).  . folic acid (FOLVITE) 462 MCG tablet Take  400 mcg by mouth daily.   Marland Kitchen levocetirizine (XYZAL) 5 MG tablet Take 5 mg by mouth at bedtime.   . Multiple Vitamin (MULTIVITAMIN PO) Take 1 tablet by mouth daily.   . vitamin E 400 UNIT capsule Take 400 Units by mouth daily.     Allergies  Allergen Reactions  . Aspirin Nausea Only    Social History   Socioeconomic History  . Marital status: Widowed    Spouse name: Not on file  . Number of children: Not on file  . Years of education: Not on file  . Highest education level: Not on file  Occupational History  . Not on file  Social Needs  . Financial resource strain: Not on file  . Food insecurity:    Worry: Not on file    Inability: Not on file  . Transportation needs:    Medical: Not on file    Non-medical: Not on file  Tobacco Use  . Smoking status: Current Every Day Smoker    Packs/day: 0.50    Years: 45.00    Pack years: 22.50  . Smokeless tobacco: Never Used  Substance and Sexual Activity  . Alcohol use: Yes    Comment: 06/19/2017 "glass of wine 3-4 times/year"  . Drug use: Never  . Sexual activity: Not Currently    Birth control/protection: Post-menopausal, Surgical  Lifestyle  . Physical activity:    Days per week: Not on file    Minutes per session: Not on file  . Stress: Not on file  Relationships  . Social connections:  Talks on phone: Not on file    Gets together: Not on file    Attends religious service: Not on file    Active member of club or organization: Not on file    Attends meetings of clubs or organizations: Not on file    Relationship status: Not on file  . Intimate partner violence:    Fear of current or ex partner: Not on file    Emotionally abused: Not on file    Physically abused: Not on file    Forced sexual activity: Not on file  Other Topics Concern  . Not on file  Social History Narrative  . Not on file     Review of Systems: General: negative for chills, fever, night sweats or weight changes.  Cardiovascular: negative for  chest pain, dyspnea on exertion, edema, orthopnea, palpitations, paroxysmal nocturnal dyspnea or shortness of breath Dermatological: negative for rash Respiratory: negative for cough or wheezing Urologic: negative for hematuria Abdominal: negative for nausea, vomiting, diarrhea, bright red blood per rectum, melena, or hematemesis Neurologic: negative for visual changes, syncope, or dizziness All other systems reviewed and are otherwise negative except as noted above.    Blood pressure 103/66, pulse 72, height 5' 3.75" (1.619 m), weight 131 lb 12.8 oz (59.8 kg), SpO2 100 %, unknown if currently breastfeeding.  General appearance: alert and no distress Neck: no adenopathy, no carotid bruit, no JVD, supple, symmetrical, trachea midline and thyroid not enlarged, symmetric, no tenderness/mass/nodules Lungs: clear to auscultation bilaterally Heart: regular rate and rhythm, S1, S2 normal, no murmur, click, rub or gallop Extremities: extremities normal, atraumatic, no cyanosis or edema Pulses: 2+ and symmetric Skin: Skin color, texture, turgor normal. No rashes or lesions Neurologic: Alert and oriented X 3, normal strength and tone. Normal symmetric reflexes. Normal coordination and gait  EKG not performed today  ASSESSMENT AND PLAN:   Essential hypertension History of essential hypertension blood pressure measured today at 103/66.  She is on atenolol, and chlorthalidone.  Current meds at current dosing.  Tobacco abuse Continued tobacco abuse recalcitrant to risk factor modification.  Hyperlipidemia History of hyperlipidemia on statin therapy  Claudication in peripheral vascular disease ([place]) History of peripheral arterial disease status post left SFA intervention by myself 06/19/2017 using chocolate balloon angioplasty followed by drug-eluting balloon angioplasty.  Her claudication has resolved.  Her follow-up lower extremity arterial Doppler studies performed in our office 06/27/2017 have  normalized.  She does have one-vessel runoff on that side.  We will continue to follow her noninvasively on a semiannual basis.  She remains on dual antiplatelet therapy.      Lorretta Harp MD FACP,FACC,FAHA, [facility] 07/08/2017 10:34 AM

## 2017-07-08 NOTE — Assessment & Plan Note (Signed)
History of hyperlipidemia on statin therapy. 

## 2017-07-08 NOTE — Assessment & Plan Note (Signed)
Continued tobacco abuse recalcitrant to risk factor modification 

## 2017-07-08 NOTE — Patient Instructions (Signed)
Medication Instructions: Your physician recommends that you continue on your current medications as directed. Please refer to the Current Medication list given to you today.   Testing/Procedures:  Every 6 months: Your physician has requested that you have a lower extremity arterial duplex. During this test, ultrasound is used to evaluate arterial blood flow in the legs. Allow one hour for this exam. There are no restrictions or special instructions.  Your physician has requested that you have an ankle brachial index (ABI). During this test an ultrasound and blood pressure cuff are used to evaluate the arteries that supply the arms and legs with blood. Allow thirty minutes for this exam. There are no restrictions or special instructions.  Follow-Up: Your physician wants you to follow-up in: 1 year with Dr. Berry. You will receive a reminder letter in the mail two months in advance. If you don't receive a letter, please call our office to schedule the follow-up appointment.  If you need a refill on your cardiac medications before your next appointment, please call your pharmacy.  

## 2017-07-08 NOTE — Assessment & Plan Note (Signed)
History of essential hypertension blood pressure measured today at 103/66.  She is on atenolol, and chlorthalidone.  Current meds at current dosing.

## 2017-07-08 NOTE — Assessment & Plan Note (Signed)
History of peripheral arterial disease status post left SFA intervention by myself 06/19/2017 using chocolate balloon angioplasty followed by drug-eluting balloon angioplasty.  Her claudication has resolved.  Her follow-up lower extremity arterial Doppler studies performed in our office 06/27/2017 have normalized.  She does have one-vessel runoff on that side.  We will continue to follow her noninvasively on a semiannual basis.  She remains on dual antiplatelet therapy.

## 2017-07-13 NOTE — Progress Notes (Signed)
Subjective: 72 year old female presents the office today for follow-up evaluation pain to her left foot.  She states that since undergoing angioplasty with Dr. Alvester Chou she is no longer having any pain to her feet.  She states that she is doing well and she has had no swelling or pain since the procedure.  She is very happy with the outcome. Denies any systemic complaints such as fevers, chills, nausea, vomiting. No acute changes since last appointment, and no other complaints at this time.   Objective: AAO x3, NAD DP/PT pulses palpable bilaterally, CRT less than 3 seconds At this time there is no area of tenderness identified to bilateral lower extremities there is no edema, erythema, increase in warmth.  There is no open sores identified.  No open lesions or pre-ulcerative lesions.  No pain with calf compression, swelling, warmth, erythema  Assessment: Resolved left foot pain  Plan: -All treatment options discussed with the patient including all alternatives, risks, complications.  -It appears that her pain was all result of circulation.  Since undergoing procedure all of her pain is resolved.  Monitor for any recurrence and to call the office or Dr. Alvester Chou should any issues arise.  She agrees with this and she has no further questions or concerns today. -Patient encouraged to call the office with any questions, concerns, change in symptoms.   Trula Slade DPM

## 2017-10-30 DIAGNOSIS — Z23 Encounter for immunization: Secondary | ICD-10-CM | POA: Diagnosis not present

## 2018-01-27 ENCOUNTER — Ambulatory Visit (HOSPITAL_COMMUNITY)
Admission: RE | Admit: 2018-01-27 | Discharge: 2018-01-27 | Disposition: A | Discharge status: Home / Self Care | Payer: Medicare Other | Source: Ambulatory Visit | Attending: Cardiovascular Disease | Admitting: Cardiovascular Disease

## 2018-01-27 DIAGNOSIS — I739 Peripheral vascular disease, unspecified: Secondary | ICD-10-CM

## 2018-01-30 ENCOUNTER — Other Ambulatory Visit: Payer: Self-pay | Admitting: *Deleted

## 2018-01-30 DIAGNOSIS — I739 Peripheral vascular disease, unspecified: Secondary | ICD-10-CM

## 2018-03-09 ENCOUNTER — Telehealth: Payer: Self-pay | Admitting: Cardiovascular Disease

## 2018-03-09 DIAGNOSIS — Z1231 Encounter for screening mammogram for malignant neoplasm of breast: Secondary | ICD-10-CM | POA: Diagnosis not present

## 2018-03-09 DIAGNOSIS — Z853 Personal history of malignant neoplasm of breast: Secondary | ICD-10-CM | POA: Diagnosis not present

## 2018-03-09 NOTE — Telephone Encounter (Signed)
Received return call from pt. Pt states that her 'stomach pain' started on 2/5 and became unbearable so she stopped taking her clopidogrel on 2/15.  Pt also states that her pain decreased significantly once she stopped taking her clopidogrel. She states that 'a little pain' returned this morning and lasted for about 30 minutes, but she has no current c/o abdominal pain.  Pt states that the pain that began on 2/5 is similar to the pain she had in the past when she took ASA. She states the pain 'comes and goes'. She describes the pain as an aching pain that at times would cause her to feel nauseous. Pt states that the pain usually starts about 3 hours after she takes her clopidogrel and would last for about 1-2 hours, 'ease off a little bit' then return. Pt states pain would intensify in the evening so she would go to bed early because lying down helped to ease the pain.  She states that she has not seen her PCP since she started clopidogrel in 05/2017. Advised pt that her d/c of clopidogrel may cause stent failure so it is recommended that she restart the med. She states that she usually takes clopidogrel daily after a 'light' breakfast. Advised pt that pharmd recommends that she take med with her largest meal of the day to help decrease potential for stomach irritation. Pt states her next meal will be dinner and she has not taken any clopidogrel today. Advised pt to restart her clopidogrel this evening and take along with dinner. Advised her to return call to [facility] office tomorrow for an update on her symptoms after restarting med. Pt agreeable with this. Advised pt to continue to take clopidogrel daily with largest meal of the day. Informed pt that Dr. Gwenlyn Found may recommend another medication if she is unable to tolerate clopidogrel. Pt has not seen a GI doctor. Pt made aware that message will be routed to Dr. Gwenlyn Found for review

## 2018-03-09 NOTE — Telephone Encounter (Signed)
Pt c/o medication issue:  1. Name of Medication: Clopidogrel  2. How are you currently taking this medication (dosage and times per day)? 1 time a day  3. Are you having a reaction (difficulty breathing--STAT)? no  4. What is your medication issue? Making her stomach, pain is unbearable- she stopped taking it yesterday

## 2018-03-09 NOTE — Telephone Encounter (Signed)
LMTCB 2/17

## 2018-03-09 NOTE — Telephone Encounter (Signed)
Noted patient on clopidogrel since 05/2017.  Stomach pain new? Assessment completed by PCP or GI doctor?  Will recommend patient to resume taking clopidogrel with biggest meal of the day to decrease potential for stomach irritation.   Stopping clopidogrel may cause stent to fail.    Cardiologist to recommend another medication if patient unable tot tolerate clopidogrel   (antiplatelets recommendations ALWAYS from cardiologist)

## 2018-03-09 NOTE — Telephone Encounter (Signed)
Primary card is Dr. Gwenlyn Found.

## 2018-03-10 NOTE — Telephone Encounter (Signed)
It has been close to a year since her peripheral intervention.  If she is not tolerating Plavix it would probably be okay to stop it at this time.

## 2018-03-10 NOTE — Addendum Note (Signed)
Addended by: Cristopher Estimable on: 03/10/2018 01:42 PM   Modules accepted: Orders

## 2018-03-10 NOTE — Telephone Encounter (Signed)
Spoke with pt, aware of dr berry's recommendations. 

## 2018-03-18 DIAGNOSIS — R1031 Right lower quadrant pain: Secondary | ICD-10-CM | POA: Diagnosis not present

## 2018-03-18 DIAGNOSIS — Z720 Tobacco use: Secondary | ICD-10-CM | POA: Diagnosis not present

## 2018-03-18 DIAGNOSIS — R109 Unspecified abdominal pain: Secondary | ICD-10-CM | POA: Diagnosis not present

## 2018-05-15 DIAGNOSIS — H02055 Trichiasis without entropian left lower eyelid: Secondary | ICD-10-CM | POA: Diagnosis not present

## 2018-08-06 ENCOUNTER — Other Ambulatory Visit: Payer: Self-pay | Admitting: Family Medicine

## 2018-08-06 DIAGNOSIS — R1031 Right lower quadrant pain: Secondary | ICD-10-CM

## 2018-08-11 DIAGNOSIS — Z961 Presence of intraocular lens: Secondary | ICD-10-CM | POA: Diagnosis not present

## 2018-08-14 ENCOUNTER — Ambulatory Visit
Admission: RE | Admit: 2018-08-14 | Discharge: 2018-08-14 | Disposition: A | Discharge status: Home / Self Care | Payer: Medicare Other | Source: Ambulatory Visit | Attending: Family Medicine | Admitting: Family Medicine

## 2018-08-14 DIAGNOSIS — R1031 Right lower quadrant pain: Secondary | ICD-10-CM | POA: Diagnosis not present

## 2018-08-14 MED ORDER — IOPAMIDOL (ISOVUE-300) INJECTION 61%
100.0000 mL | Freq: Once | INTRAVENOUS | Status: AC | PRN
  Administered 2018-08-14: 100 mL via INTRAVENOUS

## 2018-09-09 DIAGNOSIS — Z1211 Encounter for screening for malignant neoplasm of colon: Secondary | ICD-10-CM | POA: Diagnosis not present

## 2018-09-09 DIAGNOSIS — I10 Essential (primary) hypertension: Secondary | ICD-10-CM | POA: Diagnosis not present

## 2018-09-09 DIAGNOSIS — R1030 Lower abdominal pain, unspecified: Secondary | ICD-10-CM | POA: Diagnosis not present

## 2018-09-09 DIAGNOSIS — K5900 Constipation, unspecified: Secondary | ICD-10-CM | POA: Diagnosis not present

## 2018-09-09 DIAGNOSIS — Z853 Personal history of malignant neoplasm of breast: Secondary | ICD-10-CM | POA: Diagnosis not present

## 2018-09-15 ENCOUNTER — Telehealth: Payer: Self-pay

## 2018-09-15 NOTE — Telephone Encounter (Signed)
    COVID-19 Pre-Screening Questions:  . In the past 7 to 10 days have you had a cough,  shortness of breath, headache, congestion, fever (100 or greater) body aches, chills, sore throat, or sudden loss of taste or sense of smell? NO . Have you been around anyone with known Covid 19? NO . Have you been around anyone who is awaiting Covid 19 test results in the past 7 to 10 days? NO . Have you been around anyone who has been exposed to Covid 19, or has mentioned symptoms of Covid 19 within the past 7 to 10 days? NO  If you have any concerns/questions about symptoms patients report during screening (either on the phone or at threshold). Contact the provider seeing the patient or DOD for further guidance.  If neither are available contact a member of the leadership team.    Spoke with pt who is okay with appt reschedule to 09/30/2018 at 2:00pm. Informed her of the mask policy and guest policy. Pt verbalized understanding

## 2018-09-16 ENCOUNTER — Ambulatory Visit: Payer: Medicare Other | Admitting: Cardiovascular Disease

## 2018-09-25 DIAGNOSIS — Z23 Encounter for immunization: Secondary | ICD-10-CM | POA: Diagnosis not present

## 2018-09-30 ENCOUNTER — Encounter: Payer: Self-pay | Admitting: Cardiovascular Disease

## 2018-09-30 ENCOUNTER — Other Ambulatory Visit: Payer: Self-pay

## 2018-09-30 ENCOUNTER — Ambulatory Visit (INDEPENDENT_AMBULATORY_CARE_PROVIDER_SITE_OTHER): Payer: Medicare Other | Admitting: Cardiovascular Disease

## 2018-09-30 DIAGNOSIS — I739 Peripheral vascular disease, unspecified: Secondary | ICD-10-CM | POA: Diagnosis not present

## 2018-09-30 DIAGNOSIS — E782 Mixed hyperlipidemia: Secondary | ICD-10-CM | POA: Diagnosis not present

## 2018-09-30 DIAGNOSIS — I10 Essential (primary) hypertension: Secondary | ICD-10-CM | POA: Diagnosis not present

## 2018-09-30 DIAGNOSIS — Z720 Tobacco use: Secondary | ICD-10-CM | POA: Diagnosis not present

## 2018-09-30 NOTE — Assessment & Plan Note (Signed)
Ongoing tobacco abuse of 1/2 pack/day recalcitrant to risk factor modification. 

## 2018-09-30 NOTE — Progress Notes (Signed)
09/30/2018 [name]   21-Sep-1945  [id number]  Primary Physician Mayra Neer, MD Primary Cardiologist: Lorretta Harp MD [name], [place]  HPI:  Leslie [name] is a 73 y.o.  thin appearing widowed African-American female mother of 61, grandmother of one grandchild who is retired from being a Engineer, mining at [employer]. 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 Jacqualyn Posey, her podiatrist, for peripheral vascular evaluation because of lifestyle limiting claudication.  I last saw her in the office 07/08/2017.Risk factors include treated hypertension and tobacco abuse is never had a heart attack or stroke. She denies chest pain or shortness of breath. She said lifestyle limiting claudication of the left side for last 3 months with recent Dopplers that were performed 06/02/2017 revealing a high-frequency signal in her distal left SFA.  I performed peripheral angiography on her 06/19/2017 revealing a 95% mid left SFA stenosis with one-vessel runoff.  I performed chocolate balloon angioplasty followed by drug-eluting balloon angioplasty with an excellent result.  Her follow-up Doppler studies performed 06/27/2017 normalized and her claudication has resolved.  Since I saw her a year ago she is done well.  She does continue to smoke 1/2 pack/day.  She is not taking her statin drug.  Follow-up Dopplers performed 01/27/2018 revealed normal ABIs bilaterally with a widely patent left SFA.  She denies chest pain, shortness of breath or claudication.    Current Meds  Medication Sig  . acetaminophen (TYLENOL) 500 MG tablet Take 500-1,000 mg by mouth every 6 (six) hours as needed for moderate pain or headache.  Marland Kitchen atenolol-chlorthalidone (TENORETIC) 50-25 MG per tablet Take 0.5 tablets by mouth daily.   . cholecalciferol (VITAMIN D) 400 units TABS tablet Take 400 Units by mouth daily.  . Evening Primrose Oil 1000 MG CAPS Take 1,000 mg by mouth daily.  . fluocinonide cream (LIDEX) [id number] % Apply 1  application topically 2 (two) times daily as needed (for break outs).  . folic acid (FOLVITE) A999333 MCG tablet Take 400 mcg by mouth daily.   Marland Kitchen levocetirizine (XYZAL) 5 MG tablet Take 5 mg by mouth at bedtime.   . Multiple Vitamin (MULTIVITAMIN PO) Take 1 tablet by mouth daily.   . vitamin E 400 UNIT capsule Take 400 Units by mouth daily.     Allergies  Allergen Reactions  . Aspirin Nausea Only    Social History   Socioeconomic History  . Marital status: Widowed    Spouse name: Not on file  . Number of children: Not on file  . Years of education: Not on file  . Highest education level: Not on file  Occupational History  . Not on file  Social Needs  . Financial resource strain: Not on file  . Food insecurity    Worry: Not on file    Inability: Not on file  . Transportation needs    Medical: Not on file    Non-medical: Not on file  Tobacco Use  . Smoking status: Current Every Day Smoker    Packs/day: 0.50    Years: 45.00    Pack years: 22.50  . Smokeless tobacco: Never Used  Substance and Sexual Activity  . Alcohol use: Yes    Comment: 06/19/2017 "glass of wine 3-4 times/year"  . Drug use: Never  . Sexual activity: Not Currently    Birth control/protection: Post-menopausal, Surgical  Lifestyle  . Physical activity    Days per week: Not on file    Minutes per session: Not on file  .  Stress: Not on file  Relationships  . Social Herbalist on phone: Not on file    Gets together: Not on file    Attends religious service: Not on file    Active member of club or organization: Not on file    Attends meetings of clubs or organizations: Not on file    Relationship status: Not on file  . Intimate partner violence    Fear of current or ex partner: Not on file    Emotionally abused: Not on file    Physically abused: Not on file    Forced sexual activity: Not on file  Other Topics Concern  . Not on file  Social History Narrative  . Not on file     Review of  Systems: General: negative for chills, fever, night sweats or weight changes.  Cardiovascular: negative for chest pain, dyspnea on exertion, edema, orthopnea, palpitations, paroxysmal nocturnal dyspnea or shortness of breath Dermatological: negative for rash Respiratory: negative for cough or wheezing Urologic: negative for hematuria Abdominal: negative for nausea, vomiting, diarrhea, bright red blood per rectum, melena, or hematemesis Neurologic: negative for visual changes, syncope, or dizziness All other systems reviewed and are otherwise negative except as noted above.    Blood pressure 124/66, pulse 63, height 5' 3.75" (1.619 m), weight 127 lb 3.2 oz (57.7 kg), unknown if currently breastfeeding.  General appearance: alert and no distress Neck: no adenopathy, no carotid bruit, no JVD, supple, symmetrical, trachea midline and thyroid not enlarged, symmetric, no tenderness/mass/nodules Lungs: clear to auscultation bilaterally Heart: regular rate and rhythm, S1, S2 normal, no murmur, click, rub or gallop Extremities: extremities normal, atraumatic, no cyanosis or edema Pulses: 2+ and symmetric Skin: Skin color, texture, turgor normal. No rashes or lesions Neurologic: Alert and oriented X 3, normal strength and tone. Normal symmetric reflexes. Normal coordination and gait  EKG sinus rhythm at 63 with nonspecific ST and T wave changes.  I personally reviewed this EKG.  ASSESSMENT AND PLAN:   Essential hypertension History of essential hypertension with blood pressure measured today at 124/66.  She is on atenolol and chlorthalidone.  Peripheral arterial disease (HCC) History of PAD status post chocolate balloon atherectomy followed by drug-coated balloon angioplasty of a 95% mid left SFA stenosis 06/19/2017 with follow-up Dopplers performed 01/27/2018 revealing normal ABIs with a widely patent left SFA.  Tobacco abuse Ongoing tobacco abuse of 1/2 pack/day recalcitrant to risk factor  modification.  Hyperlipidemia History of hyperlipidemia supposedly on statin therapy in the past although she says she has not taken it with lipid profile performed 03/31/2017 revealing total cholesterol 175, LDL 103 and HDL 58.  We will recheck a lipid liver profile.      Lorretta Harp MD FACP,FACC,FAHA, [facility] 09/30/2018 2:16 PM

## 2018-09-30 NOTE — Assessment & Plan Note (Signed)
History of hyperlipidemia supposedly on statin therapy in the past although she says she has not taken it with lipid profile performed 03/31/2017 revealing total cholesterol 175, LDL 103 and HDL 58.  We will recheck a lipid liver profile.

## 2018-09-30 NOTE — Assessment & Plan Note (Signed)
History of essential hypertension with blood pressure measured today at 124/66.  She is on atenolol and chlorthalidone.

## 2018-09-30 NOTE — Patient Instructions (Signed)
Medication Instructions:  Your physician recommends that you continue on your current medications as directed. Please refer to the Current Medication list given to you today.  If you need a refill on your cardiac medications before your next appointment, please call your pharmacy.   Lab work: Your physician recommends that you return for lab work WITHIN 1 WEEK: FASTING LIPID AND LIVER PANELS  If you have labs (blood work) drawn today and your tests are completely normal, you will receive your results only by: Marland Kitchen MyChart Message (if you have MyChart) OR . A paper copy in the mail If you have any lab test that is abnormal or we need to change your treatment, we will call you to review the results.  Testing/Procedures: Your physician has requested that you have a lower or upper extremity arterial duplex. This test is an ultrasound of the arteries in the legs or arms. It looks at arterial blood flow in the legs and arms. Allow one hour for Lower and Upper Arterial scans. There are no restrictions or special instructions DUE January 2021  Your physician has requested that you have an ankle brachial index (ABI). During this test an ultrasound and blood pressure cuff are used to evaluate the arteries that supply the arms and legs with blood. Allow thirty minutes for this exam. There are no restrictions or special instructions. DUE January 2021   Follow-Up: At [facility], you and your health needs are our priority.  As part of our continuing mission to provide you with exceptional heart care, we have created designated Provider Care Teams.  These Care Teams include your primary Cardiologist (physician) and Advanced Practice Providers (APPs -  Physician Assistants and Nurse Practitioners) who all work together to provide you with the care you need, when you need it. You will need a follow up appointment in 12 months with Dr. Quay Burow.  Please call our office 2 months in advance to schedule  this appointment.

## 2018-09-30 NOTE — Assessment & Plan Note (Signed)
History of PAD status post chocolate balloon atherectomy followed by drug-coated balloon angioplasty of a 95% mid left SFA stenosis 06/19/2017 with follow-up Dopplers performed 01/27/2018 revealing normal ABIs with a widely patent left SFA.

## 2018-10-06 DIAGNOSIS — E782 Mixed hyperlipidemia: Secondary | ICD-10-CM | POA: Diagnosis not present

## 2018-10-06 LAB — HEPATIC FUNCTION PANEL
ALT: 16 IU/L (ref 0–32)
AST: 17 IU/L (ref 0–40)
Albumin: 4.4 g/dL (ref 3.7–4.7)
Alkaline Phosphatase: 66 IU/L (ref 39–117)
Bilirubin Total: 0.5 mg/dL (ref 0.0–1.2)
Bilirubin, Direct: 0.13 mg/dL (ref 0.00–0.40)
Total Protein: 7.2 g/dL (ref 6.0–8.5)

## 2018-10-06 LAB — LIPID PANEL
Chol/HDL Ratio: 3.4 ratio (ref 0.0–4.4)
Cholesterol, Total: 195 mg/dL (ref 100–199)
HDL: 57 mg/dL (ref >39)
LDL Chol Calc (NIH): 116 mg/dL — ABNORMAL HIGH (ref 0–99)
Triglycerides: 124 mg/dL (ref 0–149)
VLDL Cholesterol Cal: 22 mg/dL (ref 5–40)

## 2018-10-09 ENCOUNTER — Other Ambulatory Visit: Payer: Self-pay

## 2018-10-09 DIAGNOSIS — E782 Mixed hyperlipidemia: Secondary | ICD-10-CM

## 2018-10-09 MED ORDER — ATORVASTATIN CALCIUM 40 MG PO TABS: 40.0000 mg | ORAL_TABLET | Freq: Every day | ORAL | 3 refills | Status: DC

## 2018-10-13 DIAGNOSIS — E7800 Pure hypercholesterolemia, unspecified: Secondary | ICD-10-CM | POA: Diagnosis not present

## 2018-10-13 DIAGNOSIS — R7301 Impaired fasting glucose: Secondary | ICD-10-CM | POA: Diagnosis not present

## 2018-10-13 DIAGNOSIS — I10 Essential (primary) hypertension: Secondary | ICD-10-CM | POA: Diagnosis not present

## 2018-10-15 DIAGNOSIS — R7301 Impaired fasting glucose: Secondary | ICD-10-CM | POA: Diagnosis not present

## 2018-10-15 DIAGNOSIS — Z853 Personal history of malignant neoplasm of breast: Secondary | ICD-10-CM | POA: Diagnosis not present

## 2018-10-15 DIAGNOSIS — J301 Allergic rhinitis due to pollen: Secondary | ICD-10-CM | POA: Diagnosis not present

## 2018-10-15 DIAGNOSIS — Z720 Tobacco use: Secondary | ICD-10-CM | POA: Diagnosis not present

## 2018-10-15 DIAGNOSIS — Z1211 Encounter for screening for malignant neoplasm of colon: Secondary | ICD-10-CM | POA: Diagnosis not present

## 2018-10-15 DIAGNOSIS — Z7189 Other specified counseling: Secondary | ICD-10-CM | POA: Diagnosis not present

## 2018-10-15 DIAGNOSIS — Z0000 Encounter for general adult medical examination without abnormal findings: Secondary | ICD-10-CM | POA: Diagnosis not present

## 2018-10-15 DIAGNOSIS — I10 Essential (primary) hypertension: Secondary | ICD-10-CM | POA: Diagnosis not present

## 2018-10-22 DIAGNOSIS — Z1159 Encounter for screening for other viral diseases: Secondary | ICD-10-CM | POA: Diagnosis not present

## 2018-10-27 DIAGNOSIS — K635 Polyp of colon: Secondary | ICD-10-CM | POA: Diagnosis not present

## 2018-10-27 DIAGNOSIS — Z1211 Encounter for screening for malignant neoplasm of colon: Secondary | ICD-10-CM | POA: Diagnosis not present

## 2018-10-30 DIAGNOSIS — K635 Polyp of colon: Secondary | ICD-10-CM | POA: Diagnosis not present

## 2018-12-30 DIAGNOSIS — L309 Dermatitis, unspecified: Secondary | ICD-10-CM | POA: Diagnosis not present

## 2018-12-30 DIAGNOSIS — L819 Disorder of pigmentation, unspecified: Secondary | ICD-10-CM | POA: Diagnosis not present

## 2019-01-04 DIAGNOSIS — D485 Neoplasm of uncertain behavior of skin: Secondary | ICD-10-CM | POA: Diagnosis not present

## 2019-01-12 ENCOUNTER — Other Ambulatory Visit: Payer: Self-pay | Admitting: Cardiovascular Disease

## 2019-01-12 DIAGNOSIS — I739 Peripheral vascular disease, unspecified: Secondary | ICD-10-CM

## 2019-01-26 ENCOUNTER — Other Ambulatory Visit: Payer: Self-pay

## 2019-01-26 ENCOUNTER — Ambulatory Visit (HOSPITAL_COMMUNITY)
Admission: RE | Admit: 2019-01-26 | Discharge: 2019-01-26 | Disposition: A | Discharge status: Home / Self Care | Payer: Medicare Other | Source: Ambulatory Visit | Attending: Cardiology | Admitting: Cardiology

## 2019-01-26 ENCOUNTER — Encounter (HOSPITAL_COMMUNITY): Payer: Medicare Other

## 2019-01-26 ENCOUNTER — Other Ambulatory Visit: Payer: Self-pay | Admitting: Cardiovascular Disease

## 2019-01-26 DIAGNOSIS — I739 Peripheral vascular disease, unspecified: Secondary | ICD-10-CM

## 2019-01-28 ENCOUNTER — Other Ambulatory Visit: Payer: Self-pay | Admitting: *Deleted

## 2019-01-28 DIAGNOSIS — I739 Peripheral vascular disease, unspecified: Secondary | ICD-10-CM

## 2019-02-08 ENCOUNTER — Ambulatory Visit: Payer: Medicare Other | Attending: Internal Medicine

## 2019-02-08 ENCOUNTER — Other Ambulatory Visit: Payer: Self-pay

## 2019-02-08 DIAGNOSIS — Z20822 Contact with and (suspected) exposure to covid-19: Secondary | ICD-10-CM

## 2019-02-09 LAB — NOVEL CORONAVIRUS, NAA: SARS-CoV-2, NAA: NOT DETECTED

## 2019-02-11 DIAGNOSIS — L728 Other follicular cysts of the skin and subcutaneous tissue: Secondary | ICD-10-CM | POA: Diagnosis not present

## 2019-02-11 DIAGNOSIS — L905 Scar conditions and fibrosis of skin: Secondary | ICD-10-CM | POA: Diagnosis not present

## 2019-03-15 DIAGNOSIS — Z1231 Encounter for screening mammogram for malignant neoplasm of breast: Secondary | ICD-10-CM | POA: Diagnosis not present

## 2019-05-07 DIAGNOSIS — L309 Dermatitis, unspecified: Secondary | ICD-10-CM | POA: Diagnosis not present

## 2019-05-18 DIAGNOSIS — L309 Dermatitis, unspecified: Secondary | ICD-10-CM | POA: Diagnosis not present

## 2019-05-28 DIAGNOSIS — L309 Dermatitis, unspecified: Secondary | ICD-10-CM | POA: Diagnosis not present

## 2019-06-01 DIAGNOSIS — L309 Dermatitis, unspecified: Secondary | ICD-10-CM | POA: Diagnosis not present

## 2019-08-03 DIAGNOSIS — R079 Chest pain, unspecified: Secondary | ICD-10-CM | POA: Diagnosis not present

## 2019-08-09 ENCOUNTER — Other Ambulatory Visit: Payer: Self-pay | Admitting: Family Medicine

## 2019-08-09 DIAGNOSIS — R109 Unspecified abdominal pain: Secondary | ICD-10-CM

## 2019-08-10 ENCOUNTER — Other Ambulatory Visit: Payer: Self-pay | Admitting: Family Medicine

## 2019-08-10 ENCOUNTER — Ambulatory Visit
Admission: RE | Admit: 2019-08-10 | Discharge: 2019-08-10 | Disposition: A | Discharge status: Home / Self Care | Payer: Medicare Other | Source: Ambulatory Visit | Attending: Family Medicine | Admitting: Family Medicine

## 2019-08-10 DIAGNOSIS — M545 Low back pain, unspecified: Secondary | ICD-10-CM

## 2019-08-10 DIAGNOSIS — M5136 Other intervertebral disc degeneration, lumbar region: Secondary | ICD-10-CM | POA: Diagnosis not present

## 2019-08-10 DIAGNOSIS — M4186 Other forms of scoliosis, lumbar region: Secondary | ICD-10-CM | POA: Diagnosis not present

## 2019-08-10 DIAGNOSIS — R109 Unspecified abdominal pain: Secondary | ICD-10-CM

## 2019-08-10 DIAGNOSIS — M5135 Other intervertebral disc degeneration, thoracolumbar region: Secondary | ICD-10-CM | POA: Diagnosis not present

## 2019-08-10 DIAGNOSIS — R1013 Epigastric pain: Secondary | ICD-10-CM | POA: Diagnosis not present

## 2019-08-10 DIAGNOSIS — I700 Atherosclerosis of aorta: Secondary | ICD-10-CM | POA: Diagnosis not present

## 2019-09-03 DIAGNOSIS — M545 Low back pain: Secondary | ICD-10-CM | POA: Diagnosis not present

## 2019-09-06 ENCOUNTER — Other Ambulatory Visit: Payer: Self-pay | Admitting: Orthopedic Surgery

## 2019-09-06 DIAGNOSIS — H5212 Myopia, left eye: Secondary | ICD-10-CM | POA: Diagnosis not present

## 2019-09-06 DIAGNOSIS — Z961 Presence of intraocular lens: Secondary | ICD-10-CM | POA: Diagnosis not present

## 2019-09-06 DIAGNOSIS — M545 Low back pain, unspecified: Secondary | ICD-10-CM

## 2019-09-22 ENCOUNTER — Other Ambulatory Visit: Payer: Medicare Other

## 2019-10-04 ENCOUNTER — Ambulatory Visit
Admission: RE | Admit: 2019-10-04 | Discharge: 2019-10-04 | Disposition: A | Discharge status: Home / Self Care | Payer: Medicare Other | Source: Ambulatory Visit | Attending: Orthopedic Surgery | Admitting: Orthopedic Surgery

## 2019-10-04 ENCOUNTER — Other Ambulatory Visit: Payer: Self-pay

## 2019-10-04 DIAGNOSIS — M545 Low back pain, unspecified: Secondary | ICD-10-CM

## 2019-10-04 DIAGNOSIS — M48061 Spinal stenosis, lumbar region without neurogenic claudication: Secondary | ICD-10-CM | POA: Diagnosis not present

## 2019-10-12 DIAGNOSIS — M5416 Radiculopathy, lumbar region: Secondary | ICD-10-CM | POA: Diagnosis not present

## 2019-10-13 ENCOUNTER — Other Ambulatory Visit: Payer: Self-pay | Admitting: Orthopedic Surgery

## 2019-10-13 DIAGNOSIS — M545 Low back pain, unspecified: Secondary | ICD-10-CM

## 2019-10-15 ENCOUNTER — Other Ambulatory Visit: Payer: Self-pay

## 2019-10-15 ENCOUNTER — Ambulatory Visit
Admission: RE | Admit: 2019-10-15 | Discharge: 2019-10-15 | Disposition: A | Discharge status: Home / Self Care | Payer: Medicare Other | Source: Ambulatory Visit | Attending: Orthopedic Surgery | Admitting: Orthopedic Surgery

## 2019-10-15 DIAGNOSIS — M5416 Radiculopathy, lumbar region: Secondary | ICD-10-CM | POA: Diagnosis not present

## 2019-10-15 DIAGNOSIS — M545 Low back pain, unspecified: Secondary | ICD-10-CM

## 2019-10-15 MED ORDER — METHYLPREDNISOLONE ACETATE 40 MG/ML INJ SUSP (RADIOLOG
120.0000 mg | Freq: Once | INTRAMUSCULAR | Status: AC
  Administered 2019-10-15: 120 mg via EPIDURAL

## 2019-10-15 MED ORDER — IOPAMIDOL (ISOVUE-M 200) INJECTION 41%
1.0000 mL | Freq: Once | INTRAMUSCULAR | Status: AC
  Administered 2019-10-15: 1 mL via EPIDURAL

## 2019-10-15 NOTE — Discharge Instructions (Signed)

## 2019-10-21 DIAGNOSIS — M5416 Radiculopathy, lumbar region: Secondary | ICD-10-CM | POA: Diagnosis not present

## 2019-10-26 ENCOUNTER — Other Ambulatory Visit: Payer: Self-pay

## 2019-10-26 ENCOUNTER — Ambulatory Visit (INDEPENDENT_AMBULATORY_CARE_PROVIDER_SITE_OTHER): Payer: Medicare Other | Admitting: Cardiovascular Disease

## 2019-10-26 ENCOUNTER — Encounter: Payer: Self-pay | Admitting: Cardiovascular Disease

## 2019-10-26 VITALS — BP 122/66 | Ht 64.0 in | Wt 116.0 lb

## 2019-10-26 DIAGNOSIS — E782 Mixed hyperlipidemia: Secondary | ICD-10-CM

## 2019-10-26 DIAGNOSIS — I10 Essential (primary) hypertension: Secondary | ICD-10-CM

## 2019-10-26 DIAGNOSIS — Z720 Tobacco use: Secondary | ICD-10-CM

## 2019-10-26 DIAGNOSIS — I739 Peripheral vascular disease, unspecified: Secondary | ICD-10-CM

## 2019-10-26 NOTE — Assessment & Plan Note (Signed)
History of hyperlipidemia not on statin therapy with lipid profile performed 10/13/2018 revealing total cholesterol 186, LDL of 113 and HDL 51.  We will recheck a fasting lipid and liver profile.

## 2019-10-26 NOTE — Patient Instructions (Signed)
  Lab Work:  Your physician recommends that you return for lab work in: [place]  If you have labs (blood work) drawn today and your tests are completely normal, you will receive your results only by: Marland Kitchen MyChart Message (if you have MyChart) OR . A paper copy in the mail If you have any lab test that is abnormal or we need to change your treatment, we will call you to review the results.   Testing/Procedures:  Your physician has requested that you have a lower or upper extremity arterial duplex. This test is an ultrasound of the arteries in the legs or arms. It looks at arterial blood flow in the legs and arms. Allow one hour for Lower and Upper Arterial scans. There are no restrictions or special instructions SCHEDULE IN [place]   Follow-Up: At [facility], you and your health needs are our priority.  As part of our continuing mission to provide you with exceptional heart care, we have created designated Provider Care Teams.  These Care Teams include your primary Cardiologist (physician) and Advanced Practice Providers (APPs -  Physician Assistants and Nurse Practitioners) who all work together to provide you with the care you need, when you need it.  We recommend signing up for the patient portal called "MyChart".  Sign up information is provided on this After Visit Summary.  MyChart is used to connect with patients for Virtual Visits (Telemedicine).  Patients are able to view lab/test results, encounter notes, upcoming appointments, etc.  Non-urgent messages can be sent to your provider as well.   To learn more about what you can do with MyChart, go to [email or web address].    Your next appointment:   12 month(s)  The format for your next appointment:   In Person  Provider:   You may see Quay Burow MD or one of the following Advanced Practice Providers on your designated Care Team:    Kerin Ransom, PA-C  [place], [place]  Coletta Memos, [place]

## 2019-10-26 NOTE — Assessment & Plan Note (Signed)
Ongoing tobacco abuse of 1/2 pack/day recalcitrant to receptor modification.

## 2019-10-26 NOTE — Progress Notes (Signed)
10/26/2019 [name]   12-May-1945  [id number]  Primary Physician Mayra Neer, MD Primary Cardiologist: Lorretta Harp MD [name], [place]  HPI:  Leslie [name] is a 74 y.o.  thin appearing widowed African-American female mother of 56, grandmother of one grandchild who is retired from being a Engineer, mining at [employer]. 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 Jacqualyn Posey, her podiatrist, for peripheral vascular evaluation because of lifestyle limiting claudication.I last saw her in the office  09/30/2018.Risk factors include treated hypertension and tobacco abuse is never had a heart attack or stroke. She denies chest pain or shortness of breath. She said lifestyle limiting claudication of the left side for last 3 months with recent Dopplers that were performed 06/02/2017 revealing a high-frequency signal in her distal left SFA.  I performed peripheral angiography on her 06/19/2017 revealing a 95% mid left SFA stenosis with one-vessel runoff. I performed chocolate balloon angioplasty followed by drug-eluting balloon angioplasty with an excellent result. Her follow-up Doppler studies performed 06/27/2017 normalized and her claudication has resolved.  Since I saw her a year ago she is done well.  She does continue to smoke 1/2 pack/day.  She is not taking her statin drug.  Follow-up Dopplers performed 01/26/2019 revealed normal ABIs bilaterally with a widely patent left SFA.  She denies chest pain, shortness of breath or claudication.   Current Meds  Medication Sig  . acetaminophen (TYLENOL) 500 MG tablet Take 500-1,000 mg by mouth every 6 (six) hours as needed for moderate pain or headache.  Marland Kitchen atenolol-chlorthalidone (TENORETIC) 50-25 MG per tablet Take 0.5 tablets by mouth daily.   . cholecalciferol (VITAMIN D) 400 units TABS tablet Take 400 Units by mouth daily.  . Evening Primrose Oil 1000 MG CAPS Take 1,000 mg by mouth daily.  . fluocinonide cream (LIDEX) 2.77 % Apply 1  application topically 2 (two) times daily as needed (for break outs).  . folic acid (FOLVITE) 412 MCG tablet Take 400 mcg by mouth daily.   Marland Kitchen gabapentin (NEURONTIN) 300 MG capsule   . levocetirizine (XYZAL) 5 MG tablet Take 5 mg by mouth at bedtime.   . Multiple Vitamin (MULTIVITAMIN PO) Take 1 tablet by mouth daily.   . vitamin E 400 UNIT capsule Take 400 Units by mouth daily.     Allergies  Allergen Reactions  . Aspirin Nausea Only    Social History   Socioeconomic History  . Marital status: Widowed    Spouse name: Not on file  . Number of children: Not on file  . Years of education: Not on file  . Highest education level: Not on file  Occupational History  . Not on file  Tobacco Use  . Smoking status: Current Every Day Smoker    Packs/day: 0.50    Years: 45.00    Pack years: 22.50  . Smokeless tobacco: Never Used  Vaping Use  . Vaping Use: Never used  Substance and Sexual Activity  . Alcohol use: Yes    Comment: 06/19/2017 "glass of wine 3-4 times/year"  . Drug use: Never  . Sexual activity: Not Currently    Birth control/protection: Post-menopausal, Surgical  Other Topics Concern  . Not on file  Social History Narrative  . Not on file   Social Determinants of Health   Financial Resource Strain:   . Difficulty of Paying Living Expenses: Not on file  Food Insecurity:   . Worried About Charity fundraiser in the Last Year: Not on  file  . [place] in the Last Year: Not on file  Transportation Needs:   . Lack of Transportation (Medical): Not on file  . Lack of Transportation (Non-Medical): Not on file  Physical Activity:   . Days of Exercise per Week: Not on file  . Minutes of Exercise per Session: Not on file  Stress:   . Feeling of Stress : Not on file  Social Connections:   . Frequency of Communication with Friends and Family: Not on file  . Frequency of Social Gatherings with Friends and Family: Not on file  . Attends Religious Services: Not on  file  . Active Member of Clubs or Organizations: Not on file  . Attends Archivist Meetings: Not on file  . Marital Status: Not on file  Intimate Partner Violence:   . Fear of Current or Ex-Partner: Not on file  . Emotionally Abused: Not on file  . Physically Abused: Not on file  . Sexually Abused: Not on file     Review of Systems: General: negative for chills, fever, night sweats or weight changes.  Cardiovascular: negative for chest pain, dyspnea on exertion, edema, orthopnea, palpitations, paroxysmal nocturnal dyspnea or shortness of breath Dermatological: negative for rash Respiratory: negative for cough or wheezing Urologic: negative for hematuria Abdominal: negative for nausea, vomiting, diarrhea, bright red blood per rectum, melena, or hematemesis Neurologic: negative for visual changes, syncope, or dizziness All other systems reviewed and are otherwise negative except as noted above.    Blood pressure 122/66, height 5\' 4"  (1.626 m), weight 116 lb (52.6 kg), unknown if currently breastfeeding.  General appearance: alert and no distress Neck: no adenopathy, no carotid bruit, no JVD, supple, symmetrical, trachea midline and thyroid not enlarged, symmetric, no tenderness/mass/nodules Lungs: clear to auscultation bilaterally Heart: regular rate and rhythm, S1, S2 normal, no murmur, click, rub or gallop Extremities: extremities normal, atraumatic, no cyanosis or edema Pulses: 2+ and symmetric Skin: Skin color, texture, turgor normal. No rashes or lesions Neurologic: Alert and oriented X 3, normal strength and tone. Normal symmetric reflexes. Normal coordination and gait  EKG sinus rhythm at 74 with nonspecific ST and T wave changes.  I personally reviewed this EKG.  ASSESSMENT AND PLAN:   Essential hypertension History of essential hypertension a blood pressure measured today 122/66.  She is on atenolol and chlorthalidone.  Peripheral arterial disease  (HCC) History of PAD status post left SFA intervention by myself 06/19/2017 with chocolate balloon angioplasty followed by [facility] resulting in normalization of her Doppler studies and resolution of her claudication symptoms.  Her most recent Doppler studies performed 01/26/2019 revealed normal ABIs bilaterally with a widely patent left SFA.  Tobacco abuse Ongoing tobacco abuse of 1/2 pack/day recalcitrant to receptor modification.  Hyperlipidemia History of hyperlipidemia not on statin therapy with lipid profile performed 10/13/2018 revealing total cholesterol 186, LDL of 113 and HDL 51.  We will recheck a fasting lipid and liver profile.      Lorretta Harp MD FACP,FACC,FAHA, [facility] 10/26/2019 4:38 PM

## 2019-10-26 NOTE — Assessment & Plan Note (Signed)
History of PAD status post left SFA intervention by myself 06/19/2017 with chocolate balloon angioplasty followed by [facility] resulting in normalization of her Doppler studies and resolution of her claudication symptoms.  Her most recent Doppler studies performed 01/26/2019 revealed normal ABIs bilaterally with a widely patent left SFA.

## 2019-10-26 NOTE — Progress Notes (Signed)
kg

## 2019-10-26 NOTE — Assessment & Plan Note (Signed)
History of essential hypertension a blood pressure measured today 122/66.  She is on atenolol and chlorthalidone.

## 2019-10-27 DIAGNOSIS — Z853 Personal history of malignant neoplasm of breast: Secondary | ICD-10-CM | POA: Diagnosis not present

## 2019-10-27 DIAGNOSIS — Z720 Tobacco use: Secondary | ICD-10-CM | POA: Diagnosis not present

## 2019-10-27 DIAGNOSIS — M5430 Sciatica, unspecified side: Secondary | ICD-10-CM | POA: Diagnosis not present

## 2019-10-27 DIAGNOSIS — Z23 Encounter for immunization: Secondary | ICD-10-CM | POA: Diagnosis not present

## 2019-10-27 DIAGNOSIS — I10 Essential (primary) hypertension: Secondary | ICD-10-CM | POA: Diagnosis not present

## 2019-10-27 DIAGNOSIS — R7301 Impaired fasting glucose: Secondary | ICD-10-CM | POA: Diagnosis not present

## 2019-10-27 DIAGNOSIS — Z0000 Encounter for general adult medical examination without abnormal findings: Secondary | ICD-10-CM | POA: Diagnosis not present

## 2019-10-27 DIAGNOSIS — J301 Allergic rhinitis due to pollen: Secondary | ICD-10-CM | POA: Diagnosis not present

## 2019-10-27 DIAGNOSIS — I739 Peripheral vascular disease, unspecified: Secondary | ICD-10-CM | POA: Diagnosis not present

## 2019-10-28 ENCOUNTER — Telehealth: Payer: Self-pay | Admitting: Oncology

## 2019-10-28 NOTE — Telephone Encounter (Signed)
Received a new pt referral from Dr. Lynann Bologna for hx of breast cancer. Pt has been cld and scheduled to see Dr. Jana Hakim on 11/1 at 4pm w/labs at 330pm. Letter mailed.

## 2019-10-29 ENCOUNTER — Other Ambulatory Visit (HOSPITAL_COMMUNITY): Payer: Self-pay | Admitting: Family Medicine

## 2019-10-29 ENCOUNTER — Other Ambulatory Visit: Payer: Self-pay | Admitting: Oncology

## 2019-10-29 DIAGNOSIS — Z853 Personal history of malignant neoplasm of breast: Secondary | ICD-10-CM

## 2019-10-29 DIAGNOSIS — Z170 Estrogen receptor positive status [ER+]: Secondary | ICD-10-CM

## 2019-10-29 DIAGNOSIS — R9389 Abnormal findings on diagnostic imaging of other specified body structures: Secondary | ICD-10-CM

## 2019-10-29 DIAGNOSIS — C50012 Malignant neoplasm of nipple and areola, left female breast: Secondary | ICD-10-CM

## 2019-10-29 DIAGNOSIS — C7951 Secondary malignant neoplasm of bone: Secondary | ICD-10-CM | POA: Insufficient documentation

## 2019-11-01 ENCOUNTER — Other Ambulatory Visit: Payer: Self-pay | Admitting: Oncology

## 2019-11-01 ENCOUNTER — Telehealth: Payer: Self-pay | Admitting: Oncology

## 2019-11-01 NOTE — Telephone Encounter (Signed)
Scheduled appts per sch msg. Called and left msg  

## 2019-11-03 ENCOUNTER — Other Ambulatory Visit: Payer: Self-pay

## 2019-11-03 ENCOUNTER — Inpatient Hospital Stay: Payer: Medicare Other | Attending: Oncology

## 2019-11-03 DIAGNOSIS — C50412 Malignant neoplasm of upper-outer quadrant of left female breast: Secondary | ICD-10-CM | POA: Insufficient documentation

## 2019-11-03 DIAGNOSIS — Z170 Estrogen receptor positive status [ER+]: Secondary | ICD-10-CM | POA: Insufficient documentation

## 2019-11-03 DIAGNOSIS — Z79811 Long term (current) use of aromatase inhibitors: Secondary | ICD-10-CM | POA: Insufficient documentation

## 2019-11-03 DIAGNOSIS — Z923 Personal history of irradiation: Secondary | ICD-10-CM | POA: Insufficient documentation

## 2019-11-03 DIAGNOSIS — C7951 Secondary malignant neoplasm of bone: Secondary | ICD-10-CM | POA: Insufficient documentation

## 2019-11-03 LAB — COMPREHENSIVE METABOLIC PANEL
ALT: 22 U/L (ref 0–44)
AST: 22 U/L (ref 15–41)
Albumin: 3.7 g/dL (ref 3.5–5.0)
Alkaline Phosphatase: 106 U/L (ref 38–126)
Anion gap: 9 (ref 5–15)
BUN: 13 mg/dL (ref 8–23)
CO2: 31 mmol/L (ref 22–32)
Calcium: 10.2 mg/dL (ref 8.9–10.3)
Chloride: 101 mmol/L (ref 98–111)
Creatinine, Ser: 0.92 mg/dL (ref 0.44–1.00)
GFR, Estimated: >60 mL/min (ref >60)
Glucose, Bld: 98 mg/dL (ref 70–99)
Potassium: 3.4 mmol/L — ABNORMAL LOW (ref 3.5–5.1)
Sodium: 141 mmol/L (ref 135–145)
Total Bilirubin: 0.5 mg/dL (ref 0.3–1.2)
Total Protein: 7.8 g/dL (ref 6.5–8.1)

## 2019-11-03 LAB — CBC WITH DIFFERENTIAL/PLATELET
Abs Immature Granulocytes: 0.03 10*3/uL (ref 0.00–0.07)
Basophils Absolute: 0 10*3/uL (ref 0.0–0.1)
Basophils Relative: 0 %
Eosinophils Absolute: 0.1 10*3/uL (ref 0.0–0.5)
Eosinophils Relative: 1 %
HCT: 38.6 % (ref 36.0–46.0)
Hemoglobin: 12.9 g/dL (ref 12.0–15.0)
Immature Granulocytes: 0 %
Lymphocytes Relative: 34 %
Lymphs Abs: 3.4 10*3/uL (ref 0.7–4.0)
MCH: 31 pg (ref 26.0–34.0)
MCHC: 33.4 g/dL (ref 30.0–36.0)
MCV: 92.8 fL (ref 80.0–100.0)
Monocytes Absolute: 0.8 10*3/uL (ref 0.1–1.0)
Monocytes Relative: 8 %
Neutro Abs: 5.6 10*3/uL (ref 1.7–7.7)
Neutrophils Relative %: 57 %
Platelets: 315 10*3/uL (ref 150–400)
RBC: 4.16 MIL/uL (ref 3.87–5.11)
RDW: 13.2 % (ref 11.5–15.5)
WBC: 10 10*3/uL (ref 4.0–10.5)
nRBC: 0 % (ref 0.0–0.2)

## 2019-11-03 LAB — CEA (IN HOUSE-CHCC): CEA (CHCC-In House): 2.33 ng/mL (ref 0.00–5.00)

## 2019-11-03 LAB — SAVE SMEAR(SSMR), FOR PROVIDER SLIDE REVIEW

## 2019-11-04 LAB — KAPPA/LAMBDA LIGHT CHAINS
Kappa free light chain: 25.8 mg/L — ABNORMAL HIGH (ref 3.3–19.4)
Kappa, lambda light chain ratio: 1.46 (ref 0.26–1.65)
Lambda free light chains: 17.7 mg/L (ref 5.7–26.3)

## 2019-11-04 LAB — CANCER ANTIGEN 27.29: CA 27.29: 41.3 U/mL — ABNORMAL HIGH (ref 0.0–38.6)

## 2019-11-05 ENCOUNTER — Inpatient Hospital Stay (HOSPITAL_BASED_OUTPATIENT_CLINIC_OR_DEPARTMENT_OTHER): Payer: Medicare Other | Admitting: Oncology

## 2019-11-05 ENCOUNTER — Encounter (HOSPITAL_COMMUNITY)
Admission: RE | Admit: 2019-11-05 | Discharge: 2019-11-05 | Disposition: A | Discharge status: Home / Self Care | Payer: Medicare Other | Source: Ambulatory Visit | Attending: Family Medicine | Admitting: Family Medicine

## 2019-11-05 ENCOUNTER — Other Ambulatory Visit: Payer: Self-pay

## 2019-11-05 VITALS — BP 128/81 | HR 88 | Temp 97.4°F | Resp 18 | Ht 64.0 in | Wt 117.6 lb

## 2019-11-05 DIAGNOSIS — Z79811 Long term (current) use of aromatase inhibitors: Secondary | ICD-10-CM

## 2019-11-05 DIAGNOSIS — R9389 Abnormal findings on diagnostic imaging of other specified body structures: Secondary | ICD-10-CM

## 2019-11-05 DIAGNOSIS — Z170 Estrogen receptor positive status [ER+]: Secondary | ICD-10-CM | POA: Diagnosis not present

## 2019-11-05 DIAGNOSIS — J984 Other disorders of lung: Secondary | ICD-10-CM | POA: Diagnosis not present

## 2019-11-05 DIAGNOSIS — M79604 Pain in right leg: Secondary | ICD-10-CM | POA: Diagnosis not present

## 2019-11-05 DIAGNOSIS — J432 Centrilobular emphysema: Secondary | ICD-10-CM | POA: Diagnosis not present

## 2019-11-05 DIAGNOSIS — Z923 Personal history of irradiation: Secondary | ICD-10-CM | POA: Diagnosis not present

## 2019-11-05 DIAGNOSIS — C7951 Secondary malignant neoplasm of bone: Secondary | ICD-10-CM

## 2019-11-05 DIAGNOSIS — C50412 Malignant neoplasm of upper-outer quadrant of left female breast: Secondary | ICD-10-CM | POA: Diagnosis not present

## 2019-11-05 DIAGNOSIS — I10 Essential (primary) hypertension: Secondary | ICD-10-CM

## 2019-11-05 DIAGNOSIS — Z853 Personal history of malignant neoplasm of breast: Secondary | ICD-10-CM

## 2019-11-05 DIAGNOSIS — I739 Peripheral vascular disease, unspecified: Secondary | ICD-10-CM

## 2019-11-05 DIAGNOSIS — C50919 Malignant neoplasm of unspecified site of unspecified female breast: Secondary | ICD-10-CM | POA: Diagnosis not present

## 2019-11-05 LAB — PROTEIN ELECTROPHORESIS, SERUM
A/G Ratio: 1.1 (ref 0.7–1.7)
Albumin ELP: 4 g/dL (ref 2.9–4.4)
Alpha-1-Globulin: 0.2 g/dL (ref 0.0–0.4)
Alpha-2-Globulin: 0.8 g/dL (ref 0.4–1.0)
Beta Globulin: 1.1 g/dL (ref 0.7–1.3)
Gamma Globulin: 1.4 g/dL (ref 0.4–1.8)
Globulin, Total: 3.5 g/dL (ref 2.2–3.9)
Total Protein ELP: 7.5 g/dL (ref 6.0–8.5)

## 2019-11-05 MED ORDER — ANASTROZOLE 1 MG PO TABS: 1.0000 mg | ORAL_TABLET | Freq: Every day | ORAL | 4 refills | Status: DC

## 2019-11-05 MED ORDER — PALBOCICLIB 125 MG PO CAPS: 125.0000 mg | ORAL_CAPSULE | Freq: Every day | ORAL | 6 refills | Status: DC

## 2019-11-05 MED ORDER — IOHEXOL 300 MG/ML  SOLN
75.0000 mL | Freq: Once | INTRAMUSCULAR | Status: AC | PRN
  Administered 2019-11-05: 75 mL via INTRAVENOUS

## 2019-11-05 MED ORDER — TECHNETIUM TC 99M MEDRONATE IV KIT
20.0000 | PACK | Freq: Once | INTRAVENOUS | Status: AC | PRN
  Administered 2019-11-05: 20 via INTRAVENOUS

## 2019-11-05 NOTE — Progress Notes (Signed)
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  Her previous history is summarized below.  She "graduated" from follow up in 12/2013.  More recently she presented to her PCP in 07/2019 with low back pain. She underwent lumbar spine x-ray on 08/10/2019 showing moderate scoliosis with associated degenerative disc disease.  She was referred to Leslie [name] in orthopedics. She proceeded to lumbar spine MRI on 10/04/2019 showing: widespread bone marrow abnormality; no pathologic fracture; severe neural foraminal stenosis at right L1 and L2 and left T10 and T11.  Leslie [name] contacted me and we set Leslie [name] up for some scans which were performed earlier today.  Unfortunately they do confirm multiple lytic and sclerotic bone lesions.  There is no evidence of visceral disease  Leslie [name] is here today to discuss those results   REVIEW OF SYSTEMS: Leslie [name] does have bony pain.  It is not fixed and persistent or progressive.  Right now it is localized to the right hip and little bit to the right upper leg.  She has some low back pain as well.  She is doing all her usual activities however, working at her church, cleaning her house, and working in the yard.  There have been no unusual headaches visual changes cough phlegm production pleurisy shortness  of breath or change in bowel or bladder habits.  Detailed review of systems today was otherwise stable.   HISTORY OF CURRENT ILLNESS: From Leslie [name] he patient evaluation note dated 12/07/2008:  "This is a pleasant 74 year old woman from [facility] referred by [facility] for evaluation and treatment of breast cancer. This woman has been in relatively good health.  She undergoes annual screening mammography.  Her husband is a patient of Leslie [name] who has lung cancer.  She had a screening mammogram on 11/11/2008, which showed an abnormality.  A follow up diagnostic mammogram and right breast ultrasound was performed.  This showed a hyperdense spiculated mass 12o'clock left breast measuring 5 x 3 x 1.5 cm.  There is an associated abnormality seen on ultrasound as well.  Biopsy was recommended with BSGI scan performed 11/23/2008 showed an uptake with a 3 cm of intense isotope activity.  An ultrasound-guided biopsy of both the breast mass in the left breast as well as left axilla was performed on 11/23/2008, both of which show invasive mammary carcinoma.  Prognostic panel showed this to be ER positive at 99%, PR positive at 7%, proliferative index 17%, HER-2 was negative with ratio of 1.2.  An E-cadherin test was performed, which was negative suggestive of lobular phenotype.  The patient has subsequently been referred for a neoadjuvant therapy.  Of note is she had an MRI scan on 12/01/2008, which showed a 4.9 x 3.5 x 2.9 cm area of lobular carcinoma 12 o'clock position left breast.  Axillary lymph nodes were also seen measuring 1.6 x 1.1 cm.  Multiple right breast cysts were also seen  as well."   The patient's subsequent history is as detailed below.   PAST MEDICAL HISTORY: Past Medical History:  Diagnosis Date  . Benign breast cyst in female    Right breast  . Cancer of left breast ([place]) 2010  . Depression   . History of blood transfusion    "related to tubal pregnancy"  . Hypertension   .  PAD (peripheral artery disease) ([place])     PAST SURGICAL HISTORY: Past Surgical History:  Procedure Laterality Date  . ABDOMINAL HYSTERECTOMY  1980s  . BREAST CYST EXCISION Right ~ 2005  . BREAST LUMPECTOMY Left 11/23/2008  . [place]  . LOWER EXTREMITY ANGIOGRAM  06/19/2017  . LOWER EXTREMITY ANGIOGRAPHY N/A 06/19/2017   Procedure: LOWER EXTREMITY ANGIOGRAPHY;  Surgeon: Leslie Harp, MD;  Location: [place] [facility];  Service: Cardiovascular;  Laterality: N/A;  . PERIPHERAL VASCULAR BALLOON ANGIOPLASTY Left 06/19/2017   S[facility]   . PERIPHERAL VASCULAR BALLOON ANGIOPLASTY  06/19/2017   Procedure: PERIPHERAL VASCULAR BALLOON ANGIOPLASTY;  Surgeon: Leslie Harp, MD;  Location: [place] [facility];  Service: Cardiovascular;;  SFA left  hysterectomy in her 51s secondary to tubal pregnancy, ovaries were intact   FAMILY HISTORY: Family History  Problem Relation Age of Onset  . Hypertension Mother   . Seizures Mother   . Heart disease Father   . Hypertension Sister    No history of breast or ovarian cancer in the family.  Father had a history of bad heart; mother died at age 57.    The patient has five sisters, two brothers, all of whom in good health.   GYNECOLOGIC HISTORY:  No LMP recorded. Patient has had a hysterectomy. Menarche: 74 years old Age at first live birth:    years old [place] 3 HRT yes  Hysterectomy? yes BSO? no   SOCIAL HISTORY: (updated 10/2019)  Leslie [name] "Leslie [name]" retired from working as a Engineer, mining for [employer]. She is widowed. Her husband of 77 years had a history of lung cancer. She lives by herself with no pets.  She has three grown sons that live in [place], [place], and [place].  The patient has 1 granddaughter, age 39, who is attending college studying to be an Chief Financial Officer.  The patient attends a [employer]    ADVANCED DIRECTIVES: not in place; the patient plans to name her son Leslie [name], who lives in  [place] (and works in Pharmacist, hospital) as her healthcare power of attorney.  He can be reached at [phone]   HEALTH MAINTENANCE: Social History   Tobacco Use  . Smoking status: Current Every Day Smoker    Packs/day: 0.50    Years: 45.00    Pack years: 22.50  . Smokeless tobacco: Never Used  Vaping Use  . Vaping Use: Never used  Substance Use Topics  . Alcohol use: Yes    Comment: 06/19/2017 "glass of wine 3-4 times/year"  . Drug use: Never     Colonoscopy: none on file  PAP: 2012?  Bone density:    Allergies  Allergen Reactions  . Aspirin Nausea Only    Current Outpatient Medications  Medication Sig Dispense Refill  . acetaminophen (TYLENOL) 500 MG tablet Take 500-1,000 mg by mouth every 6 (six) hours as needed for moderate pain or headache.    Marland Kitchen atenolol-chlorthalidone (TENORETIC) 50-25 MG per tablet Take 0.5 tablets by mouth daily.     . cholecalciferol (VITAMIN D) 400 units TABS tablet  Take 400 Units by mouth daily.    . Evening Primrose Oil 1000 MG CAPS Take 1,000 mg by mouth daily.    . fluocinonide cream (LIDEX) 7.82 % Apply 1 application topically 2 (two) times daily as needed (for break outs).    . folic acid (FOLVITE) 956 MCG tablet Take 400 mcg by mouth daily.     Marland Kitchen gabapentin (NEURONTIN) 300 MG capsule     . levocetirizine (XYZAL) 5 MG tablet Take 5 mg by mouth at bedtime.   3  . methocarbamol (ROBAXIN) 500 MG tablet Take 500 mg by mouth every 6 (six) hours as needed.    . Multiple Vitamin (MULTIVITAMIN PO) Take 1 tablet by mouth daily.     . traMADol (ULTRAM) 50 MG tablet Take 50 mg by mouth every 6 (six) hours as needed.    . triamcinolone cream (KENALOG) 0.1 % Apply topically.    . vitamin E 400 UNIT capsule Take 400 Units by mouth daily.     No current facility-administered medications for this visit.    OBJECTIVE: African-American woman who appears stated age  62:   11/05/19 1253  BP: 128/81  [name]: 88  Resp: 18  Temp: (!)  97.4 F (36.3 C)  SpO2: 98%     Body mass index is 20.19 kg/m.   Wt Readings from Last 3 Encounters:  11/05/19 117 lb 9.6 oz (53.3 kg)  10/26/19 116 lb (52.6 kg)  09/30/18 127 lb 3.2 oz (57.7 kg)      ECOG FS:1 - Symptomatic but completely ambulatory  Ocular: Sclerae unicteric, pupils round and equal Ear-nose-throat: Wearing a mask Lymphatic: No cervical or supraclavicular adenopathy Lungs no rales or rhonchi Heart regular rate and rhythm Abd soft, nontender, positive bowel sounds MSK no focal spinal tenderness, no joint edema Neuro: non-focal, well-oriented, appropriate affect Breasts:    LAB RESULTS:  CMP     Component Value Date/Time   NA 141 11/03/2019 1435   NA 145 (H) 06/04/2017 1513   NA 141 02/09/2013 1056   K 3.4 (L) 11/03/2019 1435   K 3.4 (L) 02/09/2013 1056   CL 101 11/03/2019 1435   CL 102 06/03/2012 1106   CO2 31 11/03/2019 1435   CO2 27 02/09/2013 1056   GLUCOSE 98 11/03/2019 1435   GLUCOSE 134 02/09/2013 1056   GLUCOSE 101 (H) 06/03/2012 1106   BUN 13 11/03/2019 1435   BUN 11 06/04/2017 1513   BUN 11.0 02/09/2013 1056   CREATININE 0.92 11/03/2019 1435   CREATININE 0.9 02/09/2013 1056   CALCIUM 10.2 11/03/2019 1435   CALCIUM 9.8 02/09/2013 1056   PROT 7.8 11/03/2019 1435   PROT 7.2 10/06/2018 1005   PROT 7.4 02/09/2013 1056   ALBUMIN 3.7 11/03/2019 1435   ALBUMIN 4.4 10/06/2018 1005   ALBUMIN 4.0 02/09/2013 1056   AST 22 11/03/2019 1435   AST 19 02/09/2013 1056   ALT 22 11/03/2019 1435   ALT 20 02/09/2013 1056   ALKPHOS 106 11/03/2019 1435   ALKPHOS 68 02/09/2013 1056   BILITOT 0.5 11/03/2019 1435   BILITOT 0.5 10/06/2018 1005   BILITOT 0.49 02/09/2013 1056   GFRNONAA >60 11/03/2019 1435   GFRAA >60 06/20/2017 0249    No results found for: TOTALPROTELP, ALBUMINELP, A1GS, A2GS, BETS, BETA2SER, GAMS, MSPIKE, SPEI  Lab Results  Component Value Date   WBC 10.0 11/03/2019   NEUTROABS 5.6 11/03/2019   HGB 12.9 11/03/2019   HCT 38.6  11/03/2019   MCV 92.8 11/03/2019  PLT 315 11/03/2019    Lab Results  Component Value Date   LABCA2 33 12/05/2011    No components found for: [id number]  No results for input(s): INR in the last 168 hours.  Lab Results  Component Value Date   LABCA2 33 12/05/2011    No results found for: CAN199  No results found for: CAN125  No results found for: JQB341  Lab Results  Component Value Date   CA2729 41.3 (H) 11/03/2019    No components found for: HGQUANT  Lab Results  Component Value Date   CEA1 2.33 11/03/2019   /  CEA ([facility])  Date Value Ref Range Status  11/03/2019 2.33 0.00 - 5.00 ng/mL Final    Comment:    (NOTE) This test was performed using Architect's Chemiluminescent Microparticle Immunoassay. Values obtained from different assay methods cannot be used interchangeably. Please note that 5-10% of patients who smoke may see CEA levels up to 6.9 ng/mL. Performed at [facility], [place] [address] [place], [place] [address]      No results found for: AFPTUMOR  No results found for: [facility]  Lab Results  Component Value Date   KPAFRELGTCHN 25.8 (H) 11/03/2019   LAMBDASER 17.7 11/03/2019   KAPLAMBRATIO 1.46 11/03/2019   (kappa/lambda light chains)  No results found for: HGBA, HGBA2QUANT, HGBFQUANT, HGBSQUAN (Hemoglobinopathy evaluation)   Lab Results  Component Value Date   LDH 182 06/03/2012    No results found for: IRON, TIBC, IRONPCTSAT (Iron and TIBC)  No results found for: FERRITIN  Urinalysis    Component Value Date/Time   COLORURINE YELLOW 08/17/2010 1105   APPEARANCEUR CLEAR 08/17/2010 1105   LABSPEC 1.017 08/17/2010 1105   PHURINE 8.0 08/17/2010 1105   GLUCOSEU NEGATIVE 08/17/2010 1105   HGBUR NEGATIVE 08/17/2010 1105   BILIRUBINUR NEGATIVE 08/17/2010 1105   KETONESUR NEGATIVE 08/17/2010 1105   PROTEINUR NEGATIVE 08/17/2010 1105   UROBILINOGEN 0.2 08/17/2010 1105   NITRITE NEGATIVE  08/17/2010 1105   LEUKOCYTESUR NEGATIVE 08/17/2010 1105     STUDIES: CT CHEST W CONTRAST  Result Date: 11/05/2019 CLINICAL DATA:  Metastatic breast cancer with osseous lesions on lumbar MRI. EXAM: CT CHEST WITH CONTRAST TECHNIQUE: Multidetector CT imaging of the chest was performed during intravenous contrast administration. CONTRAST:  80m OMNIPAQUE IOHEXOL 300 MG/ML  SOLN COMPARISON:  Lumbar MRI 10/04/2019.  Chest CT 12/21/2008. FINDINGS: Cardiovascular: Mild aortic and coronary artery atherosclerosis. No acute vascular findings. The heart size is normal. There is no pericardial effusion. Mediastinum/Nodes: There are no enlarged mediastinal, hilar, axillary or internal mammary lymph nodes. Postsurgical changes are present within the left axilla and left breast. The thyroid gland, trachea and esophagus demonstrate no significant findings. Lungs/Pleura: There is no pleural effusion. Mild centrilobular emphysema with mild cylindrical bronchiectasis at both lung bases. There is subpleural scarring anteriorly in the left upper lobe attributed to prior radiation therapy. There is linear atelectasis or scarring in the left lower lobe. No suspicious pulmonary nodules. 4 mm perifissural nodule along the superior aspect of the left major fissure on image 60/4 is unchanged. Upper abdomen: The visualized upper abdomen appears unchanged, without suspicious findings. Musculoskeletal/Chest wall: As demonstrated on recent lumbar MRI, there is widespread lytic and blastic metastatic disease throughout the visualized thoracolumbar spine, ribs and sternum. There is a Schmorl's node involving the superior endplate of T9 which could be pathologic. Right lateral soft tissue density at T10-11 likely represents a lateral disc extrusion. No evidence of epidural tumor or canal compromise.  IMPRESSION: 1. Widespread lytic and blastic metastatic disease throughout the visualized thoracolumbar spine, ribs and sternum, similar to  recent lumbar MRI. 2. No evidence of extra osseous metastatic disease. 3. Probable right lateral disc extrusion at T10-11 without evidence of epidural tumor or canal compromise. 4. Aortic Atherosclerosis (ICD10-I70.0) and Emphysema (ICD10-J43.9). Electronically Signed   By: Richardean Sale M.D.   On: 11/05/2019 09:32   NM Bone Scan Whole Body  Result Date: 11/05/2019 CLINICAL DATA:  History of breast cancer.  Right leg pain. EXAM: NUCLEAR MEDICINE WHOLE BODY BONE SCAN TECHNIQUE: Whole body anterior and posterior images were obtained approximately 3 hours after intravenous injection of radiopharmaceutical. RADIOPHARMACEUTICALS:  22.0 mCi Technetium-68mMDP IV COMPARISON:  December 21, 2008. FINDINGS: Multiple areas of abnormal uptake are seen involving the ribs bilaterally as well as the thoracic and lumbar spine consistent with metastatic disease. Abnormal uptake is seen involving the right acetabulum and proximal right femur as well as the pubic rami bilaterally on both sides of the pubic symphysis. Abnormal uptake is noted in the right shoulder which is asymmetric compared to the other side, concerning for metastatic disease. IMPRESSION: Multiple foci of osseous metastatic disease is seen involving the ribs, spine, pelvis and proximal right femur. Electronically Signed   By: JMarijo ConceptionM.D.   On: 11/05/2019 12:39   DG Epidural/Nerve Root  Result Date: 10/15/2019 CLINICAL DATA:  Right L2 radiculopathy. Right L2-3 foraminal stenosis. Left-sided low back pain. Scoliosis. FLUOROSCOPY TIME:  Radiation Exposure Index (as provided by the fluoroscopic device): 13.54 uGy*m2 PROCEDURE: SELECTIVE NERVE ROOT AND TRANSFORAMINAL EPIDURAL STEROID INJECTION: A transforaminal approach was performed on the right at the L2-3 foramen. The overlying skin was cleansed and anesthetized. A 22 gauge spinal needle was advanced into the foramen from a lateral oblique approach. Injection of 2cc of Isovue M 200 outlined the nerve  root and extended into the epidural space. No vascular or intrathecal spread is evident. I then injected 120 mg of Depo-Medrol and 1.5 ml of 1% lidocaine. The patient tolerated the procedure without evidence for complication. The patient was observed for 20 minutes prior to discharge in stable neurologic condition. IMPRESSION: Technically successful selective nerve root block and transforaminal epidural steroid injection on the right at the L2-3 foramen. Electronically Signed   By: CSan MorelleM.D.   On: 10/15/2019 10:36     ELIGIBLE FOR AVAILABLE RESEARCH PROTOCOL: no  ASSESSMENT: 74y.o. [place] woman  1.  Status post left breast upper outer quadrant biopsy and left axillary needle core biopsy on 11/23/2008 for clinical T3 N1, stage IIA invasive lobular carcinoma, E-cadherin negative, estrogen and progesterone receptor positive, HER-2 not amplified, with an MIB-1 of 17% Ki-67 17%  2.    On neoadjuvant letrozole started in 12/2008, interrupted at the time of her definitive surgery, then resumed post-op  4.  Status post left breast lumpectomy with regional lymph node resection 07/12/2009 for a stage IIA, pT1c pN1, invasive lobular carcinoma,with angiolymphatic invasion identified  (a) 2 of 8 removed left nodes were involved   (b) repeat prognostic panel again estrogen receptor positive, not progesterone receptor negative, with an MIB-1 of 74% and HER-2 not amplified   (c) additional surgery for margin clearance 08/04/2009 showed only atypical lobular hyperplasia  5. Oncotype score of 20 predicts a 5-year rate of recurrence outside the breast with antiestrogens as the only systemic therapy of 13%.  It also predicts no significant benefit from chemotherapy.   6.  status post radiation  therapy 09/26/2009-11/11/18  7.  Letrozole continued through December 2015 (5 years  METASTATIC DISEASE: 8.  Lumbar MRI 10/04/2019 suggests metastatic disease to bone  (a) CT of the chest and  bone scan 11/05/2019 confirm multiple lytic and sclerotic bone lesions, but no evidence of visceral disease  (b) bone marrow biopsy  (c) CA 27-29 and CEA are not informative (CA 27-29 is 41.3 on 11/03/2019  9.  Zoledronate to start 11/10/2019, repeat every 12 weeks  10.  Anastrozole started 11/05/2019  (a) palbociclib 125 mg daily, 21/7, to start 12/01/2019   PLAN: I discussed the current situation with [facility].  She understands she does not have bone cancer, but breast cancer in the bone.  This is treated as breast cancer since that is what it is.  The treatment for bone cancer would be completely different.  She understands that stage IV breast cancer is not curable with our current knowledge base. The goal of treatment is control. The strategy of treatment is to do only the minimum necessary to control the growth of the tumor so that the patient can have as normal a life as possible. There is no survival advantage in treating aggressively if treating less aggressively results in tumor control. With this strategy stage IV breast cancer in many cases can function as a "chronic illness": something that cannot be quite gotten rid of but can be controlled for an indefinite period of time  Since we are dealing with lobular breast cancer it is likely still estrogen receptor positive but we do need to obtain some tissue.  She will be set up for a bone marrow biopsy and hopefully that will give [place] the information we need.  Otherwise we will do a cerianna scan.  We will start zoledronate next week.  She understands this can cause transient hypocalcemia, bony aches for a day or 2, and rarely osteonecrosis of the jaw.  The latter occurs more frequently if the patient has teeth extracted or implanted.  Leslie [name] tells me her teeth are in good shape and she saw her dentist within the last 2 months.  I am starting her on anastrozole now.  We will add palbociclib when she returns to see me  12/01/2019.  Total encounter time 55 minutes.   Leslie [name]. Kiaira Pointer, MD 11/05/2019 1:46 PM Medical Oncology and Hematology [facility] [place], [place] [address] Tel. [phone]    Fax. [phone]   This document serves as a record of services personally performed by Lurline Del, MD. It was created on his behalf by Wilburn Mylar, a trained medical scribe. The creation of this record is based on the scribe's personal observations and the provider's statements to them.   I, Lurline Del MD, have reviewed the above documentation for accuracy and completeness, and I agree with the above.    *Total Encounter Time as defined by the Centers for Medicare and Medicaid Services includes, in addition to the face-to-face time of a patient visit (documented in the note above) non-face-to-face time: obtaining and reviewing outside history, ordering and reviewing medications, tests or procedures, care coordination (communications with other health care professionals or caregivers) and documentation in the medical record.

## 2019-11-08 ENCOUNTER — Telehealth: Payer: Self-pay | Admitting: Oncology

## 2019-11-08 NOTE — Telephone Encounter (Signed)
Scheduled appt per 10/15 LOS _ pt aware of appt date and time

## 2019-11-09 ENCOUNTER — Inpatient Hospital Stay: Payer: Medicare Other

## 2019-11-09 ENCOUNTER — Other Ambulatory Visit: Payer: Self-pay

## 2019-11-09 VITALS — BP 142/82 | HR 73 | Temp 98.3°F | Resp 18

## 2019-11-09 DIAGNOSIS — C50412 Malignant neoplasm of upper-outer quadrant of left female breast: Secondary | ICD-10-CM | POA: Diagnosis not present

## 2019-11-09 DIAGNOSIS — C7951 Secondary malignant neoplasm of bone: Secondary | ICD-10-CM

## 2019-11-09 DIAGNOSIS — Z923 Personal history of irradiation: Secondary | ICD-10-CM | POA: Diagnosis not present

## 2019-11-09 DIAGNOSIS — Z79811 Long term (current) use of aromatase inhibitors: Secondary | ICD-10-CM | POA: Diagnosis not present

## 2019-11-09 DIAGNOSIS — Z170 Estrogen receptor positive status [ER+]: Secondary | ICD-10-CM | POA: Diagnosis not present

## 2019-11-09 MED ORDER — ZOLEDRONIC ACID 4 MG/100ML IV SOLN
INTRAVENOUS | Status: AC
  Filled 2019-11-09: qty 100

## 2019-11-09 MED ORDER — ZOLEDRONIC ACID 4 MG/100ML IV SOLN
4.0000 mg | Freq: Once | INTRAVENOUS | Status: AC
  Administered 2019-11-09: 4 mg via INTRAVENOUS

## 2019-11-09 NOTE — Patient Instructions (Signed)
Zoledronic Acid injection (Hypercalcemia, Oncology) What is this medicine? ZOLEDRONIC ACID (ZOE le dron ik AS id) lowers the amount of calcium loss from bone. It is used to treat too much calcium in your blood from cancer. It is also used to prevent complications of cancer that has spread to the bone. This medicine may be used for other purposes; ask your health care provider or pharmacist if you have questions. COMMON BRAND NAME(S): Zometa What should I tell my health care provider before I take this medicine? They need to know if you have any of these conditions:  aspirin-sensitive asthma  cancer, especially if you are receiving medicines used to treat cancer  dental disease or wear dentures  infection  kidney disease  receiving corticosteroids like dexamethasone or prednisone  an unusual or allergic reaction to zoledronic acid, other medicines, foods, dyes, or preservatives  pregnant or trying to get pregnant  breast-feeding How should I use this medicine? This medicine is for infusion into a vein. It is given by a health care professional in a hospital or clinic setting. Talk to your pediatrician regarding the use of this medicine in children. Special care may be needed. Overdosage: If you think you have taken too much of this medicine contact a poison control center or emergency room at once. NOTE: This medicine is only for you. Do not share this medicine with others. What if I miss a dose? It is important not to miss your dose. Call your doctor or health care professional if you are unable to keep an appointment. What may interact with this medicine?  certain antibiotics given by injection  NSAIDs, medicines for pain and inflammation, like ibuprofen or naproxen  some diuretics like bumetanide, furosemide  teriparatide  thalidomide This list may not describe all possible interactions. Give your health care provider a list of all the medicines, herbs, non-prescription  drugs, or dietary supplements you use. Also tell them if you smoke, drink alcohol, or use illegal drugs. Some items may interact with your medicine. What should I watch for while using this medicine? Visit your doctor or health care professional for regular checkups. It may be some time before you see the benefit from this medicine. Do not stop taking your medicine unless your doctor tells you to. Your doctor may order blood tests or other tests to see how you are doing. Women should inform their doctor if they wish to become pregnant or think they might be pregnant. There is a potential for serious side effects to an unborn child. Talk to your health care professional or pharmacist for more information. You should make sure that you get enough calcium and vitamin D while you are taking this medicine. Discuss the foods you eat and the vitamins you take with your health care professional. Some people who take this medicine have severe bone, joint, and/or muscle pain. This medicine may also increase your risk for jaw problems or a broken thigh bone. Tell your doctor right away if you have severe pain in your jaw, bones, joints, or muscles. Tell your doctor if you have any pain that does not go away or that gets worse. Tell your dentist and dental surgeon that you are taking this medicine. You should not have major dental surgery while on this medicine. See your dentist to have a dental exam and fix any dental problems before starting this medicine. Take good care of your teeth while on this medicine. Make sure you see your dentist for regular follow-up   appointments. What side effects may I notice from receiving this medicine? Side effects that you should report to your doctor or health care professional as soon as possible:  allergic reactions like skin rash, itching or hives, swelling of the face, lips, or tongue  anxiety, confusion, or depression  breathing problems  changes in vision  eye  pain  feeling faint or lightheaded, falls  jaw pain, especially after dental work  mouth sores  muscle cramps, stiffness, or weakness  redness, blistering, peeling or loosening of the skin, including inside the mouth  trouble passing urine or change in the amount of urine Side effects that usually do not require medical attention (report to your doctor or health care professional if they continue or are bothersome):  bone, joint, or muscle pain  constipation  diarrhea  fever  hair loss  irritation at site where injected  loss of appetite  nausea, vomiting  stomach upset  trouble sleeping  trouble swallowing  weak or tired This list may not describe all possible side effects. Call your doctor for medical advice about side effects. You may report side effects to FDA at 1-800-FDA-1088. Where should I keep my medicine? This drug is given in a hospital or clinic and will not be stored at home. NOTE: This sheet is a summary. It may not cover all possible information. If you have questions about this medicine, talk to your doctor, pharmacist, or health care provider.  2020 Elsevier/Gold Standard (2013-06-05 14:19:39)  

## 2019-11-15 ENCOUNTER — Telehealth: Payer: Self-pay

## 2019-11-15 ENCOUNTER — Other Ambulatory Visit: Payer: Self-pay | Admitting: Oncology

## 2019-11-15 ENCOUNTER — Telehealth: Payer: Self-pay | Admitting: Pharmacist

## 2019-11-15 DIAGNOSIS — C7951 Secondary malignant neoplasm of bone: Secondary | ICD-10-CM

## 2019-11-15 MED ORDER — PALBOCICLIB 125 MG PO TABS: 125.0000 mg | ORAL_TABLET | Freq: Every day | ORAL | 6 refills | Status: DC

## 2019-11-15 NOTE — Telephone Encounter (Signed)
Oral Oncology Patient Advocate Encounter  Received notification from [facility] that prior authorization for Leslie [name] is required.  PA submitted on CoverMyMeds Key [id number] Status is pending  Oral Oncology Clinic will continue to follow.  [place] Patient [place] Phone [phone] Fax [phone] 11/15/2019 12:40 PM

## 2019-11-15 NOTE — Telephone Encounter (Signed)
Oral Oncology Patient Advocate Encounter  Prior Authorization for Leslee Home has been approved.    PA# [id number] Effective dates: 11/15/19 through 01/20/21  Patients co-pay is $2816  Oral Oncology Clinic will continue to follow.   [place] Patient [place] Phone [phone] Fax [phone] 11/15/2019 1:18 PM

## 2019-11-16 NOTE — Telephone Encounter (Signed)
Oral Oncology Pharmacist Encounter  Received new prescription for Ibrance (palbociclib) for the treatment of metastatic, previously HR+, HER2- breast cancer in conjunction with anastrozole, planned duration until disease progression or unacceptable drug toxicity. Confirmation of HR status pending bone marrow biopsy/[place] scan.   Labs from 11/03/2019 assessed, CBC and CMP wnl. Prescription dose and frequency assessed.   Current medication list in Epic reviewed, no DDIs with Ibrance identified. Omeprazole is not on patients medication list, but she does have a recent 90 day fill noted on 09/20/2019. Though data are few, concomitant PPI use with Leslee Home has been associated with shorter PFS and therefore patient should be educated on interaction and consider use of famotidine for acid suppression instead of omeprazole.   Prescription has been e-scribed to the [facility] for benefits analysis and approval.  Oral Oncology Clinic will continue to follow for copayment issues, initial counseling and start date.  Eddie Candle, PharmD PGY2 Hematology/Oncology Pharmacy Resident Oral Chemotherapy [facility] 11/16/2019 2:55 PM

## 2019-11-17 ENCOUNTER — Telehealth: Payer: Self-pay

## 2019-11-17 DIAGNOSIS — Z170 Estrogen receptor positive status [ER+]: Secondary | ICD-10-CM

## 2019-11-17 DIAGNOSIS — C50412 Malignant neoplasm of upper-outer quadrant of left female breast: Secondary | ICD-10-CM

## 2019-11-17 NOTE — Telephone Encounter (Signed)
This LPN called pt as she needs BMBX per Dr Jana Hakim. Pt agreed to 10/29 at 0730 for BMBX. And understands to arrive at [facility] for check in.   Scheduling message sent for pt to be scheduled and labs to be done. Flowtometry made aware of appt as well. Message sent to Wilber Bihari, NP for her to also be aware.

## 2019-11-19 ENCOUNTER — Inpatient Hospital Stay (HOSPITAL_BASED_OUTPATIENT_CLINIC_OR_DEPARTMENT_OTHER): Payer: Medicare Other | Admitting: Adult Health

## 2019-11-19 ENCOUNTER — Telehealth: Payer: Self-pay

## 2019-11-19 ENCOUNTER — Inpatient Hospital Stay: Payer: Medicare Other

## 2019-11-19 ENCOUNTER — Other Ambulatory Visit: Payer: Self-pay

## 2019-11-19 VITALS — BP 115/72 | HR 65 | Temp 98.4°F | Resp 18

## 2019-11-19 DIAGNOSIS — C50412 Malignant neoplasm of upper-outer quadrant of left female breast: Secondary | ICD-10-CM

## 2019-11-19 DIAGNOSIS — Z79811 Long term (current) use of aromatase inhibitors: Secondary | ICD-10-CM | POA: Diagnosis not present

## 2019-11-19 DIAGNOSIS — C7952 Secondary malignant neoplasm of bone marrow: Secondary | ICD-10-CM | POA: Diagnosis not present

## 2019-11-19 DIAGNOSIS — Z170 Estrogen receptor positive status [ER+]: Secondary | ICD-10-CM

## 2019-11-19 DIAGNOSIS — C7951 Secondary malignant neoplasm of bone: Secondary | ICD-10-CM

## 2019-11-19 DIAGNOSIS — Z923 Personal history of irradiation: Secondary | ICD-10-CM | POA: Diagnosis not present

## 2019-11-19 DIAGNOSIS — C801 Malignant (primary) neoplasm, unspecified: Secondary | ICD-10-CM | POA: Diagnosis not present

## 2019-11-19 LAB — CBC WITH DIFFERENTIAL (CANCER CENTER ONLY)
Abs Immature Granulocytes: 0.05 10*3/uL (ref 0.00–0.07)
Basophils Absolute: 0 10*3/uL (ref 0.0–0.1)
Basophils Relative: 0 %
Eosinophils Absolute: 0.1 10*3/uL (ref 0.0–0.5)
Eosinophils Relative: 1 %
HCT: 37.1 % (ref 36.0–46.0)
Hemoglobin: 12.3 g/dL (ref 12.0–15.0)
Immature Granulocytes: 1 %
Lymphocytes Relative: 34 %
Lymphs Abs: 3.2 10*3/uL (ref 0.7–4.0)
MCH: 30.6 pg (ref 26.0–34.0)
MCHC: 33.2 g/dL (ref 30.0–36.0)
MCV: 92.3 fL (ref 80.0–100.0)
Monocytes Absolute: 0.9 10*3/uL (ref 0.1–1.0)
Monocytes Relative: 9 %
Neutro Abs: 5.3 10*3/uL (ref 1.7–7.7)
Neutrophils Relative %: 55 %
Platelet Count: 397 10*3/uL (ref 150–400)
RBC: 4.02 MIL/uL (ref 3.87–5.11)
RDW: 12.6 % (ref 11.5–15.5)
WBC Count: 9.4 10*3/uL (ref 4.0–10.5)
nRBC: 0 % (ref 0.0–0.2)

## 2019-11-19 LAB — CMP (CANCER CENTER ONLY)
ALT: 25 U/L (ref 0–44)
AST: 20 U/L (ref 15–41)
Albumin: 3.7 g/dL (ref 3.5–5.0)
Alkaline Phosphatase: 98 U/L (ref 38–126)
Anion gap: 10 (ref 5–15)
BUN: 9 mg/dL (ref 8–23)
CO2: 29 mmol/L (ref 22–32)
Calcium: 9.6 mg/dL (ref 8.9–10.3)
Chloride: 100 mmol/L (ref 98–111)
Creatinine: 0.77 mg/dL (ref 0.44–1.00)
GFR, Estimated: >60 mL/min (ref >60)
Glucose, Bld: 99 mg/dL (ref 70–99)
Potassium: 3.3 mmol/L — ABNORMAL LOW (ref 3.5–5.1)
Sodium: 139 mmol/L (ref 135–145)
Total Bilirubin: 0.4 mg/dL (ref 0.3–1.2)
Total Protein: 7.9 g/dL (ref 6.5–8.1)

## 2019-11-19 NOTE — Patient Instructions (Signed)
Bone Marrow Aspiration and Bone Marrow Biopsy, Adult, Care After This sheet gives you information about how to care for yourself after your procedure. Your health care provider may also give you more specific instructions. If you have problems or questions, contact your health care provider. What can I expect after the procedure? After the procedure, it is common to have:  Mild pain and tenderness.  Swelling.  Bruising. Follow these instructions at home: Puncture site care   Follow instructions from your health care provider about how to take care of the puncture site. Make sure you: ? Wash your hands with soap and water before and after you change your bandage (dressing). If soap and water are not available, use hand sanitizer. ? Change your dressing as told by your health care provider.  Check your puncture site every day for signs of infection. Check for: ? More redness, swelling, or pain. ? Fluid or blood. ? Warmth. ? Pus or a bad smell. Activity  Return to your normal activities as told by your health care provider. Ask your health care provider what activities are safe for you.  Do not lift anything that is heavier than 10 lb (4.5 kg), or the limit that you are told, until your health care provider says that it is safe.  Do not drive for 24 hours if you were given a sedative during your procedure. General instructions   Take over-the-counter and prescription medicines only as told by your health care provider.  Do not take baths, swim, or use a hot tub until your health care provider approves. Ask your health care provider if you may take showers. You may only be allowed to take sponge baths.  If directed, put ice on the affected area. To do this: ? Put ice in a plastic bag. ? Place a towel between your skin and the bag. ? Leave the ice on for 20 minutes, 2-3 times a day.  Keep all follow-up visits as told by your health care provider. This is important. Contact a  health care provider if:  Your pain is not controlled with medicine.  You have a fever.  You have more redness, swelling, or pain around the puncture site.  You have fluid or blood coming from the puncture site.  Your puncture site feels warm to the touch.  You have pus or a bad smell coming from the puncture site. Summary  After the procedure, it is common to have mild pain, tenderness, swelling, and bruising.  Follow instructions from your health care provider about how to take care of the puncture site and what activities are safe for you.  Take over-the-counter and prescription medicines only as told by your health care provider.  Contact a health care provider if you have any signs of infection, such as fluid or blood coming from the puncture site. This information is not intended to replace advice given to you by your health care provider. Make sure you discuss any questions you have with your health care provider. Document Revised: 05/26/2018 Document Reviewed: 05/26/2018 Elsevier Patient Education  2020 Elsevier Inc.  

## 2019-11-19 NOTE — Telephone Encounter (Signed)
Oral Oncology Patient Advocate Encounter  Met patient in [facility] to complete application for [employer] Oncology Together in an effort to reduce patient's out of pocket expense for Ibrance to $0.    Application completed and faxed to [phone].   [employer] patient assistance phone number for follow up is [phone].   This encounter will be updated until final determination.   Elizabeth Tilley CPHT Specialty Pharmacy Patient Advocate [facility] Phone [phone] Fax [phone] 11/19/2019 9:56 AM  

## 2019-11-19 NOTE — Progress Notes (Signed)
INDICATION: metastatic breast cancer, r/o marrow involve,emt  Brief examination was performed. ENT: adequate airway clearance Heart: regular rate and rhythm.No Murmurs Lungs: clear to auscultation, no wheezes, normal respiratory effort  Bone Marrow Biopsy and Aspiration Procedure Note   Informed consent was obtained and potential risks including bleeding, infection and pain were reviewed with the patient.  The patient's name, date of birth, identification, consent and allergies were verified prior to the start of procedure and time out was performed.  The left posterior iliac crest was chosen as the site of biopsy.  The skin was prepped with ChloraPrep.   8 cc of 2% lidocaine was used to provide local anaesthesia.   10 cc of bone marrow aspirate was obtained followed by 1cm biopsy.  Pressure was applied to the biopsy site and bandage was placed over the biopsy site. Patient was made to lie on the back for 30 mins prior to discharge.  The procedure was tolerated well. COMPLICATIONS: None BLOOD LOSS: none The patient was discharged home in stable condition with a 1 week follow up to review results.  Patient was provided with post bone marrow biopsy instructions and instructed to call if there was any bleeding or worsening pain.  Specimens sent for flow cytometry, cytogenetics and additional studies.  Signed Scot Dock, NP

## 2019-11-19 NOTE — Telephone Encounter (Signed)
-----   Message from Gardenia Phlegm, NP sent at 11/19/2019  1:57 PM EDT ----- Please counsel patient on potassium rich diet ----- Message ----- From: Buel Ream, Lab In [place] Sent: 11/19/2019   9:52 AM EDT To: Gardenia Phlegm, NP

## 2019-11-19 NOTE — Telephone Encounter (Signed)
This LPN spoke with pt and educated pt on K+ rich foods. Pt verbalized understanding and states she will improve diet to increase K+. Pt understands to call with any concerns.

## 2019-11-19 NOTE — Progress Notes (Signed)
.  Patient arrived today for bone marrow aspiration. Patient tolerated procedure well. Vitals signs remained stable post procedure and no bleeding noted after 30 minute wait. Patient educated on post procedure care and signs/symptoms for when to call doctor. Patient verbalized understanding and sent to lab for blood work.

## 2019-11-22 ENCOUNTER — Ambulatory Visit: Payer: Medicare Other | Admitting: Oncology

## 2019-11-22 ENCOUNTER — Other Ambulatory Visit: Payer: Medicare Other

## 2019-11-24 ENCOUNTER — Telehealth: Payer: Self-pay | Admitting: Adult Health

## 2019-11-24 NOTE — Telephone Encounter (Signed)
No 10/29 los, no changes made to pt schedule

## 2019-11-25 DIAGNOSIS — Z23 Encounter for immunization: Secondary | ICD-10-CM | POA: Diagnosis not present

## 2019-11-25 NOTE — Telephone Encounter (Signed)
Patient is approved for Ibrance at no charge from [employer] 11/25/19-01/21/20.  [place] Patient [place] Phone [phone] Fax [phone] 11/25/2019 1:34 PM

## 2019-11-26 ENCOUNTER — Telehealth: Payer: Self-pay | Admitting: Pharmacist

## 2019-11-26 ENCOUNTER — Encounter (HOSPITAL_COMMUNITY): Payer: Self-pay | Admitting: Oncology

## 2019-11-26 DIAGNOSIS — C50412 Malignant neoplasm of upper-outer quadrant of left female breast: Secondary | ICD-10-CM

## 2019-11-26 DIAGNOSIS — Z170 Estrogen receptor positive status [ER+]: Secondary | ICD-10-CM

## 2019-11-26 MED ORDER — ANASTROZOLE 1 MG PO TABS: 1.0000 mg | ORAL_TABLET | Freq: Every day | ORAL | 4 refills | Status: DC

## 2019-11-26 NOTE — Telephone Encounter (Signed)
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   Counseled patient on administration, dosing, side effects, monitoring, drug-food interactions, safe handling, storage, and disposal.  Patient will take Ibrance $RemoveBefo'125mg'[id number]$  tablets, 1 tablet by mouth once daily, with or without food, taken for 3 weeks on, 1 week off, and repeated.  Patient knows to avoid grapefruit and grapefruit juice while on treatment with Ibrance.  Patient will start taking anastrozole 1 mg tablet once daily. This medication was originally escribed on 11/05/19, but due to system wide downtime never delivered to the pharmacy. Rx for anastrozole has been redirected to the [facility] on [address] per patient's request.  Leslee Home start date: pending office visit with MD on 12/01/19  Adverse effects include but are not limited to: fatigue, hair loss, GI upset, nausea, decreased blood counts, and increased upper respiratory infections. Severe, life-threatening, and/or fatal interstitial lung disease (ILD) and/or pneumonitis may occur with CDK 4/6 inhibitors.  Patient will obtain anti diarrheal and alert the office of 4 or more loose stools above baseline.  Patient reminded of WBC check on Cycle 1 Day 14 for dose and ANC assessment.  Reviewed with patient importance of keeping a medication schedule and plan for any missed doses. No barriers to medication adherence identified.  Medication reconciliation performed and medication/allergy list updated.  Insurance authorization for Leslee Home has been obtained. Test claim at the pharmacy revealed copayment [phone] for 1st fill of Ibrance. This was unaffordable for patient. Patient has been approved for [employer] Oncology Together patient assistance program for  Leslee Home to receive the medication for $0. Patient provided with [employer]'s contact phone number to set up shipment of medication (phone number: [phone])  All questions answered.  Ms. Cormier voiced understanding and appreciation.   Medication education handout placed in mail for patient. Patient knows to call the office with questions or concerns. Oral Chemotherapy Clinic phone number provided to patient.   Leron Croak, PharmD, BCPS Hematology/Oncology Clinical Pharmacist [facility] [phone] 11/26/2019 2:36 PM

## 2019-11-28 LAB — SURGICAL PATHOLOGY

## 2019-11-30 NOTE — Progress Notes (Signed)
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 She was re-evaluated in the breast cancer clinic on 11/05/2019.  She was started on anastrozole at her re-evaluation visit.  She is tolerating this well, with no change in her baseline hot flashes.  She was also started on zolendronate on 11/09/2019.  She had no side effects from this dose that she is aware of  Since her last visit, she underwent bone marrow biopsy on 11/19/2019. Pathology from the procedure [phone]) confirmed metastatic breast carcinoma.  The tumor is estrogen receptor positive although weakly so, and progesterone receptor negative.  It is gross cystic disease fluid protein positive  She is scheduled to begin palbociclib this week   REVIEW OF SYSTEMS: Leslie [name] continues to do her normal activities.  She works at her church, she does all her housework cooking shopping etc.  A detailed review of systems was otherwise noncontributory   COVID 19 VACCINATION STATUS:    HISTORY OF CURRENT ILLNESS: From [name] he patient evaluation note dated 12/07/2008:  "This is a pleasant 74 year old woman from [place] referred by [facility] for evaluation and treatment of breast cancer. This woman has been in relatively good health.  She undergoes annual screening mammography.  Her  husband is a patient of Dr. [name] who has lung cancer.  She had a screening mammogram on 11/11/2008, which showed an abnormality.  A follow up diagnostic mammogram and right breast ultrasound was performed.  This showed a hyperdense spiculated mass 12o'clock left breast measuring 5 x 3 x 1.5 cm.  There is an associated abnormality seen on ultrasound as well.  Biopsy was recommended with BSGI scan performed 11/23/2008 showed an uptake with a 3 cm of intense isotope activity.  An ultrasound-guided biopsy of both the breast mass in the left breast as well as left axilla was performed on 11/23/2008, both of which show invasive mammary carcinoma.  Prognostic panel showed this to be ER positive at 99%, PR positive at 7%, proliferative index 17%, HER-2 was negative with ratio of 1.2.  An E-cadherin test was performed, which was negative suggestive of lobular phenotype.  The patient has subsequently been referred for a neoadjuvant therapy.  Of note is she had an MRI scan on 12/01/2008, which showed a 4.9 x 3.5 x 2.9 cm area of lobular carcinoma 12 o'clock position left breast.  Axillary lymph nodes were also seen measuring 1.6 x 1.1 cm.  Multiple right breast cysts were also seen as well."   The patient's subsequent history is as detailed below.   PAST MEDICAL HISTORY: Past Medical History:  Diagnosis Date  . Benign breast cyst in female    Right breast  . Cancer of left breast (Leslie [name]) 2010  . Depression   . History of blood transfusion    "related to tubal pregnancy"  . Hypertension   . PAD (peripheral artery disease) ([place])     PAST SURGICAL HISTORY: Past Surgical  History:  Procedure Laterality Date  . ABDOMINAL HYSTERECTOMY  1980s  . BREAST CYST EXCISION Right ~ 2005  . BREAST LUMPECTOMY Left 11/23/2008  . ECTOPIC PREGNANCY SURGERY  1966  . LOWER EXTREMITY ANGIOGRAM  06/19/2017  . LOWER EXTREMITY ANGIOGRAPHY N/A 06/19/2017   Procedure: LOWER EXTREMITY ANGIOGRAPHY;  Surgeon: Runell Gess, MD;   Location: [facility] INVASIVE CV LAB;  Service: Cardiovascular;  Laterality: N/A;  . PERIPHERAL VASCULAR BALLOON ANGIOPLASTY Left 06/19/2017   SFA   . PERIPHERAL VASCULAR BALLOON ANGIOPLASTY  06/19/2017   Procedure: PERIPHERAL VASCULAR BALLOON ANGIOPLASTY;  Surgeon: Runell Gess, MD;  Location: [facility] INVASIVE CV LAB;  Service: Cardiovascular;;  SFA left  hysterectomy in her 30s secondary to tubal pregnancy, ovaries were intact   FAMILY HISTORY: Family History  Problem Relation Age of Onset  . Hypertension Mother   . Seizures Mother   . Heart disease Father   . Hypertension Sister    No history of breast or ovarian cancer in the family.  Father had a history of bad heart; mother died at age 49.    The patient has five sisters, two brothers, all of whom in good health.   GYNECOLOGIC HISTORY:  No LMP recorded. Patient has had a hysterectomy. Menarche: 74 years old Age at first live birth:    years old GX P 3 HRT yes  Hysterectomy? yes BSO? no   SOCIAL HISTORY: (updated 10/2019)  Leslie Drape "Leslie [name]" retired from working as a Warehouse manager for [employer]. She is widowed. Her husband of 45 years had a history of lung cancer. She lives by herself with no pets.  She has three grown sons that live in [place], [place], and [place].  The patient has 1 granddaughter, age 83, who is attending college studying to be an Art gallery manager.  The patient attends a [employer]    ADVANCED DIRECTIVES: not in place; the patient plans to name her son Leslie [name], who lives in [place] (and works in [employer], hospital) as her healthcare power of attorney.  He can be reached at [phone]   HEALTH MAINTENANCE: Social History   Tobacco Use  . Smoking status: Current Every Day Smoker    Packs/day: 0.50    Years: 45.00    Pack years: 22.50  . Smokeless tobacco: Never Used  Vaping Use  . Vaping Use: Never used  Substance Use Topics  . Alcohol use: Yes    Comment: 06/19/2017  "glass of wine 3-4 times/year"  . Drug use: Never     Colonoscopy: none on file  PAP: 2012?  Bone density:    Allergies  Allergen Reactions  . Aspirin Nausea Only  . Zyrtec [Cetirizine] Other (See Comments)    Chest Pain    Current Outpatient Medications  Medication Sig Dispense Refill  . acetaminophen (TYLENOL) 500 MG tablet Take 500-1,000 mg by mouth every 6 (six) hours as needed for moderate pain or headache.    . anastrozole (ARIMIDEX) 1 MG tablet Take 1 tablet (1 mg total) by mouth daily. 90 tablet 4  . atenolol-chlorthalidone (TENORETIC) 50-25 MG per tablet Take 0.5 tablets by mouth daily.     . cholecalciferol (VITAMIN D) 400 units TABS tablet Take 400 Units by mouth daily.    . Evening Primrose Oil 1000 MG CAPS Take 1,000 mg by mouth daily.    . fluocinonide cream (LIDEX) 0.05 % Apply 1 application topically 2 (two) times daily as needed (for break outs).    . folic  acid (FOLVITE) 400 MCG tablet Take 400 mcg by mouth daily.     Marland Kitchen gabapentin (NEURONTIN) 300 MG capsule     . methocarbamol (ROBAXIN) 500 MG tablet Take 500 mg by mouth every 6 (six) hours as needed.    . Multiple Vitamin (MULTIVITAMIN PO) Take 1 tablet by mouth daily.     . palbociclib (IBRANCE) 125 MG tablet Take 1 tablet (125 mg total) by mouth daily. Take for 21 days on, 7 days off, repeat every 28 days. Start 12/01/19 21 tablet 6  . traMADol (ULTRAM) 50 MG tablet Take 50 mg by mouth every 6 (six) hours as needed.    . triamcinolone cream (KENALOG) 0.1 % Apply topically.    . vitamin E 400 UNIT capsule Take 400 Units by mouth daily.     No current facility-administered medications for this visit.    OBJECTIVE: African-American woman in no acute distress  Vitals:   12/01/19 1007  BP: 125/70  Pulse: 75  Resp: 19  Temp: 98.1 F (36.7 C)  SpO2: 99%     Body mass index is 20.07 kg/m.   Wt Readings from Last 3 Encounters:  12/01/19 116 lb 14.4 oz (53 kg)  11/05/19 117 lb 9.6 oz (53.3 kg)  10/26/19  116 lb (52.6 kg)      ECOG FS:1 - Symptomatic but completely ambulatory  Ocular: Sclerae unicteric, pupils round and equal Ear-nose-throat: Wearing a mask Lymphatic: No cervical or supraclavicular adenopathy Lungs no rales or rhonchi Heart regular rate and rhythm Abd soft, nontender, positive bowel sounds MSK no focal spinal tenderness, no joint edema Neuro: non-focal, well-oriented, appropriate affect Breasts: The right breast is benign per the left breast is status post lumpectomy and radiation.  There is no evidence of local recurrence.  Both axillae are benign.   LAB RESULTS:  CMP     Component Value Date/Time   NA 139 11/19/2019 0929   NA 145 (H) 06/04/2017 1513   NA 141 02/09/2013 1056   K 3.3 (L) 11/19/2019 0929   K 3.4 (L) 02/09/2013 1056   CL 100 11/19/2019 0929   CL 102 06/03/2012 1106   CO2 29 11/19/2019 0929   CO2 27 02/09/2013 1056   GLUCOSE 99 11/19/2019 0929   GLUCOSE 134 02/09/2013 1056   GLUCOSE 101 (H) 06/03/2012 1106   BUN 9 11/19/2019 0929   BUN 11 06/04/2017 1513   BUN 11.0 02/09/2013 1056   CREATININE 0.77 11/19/2019 0929   CREATININE 0.9 02/09/2013 1056   CALCIUM 9.6 11/19/2019 0929   CALCIUM 9.8 02/09/2013 1056   PROT 7.9 11/19/2019 0929   PROT 7.2 10/06/2018 1005   PROT 7.4 02/09/2013 1056   ALBUMIN 3.7 11/19/2019 0929   ALBUMIN 4.4 10/06/2018 1005   ALBUMIN 4.0 02/09/2013 1056   AST 20 11/19/2019 0929   AST 19 02/09/2013 1056   ALT 25 11/19/2019 0929   ALT 20 02/09/2013 1056   ALKPHOS 98 11/19/2019 0929   ALKPHOS 68 02/09/2013 1056   BILITOT 0.4 11/19/2019 0929   BILITOT 0.49 02/09/2013 1056   GFRNONAA >60 11/19/2019 0929   GFRAA >60 06/20/2017 0249    Lab Results  Component Value Date   TOTALPROTELP 7.5 11/03/2019   ALBUMINELP 4.0 11/03/2019   A1GS 0.2 11/03/2019   A2GS 0.8 11/03/2019   BETS 1.1 11/03/2019   GAMS 1.4 11/03/2019   MSPIKE Not Observed 11/03/2019   SPEI Comment 11/03/2019    Lab Results  Component Value  Date   WBC  7.4 12/01/2019   NEUTROABS 3.9 12/01/2019   HGB 12.6 12/01/2019   HCT 38.4 12/01/2019   MCV 94.3 12/01/2019   PLT 355 12/01/2019    Lab Results  Component Value Date   LABCA2 33 12/05/2011    No components found for: [id number]  No results for input(s): INR in the last 168 hours.  Lab Results  Component Value Date   LABCA2 33 12/05/2011    No results found for: CAN199  No results found for: CAN125  No results found for: MCN470  Lab Results  Component Value Date   CA2729 41.3 (H) 11/03/2019    No components found for: HGQUANT  Lab Results  Component Value Date   CEA1 2.33 11/03/2019   /  CEA ([facility]In [facility])  Date Value Ref Range Status  11/03/2019 2.33 0.00 - 5.00 ng/mL Final    Comment:    (NOTE) This test was performed using Architect's Chemiluminescent Microparticle Immunoassay. Values obtained from different assay methods cannot be used interchangeably. Please note that 5-10% of patients who smoke may see CEA levels up to 6.9 ng/mL. Performed at [facility], [place] [address] [place], [place] [address]      No results found for: AFPTUMOR  No results found for: [facility]  Lab Results  Component Value Date   KPAFRELGTCHN 25.8 (H) 11/03/2019   LAMBDASER 17.7 11/03/2019   KAPLAMBRATIO 1.46 11/03/2019   (kappa/lambda light chains)  No results found for: HGBA, HGBA2QUANT, HGBFQUANT, HGBSQUAN (Hemoglobinopathy evaluation)   Lab Results  Component Value Date   LDH 182 06/03/2012    No results found for: IRON, TIBC, IRONPCTSAT (Iron and TIBC)  No results found for: FERRITIN  Urinalysis    Component Value Date/Time   COLORURINE YELLOW 08/17/2010 1105   APPEARANCEUR CLEAR 08/17/2010 1105   LABSPEC 1.017 08/17/2010 1105   PHURINE 8.0 08/17/2010 1105   GLUCOSEU NEGATIVE 08/17/2010 1105   HGBUR NEGATIVE 08/17/2010 1105   BILIRUBINUR NEGATIVE 08/17/2010 1105   KETONESUR NEGATIVE 08/17/2010 1105    PROTEINUR NEGATIVE 08/17/2010 1105   UROBILINOGEN 0.2 08/17/2010 1105   NITRITE NEGATIVE 08/17/2010 1105   LEUKOCYTESUR NEGATIVE 08/17/2010 1105     STUDIES: CT CHEST W CONTRAST  Result Date: 11/05/2019 CLINICAL DATA:  Metastatic breast cancer with osseous lesions on lumbar MRI. EXAM: CT CHEST WITH CONTRAST TECHNIQUE: Multidetector CT imaging of the chest was performed during intravenous contrast administration. CONTRAST:  55mL OMNIPAQUE IOHEXOL 300 MG/ML  SOLN COMPARISON:  Lumbar MRI 10/04/2019.  Chest CT 12/21/2008. FINDINGS: Cardiovascular: Mild aortic and coronary artery atherosclerosis. No acute vascular findings. The heart size is normal. There is no pericardial effusion. Mediastinum/Nodes: There are no enlarged mediastinal, hilar, axillary or internal mammary lymph nodes. Postsurgical changes are present within the left axilla and left breast. The thyroid gland, trachea and esophagus demonstrate no significant findings. Lungs/Pleura: There is no pleural effusion. Mild centrilobular emphysema with mild cylindrical bronchiectasis at both lung bases. There is subpleural scarring anteriorly in the left upper lobe attributed to prior radiation therapy. There is linear atelectasis or scarring in the left lower lobe. No suspicious pulmonary nodules. 4 mm perifissural nodule along the superior aspect of the left major fissure on image 60/4 is unchanged. Upper abdomen: The visualized upper abdomen appears unchanged, without suspicious findings. Musculoskeletal/Chest wall: As demonstrated on recent lumbar MRI, there is widespread lytic and blastic metastatic disease throughout the visualized thoracolumbar spine, ribs and sternum. There is a Schmorl's node involving the superior endplate of T9 which  could be pathologic. Right lateral soft tissue density at T10-11 likely represents a lateral disc extrusion. No evidence of epidural tumor or canal compromise. IMPRESSION: 1. Widespread lytic and blastic  metastatic disease throughout the visualized thoracolumbar spine, ribs and sternum, similar to recent lumbar MRI. 2. No evidence of extra osseous metastatic disease. 3. Probable right lateral disc extrusion at T10-11 without evidence of epidural tumor or canal compromise. 4. Aortic Atherosclerosis (ICD10-I70.0) and Emphysema (ICD10-J43.9). Electronically Signed   By: Carey Bullocks M.D.   On: 11/05/2019 09:32   NM Bone Scan Whole Body  Result Date: 11/05/2019 CLINICAL DATA:  History of breast cancer.  Right leg pain. EXAM: NUCLEAR MEDICINE WHOLE BODY BONE SCAN TECHNIQUE: Whole body anterior and posterior images were obtained approximately 3 hours after intravenous injection of radiopharmaceutical. RADIOPHARMACEUTICALS:  22.0 mCi Technetium-30m MDP IV COMPARISON:  December 21, 2008. FINDINGS: Multiple areas of abnormal uptake are seen involving the ribs bilaterally as well as the thoracic and lumbar spine consistent with metastatic disease. Abnormal uptake is seen involving the right acetabulum and proximal right femur as well as the pubic rami bilaterally on both sides of the pubic symphysis. Abnormal uptake is noted in the right shoulder which is asymmetric compared to the other side, concerning for metastatic disease. IMPRESSION: Multiple foci of osseous metastatic disease is seen involving the ribs, spine, pelvis and proximal right femur. Electronically Signed   By: Lupita Raider M.D.   On: 11/05/2019 12:39     ELIGIBLE FOR AVAILABLE RESEARCH PROTOCOL: no  ASSESSMENT: 74 y.o. [place] woman  1.  Status post left breast upper outer quadrant biopsy and left axillary needle core biopsy on 11/23/2008 for clinical T3 N1, stage IIA invasive lobular carcinoma, E-cadherin negative, estrogen and progesterone receptor positive, HER-2 not amplified, with an MIB-1 of 17% Ki-67 17%  2.    On neoadjuvant letrozole started in 12/2008, interrupted at the time of her definitive surgery, then resumed  post-op  4.  Status post left breast lumpectomy with regional lymph node resection 07/12/2009 for a stage IIA, pT1c pN1, invasive lobular carcinoma,with angiolymphatic invasion identified  (a) 2 of 8 removed left nodes were involved   (b) repeat prognostic panel again estrogen receptor positive, not progesterone receptor negative, with an MIB-1 of 74% and HER-2 not amplified   (c) additional surgery for margin clearance 08/04/2009 showed only atypical lobular hyperplasia  5. Oncotype score of 20 predicts a 5-year rate of recurrence outside the breast with antiestrogens as the only systemic therapy of 13%.  It also predicts no significant benefit from chemotherapy.   6.  status post radiation therapy 09/26/2009-11/11/18  7.  letrozole continued through December 2015 (5 years)  METASTATIC DISEASE: 8.  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  zoledronate started 11/10/2019, repeat every 12 weeks  10.  anastrozole started 11/05/2019  (a) palbociclib 125 mg daily, 21/7, to start 12/02/2019   PLAN: I discussed will the situation with her again in detail.  I gave her a copy of her bone marrow biopsy and interpreted it to her so she can see that this is breast cancer, still  estrogen receptor positive.  She understands that she has a stage IV breast cancer and that this is not curable.  The medication she is starting, the combination of anastrozole and palbociclib, frequently controls the cancer for period of time which could be years, usually with very good tolerance.  If and when the tumor grows  despite this we would switch to a different treatment.  We reviewed the possible toxicities side effects and complications of palbociclib in detail.  She will start that tomorrow--it is being overnighted to her today.  She knows that she will take it for 21 days and then will be off for 7 days.  I will see her before she starts the second cycle.  She tells me that she does not want to take the gabapentin anymore so we are stopping that medication.  I encouraged her to continue to be as normal as possible in her life and work but to let us know if any new symptoms develop.  We will obtain a new baseline bone scan sometime in January or February  Total encounter time 35 minutes.Sarajane Jews C. Mikya Don, MD 12/01/2019 10:10 AM Medical Oncology and Hematology [facility] [place], [place] [address] Tel. [phone]    Fax. [phone]   This document serves as a record of services personally performed by Lurline Del, MD. It was created on his behalf by Wilburn Mylar, a trained medical scribe. The creation of this record is based on the scribe's personal observations and the provider's statements to them.   I, Lurline Del MD, have reviewed the above documentation for accuracy and completeness, and I agree with the above.   *Total Encounter Time as defined by the Centers for Medicare and Medicaid Services includes, in addition to the face-to-face time of a patient visit (documented in the note above) non-face-to-face time: obtaining and reviewing outside history, ordering and reviewing medications, tests or procedures, care coordination (communications with other health care professionals or caregivers) and documentation in the medical record.

## 2019-12-01 ENCOUNTER — Other Ambulatory Visit: Payer: Self-pay

## 2019-12-01 ENCOUNTER — Inpatient Hospital Stay: Payer: Medicare Other

## 2019-12-01 ENCOUNTER — Inpatient Hospital Stay: Payer: Medicare Other | Attending: Oncology | Admitting: Oncology

## 2019-12-01 VITALS — BP 125/70 | HR 75 | Temp 98.1°F | Resp 19 | Ht 64.0 in | Wt 116.9 lb

## 2019-12-01 DIAGNOSIS — Z170 Estrogen receptor positive status [ER+]: Secondary | ICD-10-CM | POA: Diagnosis not present

## 2019-12-01 DIAGNOSIS — Z79811 Long term (current) use of aromatase inhibitors: Secondary | ICD-10-CM | POA: Insufficient documentation

## 2019-12-01 DIAGNOSIS — C7951 Secondary malignant neoplasm of bone: Secondary | ICD-10-CM | POA: Diagnosis not present

## 2019-12-01 DIAGNOSIS — F17210 Nicotine dependence, cigarettes, uncomplicated: Secondary | ICD-10-CM | POA: Insufficient documentation

## 2019-12-01 DIAGNOSIS — I10 Essential (primary) hypertension: Secondary | ICD-10-CM

## 2019-12-01 DIAGNOSIS — C50412 Malignant neoplasm of upper-outer quadrant of left female breast: Secondary | ICD-10-CM | POA: Diagnosis not present

## 2019-12-01 DIAGNOSIS — I739 Peripheral vascular disease, unspecified: Secondary | ICD-10-CM

## 2019-12-01 DIAGNOSIS — Z923 Personal history of irradiation: Secondary | ICD-10-CM | POA: Insufficient documentation

## 2019-12-01 LAB — COMPREHENSIVE METABOLIC PANEL
ALT: 20 U/L (ref 0–44)
AST: 21 U/L (ref 15–41)
Albumin: 3.5 g/dL (ref 3.5–5.0)
Alkaline Phosphatase: 106 U/L (ref 38–126)
Anion gap: 9 (ref 5–15)
BUN: 9 mg/dL (ref 8–23)
CO2: 30 mmol/L (ref 22–32)
Calcium: 9.1 mg/dL (ref 8.9–10.3)
Chloride: 102 mmol/L (ref 98–111)
Creatinine, Ser: 0.82 mg/dL (ref 0.44–1.00)
GFR, Estimated: >60 mL/min (ref >60)
Glucose, Bld: 104 mg/dL — ABNORMAL HIGH (ref 70–99)
Potassium: 3.5 mmol/L (ref 3.5–5.1)
Sodium: 141 mmol/L (ref 135–145)
Total Bilirubin: 0.4 mg/dL (ref 0.3–1.2)
Total Protein: 7.5 g/dL (ref 6.5–8.1)

## 2019-12-01 LAB — CBC WITH DIFFERENTIAL/PLATELET
Abs Immature Granulocytes: 0.04 10*3/uL (ref 0.00–0.07)
Basophils Absolute: 0.1 10*3/uL (ref 0.0–0.1)
Basophils Relative: 1 %
Eosinophils Absolute: 0.1 10*3/uL (ref 0.0–0.5)
Eosinophils Relative: 2 %
HCT: 38.4 % (ref 36.0–46.0)
Hemoglobin: 12.6 g/dL (ref 12.0–15.0)
Immature Granulocytes: 1 %
Lymphocytes Relative: 36 %
Lymphs Abs: 2.6 10*3/uL (ref 0.7–4.0)
MCH: 31 pg (ref 26.0–34.0)
MCHC: 32.8 g/dL (ref 30.0–36.0)
MCV: 94.3 fL (ref 80.0–100.0)
Monocytes Absolute: 0.7 10*3/uL (ref 0.1–1.0)
Monocytes Relative: 9 %
Neutro Abs: 3.9 10*3/uL (ref 1.7–7.7)
Neutrophils Relative %: 51 %
Platelets: 355 10*3/uL (ref 150–400)
RBC: 4.07 MIL/uL (ref 3.87–5.11)
RDW: 13 % (ref 11.5–15.5)
WBC: 7.4 10*3/uL (ref 4.0–10.5)
nRBC: 0 % (ref 0.0–0.2)

## 2019-12-07 ENCOUNTER — Telehealth: Payer: Self-pay | Admitting: Oncology

## 2019-12-07 NOTE — Telephone Encounter (Signed)
Scheduled per 11/10 los. Called and spoke with pt, confirmed 12/9 appts

## 2019-12-13 ENCOUNTER — Telehealth: Payer: Self-pay

## 2019-12-28 NOTE — Telephone Encounter (Signed)
Oral Oncology Patient Advocate Encounter  Met patient in [place] to complete re-enrollment application for [facility] in an effort to reduce patient's out of pocket expense for [facility] to $0.    Application completed and faxed to [phone].   [employer] patient assistance phone number for follow up is [phone].   This encounter will be updated until final determination.   [place] [facility] [place] Phone [phone] Fax [phone] 12/28/2019 3:21 PM

## 2019-12-30 ENCOUNTER — Inpatient Hospital Stay: Payer: Medicare Other

## 2019-12-30 ENCOUNTER — Other Ambulatory Visit: Payer: Self-pay

## 2019-12-30 ENCOUNTER — Inpatient Hospital Stay: Payer: Medicare Other | Attending: Oncology | Admitting: Oncology

## 2019-12-30 VITALS — BP 125/77 | HR 75 | Temp 97.6°F | Resp 18 | Ht 64.0 in | Wt 117.5 lb

## 2019-12-30 DIAGNOSIS — Z170 Estrogen receptor positive status [ER+]: Secondary | ICD-10-CM

## 2019-12-30 DIAGNOSIS — Z923 Personal history of irradiation: Secondary | ICD-10-CM | POA: Insufficient documentation

## 2019-12-30 DIAGNOSIS — C50412 Malignant neoplasm of upper-outer quadrant of left female breast: Secondary | ICD-10-CM | POA: Diagnosis not present

## 2019-12-30 DIAGNOSIS — E782 Mixed hyperlipidemia: Secondary | ICD-10-CM

## 2019-12-30 DIAGNOSIS — F17210 Nicotine dependence, cigarettes, uncomplicated: Secondary | ICD-10-CM | POA: Insufficient documentation

## 2019-12-30 DIAGNOSIS — Z79811 Long term (current) use of aromatase inhibitors: Secondary | ICD-10-CM | POA: Insufficient documentation

## 2019-12-30 DIAGNOSIS — C7951 Secondary malignant neoplasm of bone: Secondary | ICD-10-CM | POA: Diagnosis not present

## 2019-12-30 DIAGNOSIS — I10 Essential (primary) hypertension: Secondary | ICD-10-CM

## 2019-12-30 DIAGNOSIS — Z720 Tobacco use: Secondary | ICD-10-CM | POA: Diagnosis not present

## 2019-12-30 DIAGNOSIS — I739 Peripheral vascular disease, unspecified: Secondary | ICD-10-CM

## 2019-12-30 LAB — CBC WITH DIFFERENTIAL/PLATELET
Abs Immature Granulocytes: 0.01 10*3/uL (ref 0.00–0.07)
Basophils Absolute: 0.1 10*3/uL (ref 0.0–0.1)
Basophils Relative: 1 %
Eosinophils Absolute: 0 10*3/uL (ref 0.0–0.5)
Eosinophils Relative: 1 %
HCT: 34.7 % — ABNORMAL LOW (ref 36.0–46.0)
Hemoglobin: 11.6 g/dL — ABNORMAL LOW (ref 12.0–15.0)
Immature Granulocytes: 0 %
Lymphocytes Relative: 54 %
Lymphs Abs: 2.5 10*3/uL (ref 0.7–4.0)
MCH: 32.2 pg (ref 26.0–34.0)
MCHC: 33.4 g/dL (ref 30.0–36.0)
MCV: 96.4 fL (ref 80.0–100.0)
Monocytes Absolute: 0.6 10*3/uL (ref 0.1–1.0)
Monocytes Relative: 12 %
Neutro Abs: 1.5 10*3/uL — ABNORMAL LOW (ref 1.7–7.7)
Neutrophils Relative %: 32 %
Platelets: 255 10*3/uL (ref 150–400)
RBC: 3.6 MIL/uL — ABNORMAL LOW (ref 3.87–5.11)
RDW: 15.9 % — ABNORMAL HIGH (ref 11.5–15.5)
WBC: 4.7 10*3/uL (ref 4.0–10.5)
nRBC: 0 % (ref 0.0–0.2)

## 2019-12-30 LAB — COMPREHENSIVE METABOLIC PANEL
ALT: 16 U/L (ref 0–44)
AST: 19 U/L (ref 15–41)
Albumin: 3.7 g/dL (ref 3.5–5.0)
Alkaline Phosphatase: 78 U/L (ref 38–126)
Anion gap: 9 (ref 5–15)
BUN: 9 mg/dL (ref 8–23)
CO2: 27 mmol/L (ref 22–32)
Calcium: 9.2 mg/dL (ref 8.9–10.3)
Chloride: 104 mmol/L (ref 98–111)
Creatinine, Ser: 0.86 mg/dL (ref 0.44–1.00)
GFR, Estimated: >60 mL/min (ref >60)
Glucose, Bld: 106 mg/dL — ABNORMAL HIGH (ref 70–99)
Potassium: 3.6 mmol/L (ref 3.5–5.1)
Sodium: 140 mmol/L (ref 135–145)
Total Bilirubin: 0.4 mg/dL (ref 0.3–1.2)
Total Protein: 7.5 g/dL (ref 6.5–8.1)

## 2019-12-30 MED ORDER — GABAPENTIN 300 MG PO CAPS
300.0000 mg | ORAL_CAPSULE | Freq: Every day | ORAL | 4 refills | Status: DC
Start: 2019-12-30 — End: 2021-02-19

## 2019-12-30 NOTE — Progress Notes (Signed)
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   She continues on anastrozole.  She has some hot flashes and vaginal dryness but these are not major concerns to her.  She was also started on zolendronate on 11/09/2019.  She had no side effects from this dose that she is aware of  She started palbociclib on 12/02/2019.  She says this takes her appetite away.  However she has not lost any weight.   REVIEW OF SYSTEMS: Leslie [name] denies unusual headaches visual changes cough phlegm production pleurisy shortness of breath or change in bowel or bladder habits.  She has a runny nose which is occasionally irritating.  She has been taking Benadryl for this but it makes her sleepy.  A detailed review of systems was otherwise stable   COVID 19 VACCINATION STATUS: Status post Pfizer x2+ booster November 2021   HISTORY OF CURRENT ILLNESS: From [name] he patient evaluation note dated 12/07/2008:  "This is a pleasant 74 year old woman from [facility] referred by [facility] for evaluation and treatment of breast cancer. This woman has been in relatively good health.  She undergoes annual screening mammography.  Her husband is a patient of Dr. [name] who has lung cancer.  She had a screening mammogram on 11/11/2008, which showed an abnormality.  A follow up diagnostic  mammogram and right breast ultrasound was performed.  This showed a hyperdense spiculated mass 12o'clock left breast measuring 5 x 3 x 1.5 cm.  There is an associated abnormality seen on ultrasound as well.  Biopsy was recommended with BSGI scan performed 11/23/2008 showed an uptake with a 3 cm of intense isotope activity.  An ultrasound-guided biopsy of both the breast mass in the left breast as well as left axilla was performed on 11/23/2008, both of which show invasive mammary carcinoma.  Prognostic panel showed this to be ER positive at 99%, PR positive at 7%, proliferative index 17%, HER-2 was negative with ratio of 1.2.  An E-cadherin test was performed, which was negative suggestive of lobular phenotype.  The patient has subsequently been referred for a neoadjuvant therapy.  Of note is she had an MRI scan on 12/01/2008, which showed a 4.9 x 3.5 x 2.9 cm area of lobular carcinoma 12 o'clock position left breast.  Axillary lymph nodes were also seen measuring 1.6 x 1.1 cm.  Multiple right breast cysts were also seen as well."   The patient's subsequent history is as detailed below.   PAST MEDICAL HISTORY: Past Medical History:  Diagnosis Date  . Benign breast cyst in female    Right breast  . Cancer of left breast ([place]) 2010  . Depression   . History of blood transfusion    "related to tubal pregnancy"  . Hypertension   . PAD (peripheral artery disease) ([place])     PAST SURGICAL HISTORY: Past Surgical History:  Procedure Laterality Date  . ABDOMINAL HYSTERECTOMY  1980s  . BREAST CYST EXCISION  Right ~ 2005  . BREAST LUMPECTOMY Left 11/23/2008  . [place]  . LOWER EXTREMITY ANGIOGRAM  06/19/2017  . LOWER EXTREMITY ANGIOGRAPHY N/A 06/19/2017   Procedure: LOWER EXTREMITY ANGIOGRAPHY;  Surgeon: Lorretta Harp, MD;  Location: [place] [facility];  Service: Cardiovascular;  Laterality: N/A;  . PERIPHERAL VASCULAR BALLOON ANGIOPLASTY Left 06/19/2017   SFA   .  PERIPHERAL VASCULAR BALLOON ANGIOPLASTY  06/19/2017   Procedure: PERIPHERAL VASCULAR BALLOON ANGIOPLASTY;  Surgeon: Lorretta Harp, MD;  Location: [facility] [facility];  Service: Cardiovascular;;  SFA left  hysterectomy in her 80s secondary to tubal pregnancy, ovaries were intact   FAMILY HISTORY: Family History  Problem Relation Age of Onset  . Hypertension Mother   . Seizures Mother   . Heart disease Father   . Hypertension Sister    No history of breast or ovarian cancer in the family.  Father had a history of bad heart; mother died at age 18.    The patient has five sisters, two brothers, all of whom in good health.   GYNECOLOGIC HISTORY:  No LMP recorded. Patient has had a hysterectomy. Menarche: 74 years old Age at first live birth:    years old [place] 3 HRT yes  Hysterectomy? yes BSO? no   SOCIAL HISTORY: (updated 10/2019)  Leslie Fountain "Mckinzi [name]" retired from working as a Engineer, mining for [employer]. She is widowed. Her husband of 8 years had a history of lung cancer. She lives by herself with no pets.  She has three grown sons that live in [place], [place], and [place].  The patient has 1 granddaughter, age 53, who is attending college studying to be an Chief Financial Officer.  The patient attends a [employer]    ADVANCED DIRECTIVES: not in place; the patient plans to name her son Teana Lindahl, who lives in [place] (and works in Pharmacist, hospital) as her healthcare power of attorney.  He can be reached at [phone]   HEALTH MAINTENANCE: Social History   Tobacco Use  . Smoking status: Current Every Day Smoker    Packs/day: 0.50    Years: 45.00    Pack years: 22.50  . Smokeless tobacco: Never Used  Vaping Use  . Vaping Use: Never used  Substance Use Topics  . Alcohol use: Yes    Comment: 06/19/2017 "glass of wine 3-4 times/year"  . Drug use: Never     Colonoscopy: none on file  PAP: 2012?  Bone density:    Allergies  Allergen Reactions   . Aspirin Nausea Only  . Zyrtec [Cetirizine] Other (See Comments)    Chest Pain    Current Outpatient Medications  Medication Sig Dispense Refill  . acetaminophen (TYLENOL) 500 MG tablet Take 500-1,000 mg by mouth every 6 (six) hours as needed for moderate pain or headache.    . anastrozole (ARIMIDEX) 1 MG tablet Take 1 tablet (1 mg total) by mouth daily. 90 tablet 4  . atenolol-chlorthalidone (TENORETIC) 50-25 MG per tablet Take 0.5 tablets by mouth daily.     . cholecalciferol (VITAMIN D) 400 units TABS tablet Take 400 Units by mouth daily.    . Evening Primrose Oil 1000 MG CAPS Take 1,000 mg by mouth daily.    . fluocinonide cream (LIDEX) 2.99 % Apply 1 application topically 2 (two) times daily as needed (for break outs).    . folic acid (FOLVITE) 371 MCG tablet Take 400 mcg by mouth daily.     Marland Kitchen  gabapentin (NEURONTIN) 300 MG capsule Take 1 capsule (300 mg total) by mouth at bedtime. 90 capsule 4  . Multiple Vitamin (MULTIVITAMIN PO) Take 1 tablet by mouth daily.     . palbociclib (IBRANCE) 125 MG tablet Take 1 tablet (125 mg total) by mouth daily. Take for 21 days on, 7 days off, repeat every 28 days. Start 12/01/19 21 tablet 6  . triamcinolone cream (KENALOG) 0.1 % Apply topically.    . vitamin E 400 UNIT capsule Take 400 Units by mouth daily.     No current facility-administered medications for this visit.    OBJECTIVE: African-American woman who appears well  Vitals:   12/30/19 1222  BP: 125/77  Pulse: 75  Resp: 18  Temp: 97.6 F (36.4 C)  SpO2: 100%     Body mass index is 20.17 kg/m.   Wt Readings from Last 3 Encounters:  12/30/19 117 lb 8 oz (53.3 kg)  12/01/19 116 lb 14.4 oz (53 kg)  11/05/19 117 lb 9.6 oz (53.3 kg)      ECOG FS:1 - Symptomatic but completely ambulatory  Sclerae unicteric, EOMs intact Wearing a mask No cervical or supraclavicular adenopathy Lungs no rales or rhonchi Heart regular rate and rhythm Abd soft, nontender, positive bowel  sounds MSK no focal spinal tenderness, no upper extremity lymphedema Neuro: nonfocal, well oriented, appropriate affect Breasts: The right breast is unremarkable.  The left breast has undergone lumpectomy followed by radiation.  There is no evidence of local recurrence.  Both axillae are benign.   LAB RESULTS:  CMP     Component Value Date/Time   NA 141 12/01/2019 0950   NA 145 (H) 06/04/2017 1513   NA 141 02/09/2013 1056   K 3.5 12/01/2019 0950   K 3.4 (L) 02/09/2013 1056   CL 102 12/01/2019 0950   CL 102 06/03/2012 1106   CO2 30 12/01/2019 0950   CO2 27 02/09/2013 1056   GLUCOSE 104 (H) 12/01/2019 0950   GLUCOSE 134 02/09/2013 1056   GLUCOSE 101 (H) 06/03/2012 1106   BUN 9 12/01/2019 0950   BUN 11 06/04/2017 1513   BUN 11.0 02/09/2013 1056   CREATININE 0.82 12/01/2019 0950   CREATININE 0.77 11/19/2019 0929   CREATININE 0.9 02/09/2013 1056   CALCIUM 9.1 12/01/2019 0950   CALCIUM 9.8 02/09/2013 1056   PROT 7.5 12/01/2019 0950   PROT 7.2 10/06/2018 1005   PROT 7.4 02/09/2013 1056   ALBUMIN 3.5 12/01/2019 0950   ALBUMIN 4.4 10/06/2018 1005   ALBUMIN 4.0 02/09/2013 1056   AST 21 12/01/2019 0950   AST 20 11/19/2019 0929   AST 19 02/09/2013 1056   ALT 20 12/01/2019 0950   ALT 25 11/19/2019 0929   ALT 20 02/09/2013 1056   ALKPHOS 106 12/01/2019 0950   ALKPHOS 68 02/09/2013 1056   BILITOT 0.4 12/01/2019 0950   BILITOT 0.4 11/19/2019 0929   BILITOT 0.49 02/09/2013 1056   GFRNONAA >60 12/01/2019 0950   GFRNONAA >60 11/19/2019 0929   GFRAA >60 06/20/2017 0249    Lab Results  Component Value Date   TOTALPROTELP 7.5 11/03/2019   ALBUMINELP 4.0 11/03/2019   A1GS 0.2 11/03/2019   A2GS 0.8 11/03/2019   BETS 1.1 11/03/2019   GAMS 1.4 11/03/2019   MSPIKE Not Observed 11/03/2019   SPEI Comment 11/03/2019    Lab Results  Component Value Date   WBC 4.7 12/30/2019   NEUTROABS 1.5 (L) 12/30/2019   HGB 11.6 (L) 12/30/2019   HCT 34.7 (L)  12/30/2019   MCV 96.4 12/30/2019    PLT 255 12/30/2019    Lab Results  Component Value Date   LABCA2 33 12/05/2011    No components found for: [id number]  No results for input(s): INR in the last 168 hours.  Lab Results  Component Value Date   LABCA2 33 12/05/2011    No results found for: CAN199  No results found for: CAN125  No results found for: XHB716  Lab Results  Component Value Date   CA2729 41.3 (H) 11/03/2019    No components found for: HGQUANT  Lab Results  Component Value Date   CEA1 2.33 11/03/2019   /  CEA ([facility]In [facility])  Date Value Ref Range Status  11/03/2019 2.33 0.00 - 5.00 ng/mL Final    Comment:    (NOTE) This test was performed using Architect's Chemiluminescent Microparticle Immunoassay. Values obtained from different assay methods cannot be used interchangeably. Please note that 5-10% of patients who smoke may see CEA levels up to 6.9 ng/mL. Performed at [facility], [place] [address], [place], [place] [address]      No results found for: AFPTUMOR  No results found for: [facility]  Lab Results  Component Value Date   KPAFRELGTCHN 25.8 (H) 11/03/2019   LAMBDASER 17.7 11/03/2019   KAPLAMBRATIO 1.46 11/03/2019   (kappa/lambda light chains)  No results found for: HGBA, HGBA2QUANT, HGBFQUANT, HGBSQUAN (Hemoglobinopathy evaluation)   Lab Results  Component Value Date   LDH 182 06/03/2012    No results found for: IRON, TIBC, IRONPCTSAT (Iron and TIBC)  No results found for: FERRITIN  Urinalysis    Component Value Date/Time   COLORURINE YELLOW 08/17/2010 [place] 08/17/2010 1105   LABSPEC 1.017 08/17/2010 1105   PHURINE 8.0 08/17/2010 1105   GLUCOSEU NEGATIVE 08/17/2010 1105   HGBUR NEGATIVE 08/17/2010 1105   BILIRUBINUR NEGATIVE 08/17/2010 1105   KETONESUR NEGATIVE 08/17/2010 1105   PROTEINUR NEGATIVE 08/17/2010 1105   UROBILINOGEN 0.2 08/17/2010 1105   NITRITE NEGATIVE 08/17/2010 1105   LEUKOCYTESUR NEGATIVE  08/17/2010 1105    STUDIES: No results found.   ELIGIBLE FOR AVAILABLE RESEARCH PROTOCOL: no  ASSESSMENT: 74 y.o. [place] woman  1.  Status post left breast upper outer quadrant biopsy and left axillary needle core biopsy on 11/23/2008 for clinical T3 N1, stage IIA invasive lobular carcinoma, E-cadherin negative, estrogen and progesterone receptor positive, HER-2 not amplified, with an MIB-1 of 17% Ki-67 17%  2.    On neoadjuvant letrozole started in 12/2008, interrupted at the time of her definitive surgery, then resumed post-op  4.  Status post left breast lumpectomy with regional lymph node resection 07/12/2009 for a stage IIA, pT1c pN1, invasive lobular carcinoma,with angiolymphatic invasion identified  (a) 2 of 8 removed left nodes were involved   (b) repeat prognostic panel again estrogen receptor positive, not progesterone receptor negative, with an MIB-1 of 74% and HER-2 not amplified   (c) additional surgery for margin clearance 08/04/2009 showed only atypical lobular hyperplasia  5. Oncotype score of 20 predicts a 5-year rate of recurrence outside the breast with antiestrogens as the only systemic therapy of 13%.  It also predicts no significant benefit from chemotherapy.   6.  status post radiation therapy 09/26/2009-11/11/18  7.  letrozole continued through December 2015 (5 years)  METASTATIC DISEASE: 8.  Lumbar MRI 10/04/2019 suggests metastatic disease to bone  (a) CT of the chest and bone scan 11/05/2019 confirm multiple lytic and sclerotic bone lesions,  but no evidence of visceral disease  (b) bone marrow biopsy 11/19/2019 confirms metastatic carcinoma, estrogen receptor positive and gross cystic disease fluid protein positive, progesterone receptor negative  (c) CA 27-29 and CEA are not informative (CA 27-29 is 41.3 on 11/03/2019)  9.  zoledronate started 11/10/2019, repeat every 12 weeks  10.  anastrozole started 11/05/2019  (a) palbociclib 125 mg daily,  21/7, to start 12/02/2019   PLAN: Hallee Mckenny is doing remarkably well as far as her metastatic breast cancer is concerned.  She has currently no bone pain or any other symptoms from the disease itself.  She is also tolerating her treatment quite well.  She has minor issues with hot flashes and we are going to start gabapentin at bedtime which I think will be helpful.  I suggested she use Claritin during the day for her nasal stuffiness.  She will have her second zoledronate dose in January.  In February we will obtain a bone scan and she understands that we will look worse than the original 1 but actually be better.  That February bone scan will serve as our baseline for future reference  She knows to call for any other issue that may develop before her next visit with me which will be late February  Total encounter time 25 minutes.Sarajane Jews C. Louise Victory, MD 12/30/2019 12:46 PM Medical Oncology and Hematology [facility] [place], [place] [address] Tel. [phone]    Fax. [phone]   This document serves as a record of services personally performed by Lurline Del, MD. It was created on his behalf by Wilburn Mylar, a trained medical scribe. The creation of this record is based on the scribe's personal observations and the provider's statements to them.   I, Lurline Del MD, have reviewed the above documentation for accuracy and completeness, and I agree with the above.   *Total Encounter Time as defined by the Centers for Medicare and Medicaid Services includes, in addition to the face-to-face time of a patient visit (documented in the note above) non-face-to-face time: obtaining and reviewing outside history, ordering and reviewing medications, tests or procedures, care coordination (communications with other health care professionals or caregivers) and documentation in the medical record.

## 2019-12-31 ENCOUNTER — Telehealth: Payer: Self-pay | Admitting: Oncology

## 2019-12-31 NOTE — Telephone Encounter (Signed)
Scheduled appts per 12/9 los. Pt confirmed appt date and time.

## 2020-01-20 ENCOUNTER — Other Ambulatory Visit: Payer: Medicare Other

## 2020-01-20 DIAGNOSIS — Z20822 Contact with and (suspected) exposure to covid-19: Secondary | ICD-10-CM | POA: Diagnosis not present

## 2020-01-24 LAB — NOVEL CORONAVIRUS, NAA: SARS-CoV-2, NAA: NOT DETECTED

## 2020-01-31 ENCOUNTER — Telehealth: Payer: Self-pay

## 2020-01-31 NOTE — Telephone Encounter (Signed)
Oral Oncology Patient Advocate Encounter   Was successful in securing patient a $6000 grant from [place] to provide copayment coverage for Ibrance.  This will keep the out of pocket expense at $0.       The billing information is as follows and has been shared with [place].   Member ID: [id number] Group ID: [id number] RxBin: [id number] PCN: [id number] Dates of Eligibility: 01/28/20 through 01/27/21  Fund name:  Metastatic breast  [place] Patient [place] Phone [phone] Fax [phone] 01/31/2020 1:25 PM

## 2020-01-31 NOTE — Telephone Encounter (Signed)
Oral Oncology Patient Advocate Encounter   Was successful in securing patient an $81 grant from Patient [facility]) to provide copayment coverage for Ibrance.  This will keep the out of pocket expense at $0.     I have spoken with the patient.    The billing information is as follows and has been shared with [place].   Member ID: [id number] Group ID: [id number] RxBin: [id number] Dates of Eligibility: 10/30/19 through 01/26/21  Fund:  Metastatic breast  [place] Patient [place] Phone [phone] Fax [phone] 01/31/2020 1:23 PM

## 2020-02-01 ENCOUNTER — Inpatient Hospital Stay: Payer: Medicare Other | Attending: Oncology

## 2020-02-01 ENCOUNTER — Other Ambulatory Visit: Payer: Self-pay

## 2020-02-01 ENCOUNTER — Inpatient Hospital Stay: Payer: Medicare Other

## 2020-02-01 VITALS — BP 119/68 | HR 64 | Temp 98.6°F | Resp 17

## 2020-02-01 DIAGNOSIS — Z79811 Long term (current) use of aromatase inhibitors: Secondary | ICD-10-CM | POA: Diagnosis not present

## 2020-02-01 DIAGNOSIS — C50412 Malignant neoplasm of upper-outer quadrant of left female breast: Secondary | ICD-10-CM | POA: Insufficient documentation

## 2020-02-01 DIAGNOSIS — C7951 Secondary malignant neoplasm of bone: Secondary | ICD-10-CM | POA: Diagnosis not present

## 2020-02-01 DIAGNOSIS — Z170 Estrogen receptor positive status [ER+]: Secondary | ICD-10-CM

## 2020-02-01 DIAGNOSIS — Z923 Personal history of irradiation: Secondary | ICD-10-CM | POA: Insufficient documentation

## 2020-02-01 DIAGNOSIS — F17210 Nicotine dependence, cigarettes, uncomplicated: Secondary | ICD-10-CM | POA: Diagnosis not present

## 2020-02-01 DIAGNOSIS — I739 Peripheral vascular disease, unspecified: Secondary | ICD-10-CM

## 2020-02-01 DIAGNOSIS — I10 Essential (primary) hypertension: Secondary | ICD-10-CM

## 2020-02-01 LAB — CBC WITH DIFFERENTIAL/PLATELET
Abs Immature Granulocytes: 0.01 10*3/uL (ref 0.00–0.07)
Basophils Absolute: 0.1 10*3/uL (ref 0.0–0.1)
Basophils Relative: 1 %
Eosinophils Absolute: 0.1 10*3/uL (ref 0.0–0.5)
Eosinophils Relative: 2 %
HCT: 36.4 % (ref 36.0–46.0)
Hemoglobin: 12.5 g/dL (ref 12.0–15.0)
Immature Granulocytes: 0 %
Lymphocytes Relative: 51 %
Lymphs Abs: 2.5 10*3/uL (ref 0.7–4.0)
MCH: 34.4 pg — ABNORMAL HIGH (ref 26.0–34.0)
MCHC: 34.3 g/dL (ref 30.0–36.0)
MCV: 100.3 fL — ABNORMAL HIGH (ref 80.0–100.0)
Monocytes Absolute: 0.4 10*3/uL (ref 0.1–1.0)
Monocytes Relative: 8 %
Neutro Abs: 2 10*3/uL (ref 1.7–7.7)
Neutrophils Relative %: 38 %
Platelets: 256 10*3/uL (ref 150–400)
RBC: 3.63 MIL/uL — ABNORMAL LOW (ref 3.87–5.11)
RDW: 17.2 % — ABNORMAL HIGH (ref 11.5–15.5)
WBC: 5.1 10*3/uL (ref 4.0–10.5)
nRBC: 0 % (ref 0.0–0.2)

## 2020-02-01 LAB — COMPREHENSIVE METABOLIC PANEL
ALT: 17 U/L (ref 0–44)
AST: 20 U/L (ref 15–41)
Albumin: 3.9 g/dL (ref 3.5–5.0)
Alkaline Phosphatase: 73 U/L (ref 38–126)
Anion gap: 9 (ref 5–15)
BUN: 11 mg/dL (ref 8–23)
CO2: 28 mmol/L (ref 22–32)
Calcium: 9.5 mg/dL (ref 8.9–10.3)
Chloride: 103 mmol/L (ref 98–111)
Creatinine, Ser: 1.02 mg/dL — ABNORMAL HIGH (ref 0.44–1.00)
GFR, Estimated: 58 mL/min — ABNORMAL LOW (ref >60)
Glucose, Bld: 117 mg/dL — ABNORMAL HIGH (ref 70–99)
Potassium: 3.4 mmol/L — ABNORMAL LOW (ref 3.5–5.1)
Sodium: 140 mmol/L (ref 135–145)
Total Bilirubin: 0.5 mg/dL (ref 0.3–1.2)
Total Protein: 7.7 g/dL (ref 6.5–8.1)

## 2020-02-01 MED ORDER — SODIUM CHLORIDE 0.9 % IV SOLN
INTRAVENOUS | Status: DC
  Filled 2020-02-01: qty 250

## 2020-02-01 MED ORDER — ZOLEDRONIC ACID 4 MG/100ML IV SOLN
4.0000 mg | Freq: Once | INTRAVENOUS | Status: AC
  Administered 2020-02-01: 4 mg via INTRAVENOUS

## 2020-02-01 MED ORDER — ZOLEDRONIC ACID 4 MG/100ML IV SOLN
INTRAVENOUS | Status: AC
  Filled 2020-02-01: qty 100

## 2020-02-01 NOTE — Patient Instructions (Signed)
Zoledronic Acid injection (Hypercalcemia, Oncology) What is this medicine? ZOLEDRONIC ACID (ZOE le dron ik AS id) lowers the amount of calcium loss from bone. It is used to treat too much calcium in your blood from cancer. It is also used to prevent complications of cancer that has spread to the bone. This medicine may be used for other purposes; ask your health care provider or pharmacist if you have questions. COMMON BRAND NAME(S): Zometa What should I tell my health care provider before I take this medicine? They need to know if you have any of these conditions:  aspirin-sensitive asthma  cancer, especially if you are receiving medicines used to treat cancer  dental disease or wear dentures  infection  kidney disease  receiving corticosteroids like dexamethasone or prednisone  an unusual or allergic reaction to zoledronic acid, other medicines, foods, dyes, or preservatives  pregnant or trying to get pregnant  breast-feeding How should I use this medicine? This medicine is for infusion into a vein. It is given by a health care professional in a hospital or clinic setting. Talk to your pediatrician regarding the use of this medicine in children. Special care may be needed. Overdosage: If you think you have taken too much of this medicine contact a poison control center or emergency room at once. NOTE: This medicine is only for you. Do not share this medicine with others. What if I miss a dose? It is important not to miss your dose. Call your doctor or health care professional if you are unable to keep an appointment. What may interact with this medicine?  certain antibiotics given by injection  NSAIDs, medicines for pain and inflammation, like ibuprofen or naproxen  some diuretics like bumetanide, furosemide  teriparatide  thalidomide This list may not describe all possible interactions. Give your health care provider a list of all the medicines, herbs, non-prescription  drugs, or dietary supplements you use. Also tell them if you smoke, drink alcohol, or use illegal drugs. Some items may interact with your medicine. What should I watch for while using this medicine? Visit your doctor or health care professional for regular checkups. It may be some time before you see the benefit from this medicine. Do not stop taking your medicine unless your doctor tells you to. Your doctor may order blood tests or other tests to see how you are doing. Women should inform their doctor if they wish to become pregnant or think they might be pregnant. There is a potential for serious side effects to an unborn child. Talk to your health care professional or pharmacist for more information. You should make sure that you get enough calcium and vitamin D while you are taking this medicine. Discuss the foods you eat and the vitamins you take with your health care professional. Some people who take this medicine have severe bone, joint, and/or muscle pain. This medicine may also increase your risk for jaw problems or a broken thigh bone. Tell your doctor right away if you have severe pain in your jaw, bones, joints, or muscles. Tell your doctor if you have any pain that does not go away or that gets worse. Tell your dentist and dental surgeon that you are taking this medicine. You should not have major dental surgery while on this medicine. See your dentist to have a dental exam and fix any dental problems before starting this medicine. Take good care of your teeth while on this medicine. Make sure you see your dentist for regular follow-up   appointments. What side effects may I notice from receiving this medicine? Side effects that you should report to your doctor or health care professional as soon as possible:  allergic reactions like skin rash, itching or hives, swelling of the face, lips, or tongue  anxiety, confusion, or depression  breathing problems  changes in vision  eye  pain  feeling faint or lightheaded, falls  jaw pain, especially after dental work  mouth sores  muscle cramps, stiffness, or weakness  redness, blistering, peeling or loosening of the skin, including inside the mouth  trouble passing urine or change in the amount of urine Side effects that usually do not require medical attention (report to your doctor or health care professional if they continue or are bothersome):  bone, joint, or muscle pain  constipation  diarrhea  fever  hair loss  irritation at site where injected  loss of appetite  nausea, vomiting  stomach upset  trouble sleeping  trouble swallowing  weak or tired This list may not describe all possible side effects. Call your doctor for medical advice about side effects. You may report side effects to FDA at 1-800-FDA-1088. Where should I keep my medicine? This drug is given in a hospital or clinic and will not be stored at home. NOTE: This sheet is a summary. It may not cover all possible information. If you have questions about this medicine, talk to your doctor, pharmacist, or health care provider.  2020 Elsevier/Gold Standard (2013-06-05 14:19:39)  

## 2020-02-02 ENCOUNTER — Other Ambulatory Visit (HOSPITAL_COMMUNITY): Payer: Self-pay | Admitting: Cardiovascular Disease

## 2020-02-02 ENCOUNTER — Ambulatory Visit (HOSPITAL_COMMUNITY)
Admission: RE | Admit: 2020-02-02 | Discharge: 2020-02-02 | Disposition: A | Discharge status: Home / Self Care | Payer: Medicare Other | Source: Ambulatory Visit | Attending: Cardiology | Admitting: Cardiology

## 2020-02-02 DIAGNOSIS — I739 Peripheral vascular disease, unspecified: Secondary | ICD-10-CM | POA: Insufficient documentation

## 2020-02-02 DIAGNOSIS — Z9862 Peripheral vascular angioplasty status: Secondary | ICD-10-CM

## 2020-02-08 NOTE — Telephone Encounter (Signed)
Patient is approved for Ibrance at no charge 02/08/20-01/20/21 from [employer].  [place] Patient [place] Phone [phone] Fax [phone] 02/08/2020 3:34 PM

## 2020-03-09 ENCOUNTER — Ambulatory Visit (HOSPITAL_COMMUNITY)
Admission: RE | Admit: 2020-03-09 | Discharge: 2020-03-09 | Disposition: A | Discharge status: Home / Self Care | Payer: Medicare Other | Source: Ambulatory Visit | Attending: Oncology | Admitting: Oncology

## 2020-03-09 ENCOUNTER — Encounter (HOSPITAL_COMMUNITY)
Admission: RE | Admit: 2020-03-09 | Discharge: 2020-03-09 | Disposition: A | Discharge status: Home / Self Care | Payer: Medicare Other | Source: Ambulatory Visit | Attending: Oncology | Admitting: Oncology

## 2020-03-09 ENCOUNTER — Other Ambulatory Visit: Payer: Self-pay

## 2020-03-09 DIAGNOSIS — C50412 Malignant neoplasm of upper-outer quadrant of left female breast: Secondary | ICD-10-CM | POA: Insufficient documentation

## 2020-03-09 DIAGNOSIS — C50919 Malignant neoplasm of unspecified site of unspecified female breast: Secondary | ICD-10-CM | POA: Diagnosis not present

## 2020-03-09 DIAGNOSIS — I10 Essential (primary) hypertension: Secondary | ICD-10-CM | POA: Insufficient documentation

## 2020-03-09 DIAGNOSIS — E782 Mixed hyperlipidemia: Secondary | ICD-10-CM | POA: Diagnosis not present

## 2020-03-09 DIAGNOSIS — C7951 Secondary malignant neoplasm of bone: Secondary | ICD-10-CM | POA: Insufficient documentation

## 2020-03-09 DIAGNOSIS — Z170 Estrogen receptor positive status [ER+]: Secondary | ICD-10-CM | POA: Insufficient documentation

## 2020-03-09 DIAGNOSIS — Z720 Tobacco use: Secondary | ICD-10-CM | POA: Insufficient documentation

## 2020-03-09 DIAGNOSIS — C799 Secondary malignant neoplasm of unspecified site: Secondary | ICD-10-CM | POA: Diagnosis not present

## 2020-03-09 MED ORDER — TECHNETIUM TC 99M MEDRONATE IV KIT
22.0000 | PACK | Freq: Once | INTRAVENOUS | Status: AC
  Administered 2020-03-09: 22 via INTRAVENOUS

## 2020-03-13 NOTE — Progress Notes (Signed)
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   Since her last visit, she underwent restaging bone scan on 03/09/2020 showing: widely metastatic disease again noted; findings concerning for progression of disease-- new bilateral rib lesions, more conspicuous lesion involving left femur. However the patient started zoledronate after the prior bone scan. It is possible of what we are seeing is actually healing of lytic lesions  She continues on anastrozole.  She has significant hot flashes and mild vaginal dryness.  She was also started on zolendronate on 11/09/2019.  She had no side effects from this dose that she is aware of. She was instructed to take some Tums or other calcium on the day of treatment to prevent temporary hypocalcemia  She started palbociclib on 12/02/2019.  She says this takes her appetite away.  However she has not lost any weight. She has no fatigue associated with this.   REVIEW OF SYSTEMS: Leslie [name] generally feels well. Occasionally when she blows her nose there is a little bit of blood. She says she does not sleep well. The only site of pain is her left hip and it only occurs when she stands for very long time for example when cooking in the kitchen. She does have a treadmill at home which she is not using currently. She was recently started on  atorvastatin but thinks the current medication is too high. A detailed review of systems today was otherwise stable   COVID 19 VACCINATION STATUS: Status post Moderna x2 with booster November 2021   HISTORY OF CURRENT ILLNESS: From Dr. Eston [name] he patient evaluation note dated 12/07/2008:  "This is a pleasant 75 year old woman from [facility] referred by [facility] for evaluation and treatment of breast cancer. This woman has been in relatively good health.  She undergoes annual screening mammography.  Her husband is a patient of Dr. Julien [name] who has lung cancer.  She had a screening mammogram on 11/11/2008, which showed an abnormality.  A follow up diagnostic mammogram and right breast ultrasound was performed.  This showed a hyperdense spiculated mass 12oclock left breast measuring 5 x 3 x 1.5 cm.  There is an associated abnormality seen on ultrasound as well.  Biopsy was recommended with BSGI scan performed 11/23/2008 showed an uptake with a 3 cm of intense isotope activity.  An ultrasound-guided biopsy of both the breast mass in the left breast as well as left axilla was performed on 11/23/2008, both of which show invasive mammary carcinoma.  Prognostic panel showed this to be ER positive at 99%, PR positive at 7%, proliferative index 17%, HER-2 was negative with ratio of 1.2.  An E-cadherin test was performed, which was negative suggestive of lobular phenotype.  The patient has subsequently been referred for a neoadjuvant therapy.  Of note is she had an MRI scan on 12/01/2008, which showed a 4.9 x 3.5 x 2.9 cm area of lobular carcinoma 12 oclock position left breast.  Axillary lymph nodes  were also seen measuring 1.6 x 1.1 cm.  Multiple right breast cysts were also seen as well."   The patient's subsequent history is as detailed below.   PAST MEDICAL HISTORY: Past Medical History:  Diagnosis Date   Benign breast cyst in female    Right breast   Cancer of left breast ([place]) 2010    Depression    History of blood transfusion    "related to tubal pregnancy"   Hypertension    PAD (peripheral artery disease) ([place])     PAST SURGICAL HISTORY: Past Surgical History:  Procedure Laterality Date   ABDOMINAL HYSTERECTOMY  1980s   BREAST CYST EXCISION Right ~ 2005   BREAST LUMPECTOMY Left 11/23/2008   ECTOPIC PREGNANCY SURGERY  1966   LOWER EXTREMITY ANGIOGRAM  06/19/2017   LOWER EXTREMITY ANGIOGRAPHY N/A 06/19/2017   Procedure: LOWER EXTREMITY ANGIOGRAPHY;  Surgeon: Leslie Harp, MD;  Location: [facility];  Service: Cardiovascular;  Laterality: N/A;   PERIPHERAL VASCULAR BALLOON ANGIOPLASTY Left 06/19/2017   SFA    PERIPHERAL VASCULAR BALLOON ANGIOPLASTY  06/19/2017   Procedure: PERIPHERAL VASCULAR BALLOON ANGIOPLASTY;  Surgeon: Leslie Harp, MD;  Location: [facility];  Service: Cardiovascular;;  SFA left  hysterectomy in her 56s secondary to tubal pregnancy, ovaries were intact   FAMILY HISTORY: Family History  Problem Relation Age of Onset   Hypertension Mother    Seizures Mother    Heart disease Father    Hypertension Sister    No history of breast or ovarian cancer in the family.  Father had a history of bad heart; mother died at age 22.    The patient has five sisters, two brothers, all of whom in good health.   GYNECOLOGIC HISTORY:  No LMP recorded. Patient has had a hysterectomy. Menarche: 75 years old Age at first live birth:    years old Leslie [name] 4 HRT yes  Hysterectomy? yes BSO? no   SOCIAL HISTORY: (updated 10/2019)  Leslie [name] "Leslie [name]" retired from working as a Engineer, mining for [employer]. She is widowed. Her husband of 12 years had a history of lung cancer. She lives by herself with no pets.  She has three grown sons that live in [place], [place], and [place].  The patient has 1 granddaughter, age 39, who is attending college studying to be an Chief Financial Officer.  The patient attends a [employer]    ADVANCED DIRECTIVES: not in place; the patient plans to name her son Leslie [name], who lives in [place] (and works in Pharmacist, hospital) as her healthcare power of attorney.  He can be reached at [phone]   HEALTH MAINTENANCE: Social History   Tobacco Use   Smoking status: Current Every Day Smoker    Packs/day: 0.50    Years: 45.00    Pack years: 22.50   Smokeless tobacco: Never Used  Vaping Use   Vaping Use: Never used  Substance Use Topics   Alcohol use: Yes    Comment: 06/19/2017 "glass of wine 3-4 times/year"   Drug use: Never     Colonoscopy: none on file  PAP: 2012?  Bone density:    Allergies  Allergen Reactions   Aspirin Nausea Only   Zyrtec [Cetirizine] Other (See Comments)    Chest Pain    Current Outpatient Medications  Medication Sig Dispense Refill   venlafaxine XR (EFFEXOR-XR) 37.5 MG 24 hr capsule Take 1 capsule (37.5 mg total) by mouth daily with breakfast. 90 capsule  4   acetaminophen (TYLENOL) 500 MG tablet Take 500-1,000 mg by mouth every 6 (six) hours as needed for moderate pain or headache.     anastrozole (ARIMIDEX) 1 MG tablet Take 1 tablet (1 mg total) by mouth daily. 90 tablet 4   atenolol-chlorthalidone (TENORETIC) 50-25 MG per tablet Take 0.5 tablets by mouth daily.      cholecalciferol (VITAMIN D) 400 units TABS tablet Take 400 Units by mouth daily.     Evening Primrose Oil 1000 MG CAPS Take 1,000 mg by mouth daily.     fluocinonide cream (LIDEX) 7.82 % Apply 1 application topically 2 (two) times daily as needed (for break outs).     folic acid (FOLVITE) 423 MCG tablet Take 400 mcg by mouth daily.      gabapentin (NEURONTIN) 300 MG capsule Take 1 capsule (300 mg total) by mouth at bedtime. 90 capsule 4   Multiple Vitamin (MULTIVITAMIN PO) Take 1 tablet by mouth daily.      palbociclib (IBRANCE) 125 MG tablet Take 1 tablet (125 mg total) by mouth daily. Take for 21 days on, 7 days off, repeat every 28  days. Start 12/01/19 21 tablet 6   triamcinolone cream (KENALOG) 0.1 % Apply topically.     vitamin E 400 UNIT capsule Take 400 Units by mouth daily.     No current facility-administered medications for this visit.    OBJECTIVE: African-American woman who appears well  Vitals:   03/14/20 1332  BP: 127/62  Pulse: (!) 18  Temp: 97.7 F (36.5 C)  SpO2: 100%     Body mass index is 20.31 kg/m.   Wt Readings from Last 3 Encounters:  03/14/20 118 lb 4.8 oz (53.7 kg)  12/30/19 117 lb 8 oz (53.3 kg)  12/01/19 116 lb 14.4 oz (53 kg)      ECOG FS:1 - Symptomatic but completely ambulatory  Sclerae unicteric, EOMs intact Wearing a mask No cervical or supraclavicular adenopathy Lungs no rales or rhonchi Heart regular rate and rhythm Abd soft, nontender, positive bowel sounds MSK no focal spinal tenderness, no upper extremity lymphedema Neuro: nonfocal, well oriented, appropriate affect Breasts: The right breast is benign. The left breast is status post lumpectomy and radiation with no evidence of disease recurrence. Both axillae are benign.   LAB RESULTS:  CMP     Component Value Date/Time   NA 140 03/14/2020 1227   NA 145 (H) 06/04/2017 1513   NA 141 02/09/2013 1056   K 3.4 (L) 03/14/2020 1227   K 3.4 (L) 02/09/2013 1056   CL 104 03/14/2020 1227   CL 102 06/03/2012 1106   CO2 27 03/14/2020 1227   CO2 27 02/09/2013 1056   GLUCOSE 89 03/14/2020 1227   GLUCOSE 134 02/09/2013 1056   GLUCOSE 101 (H) 06/03/2012 1106   BUN 11 03/14/2020 1227   BUN 11 06/04/2017 1513   BUN 11.0 02/09/2013 1056   CREATININE 1.02 (H) 03/14/2020 1227   CREATININE 0.77 11/19/2019 0929   CREATININE 0.9 02/09/2013 1056   CALCIUM 9.1 03/14/2020 1227   CALCIUM 9.8 02/09/2013 1056   PROT 7.2 03/14/2020 1227   PROT 7.2 10/06/2018 1005   PROT 7.4 02/09/2013 1056   ALBUMIN 3.9 03/14/2020 1227   ALBUMIN 4.4 10/06/2018 1005   ALBUMIN 4.0 02/09/2013 1056   AST 18 03/14/2020 1227   AST 20 11/19/2019  0929   AST 19 02/09/2013 1056   ALT 16 03/14/2020 1227   ALT 25 11/19/2019 0929   ALT  20 02/09/2013 1056   ALKPHOS 69 03/14/2020 1227   ALKPHOS 68 02/09/2013 1056   BILITOT 0.4 03/14/2020 1227   BILITOT 0.4 11/19/2019 0929   BILITOT 0.49 02/09/2013 1056   GFRNONAA 58 (L) 03/14/2020 1227   GFRNONAA >60 11/19/2019 0929   GFRAA >60 06/20/2017 0249    Lab Results  Component Value Date   TOTALPROTELP 7.5 11/03/2019   ALBUMINELP 4.0 11/03/2019   A1GS 0.2 11/03/2019   A2GS 0.8 11/03/2019   BETS 1.1 11/03/2019   GAMS 1.4 11/03/2019   MSPIKE Not Observed 11/03/2019   SPEI Comment 11/03/2019    Lab Results  Component Value Date   WBC 4.3 03/14/2020   NEUTROABS 1.3 (L) 03/14/2020   HGB 11.9 (L) 03/14/2020   HCT 34.5 (L) 03/14/2020   MCV 105.8 (H) 03/14/2020   PLT 211 03/14/2020    Lab Results  Component Value Date   LABCA2 33 12/05/2011    No components found for: [id number]  No results for input(s): INR in the last 168 hours.  Lab Results  Component Value Date   LABCA2 33 12/05/2011    No results found for: CAN199  No results found for: CAN125  No results found for: RKY454  Lab Results  Component Value Date   CA2729 41.3 (H) 11/03/2019    No components found for: HGQUANT  Lab Results  Component Value Date   CEA1 2.33 11/03/2019   /  CEA ([facility])  Date Value Ref Range Status  11/03/2019 2.33 0.00 - 5.00 ng/mL Final    Comment:    (NOTE) This test was performed using Architect's Chemiluminescent Microparticle Immunoassay. Values obtained from different assay methods cannot be used interchangeably. Please note that 5-10% of patients who smoke may see CEA levels up to 6.9 ng/mL. Performed at [facility], [address], [place], [place] [address]      No results found for: AFPTUMOR  No results found for: [facility]  Lab Results  Component Value Date   KPAFRELGTCHN 25.8 (H) 11/03/2019   LAMBDASER 17.7 11/03/2019    KAPLAMBRATIO 1.46 11/03/2019   (kappa/lambda light chains)  No results found for: HGBA, HGBA2QUANT, HGBFQUANT, HGBSQUAN (Hemoglobinopathy evaluation)   Lab Results  Component Value Date   LDH 182 06/03/2012    No results found for: IRON, TIBC, IRONPCTSAT (Iron and TIBC)  No results found for: FERRITIN  Urinalysis    Component Value Date/Time   COLORURINE YELLOW 08/17/2010 1105   APPEARANCEUR CLEAR 08/17/2010 1105   LABSPEC 1.017 08/17/2010 1105   PHURINE 8.0 08/17/2010 1105   GLUCOSEU NEGATIVE 08/17/2010 1105   HGBUR NEGATIVE 08/17/2010 1105   BILIRUBINUR NEGATIVE 08/17/2010 1105   KETONESUR NEGATIVE 08/17/2010 1105   PROTEINUR NEGATIVE 08/17/2010 1105   UROBILINOGEN 0.2 08/17/2010 1105   NITRITE NEGATIVE 08/17/2010 1105   LEUKOCYTESUR NEGATIVE 08/17/2010 1105    STUDIES: NM Bone Scan Whole Body  Result Date: 03/09/2020 CLINICAL DATA:  Breast cancer with known bone Mets EXAM: NUCLEAR MEDICINE WHOLE BODY BONE SCAN TECHNIQUE: Whole body anterior and posterior images were obtained approximately 3 hours after intravenous injection of radiopharmaceutical. RADIOPHARMACEUTICALS:  Twenty-two mCi Technetium-58m MDP IV COMPARISON:  11/05/2019 FINDINGS: Again noted is extensive osseous metastatic disease involving the bilateral ribs, thoracolumbar spine, proximal left humerus, and bilateral femurs. There appears to be extensive metastatic disease involving the sacrum and bilateral acetabuli. The activity within the left femur appears to have increased since the prior study. The activity within the remaining lesions appears to  be nearly stable. There are findings suspicious for new osseous metastatic lesions for example in the posterior right eleventh rib and posterior left eleventh rib. IMPRESSION: Again noted is widely metastatic disease as detailed above. There are findings concerning for progression of disease as evidence by new bilateral rib lesions in addition to a lesion involving  the patient's left femur which appears more conspicuous on today's exam. Electronically Signed   By: Constance Holster M.D.   On: 03/09/2020 21:16     ELIGIBLE FOR AVAILABLE RESEARCH PROTOCOL: no  ASSESSMENT: 75 y.o. [place] woman  1.  Status post left breast upper outer quadrant biopsy and left axillary needle core biopsy on 11/23/2008 for clinical T3 N1, stage IIA invasive lobular carcinoma, E-cadherin negative, estrogen and progesterone receptor positive, HER-2 not amplified, with an MIB-1 of 17% Ki-67 17%  2.    On neoadjuvant letrozole started in 12/2008, interrupted at the time of her definitive surgery, then resumed post-op  4.  Status post left breast lumpectomy with regional lymph node resection 07/12/2009 for a stage IIA, pT1c pN1, invasive lobular carcinoma,with angiolymphatic invasion identified  (a) 2 of 8 removed left nodes were involved   (b) repeat prognostic panel again estrogen receptor positive, not progesterone receptor negative, with an MIB-1 of 74% and HER-2 not amplified   (c) additional surgery for margin clearance 08/04/2009 showed only atypical lobular hyperplasia  5. Oncotype score of 20 predicts a 5-year rate of recurrence outside the breast with antiestrogens as the only systemic therapy of 13%.  It also predicts no significant benefit from chemotherapy.   6.  status post radiation therapy 09/26/2009-11/11/18  7.  letrozole continued through December 2015 (5 years)  METASTATIC DISEASE: September 2021 8.  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   zoledronate started 11/10/2019, repeat every 12 weeks  10.  anastrozole started 11/05/2019  (a) palbociclib 125 mg daily, 21/7, started 12/02/2019   PLAN: Oumou Smead looks remarkably well clinically and has minimal if any symptoms related to her cancer. She is also tolerating treatment well.  The bone scan recently obtained shows some areas of concern. However these may well be lytic areas which are now healing over as the patient is receiving zoledronate. The February bone scan really serves as the baseline for future reference. I reassured Leslie [name] that I do not believe her disease is progressing and in fact clinically she is improved as discussed  She is having hot flashes from the anastrozole and I am starting her on venlafaxine at low dose to see if that is helpful.  She will have restaging studies mid May. She will see me again with her June zoledronate dose  She knows to call for any other issue that may develop before the next visit  Total encounter time 25 minutes.*  Total encounter time 25 minutes.Sarajane Jews C. Devell Parkerson, MD 03/14/2020 5:59 PM Medical Oncology and Hematology [facility] [place], [place] [address] Tel. [phone]    Fax. [phone]   This document serves as a record of services personally performed by Lurline Del, MD. It was  created on his behalf by Wilburn Mylar, a trained medical scribe. The creation of this record is based on the scribe's personal observations and the provider's statements to them.   I, Lurline Del MD, have reviewed the above documentation for accuracy and completeness, and I agree with the above.   *Total Encounter Time as defined by the Centers for Medicare and Medicaid Services includes, in addition to the face-to-face time of a patient visit (documented in the note above) non-face-to-face time: obtaining and reviewing outside history, ordering and reviewing medications, tests or  procedures, care coordination (communications with other health care professionals or caregivers) and documentation in the medical record.

## 2020-03-14 ENCOUNTER — Inpatient Hospital Stay: Payer: Medicare Other

## 2020-03-14 ENCOUNTER — Other Ambulatory Visit: Payer: Self-pay

## 2020-03-14 ENCOUNTER — Inpatient Hospital Stay: Payer: Medicare Other | Attending: Oncology | Admitting: Oncology

## 2020-03-14 VITALS — BP 127/62 | HR 18 | Temp 97.7°F | Ht 64.0 in | Wt 118.3 lb

## 2020-03-14 DIAGNOSIS — R232 Flushing: Secondary | ICD-10-CM | POA: Insufficient documentation

## 2020-03-14 DIAGNOSIS — C7951 Secondary malignant neoplasm of bone: Secondary | ICD-10-CM | POA: Insufficient documentation

## 2020-03-14 DIAGNOSIS — I10 Essential (primary) hypertension: Secondary | ICD-10-CM

## 2020-03-14 DIAGNOSIS — C50412 Malignant neoplasm of upper-outer quadrant of left female breast: Secondary | ICD-10-CM | POA: Diagnosis not present

## 2020-03-14 DIAGNOSIS — Z170 Estrogen receptor positive status [ER+]: Secondary | ICD-10-CM | POA: Diagnosis not present

## 2020-03-14 DIAGNOSIS — F17210 Nicotine dependence, cigarettes, uncomplicated: Secondary | ICD-10-CM | POA: Insufficient documentation

## 2020-03-14 DIAGNOSIS — I739 Peripheral vascular disease, unspecified: Secondary | ICD-10-CM

## 2020-03-14 DIAGNOSIS — Z79899 Other long term (current) drug therapy: Secondary | ICD-10-CM | POA: Diagnosis not present

## 2020-03-14 DIAGNOSIS — Z79811 Long term (current) use of aromatase inhibitors: Secondary | ICD-10-CM | POA: Insufficient documentation

## 2020-03-14 DIAGNOSIS — Z923 Personal history of irradiation: Secondary | ICD-10-CM | POA: Diagnosis not present

## 2020-03-14 LAB — CBC WITH DIFFERENTIAL/PLATELET
Abs Immature Granulocytes: 0.01 10*3/uL (ref 0.00–0.07)
Basophils Absolute: 0 10*3/uL (ref 0.0–0.1)
Basophils Relative: 1 %
Eosinophils Absolute: 0.1 10*3/uL (ref 0.0–0.5)
Eosinophils Relative: 1 %
HCT: 34.5 % — ABNORMAL LOW (ref 36.0–46.0)
Hemoglobin: 11.9 g/dL — ABNORMAL LOW (ref 12.0–15.0)
Immature Granulocytes: 0 %
Lymphocytes Relative: 59 %
Lymphs Abs: 2.5 10*3/uL (ref 0.7–4.0)
MCH: 36.5 pg — ABNORMAL HIGH (ref 26.0–34.0)
MCHC: 34.5 g/dL (ref 30.0–36.0)
MCV: 105.8 fL — ABNORMAL HIGH (ref 80.0–100.0)
Monocytes Absolute: 0.4 10*3/uL (ref 0.1–1.0)
Monocytes Relative: 8 %
Neutro Abs: 1.3 10*3/uL — ABNORMAL LOW (ref 1.7–7.7)
Neutrophils Relative %: 31 %
Platelets: 211 10*3/uL (ref 150–400)
RBC: 3.26 MIL/uL — ABNORMAL LOW (ref 3.87–5.11)
RDW: 14.6 % (ref 11.5–15.5)
WBC: 4.3 10*3/uL (ref 4.0–10.5)
nRBC: 0 % (ref 0.0–0.2)

## 2020-03-14 LAB — COMPREHENSIVE METABOLIC PANEL
ALT: 16 U/L (ref 0–44)
AST: 18 U/L (ref 15–41)
Albumin: 3.9 g/dL (ref 3.5–5.0)
Alkaline Phosphatase: 69 U/L (ref 38–126)
Anion gap: 9 (ref 5–15)
BUN: 11 mg/dL (ref 8–23)
CO2: 27 mmol/L (ref 22–32)
Calcium: 9.1 mg/dL (ref 8.9–10.3)
Chloride: 104 mmol/L (ref 98–111)
Creatinine, Ser: 1.02 mg/dL — ABNORMAL HIGH (ref 0.44–1.00)
GFR, Estimated: 58 mL/min — ABNORMAL LOW (ref >60)
Glucose, Bld: 89 mg/dL (ref 70–99)
Potassium: 3.4 mmol/L — ABNORMAL LOW (ref 3.5–5.1)
Sodium: 140 mmol/L (ref 135–145)
Total Bilirubin: 0.4 mg/dL (ref 0.3–1.2)
Total Protein: 7.2 g/dL (ref 6.5–8.1)

## 2020-03-14 MED ORDER — VENLAFAXINE HCL ER 37.5 MG PO CP24: 37.5000 mg | ORAL_CAPSULE | Freq: Every day | ORAL | 4 refills | Status: DC

## 2020-03-15 ENCOUNTER — Telehealth: Payer: Self-pay | Admitting: Oncology

## 2020-03-15 NOTE — Telephone Encounter (Signed)
Scheduled follow up with GM between appts that were already on pt's schedule. Made no changes to original arrival time.

## 2020-04-25 ENCOUNTER — Other Ambulatory Visit: Payer: Self-pay

## 2020-04-25 ENCOUNTER — Inpatient Hospital Stay: Payer: Medicare Other | Attending: Oncology

## 2020-04-25 ENCOUNTER — Inpatient Hospital Stay: Payer: Medicare Other

## 2020-04-25 ENCOUNTER — Encounter: Payer: Self-pay | Admitting: Oncology

## 2020-04-25 VITALS — BP 124/62 | HR 66 | Temp 98.1°F | Resp 17

## 2020-04-25 DIAGNOSIS — C50412 Malignant neoplasm of upper-outer quadrant of left female breast: Secondary | ICD-10-CM

## 2020-04-25 DIAGNOSIS — Z79811 Long term (current) use of aromatase inhibitors: Secondary | ICD-10-CM | POA: Diagnosis not present

## 2020-04-25 DIAGNOSIS — I10 Essential (primary) hypertension: Secondary | ICD-10-CM

## 2020-04-25 DIAGNOSIS — Z79899 Other long term (current) drug therapy: Secondary | ICD-10-CM | POA: Insufficient documentation

## 2020-04-25 DIAGNOSIS — Z170 Estrogen receptor positive status [ER+]: Secondary | ICD-10-CM

## 2020-04-25 DIAGNOSIS — Z923 Personal history of irradiation: Secondary | ICD-10-CM | POA: Diagnosis not present

## 2020-04-25 DIAGNOSIS — C7951 Secondary malignant neoplasm of bone: Secondary | ICD-10-CM | POA: Insufficient documentation

## 2020-04-25 DIAGNOSIS — F17210 Nicotine dependence, cigarettes, uncomplicated: Secondary | ICD-10-CM | POA: Insufficient documentation

## 2020-04-25 DIAGNOSIS — I739 Peripheral vascular disease, unspecified: Secondary | ICD-10-CM

## 2020-04-25 DIAGNOSIS — Z853 Personal history of malignant neoplasm of breast: Secondary | ICD-10-CM | POA: Diagnosis not present

## 2020-04-25 DIAGNOSIS — R232 Flushing: Secondary | ICD-10-CM | POA: Diagnosis not present

## 2020-04-25 LAB — COMPREHENSIVE METABOLIC PANEL
ALT: 27 U/L (ref 0–44)
AST: 23 U/L (ref 15–41)
Albumin: 3.9 g/dL (ref 3.5–5.0)
Alkaline Phosphatase: 65 U/L (ref 38–126)
Anion gap: 12 (ref 5–15)
BUN: 12 mg/dL (ref 8–23)
CO2: 28 mmol/L (ref 22–32)
Calcium: 9.6 mg/dL (ref 8.9–10.3)
Chloride: 102 mmol/L (ref 98–111)
Creatinine, Ser: 1.08 mg/dL — ABNORMAL HIGH (ref 0.44–1.00)
GFR, Estimated: 54 mL/min — ABNORMAL LOW (ref >60)
Glucose, Bld: 91 mg/dL (ref 70–99)
Potassium: 3.3 mmol/L — ABNORMAL LOW (ref 3.5–5.1)
Sodium: 142 mmol/L (ref 135–145)
Total Bilirubin: 0.6 mg/dL (ref 0.3–1.2)
Total Protein: 7.3 g/dL (ref 6.5–8.1)

## 2020-04-25 LAB — CBC WITH DIFFERENTIAL/PLATELET
Abs Immature Granulocytes: 0 10*3/uL (ref 0.00–0.07)
Basophils Absolute: 0.1 10*3/uL (ref 0.0–0.1)
Basophils Relative: 1 %
Eosinophils Absolute: 0 10*3/uL (ref 0.0–0.5)
Eosinophils Relative: 0 %
HCT: 33.3 % — ABNORMAL LOW (ref 36.0–46.0)
Hemoglobin: 12 g/dL (ref 12.0–15.0)
Immature Granulocytes: 0 %
Lymphocytes Relative: 42 %
Lymphs Abs: 2.2 10*3/uL (ref 0.7–4.0)
MCH: 38.1 pg — ABNORMAL HIGH (ref 26.0–34.0)
MCHC: 36 g/dL (ref 30.0–36.0)
MCV: 105.7 fL — ABNORMAL HIGH (ref 80.0–100.0)
Monocytes Absolute: 0.4 10*3/uL (ref 0.1–1.0)
Monocytes Relative: 8 %
Neutro Abs: 2.6 10*3/uL (ref 1.7–7.7)
Neutrophils Relative %: 49 %
Platelets: 246 10*3/uL (ref 150–400)
RBC: 3.15 MIL/uL — ABNORMAL LOW (ref 3.87–5.11)
RDW: 13.9 % (ref 11.5–15.5)
WBC: 5.2 10*3/uL (ref 4.0–10.5)
nRBC: 0 % (ref 0.0–0.2)

## 2020-04-25 MED ORDER — SODIUM CHLORIDE 0.9 % IV SOLN
Freq: Once | INTRAVENOUS | Status: AC
  Filled 2020-04-25: qty 250

## 2020-04-25 MED ORDER — ZOLEDRONIC ACID 4 MG/100ML IV SOLN
4.0000 mg | Freq: Once | INTRAVENOUS | Status: AC
  Administered 2020-04-25: 4 mg via INTRAVENOUS

## 2020-04-25 MED ORDER — ZOLEDRONIC ACID 4 MG/100ML IV SOLN
INTRAVENOUS | Status: AC
  Filled 2020-04-25: qty 100

## 2020-04-25 NOTE — Patient Instructions (Signed)
Zoledronic Acid Injection (Hypercalcemia, Oncology) What is this medicine? ZOLEDRONIC ACID (ZOE le dron ik AS id) slows calcium loss from bones. It high calcium levels in the blood from some kinds of cancer. It may be used in other people at risk for bone loss. This medicine may be used for other purposes; ask your health care provider or pharmacist if you have questions. COMMON BRAND NAME(S): Zometa What should I tell my health care provider before I take this medicine? They need to know if you have any of these conditions:  cancer  dehydration  dental disease  kidney disease  liver disease  low levels of calcium in the blood  lung or breathing disease (asthma)  receiving steroids like dexamethasone or prednisone  an unusual or allergic reaction to zoledronic acid, other medicines, foods, dyes, or preservatives  pregnant or trying to get pregnant  breast-feeding How should I use this medicine? This drug is injected into a vein. It is given by a health care provider in a hospital or clinic setting. Talk to your health care provider about the use of this drug in children. Special care may be needed. Overdosage: If you think you have taken too much of this medicine contact a poison control center or emergency room at once. NOTE: This medicine is only for you. Do not share this medicine with others. What if I miss a dose? Keep appointments for follow-up doses. It is important not to miss your dose. Call your health care provider if you are unable to keep an appointment. What may interact with this medicine?  certain antibiotics given by injection  NSAIDs, medicines for pain and inflammation, like ibuprofen or naproxen  some diuretics like bumetanide, furosemide  teriparatide  thalidomide This list may not describe all possible interactions. Give your health care provider a list of all the medicines, herbs, non-prescription drugs, or dietary supplements you use. Also tell  them if you smoke, drink alcohol, or use illegal drugs. Some items may interact with your medicine. What should I watch for while using this medicine? Visit your health care provider for regular checks on your progress. It may be some time before you see the benefit from this drug. Some people who take this drug have severe bone, joint, or muscle pain. This drug may also increase your risk for jaw problems or a broken thigh bone. Tell your health care provider right away if you have severe pain in your jaw, bones, joints, or muscles. Tell you health care provider if you have any pain that does not go away or that gets worse. Tell your dentist and dental surgeon that you are taking this drug. You should not have major dental surgery while on this drug. See your dentist to have a dental exam and fix any dental problems before starting this drug. Take good care of your teeth while on this drug. Make sure you see your dentist for regular follow-up appointments. You should make sure you get enough calcium and vitamin D while you are taking this drug. Discuss the foods you eat and the vitamins you take with your health care provider. Check with your health care provider if you have severe diarrhea, nausea, and vomiting, or if you sweat a lot. The loss of too much body fluid may make it dangerous for you to take this drug. You may need blood work done while you are taking this drug. Do not become pregnant while taking this drug. Women should inform their health care provider   if they wish to become pregnant or think they might be pregnant. There is potential for serious harm to an unborn child. Talk to your health care provider for more information. What side effects may I notice from receiving this medicine? Side effects that you should report to your doctor or health care provider as soon as possible:  allergic reactions (skin rash, itching or hives; swelling of the face, lips, or tongue)  bone  pain  infection (fever, chills, cough, sore throat, pain or trouble passing urine)  jaw pain, especially after dental work  joint pain  kidney injury (trouble passing urine or change in the amount of urine)  low blood pressure (dizziness; feeling faint or lightheaded, falls; unusually weak or tired)  low calcium levels (fast heartbeat; muscle cramps or pain; pain, tingling, or numbness in the hands or feet; seizures)  low magnesium levels (fast, irregular heartbeat; muscle cramp or pain; muscle weakness; tremors; seizures)  low red blood cell counts (trouble breathing; feeling faint; lightheaded, falls; unusually weak or tired)  muscle pain  redness, blistering, peeling, or loosening of the skin, including inside the mouth  severe diarrhea  swelling of the ankles, feet, hands  trouble breathing Side effects that usually do not require medical attention (report to your doctor or health care provider if they continue or are bothersome):  anxious  constipation  coughing  depressed mood  eye irritation, itching, or pain  fever  general ill feeling or flu-like symptoms  nausea  pain, redness, or irritation at site where injected  trouble sleeping This list may not describe all possible side effects. Call your doctor for medical advice about side effects. You may report side effects to FDA at 1-800-FDA-1088. Where should I keep my medicine? This drug is given in a hospital or clinic. It will not be stored at home. NOTE: This sheet is a summary. It may not cover all possible information. If you have questions about this medicine, talk to your doctor, pharmacist, or health care provider.  2021 Elsevier/Gold Standard (2018-10-22 09:13:00)  

## 2020-05-30 DIAGNOSIS — R059 Cough, unspecified: Secondary | ICD-10-CM | POA: Diagnosis not present

## 2020-06-02 ENCOUNTER — Encounter (HOSPITAL_COMMUNITY)
Admission: RE | Admit: 2020-06-02 | Discharge: 2020-06-02 | Disposition: A | Discharge status: Home / Self Care | Payer: Medicare Other | Source: Ambulatory Visit | Attending: Oncology | Admitting: Oncology

## 2020-06-02 ENCOUNTER — Other Ambulatory Visit: Payer: Self-pay

## 2020-06-02 ENCOUNTER — Ambulatory Visit (HOSPITAL_COMMUNITY)
Admission: RE | Admit: 2020-06-02 | Discharge: 2020-06-02 | Disposition: A | Discharge status: Home / Self Care | Payer: Medicare Other | Source: Ambulatory Visit | Attending: Oncology | Admitting: Oncology

## 2020-06-02 DIAGNOSIS — M19011 Primary osteoarthritis, right shoulder: Secondary | ICD-10-CM | POA: Diagnosis not present

## 2020-06-02 DIAGNOSIS — C50412 Malignant neoplasm of upper-outer quadrant of left female breast: Secondary | ICD-10-CM

## 2020-06-02 DIAGNOSIS — M19012 Primary osteoarthritis, left shoulder: Secondary | ICD-10-CM | POA: Diagnosis not present

## 2020-06-02 DIAGNOSIS — C7951 Secondary malignant neoplasm of bone: Secondary | ICD-10-CM | POA: Insufficient documentation

## 2020-06-02 DIAGNOSIS — Z170 Estrogen receptor positive status [ER+]: Secondary | ICD-10-CM | POA: Diagnosis not present

## 2020-06-02 DIAGNOSIS — I2510 Atherosclerotic heart disease of native coronary artery without angina pectoris: Secondary | ICD-10-CM | POA: Diagnosis not present

## 2020-06-02 DIAGNOSIS — C50919 Malignant neoplasm of unspecified site of unspecified female breast: Secondary | ICD-10-CM | POA: Diagnosis not present

## 2020-06-02 DIAGNOSIS — J432 Centrilobular emphysema: Secondary | ICD-10-CM | POA: Diagnosis not present

## 2020-06-02 LAB — POCT I-STAT CREATININE: Creatinine, Ser: 1.1 mg/dL — ABNORMAL HIGH (ref 0.44–1.00)

## 2020-06-02 MED ORDER — IOHEXOL 300 MG/ML  SOLN
75.0000 mL | Freq: Once | INTRAMUSCULAR | Status: AC | PRN
  Administered 2020-06-02: 75 mL via INTRAVENOUS

## 2020-06-02 MED ORDER — TECHNETIUM TC 99M MEDRONATE IV KIT
20.5000 | PACK | Freq: Once | INTRAVENOUS | Status: AC
  Administered 2020-06-02: 20.5 via INTRAVENOUS

## 2020-06-02 MED ORDER — SODIUM CHLORIDE (PF) 0.9 % IJ SOLN
INTRAMUSCULAR | Status: AC
  Filled 2020-06-02: qty 50

## 2020-06-05 ENCOUNTER — Other Ambulatory Visit: Payer: Self-pay | Admitting: Oncology

## 2020-06-05 NOTE — Progress Notes (Signed)
I called Leslie [name] and gave her the results of her scans which show stable disease.  We are making no changes in her treatment.  She will follow-up with me in June as already scheduled.

## 2020-06-15 DIAGNOSIS — Z23 Encounter for immunization: Secondary | ICD-10-CM | POA: Diagnosis not present

## 2020-07-17 NOTE — Progress Notes (Signed)
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   Since her last visit, she underwent bilateral diagnostic mammography with tomography at [facility] on 04/25/2020 showing: breast density category B; no evidence of malignancy in either breast.   She also underwent restaging chest CT and bone scan on 06/02/2020. Chest CT showed: no evidence of new or progressive metastatic disease; unchanged sclerotic metastases throughout visualized bones.  Bone scan showed: diffuse metastatic disease within axial skeleton, not significantly changed from 02/2020.  She continues on anastrozole.  She has significant hot flashes and mild vaginal dryness.  She was also started on zolendronate on 11/09/2019.  She had no side effects from this dose that she is aware of. She was instructed to take some Tums or other calcium on the day of treatment to prevent temporary hypocalcemia.  She receives this every 12 weeks, with a dose due today  She started palbociclib on 12/02/2019.  She says this takes her appetite away.  However she has not lost any weight. She has no fatigue associated with this.   REVIEW OF SYSTEMS: Leslie [name] tells me she has a pain in her left flank the does not radiate down the leg or elsewhere.  It is not associated with bowel movements or urine problems and specifically she has had no  hematuria or fever.  She says when the left flank does not hurt in the right shoulder hurts.  If she stands to do the dishes that is when it bothers her.  When she walks it feels better.  It also feels better if she takes a little Tylenol or if she sits down for a couple of minutes.  For exercise she uses a treadmill at home.  She is planning a trip to [place] with her son and a nephew and his wife.  This is a trip they take every year and she really looks forward to it aside from that a detailed review of systems today was stable   COVID 19 VACCINATION STATUS: Status post Moderna x2 with booster x2   HISTORY OF CURRENT ILLNESS: From [name] he patient evaluation note dated 12/07/2008:   "This is a pleasant 75 year old woman from [facility] referred by Dr. [name] for evaluation and treatment of breast cancer. This woman has been in relatively good health.  She undergoes annual screening mammography.  Her husband is a patient of Dr. [name] who has lung cancer.  She had a screening mammogram on 11/11/2008, which showed an abnormality.  A follow up diagnostic mammogram and right breast ultrasound was performed.  This showed a hyperdense spiculated mass 12 o'clock left breast measuring 5 x 3 x 1.5 cm.  There is an associated abnormality seen on ultrasound as well.  Biopsy was recommended with BSGI scan performed 11/23/2008 showed an uptake with a 3 cm of intense isotope activity.  An ultrasound-guided biopsy of both the breast mass in the left breast as well as left axilla  was performed on 11/23/2008, both of which show invasive mammary carcinoma.  Prognostic panel showed this to be ER positive at 99%, PR positive at 7%, proliferative index 17%, HER-2 was negative with ratio of 1.2.  An E-cadherin test was performed, which was negative suggestive of lobular phenotype.  The patient has subsequently been referred for a neoadjuvant therapy.  Of note is she had an MRI scan on 12/01/2008, which showed a  4.9 x 3.5 x 2.9 cm area of lobular carcinoma 12 o'clock position left breast.  Axillary lymph nodes were also seen measuring 1.6 x 1.1 cm.  Multiple right breast cysts were also seen as well."   The patient's subsequent history is as detailed below.   PAST MEDICAL HISTORY: Past Medical History:  Diagnosis Date   Benign breast cyst in female    Right breast   Cancer of left breast ([place]) 2010   Depression    History of blood transfusion    "related to tubal pregnancy"   Hypertension    PAD (peripheral artery disease) ([place])     PAST SURGICAL HISTORY: Past Surgical History:  Procedure Laterality Date   ABDOMINAL HYSTERECTOMY  1980s   BREAST CYST EXCISION Right ~ 2005   BREAST LUMPECTOMY Left 11/23/2008   ECTOPIC PREGNANCY SURGERY  1966   LOWER EXTREMITY ANGIOGRAM  06/19/2017   LOWER EXTREMITY ANGIOGRAPHY N/A 06/19/2017   Procedure: LOWER EXTREMITY ANGIOGRAPHY;  Surgeon: Lorretta Harp, MD;  Location: [place] [facility];  Service: Cardiovascular;  Laterality: N/A;   PERIPHERAL VASCULAR BALLOON ANGIOPLASTY Left 06/19/2017   SFA    PERIPHERAL VASCULAR BALLOON ANGIOPLASTY  06/19/2017   Procedure: PERIPHERAL VASCULAR BALLOON ANGIOPLASTY;  Surgeon: Lorretta Harp, MD;  Location: [place] [facility];  Service: Cardiovascular;;  SFA left  hysterectomy in her 32s secondary to tubal pregnancy, ovaries were intact   FAMILY HISTORY: Family History  Problem Relation Age of Onset   Hypertension Mother    Seizures Mother    Heart disease Father    Hypertension Sister    No history of breast or ovarian cancer in the family.  Father had a history of bad heart; mother died at age 32.    The patient has five sisters, two brothers, all of whom in good health.   GYNECOLOGIC HISTORY:  No LMP recorded. Patient has had a hysterectomy. Menarche: 75 years old Age at first live birth:    years old [place] 3 HRT yes  Hysterectomy? yes BSO? no   SOCIAL HISTORY: (updated 10/2019)  Leslie Kingman "Eletha Culbertson" retired from working as a Engineer, mining for [employer]. She is widowed. Her husband of 30 years had a history of lung cancer. She lives by herself with no pets.  She has three grown sons that live in [place], [place], and [place].  The patient has 1 granddaughter, age 1, who is attending college studying to be an Chief Financial Officer.  The patient attends a [employer]    ADVANCED DIRECTIVES: not in place; the patient plans to name her son Keela Rubert, who lives in [place] (and works in Pharmacist, hospital) as her healthcare power of attorney.  He can be reached at [phone]   HEALTH MAINTENANCE: Social History   Tobacco Use   Smoking status: Every Day    Packs/day: 0.50    Years: 45.00    Pack years: 22.50    Types: Cigarettes   Smokeless tobacco: Never  Vaping Use   Vaping Use: Never used  Substance Use Topics   Alcohol use: Yes    Comment: 06/19/2017 "glass of wine 3-4 times/year"   Drug use: Never     Colonoscopy: none on file  PAP: 2012?  Bone density:    Allergies  Allergen Reactions   Aspirin Nausea Only   Zyrtec [Cetirizine] Other (See Comments)    Chest Pain    Current Outpatient Medications  Medication Sig Dispense Refill   acetaminophen (TYLENOL) 500 MG tablet Take 500-1,000 mg by mouth every 6 (six) hours as needed for moderate pain or headache.     anastrozole (ARIMIDEX) 1 MG tablet Take 1 tablet (1 mg total) by mouth daily. 90 tablet 4   atenolol-chlorthalidone (TENORETIC) 50-25 MG per tablet Take 0.5 tablets by mouth daily.      cholecalciferol (VITAMIN D) 400 units TABS tablet Take 400 Units by mouth daily.     Evening Primrose Oil 1000 MG CAPS Take 1,000 mg by mouth daily.     fluocinonide cream (LIDEX) 2.42 % Apply 1 application topically 2 (two) times daily as needed (for break outs).     folic acid (FOLVITE) 353 MCG tablet Take 400 mcg by mouth daily.      gabapentin (NEURONTIN) 300 MG capsule Take 1 capsule (300 mg total) by  mouth at bedtime. 90 capsule 4   Multiple Vitamin (MULTIVITAMIN PO) Take 1 tablet by mouth daily.      palbociclib (IBRANCE) 125 MG tablet Take 1 tablet (125 mg total) by mouth daily. Take for 21 days on, 7 days off, repeat every 28 days. Start 12/01/19 21 tablet 6   triamcinolone cream (KENALOG) 0.1 % Apply topically.     venlafaxine XR (EFFEXOR-XR) 37.5 MG 24 hr capsule Take 1 capsule (37.5 mg total) by mouth daily with breakfast. 90 capsule 4   vitamin E 400 UNIT capsule Take 400 Units by mouth daily.     No current facility-administered medications for this visit.    OBJECTIVE: African-American woman in no acute distress  Vitals:   07/18/20 1327  BP: 126/64  Pulse: 66  Resp: 18  Temp: (!) 97.4 F (36.3 C)  SpO2: 100%     Body mass index is 20.36 kg/m.   Wt Readings from Last 3 Encounters:  07/18/20 118 lb 9.6 oz (53.8 kg)  03/14/20 118 lb 4.8 oz (53.7 kg)  12/30/19 117 lb 8 oz (53.3 kg)     ECOG FS:1 - Symptomatic but completely ambulatory  Sclerae unicteric, EOMs intact Wearing a mask No cervical or supraclavicular adenopathy Lungs no rales or rhonchi Heart regular rate and rhythm Abd soft, nontender, positive bowel sounds MSK no focal spinal tenderness, no upper extremity lymphedema Neuro: nonfocal, well oriented, appropriate affect Breasts: The right breast is unremarkable.  The left breast has undergone lumpectomy followed by radiation.  There is no evidence of local recurrence.  Both axillae are benign.   LAB RESULTS:  CMP     Component Value Date/Time   NA 142 04/25/2020 1252   NA 145 (H) 06/04/2017 1513   NA 141 02/09/2013 1056   K 3.3 ([name]) 04/25/2020 1252   K 3.4 ([name]) 02/09/2013 1056   CL 102 04/25/2020 1252   CL 102 06/03/2012 1106   CO2 28 04/25/2020 1252   CO2 27 02/09/2013 1056   GLUCOSE 91 04/25/2020 1252   GLUCOSE 134 02/09/2013 1056   GLUCOSE 101 (H) 06/03/2012 1106   BUN 12 04/25/2020 1252   BUN 11 06/04/2017 1513   BUN 11.0 02/09/2013  1056    CREATININE 1.10 (H) 06/02/2020 1232   CREATININE 0.77 11/19/2019 0929   CREATININE 0.9 02/09/2013 1056   CALCIUM 9.6 04/25/2020 1252   CALCIUM 9.8 02/09/2013 1056   PROT 7.3 04/25/2020 1252   PROT 7.2 10/06/2018 1005   PROT 7.4 02/09/2013 1056   ALBUMIN 3.9 04/25/2020 1252   ALBUMIN 4.4 10/06/2018 1005   ALBUMIN 4.0 02/09/2013 1056   AST 23 04/25/2020 1252   AST 20 11/19/2019 0929   AST 19 02/09/2013 1056   ALT 27 04/25/2020 1252   ALT 25 11/19/2019 0929   ALT 20 02/09/2013 1056   ALKPHOS 65 04/25/2020 1252   ALKPHOS 68 02/09/2013 1056   BILITOT 0.6 04/25/2020 1252   BILITOT 0.4 11/19/2019 0929   BILITOT 0.49 02/09/2013 1056   GFRNONAA 54 ([name]) 04/25/2020 1252   GFRNONAA >60 11/19/2019 0929   GFRAA >60 06/20/2017 0249    Lab Results  Component Value Date   TOTALPROTELP 7.5 11/03/2019   ALBUMINELP 4.0 11/03/2019   A1GS 0.2 11/03/2019   A2GS 0.8 11/03/2019   BETS 1.1 11/03/2019   GAMS 1.4 11/03/2019   MSPIKE Not Observed 11/03/2019   SPEI Comment 11/03/2019    Lab Results  Component Value Date   WBC 5.2 07/18/2020   NEUTROABS 2.6 07/18/2020   HGB 11.7 ([name]) 07/18/2020   HCT 32.9 ([name]) 07/18/2020   MCV 106.5 (H) 07/18/2020   PLT 282 07/18/2020    Lab Results  Component Value Date   LABCA2 33 12/05/2011    No components found for: [id number]  No results for input(s): INR in the last 168 hours.  Lab Results  Component Value Date   LABCA2 33 12/05/2011    No results found for: CAN199  No results found for: CAN125  No results found for: QMG867  Lab Results  Component Value Date   CA2729 41.3 (H) 11/03/2019    No components found for: HGQUANT  Lab Results  Component Value Date   CEA1 2.33 11/03/2019   /  CEA ([facility])  Date Value Ref Range Status  11/03/2019 2.33 0.00 - 5.00 ng/mL Final    Comment:    (NOTE) This test was performed using Architect's Chemiluminescent Microparticle Immunoassay. Values obtained from different assay methods  cannot be used interchangeably. Please note that 5-10% of patients who smoke may see CEA levels up to 6.9 ng/mL. Performed at [facility], [place] [address] [place], [place] [address]      No results found for: AFPTUMOR  No results found for: [facility]  Lab Results  Component Value Date   KPAFRELGTCHN 25.8 (H) 11/03/2019   LAMBDASER 17.7 11/03/2019   KAPLAMBRATIO 1.46 11/03/2019   (kappa/lambda light chains)  No results found for: HGBA, HGBA2QUANT, HGBFQUANT, HGBSQUAN (Hemoglobinopathy evaluation)   Lab Results  Component Value Date   LDH 182 06/03/2012    No results found for: IRON, TIBC, IRONPCTSAT (Iron and TIBC)  No results found for: FERRITIN  Urinalysis    Component Value Date/Time   COLORURINE YELLOW 08/17/2010 [place] 08/17/2010 1105   LABSPEC 1.017 08/17/2010 1105   PHURINE 8.0 08/17/2010 1105   GLUCOSEU NEGATIVE 08/17/2010 1105   HGBUR NEGATIVE 08/17/2010 1105   BILIRUBINUR NEGATIVE 08/17/2010 1105   KETONESUR NEGATIVE 08/17/2010 1105   PROTEINUR NEGATIVE 08/17/2010 1105   UROBILINOGEN 0.2 08/17/2010 1105   NITRITE NEGATIVE 08/17/2010 1105   LEUKOCYTESUR NEGATIVE 08/17/2010 1105    STUDIES: No results found.   ELIGIBLE  FOR AVAILABLE RESEARCH PROTOCOL: no  ASSESSMENT: 75 y.o. [place] woman  1.  Status post left breast upper outer quadrant biopsy and left axillary needle core biopsy on 11/23/2008 for clinical T3 N1, stage IIA invasive lobular carcinoma, E-cadherin negative, estrogen and progesterone receptor positive, HER-2 not amplified, with an MIB-1 of 17% Ki-67 17%   2.    On neoadjuvant letrozole started in 12/2008, interrupted at the time of her definitive surgery, then resumed post-op   4.  Status post left breast lumpectomy with regional lymph node resection 07/12/2009 for a stage IIA, pT1c pN1, invasive lobular carcinoma,with angiolymphatic invasion identified  (a) 2 of 8 removed left nodes  were involved   (b) repeat prognostic panel again estrogen receptor positive, not progesterone receptor negative, with an MIB-1 of 74% and HER-2 not amplified   (c) additional surgery for margin clearance 08/04/2009 showed only atypical lobular hyperplasia   5. Oncotype score of 20 predicts a 5-year rate of recurrence outside the breast with antiestrogens as the only systemic therapy of 13%.  It also predicts no significant benefit from chemotherapy.    6.  status post radiation therapy 09/26/2009-11/11/18  7.  letrozole continued through December 2015 (5 years)  METASTATIC DISEASE: September 2021 8.  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  zoledronate started 11/10/2019, repeat every 12 weeks  10.  anastrozole started 11/05/2019  (a) palbociclib 125 mg daily, 21/7, started 12/02/2019  11. Restaging studies:  (A) chest CT with contrast 06/04/2020 shows no visceral disease, unchanged bony lesions  (B) bone scan 06/04/2020 shows no change compared to 03/09/2020   PLAN: Leslie [name] is just about a year out from initial diagnosis of metastatic breast cancer.  Her disease is very well controlled and she is tolerating her treatment remarkably well.  We have not had to make any dose reductions in her palbociclib.  We reviewed her restaging studies today and she understands it is very favorable not to have visceral disease.  She will return for lab work only every 4 weeks just to make sure the neutrophils do not drop too much, and she will see me again in 12 weeks with her next dose of  zoledronate  She knows to call for any other issue that may develop before the next visit  Total encounter time 25 minutes.Leslie Jews C. Tram Wrenn, MD 07/18/2020 1:53 PM Medical Oncology and Hematology [facility] [place], [place] [address] Tel. [phone]    Fax. [phone]   This document serves as a record of services personally performed by Lurline Del, MD. It was created on his behalf by Wilburn Mylar, a trained medical scribe. The creation of this record is based on the scribe's personal observations and the provider's statements to them.   I, Lurline Del MD, have reviewed the above documentation for accuracy and completeness, and I agree with the above.   *Total Encounter Time as defined by the Centers for Medicare and Medicaid Services includes, in addition to the face-to-face time of a patient visit (documented in the note above) non-face-to-face time: obtaining and reviewing outside  history, ordering and reviewing medications, tests or procedures, care coordination (communications with other health care professionals or caregivers) and documentation in the medical record.

## 2020-07-18 ENCOUNTER — Inpatient Hospital Stay: Payer: Medicare Other

## 2020-07-18 ENCOUNTER — Other Ambulatory Visit: Payer: Self-pay

## 2020-07-18 ENCOUNTER — Ambulatory Visit: Payer: Medicare Other

## 2020-07-18 ENCOUNTER — Other Ambulatory Visit (HOSPITAL_COMMUNITY): Payer: Self-pay

## 2020-07-18 ENCOUNTER — Inpatient Hospital Stay (HOSPITAL_BASED_OUTPATIENT_CLINIC_OR_DEPARTMENT_OTHER): Payer: Medicare Other | Admitting: Oncology

## 2020-07-18 ENCOUNTER — Telehealth: Payer: Self-pay | Admitting: Oncology

## 2020-07-18 ENCOUNTER — Inpatient Hospital Stay: Payer: Medicare Other | Attending: Oncology

## 2020-07-18 ENCOUNTER — Other Ambulatory Visit: Payer: Self-pay | Admitting: Pharmacist

## 2020-07-18 VITALS — BP 126/64 | HR 66 | Temp 97.4°F | Resp 18 | Ht 64.0 in | Wt 118.6 lb

## 2020-07-18 DIAGNOSIS — Z79899 Other long term (current) drug therapy: Secondary | ICD-10-CM | POA: Insufficient documentation

## 2020-07-18 DIAGNOSIS — Z170 Estrogen receptor positive status [ER+]: Secondary | ICD-10-CM | POA: Diagnosis not present

## 2020-07-18 DIAGNOSIS — R232 Flushing: Secondary | ICD-10-CM | POA: Insufficient documentation

## 2020-07-18 DIAGNOSIS — Z79811 Long term (current) use of aromatase inhibitors: Secondary | ICD-10-CM | POA: Diagnosis not present

## 2020-07-18 DIAGNOSIS — I10 Essential (primary) hypertension: Secondary | ICD-10-CM

## 2020-07-18 DIAGNOSIS — Z923 Personal history of irradiation: Secondary | ICD-10-CM | POA: Insufficient documentation

## 2020-07-18 DIAGNOSIS — C7951 Secondary malignant neoplasm of bone: Secondary | ICD-10-CM

## 2020-07-18 DIAGNOSIS — C50412 Malignant neoplasm of upper-outer quadrant of left female breast: Secondary | ICD-10-CM | POA: Insufficient documentation

## 2020-07-18 DIAGNOSIS — I739 Peripheral vascular disease, unspecified: Secondary | ICD-10-CM

## 2020-07-18 DIAGNOSIS — F17210 Nicotine dependence, cigarettes, uncomplicated: Secondary | ICD-10-CM | POA: Diagnosis not present

## 2020-07-18 LAB — CBC WITH DIFFERENTIAL/PLATELET
Abs Immature Granulocytes: 0.01 10*3/uL (ref 0.00–0.07)
Basophils Absolute: 0.1 10*3/uL (ref 0.0–0.1)
Basophils Relative: 2 %
Eosinophils Absolute: 0 10*3/uL (ref 0.0–0.5)
Eosinophils Relative: 1 %
HCT: 32.9 % — ABNORMAL LOW (ref 36.0–46.0)
Hemoglobin: 11.7 g/dL — ABNORMAL LOW (ref 12.0–15.0)
Immature Granulocytes: 0 %
Lymphocytes Relative: 38 %
Lymphs Abs: 2 10*3/uL (ref 0.7–4.0)
MCH: 37.9 pg — ABNORMAL HIGH (ref 26.0–34.0)
MCHC: 35.6 g/dL (ref 30.0–36.0)
MCV: 106.5 fL — ABNORMAL HIGH (ref 80.0–100.0)
Monocytes Absolute: 0.5 10*3/uL (ref 0.1–1.0)
Monocytes Relative: 10 %
Neutro Abs: 2.6 10*3/uL (ref 1.7–7.7)
Neutrophils Relative %: 49 %
Platelets: 282 10*3/uL (ref 150–400)
RBC: 3.09 MIL/uL — ABNORMAL LOW (ref 3.87–5.11)
RDW: 13.7 % (ref 11.5–15.5)
WBC: 5.2 10*3/uL (ref 4.0–10.5)
nRBC: 0 % (ref 0.0–0.2)

## 2020-07-18 LAB — COMPREHENSIVE METABOLIC PANEL
ALT: 24 U/L (ref 0–44)
AST: 25 U/L (ref 15–41)
Albumin: 3.7 g/dL (ref 3.5–5.0)
Alkaline Phosphatase: 65 U/L (ref 38–126)
Anion gap: 11 (ref 5–15)
BUN: 11 mg/dL (ref 8–23)
CO2: 28 mmol/L (ref 22–32)
Calcium: 10.1 mg/dL (ref 8.9–10.3)
Chloride: 101 mmol/L (ref 98–111)
Creatinine, Ser: 1.04 mg/dL — ABNORMAL HIGH (ref 0.44–1.00)
GFR, Estimated: 57 mL/min — ABNORMAL LOW (ref >60)
Glucose, Bld: 89 mg/dL (ref 70–99)
Potassium: 3.5 mmol/L (ref 3.5–5.1)
Sodium: 140 mmol/L (ref 135–145)
Total Bilirubin: 0.5 mg/dL (ref 0.3–1.2)
Total Protein: 7.7 g/dL (ref 6.5–8.1)

## 2020-07-18 MED ORDER — ANASTROZOLE 1 MG PO TABS
1.0000 mg | ORAL_TABLET | Freq: Every day | ORAL | 4 refills | Status: DC
  Filled 2020-07-18: qty 90, 90d supply, fill #0

## 2020-07-18 MED ORDER — PALBOCICLIB 125 MG PO TABS: 125.0000 mg | ORAL_TABLET | Freq: Every day | ORAL | 6 refills | Status: DC

## 2020-07-18 MED ORDER — ZOLEDRONIC ACID 4 MG/100ML IV SOLN
INTRAVENOUS | Status: AC
  Filled 2020-07-18: qty 100

## 2020-07-18 MED ORDER — PALBOCICLIB 125 MG PO TABS
125.0000 mg | ORAL_TABLET | Freq: Every day | ORAL | 6 refills | Status: DC
  Filled 2020-07-18: qty 21, 21d supply, fill #0

## 2020-07-18 MED ORDER — ZOLEDRONIC ACID 4 MG/100ML IV SOLN
4.0000 mg | Freq: Once | INTRAVENOUS | Status: AC
Start: 2020-07-18 — End: 2020-07-18
  Administered 2020-07-18: 4 mg via INTRAVENOUS

## 2020-07-18 NOTE — Progress Notes (Signed)
Oral Oncology Pharmacist Encounter  Prescription refill for Ibrance (palbociclib) sent to [facility] in error. Patient enrolled in manufacturer assistance and receives medication through [place] together program. Prescription redirected to [facility].  Leron Croak, PharmD, BCPS Hematology/Oncology Clinical Pharmacist [facility] [phone] 07/18/2020 2:07 PM

## 2020-07-18 NOTE — Patient Instructions (Signed)

## 2020-07-18 NOTE — Telephone Encounter (Signed)
Scheduled appointment per 06/28 los. Patient is aware.

## 2020-08-15 ENCOUNTER — Inpatient Hospital Stay: Payer: Medicare Other | Attending: Oncology

## 2020-08-15 ENCOUNTER — Other Ambulatory Visit: Payer: Self-pay

## 2020-08-15 DIAGNOSIS — C50412 Malignant neoplasm of upper-outer quadrant of left female breast: Secondary | ICD-10-CM | POA: Diagnosis not present

## 2020-08-15 DIAGNOSIS — I10 Essential (primary) hypertension: Secondary | ICD-10-CM

## 2020-08-15 DIAGNOSIS — C7951 Secondary malignant neoplasm of bone: Secondary | ICD-10-CM

## 2020-08-15 DIAGNOSIS — I739 Peripheral vascular disease, unspecified: Secondary | ICD-10-CM

## 2020-08-15 LAB — CBC WITH DIFFERENTIAL/PLATELET
Abs Immature Granulocytes: 0.01 10*3/uL (ref 0.00–0.07)
Basophils Absolute: 0.1 10*3/uL (ref 0.0–0.1)
Basophils Relative: 1 %
Eosinophils Absolute: 0 10*3/uL (ref 0.0–0.5)
Eosinophils Relative: 1 %
HCT: 30.1 % — ABNORMAL LOW (ref 36.0–46.0)
Hemoglobin: 10.8 g/dL — ABNORMAL LOW (ref 12.0–15.0)
Immature Granulocytes: 0 %
Lymphocytes Relative: 43 %
Lymphs Abs: 2.1 10*3/uL (ref 0.7–4.0)
MCH: 38.4 pg — ABNORMAL HIGH (ref 26.0–34.0)
MCHC: 35.9 g/dL (ref 30.0–36.0)
MCV: 107.1 fL — ABNORMAL HIGH (ref 80.0–100.0)
Monocytes Absolute: 0.6 10*3/uL (ref 0.1–1.0)
Monocytes Relative: 13 %
Neutro Abs: 2 10*3/uL (ref 1.7–7.7)
Neutrophils Relative %: 42 %
Platelets: 277 10*3/uL (ref 150–400)
RBC: 2.81 MIL/uL — ABNORMAL LOW (ref 3.87–5.11)
RDW: 14.8 % (ref 11.5–15.5)
Smear Review: NORMAL
WBC: 4.7 10*3/uL (ref 4.0–10.5)
nRBC: 0 % (ref 0.0–0.2)

## 2020-08-15 LAB — COMPREHENSIVE METABOLIC PANEL
ALT: 22 U/L (ref 0–44)
AST: 28 U/L (ref 15–41)
Albumin: 3.7 g/dL (ref 3.5–5.0)
Alkaline Phosphatase: 70 U/L (ref 38–126)
Anion gap: 10 (ref 5–15)
BUN: 11 mg/dL (ref 8–23)
CO2: 27 mmol/L (ref 22–32)
Calcium: 9.4 mg/dL (ref 8.9–10.3)
Chloride: 103 mmol/L (ref 98–111)
Creatinine, Ser: 1.18 mg/dL — ABNORMAL HIGH (ref 0.44–1.00)
GFR, Estimated: 49 mL/min — ABNORMAL LOW (ref >60)
Glucose, Bld: 108 mg/dL — ABNORMAL HIGH (ref 70–99)
Potassium: 3.2 mmol/L — ABNORMAL LOW (ref 3.5–5.1)
Sodium: 140 mmol/L (ref 135–145)
Total Bilirubin: 0.5 mg/dL (ref 0.3–1.2)
Total Protein: 7.7 g/dL (ref 6.5–8.1)

## 2020-09-12 ENCOUNTER — Other Ambulatory Visit: Payer: Self-pay

## 2020-09-12 ENCOUNTER — Inpatient Hospital Stay: Payer: Medicare Other | Attending: Oncology

## 2020-09-12 DIAGNOSIS — I10 Essential (primary) hypertension: Secondary | ICD-10-CM

## 2020-09-12 DIAGNOSIS — Z170 Estrogen receptor positive status [ER+]: Secondary | ICD-10-CM | POA: Diagnosis not present

## 2020-09-12 DIAGNOSIS — Z79899 Other long term (current) drug therapy: Secondary | ICD-10-CM | POA: Diagnosis not present

## 2020-09-12 DIAGNOSIS — F17210 Nicotine dependence, cigarettes, uncomplicated: Secondary | ICD-10-CM | POA: Insufficient documentation

## 2020-09-12 DIAGNOSIS — I739 Peripheral vascular disease, unspecified: Secondary | ICD-10-CM

## 2020-09-12 DIAGNOSIS — Z79811 Long term (current) use of aromatase inhibitors: Secondary | ICD-10-CM | POA: Insufficient documentation

## 2020-09-12 DIAGNOSIS — R232 Flushing: Secondary | ICD-10-CM | POA: Insufficient documentation

## 2020-09-12 DIAGNOSIS — C7951 Secondary malignant neoplasm of bone: Secondary | ICD-10-CM | POA: Insufficient documentation

## 2020-09-12 DIAGNOSIS — Z923 Personal history of irradiation: Secondary | ICD-10-CM | POA: Insufficient documentation

## 2020-09-12 DIAGNOSIS — C50412 Malignant neoplasm of upper-outer quadrant of left female breast: Secondary | ICD-10-CM | POA: Insufficient documentation

## 2020-09-12 LAB — COMPREHENSIVE METABOLIC PANEL
ALT: 32 U/L (ref 0–44)
AST: 30 U/L (ref 15–41)
Albumin: 3.5 g/dL (ref 3.5–5.0)
Alkaline Phosphatase: 68 U/L (ref 38–126)
Anion gap: 11 (ref 5–15)
BUN: 11 mg/dL (ref 8–23)
CO2: 27 mmol/L (ref 22–32)
Calcium: 9.7 mg/dL (ref 8.9–10.3)
Chloride: 102 mmol/L (ref 98–111)
Creatinine, Ser: 1.14 mg/dL — ABNORMAL HIGH (ref 0.44–1.00)
GFR, Estimated: 51 mL/min — ABNORMAL LOW (ref >60)
Glucose, Bld: 98 mg/dL (ref 70–99)
Potassium: 3.7 mmol/L (ref 3.5–5.1)
Sodium: 140 mmol/L (ref 135–145)
Total Bilirubin: 0.5 mg/dL (ref 0.3–1.2)
Total Protein: 7.4 g/dL (ref 6.5–8.1)

## 2020-09-12 LAB — CBC WITH DIFFERENTIAL/PLATELET
Abs Immature Granulocytes: 0.02 10*3/uL (ref 0.00–0.07)
Basophils Absolute: 0.1 10*3/uL (ref 0.0–0.1)
Basophils Relative: 1 %
Eosinophils Absolute: 0 10*3/uL (ref 0.0–0.5)
Eosinophils Relative: 0 %
HCT: 27.9 % — ABNORMAL LOW (ref 36.0–46.0)
Hemoglobin: 9.5 g/dL — ABNORMAL LOW (ref 12.0–15.0)
Immature Granulocytes: 0 %
Lymphocytes Relative: 35 %
Lymphs Abs: 1.8 10*3/uL (ref 0.7–4.0)
MCH: 36.7 pg — ABNORMAL HIGH (ref 26.0–34.0)
MCHC: 34.1 g/dL (ref 30.0–36.0)
MCV: 107.7 fL — ABNORMAL HIGH (ref 80.0–100.0)
Monocytes Absolute: 0.4 10*3/uL (ref 0.1–1.0)
Monocytes Relative: 8 %
Neutro Abs: 2.9 10*3/uL (ref 1.7–7.7)
Neutrophils Relative %: 56 %
Platelets: 339 10*3/uL (ref 150–400)
RBC: 2.59 MIL/uL — ABNORMAL LOW (ref 3.87–5.11)
RDW: 14.2 % (ref 11.5–15.5)
WBC: 5.2 10*3/uL (ref 4.0–10.5)
nRBC: 0 % (ref 0.0–0.2)

## 2020-09-13 DIAGNOSIS — Z961 Presence of intraocular lens: Secondary | ICD-10-CM | POA: Diagnosis not present

## 2020-09-13 DIAGNOSIS — H5213 Myopia, bilateral: Secondary | ICD-10-CM | POA: Diagnosis not present

## 2020-10-10 ENCOUNTER — Inpatient Hospital Stay: Payer: Medicare Other

## 2020-10-10 ENCOUNTER — Inpatient Hospital Stay: Payer: Medicare Other | Attending: Oncology

## 2020-10-10 ENCOUNTER — Other Ambulatory Visit: Payer: Self-pay

## 2020-10-10 VITALS — BP 103/55 | HR 70 | Temp 98.9°F | Resp 16 | Wt 112.8 lb

## 2020-10-10 DIAGNOSIS — R232 Flushing: Secondary | ICD-10-CM | POA: Diagnosis not present

## 2020-10-10 DIAGNOSIS — F17210 Nicotine dependence, cigarettes, uncomplicated: Secondary | ICD-10-CM | POA: Insufficient documentation

## 2020-10-10 DIAGNOSIS — Z170 Estrogen receptor positive status [ER+]: Secondary | ICD-10-CM

## 2020-10-10 DIAGNOSIS — C50412 Malignant neoplasm of upper-outer quadrant of left female breast: Secondary | ICD-10-CM | POA: Diagnosis not present

## 2020-10-10 DIAGNOSIS — C7951 Secondary malignant neoplasm of bone: Secondary | ICD-10-CM | POA: Diagnosis not present

## 2020-10-10 DIAGNOSIS — Z923 Personal history of irradiation: Secondary | ICD-10-CM | POA: Insufficient documentation

## 2020-10-10 DIAGNOSIS — I739 Peripheral vascular disease, unspecified: Secondary | ICD-10-CM

## 2020-10-10 DIAGNOSIS — Z79811 Long term (current) use of aromatase inhibitors: Secondary | ICD-10-CM | POA: Insufficient documentation

## 2020-10-10 DIAGNOSIS — Z79899 Other long term (current) drug therapy: Secondary | ICD-10-CM | POA: Insufficient documentation

## 2020-10-10 DIAGNOSIS — I10 Essential (primary) hypertension: Secondary | ICD-10-CM

## 2020-10-10 LAB — CBC WITH DIFFERENTIAL/PLATELET
Abs Immature Granulocytes: 0.02 10*3/uL (ref 0.00–0.07)
Basophils Absolute: 0 10*3/uL (ref 0.0–0.1)
Basophils Relative: 1 %
Eosinophils Absolute: 0 10*3/uL (ref 0.0–0.5)
Eosinophils Relative: 0 %
HCT: 27 % — ABNORMAL LOW (ref 36.0–46.0)
Hemoglobin: 9.5 g/dL — ABNORMAL LOW (ref 12.0–15.0)
Immature Granulocytes: 0 %
Lymphocytes Relative: 32 %
Lymphs Abs: 1.7 10*3/uL (ref 0.7–4.0)
MCH: 37.3 pg — ABNORMAL HIGH (ref 26.0–34.0)
MCHC: 35.2 g/dL (ref 30.0–36.0)
MCV: 105.9 fL — ABNORMAL HIGH (ref 80.0–100.0)
Monocytes Absolute: 0.6 10*3/uL (ref 0.1–1.0)
Monocytes Relative: 11 %
Neutro Abs: 3 10*3/uL (ref 1.7–7.7)
Neutrophils Relative %: 56 %
Platelets: 276 10*3/uL (ref 150–400)
RBC: 2.55 MIL/uL — ABNORMAL LOW (ref 3.87–5.11)
RDW: 14.9 % (ref 11.5–15.5)
WBC: 5.3 10*3/uL (ref 4.0–10.5)
nRBC: 0 % (ref 0.0–0.2)

## 2020-10-10 LAB — COMPREHENSIVE METABOLIC PANEL
ALT: 36 U/L (ref 0–44)
AST: 34 U/L (ref 15–41)
Albumin: 3.4 g/dL — ABNORMAL LOW (ref 3.5–5.0)
Alkaline Phosphatase: 66 U/L (ref 38–126)
Anion gap: 11 (ref 5–15)
BUN: 16 mg/dL (ref 8–23)
CO2: 27 mmol/L (ref 22–32)
Calcium: 11.9 mg/dL — ABNORMAL HIGH (ref 8.9–10.3)
Chloride: 97 mmol/L — ABNORMAL LOW (ref 98–111)
Creatinine, Ser: 1.36 mg/dL — ABNORMAL HIGH (ref 0.44–1.00)
GFR, Estimated: 41 mL/min — ABNORMAL LOW (ref >60)
Glucose, Bld: 145 mg/dL — ABNORMAL HIGH (ref 70–99)
Potassium: 3 mmol/L — ABNORMAL LOW (ref 3.5–5.1)
Sodium: 135 mmol/L (ref 135–145)
Total Bilirubin: 1 mg/dL (ref 0.3–1.2)
Total Protein: 7.6 g/dL (ref 6.5–8.1)

## 2020-10-10 MED ORDER — ZOLEDRONIC ACID 4 MG/100ML IV SOLN
4.0000 mg | Freq: Once | INTRAVENOUS | Status: AC
  Administered 2020-10-10: 4 mg via INTRAVENOUS
  Filled 2020-10-10: qty 100

## 2020-10-10 MED ORDER — SODIUM CHLORIDE 0.9 % IV SOLN: Freq: Once | INTRAVENOUS | Status: AC

## 2020-10-10 NOTE — Patient Instructions (Signed)

## 2020-10-11 ENCOUNTER — Other Ambulatory Visit: Payer: Self-pay | Admitting: Family Medicine

## 2020-10-11 ENCOUNTER — Ambulatory Visit
Admission: RE | Admit: 2020-10-11 | Discharge: 2020-10-11 | Disposition: A | Discharge status: Home / Self Care | Payer: Medicare Other | Source: Ambulatory Visit | Attending: Family Medicine | Admitting: Family Medicine

## 2020-10-11 DIAGNOSIS — R0781 Pleurodynia: Secondary | ICD-10-CM

## 2020-10-11 DIAGNOSIS — R918 Other nonspecific abnormal finding of lung field: Secondary | ICD-10-CM | POA: Diagnosis not present

## 2020-10-11 DIAGNOSIS — J984 Other disorders of lung: Secondary | ICD-10-CM | POA: Diagnosis not present

## 2020-10-11 DIAGNOSIS — I700 Atherosclerosis of aorta: Secondary | ICD-10-CM | POA: Diagnosis not present

## 2020-10-11 DIAGNOSIS — C50919 Malignant neoplasm of unspecified site of unspecified female breast: Secondary | ICD-10-CM | POA: Diagnosis not present

## 2020-10-17 ENCOUNTER — Other Ambulatory Visit: Payer: Self-pay | Admitting: Family Medicine

## 2020-10-17 DIAGNOSIS — R911 Solitary pulmonary nodule: Secondary | ICD-10-CM

## 2020-10-25 ENCOUNTER — Other Ambulatory Visit: Payer: Self-pay

## 2020-10-25 ENCOUNTER — Ambulatory Visit (INDEPENDENT_AMBULATORY_CARE_PROVIDER_SITE_OTHER): Payer: Medicare Other | Admitting: Cardiovascular Disease

## 2020-10-25 ENCOUNTER — Encounter: Payer: Self-pay | Admitting: Cardiovascular Disease

## 2020-10-25 VITALS — BP 130/60 | HR 64 | Ht 60.75 in | Wt 113.8 lb

## 2020-10-25 DIAGNOSIS — I700 Atherosclerosis of aorta: Secondary | ICD-10-CM

## 2020-10-25 DIAGNOSIS — E782 Mixed hyperlipidemia: Secondary | ICD-10-CM | POA: Diagnosis not present

## 2020-10-25 DIAGNOSIS — Z720 Tobacco use: Secondary | ICD-10-CM

## 2020-10-25 DIAGNOSIS — I10 Essential (primary) hypertension: Secondary | ICD-10-CM | POA: Diagnosis not present

## 2020-10-25 DIAGNOSIS — I739 Peripheral vascular disease, unspecified: Secondary | ICD-10-CM | POA: Diagnosis not present

## 2020-10-25 MED ORDER — ROSUVASTATIN CALCIUM 10 MG PO TABS: 10.0000 mg | ORAL_TABLET | Freq: Every day | ORAL | 1 refills | Status: DC

## 2020-10-25 NOTE — Assessment & Plan Note (Signed)
History of essential hypertension a blood pressure measured today 130/60.  She is on atenolol and chlorthalidone.

## 2020-10-25 NOTE — Assessment & Plan Note (Signed)
History of hyperlipidemia on low-dose Crestor with lipid profile performed 10/27/2019 revealing total cholesterol 189, LDL of 101 and HDL 72.  I am going to get a coronary calcium score to help determine how aggressive to be with lipid-lowering agents and we will increase her Crestor from 5 to 10 mg a day.  We will recheck a lipid liver profile in 3 months.

## 2020-10-25 NOTE — Patient Instructions (Signed)
Medication Instructions:   -Increase rosuvastatin (crestor) to 10mg  once daily.  *If you need a refill on your cardiac medications before your next appointment, please call your pharmacy*   Lab Work: Your physician recommends that you return for lab work in: 3 months for fasting lipid/liver profile.  If you have labs (blood work) drawn today and your tests are completely normal, you will receive your results only by: [place] (if you have MyChart) OR A paper copy in the mail If you have any lab test that is abnormal or we need to change your treatment, we will call you to review the results.   Testing/Procedures: Your physician has requested that you have a lower extremity arterial duplex. This test is an ultrasound of the arteries in the legs. It looks at arterial blood flow in the legs. Allow one hour for Lower Arterial scans. There are no restrictions or special instructions   Your physician has requested that you have an ankle brachial index (ABI). During this test an ultrasound and blood pressure cuff are used to evaluate the arteries that supply the arms and legs with blood. Allow thirty minutes for this exam. There are no restrictions or special instructions.  To be done in Jan 2023. These procedures are done at [place].   Dr. Gwenlyn Found has ordered a CT coronary calcium score. This test is done at [address]. This is $99 out of pocket.   Coronary CalciumScan A coronary calcium scan is an imaging test used to look for deposits of calcium and other fatty materials (plaques) in the inner lining of the blood vessels of the heart (coronary arteries). These deposits of calcium and plaques can partly clog and narrow the coronary arteries without producing any symptoms or warning signs. This puts a person at risk for a heart attack. This test can detect these deposits before symptoms develop. Tell a health care provider about: Any allergies you  have. All medicines you are taking, including vitamins, herbs, eye drops, creams, and over-the-counter medicines. Any problems you or family members have had with anesthetic medicines. Any blood disorders you have. Any surgeries you have had. Any medical conditions you have. Whether you are pregnant or may be pregnant. What are the risks? Generally, this is a safe procedure. However, problems may occur, including: Harm to a pregnant woman and her unborn baby. This test involves the use of radiation. Radiation exposure can be dangerous to a pregnant woman and her unborn baby. If you are pregnant, you generally should not have this procedure done. Slight increase in the risk of cancer. This is because of the radiation involved in the test. What happens before the procedure? No preparation is needed for this procedure. What happens during the procedure? You will undress and remove any jewelry around your neck or chest. You will put on a hospital gown. Sticky electrodes will be placed on your chest. The electrodes will be connected to an electrocardiogram (ECG) machine to record a tracing of the electrical activity of your heart. A CT scanner will take pictures of your heart. During this time, you will be asked to lie still and hold your breath for 2-3 seconds while a picture of your heart is being taken. The procedure may vary among health care providers and hospitals. What happens after the procedure? You can get dressed. You can return to your normal activities. It is up to you to get the results of your test. Ask your health  care provider, or the department that is doing the test, when your results will be ready. Summary A coronary calcium scan is an imaging test used to look for deposits of calcium and other fatty materials (plaques) in the inner lining of the blood vessels of the heart (coronary arteries). Generally, this is a safe procedure. Tell your health care provider if you are  pregnant or may be pregnant. No preparation is needed for this procedure. A CT scanner will take pictures of your heart. You can return to your normal activities after the scan is done. This information is not intended to replace advice given to you by your health care provider. Make sure you discuss any questions you have with your health care provider. Document Released: 07/06/2007 Document Revised: 11/27/2015 Document Reviewed: 11/27/2015 Elsevier Interactive Patient Education  2017 [place]: At [facility], you and your health needs are our priority.  As part of our continuing mission to provide you with exceptional heart care, we have created designated Provider Care Teams.  These Care Teams include your primary Cardiologist (physician) and Advanced Practice Providers (APPs -  Physician Assistants and Nurse Practitioners) who all work together to provide you with the care you need, when you need it.  We recommend signing up for the patient portal called "MyChart".  Sign up information is provided on this After Visit Summary.  MyChart is used to connect with patients for Virtual Visits (Telemedicine).  Patients are able to view lab/test results, encounter notes, upcoming appointments, etc.  Non-urgent messages can be sent to your provider as well.   To learn more about what you can do with MyChart, go to [email or web address].    Your next appointment:   12 month(s)  The format for your next appointment:   In Person  Provider:   Quay Burow, MD

## 2020-10-25 NOTE — Progress Notes (Signed)
10/25/2020 Leslie [name]   [birth year]  [id number]  Primary Physician Mayra Neer, MD Primary Cardiologist: Lorretta Harp MD [name], [place]  HPI:  Leslie [name] is a 75 y.o.   thin appearing widowed African-American female mother of 56, grandmother of one grandchild who is retired from being a Engineer, mining at [employer].  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 Jacqualyn Posey , her podiatrist, for peripheral vascular evaluation because of lifestyle limiting claudication.  I last saw her in the office 10/26/2019. Risk factors include treated hypertension and tobacco abuse is never had a heart attack or stroke.  She denies chest pain or shortness of breath.  She said lifestyle limiting claudication of the left side for last 3 months with recent Dopplers that were performed 06/02/2017 revealing a high-frequency signal in her distal left SFA.   I performed peripheral angiography on her 06/19/2017 revealing a 95% mid left SFA stenosis with one-vessel runoff.  I performed chocolate balloon angioplasty followed by drug-eluting balloon angioplasty with an excellent result.  Her follow-up Doppler studies performed 06/27/2017 normalized and her claudication has resolved.   Since I saw her a year ago she is done well.  She does continue to smoke 3 to 4 cigarettes a day.  She continues to take low-dose rosuvastatin 5 mg a day with an LDL has remained elevated in the 100 range.  She denies chest pain or shortness of breath.  She does denies left lower extremity claudication.  Doppler studies performed 02/02/2020 revealed a normal left ABI with a widely patent left SFA.     Current Meds  Medication Sig   acetaminophen (TYLENOL) 500 MG tablet Take 500-1,000 mg by mouth every 6 (six) hours as needed for moderate pain or headache.   anastrozole (ARIMIDEX) 1 MG tablet Take 1 tablet (1 mg total) by mouth daily.   atenolol-chlorthalidone (TENORETIC) 50-25 MG per tablet Take 0.5 tablets by mouth daily.     cholecalciferol (VITAMIN D) 400 units TABS tablet Take 400 Units by mouth daily.   Evening Primrose Oil 1000 MG CAPS Take 1,000 mg by mouth daily.   fluocinonide cream (LIDEX) 9.32 % Apply 1 application topically 2 (two) times daily as needed (for break outs).   folic acid (FOLVITE) 671 MCG tablet Take 400 mcg by mouth daily.    Multiple Vitamin (MULTIVITAMIN PO) Take 1 tablet by mouth daily.    palbociclib (IBRANCE) 125 MG tablet Take 1 tablet (125 mg total) by mouth daily. Take for 21 days on, 7 days off, repeat every 28 days.   rosuvastatin (CRESTOR) 5 MG tablet Take 5 mg by mouth daily.   triamcinolone cream (KENALOG) 0.1 % Apply topically.   vitamin E 400 UNIT capsule Take 400 Units by mouth daily.     Allergies  Allergen Reactions   Aspirin Nausea Only   Zyrtec [Cetirizine] Other (See Comments)    Chest Pain    Social History   Socioeconomic History   Marital status: Widowed    Spouse name: Not on file   Number of children: Not on file   Years of education: Not on file   Highest education level: Not on file  Occupational History   Not on file  Tobacco Use   Smoking status: Every Day    Packs/day: 0.50    Years: 45.00    Pack years: 22.50    Types: Cigarettes   Smokeless tobacco: Never  Vaping Use   Vaping Use: Never used  Substance and Sexual Activity   Alcohol use: Yes    Comment: 06/19/2017 "glass of wine 3-4 times/year"   Drug use: Never   Sexual activity: Not Currently    Birth control/protection: Post-menopausal, Surgical  Other Topics Concern   Not on file  Social History Narrative   Not on file   Social Determinants of Health   Financial Resource Strain: Not on file  Food Insecurity: Not on file  Transportation Needs: Not on file  Physical Activity: Not on file  Stress: Not on file  Social Connections: Not on file  Intimate Partner Violence: Not on file     Review of Systems: General: negative for chills, fever, night sweats or weight changes.   Cardiovascular: negative for chest pain, dyspnea on exertion, edema, orthopnea, palpitations, paroxysmal nocturnal dyspnea or shortness of breath Dermatological: negative for rash Respiratory: negative for cough or wheezing Urologic: negative for hematuria Abdominal: negative for nausea, vomiting, diarrhea, bright red blood per rectum, melena, or hematemesis Neurologic: negative for visual changes, syncope, or dizziness All other systems reviewed and are otherwise negative except as noted above.    Blood pressure 130/60, pulse 64, height 5' 0.75" (1.543 m), weight 113 lb 12.8 oz (51.6 kg), SpO2 99 %, unknown if currently breastfeeding.  General appearance: alert and no distress Neck: no adenopathy, no carotid bruit, no JVD, supple, symmetrical, trachea midline, and thyroid not enlarged, symmetric, no tenderness/mass/nodules Lungs: clear to auscultation bilaterally Heart: regular rate and rhythm, S1, S2 normal, no murmur, click, rub or gallop Extremities: extremities normal, atraumatic, no cyanosis or edema Pulses: 2+ and symmetric Skin: Skin color, texture, turgor normal. No rashes or lesions Neurologic: Grossly normal  EKG sinus rhythm at 64 with nonspecific ST and T wave changes.  I personally reviewed this EKG.  ASSESSMENT AND PLAN:   Essential hypertension History of essential hypertension a blood pressure measured today 130/60.  She is on atenolol and chlorthalidone.  Peripheral arterial disease ([place]) History of peripheral arterial disease status post left SFA intervention by myself for a focal high-grade lesion 06/19/2017.  She had shock of balloon angioplasty followed by drug-coated balloon angioplasty.  Her Dopplers normalized and her claudication resolved.  Her most recent Doppler studies performed 02/02/2020 revealed normal ABIs bilaterally with a widely patent left SFA.  We will repeat her Doppler studies in January of next year.  Tobacco abuse Ongoing tobacco abuse with 3  to 4 cigarettes a day recalcitrant to risk factor modification.  Hyperlipidemia History of hyperlipidemia on low-dose Crestor with lipid profile performed 10/27/2019 revealing total cholesterol 189, LDL of 101 and HDL 72.  I am going to get a coronary calcium score to help determine how aggressive to be with lipid-lowering agents and we will increase her Crestor from 5 to 10 mg a day.  We will recheck a lipid liver profile in 3 months.     Lorretta Harp MD FACP,FACC,FAHA, [facility] 10/25/2020 11:57 AM

## 2020-10-25 NOTE — Assessment & Plan Note (Signed)
Ongoing tobacco abuse with 3 to 4 cigarettes a day recalcitrant to risk factor modification.

## 2020-10-25 NOTE — Assessment & Plan Note (Signed)
History of peripheral arterial disease status post left SFA intervention by myself for a focal high-grade lesion 06/19/2017.  She had shock of balloon angioplasty followed by drug-coated balloon angioplasty.  Her Dopplers normalized and her claudication resolved.  Her most recent Doppler studies performed 02/02/2020 revealed normal ABIs bilaterally with a widely patent left SFA.  We will repeat her Doppler studies in January of next year.

## 2020-11-06 DIAGNOSIS — C50919 Malignant neoplasm of unspecified site of unspecified female breast: Secondary | ICD-10-CM | POA: Diagnosis not present

## 2020-11-06 DIAGNOSIS — Z720 Tobacco use: Secondary | ICD-10-CM | POA: Diagnosis not present

## 2020-11-06 DIAGNOSIS — I739 Peripheral vascular disease, unspecified: Secondary | ICD-10-CM | POA: Diagnosis not present

## 2020-11-06 DIAGNOSIS — C7951 Secondary malignant neoplasm of bone: Secondary | ICD-10-CM | POA: Diagnosis not present

## 2020-11-06 DIAGNOSIS — E46 Unspecified protein-calorie malnutrition: Secondary | ICD-10-CM | POA: Diagnosis not present

## 2020-11-06 DIAGNOSIS — Z0000 Encounter for general adult medical examination without abnormal findings: Secondary | ICD-10-CM | POA: Diagnosis not present

## 2020-11-06 DIAGNOSIS — J301 Allergic rhinitis due to pollen: Secondary | ICD-10-CM | POA: Diagnosis not present

## 2020-11-06 DIAGNOSIS — I10 Essential (primary) hypertension: Secondary | ICD-10-CM | POA: Diagnosis not present

## 2020-11-06 DIAGNOSIS — R7301 Impaired fasting glucose: Secondary | ICD-10-CM | POA: Diagnosis not present

## 2020-11-08 ENCOUNTER — Other Ambulatory Visit: Payer: Self-pay | Admitting: *Deleted

## 2020-11-08 DIAGNOSIS — C50412 Malignant neoplasm of upper-outer quadrant of left female breast: Secondary | ICD-10-CM

## 2020-11-08 NOTE — Progress Notes (Signed)
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   She continues on anastrozole.  She has significant hot flashes and mild vaginal dryness from this.  She is not using a lubricant at present..  She was also started on zolendronate on 11/09/2019.  Her most recent dose was 10/10/2020 and this is being repeated every 12 weeks.  She is tolerating this well.  She started palbociclib on 12/02/2019.  She says this takes her appetite away.  Her weight dropped from 118 pounds February 2022 212 pounds September 2022 but is up a pound today.  Her most recent staging studies were a CT of the chest and bone scan obtained mid May 2022.  This showed stable bone lesions and no evidence of visceral disease  REVIEW OF SYSTEMS: Leslie [name] has pain in the right upper quadrant or right flank.  Dr. Brigitte Pulse prescribed Lidoderm patches and those are working well for her.  She also prescribed Percocet but the patient has not taken that because she does not like pain medicine she says.  Dr. Brigitte Pulse prescribed Megestrol for her appetite but when [name] that this can cause blood clots she really does not want to take it.  This is discussed further below.  She uses her treadmill for exercise.  She takes docusate for her mild constipation.  A detailed review of systems today was otherwise  stable.   COVID 19 VACCINATION STATUS: Status post Moderna x2 with booster x2   HISTORY OF CURRENT ILLNESS: From [name] he patient evaluation note dated 12/07/2008:   "This is a pleasant 75 year old woman from [facility] referred by Dr. [name] for evaluation and treatment of breast cancer. This woman has been in relatively good health.  She undergoes annual screening mammography.  Her husband is a patient of Dr. [name] who has lung cancer.  She had a screening mammogram on 11/11/2008, which showed an abnormality.  A follow up diagnostic mammogram and right breast ultrasound was performed.  This showed a hyperdense spiculated mass 12 o'clock left breast measuring 5 x 3 x 1.5 cm.  There is an associated abnormality seen on ultrasound as well.  Biopsy was recommended with BSGI scan performed 11/23/2008 showed an uptake with a 3 cm of intense isotope activity.  An ultrasound-guided biopsy of both the breast mass in the left breast as well as left axilla was performed on 11/23/2008, both of which show invasive mammary carcinoma.  Prognostic panel showed this to be ER positive at 99%, PR positive at 7%, proliferative index 17%, HER-2 was negative with ratio of 1.2.  An E-cadherin test was performed, which was negative suggestive of lobular phenotype.  The patient has subsequently been referred for a neoadjuvant therapy.  Of note is she had an MRI scan on 12/01/2008, which showed a 4.9 x 3.5 x 2.9 cm area of lobular carcinoma 12 o'clock position left breast.  Axillary lymph nodes were also seen measuring 1.6 x 1.1  cm.  Multiple right breast cysts were also seen as well."   The patient's subsequent history is as detailed below.   PAST MEDICAL HISTORY: Past Medical History:  Diagnosis Date   Benign breast cyst in female    Right breast   Cancer of left breast ([place]) 2010   Depression    History of blood transfusion    "related to tubal pregnancy"   Hypertension    PAD (peripheral artery  disease) ([place])     PAST SURGICAL HISTORY: Past Surgical History:  Procedure Laterality Date   ABDOMINAL HYSTERECTOMY  1980s   BREAST CYST EXCISION Right ~ 2005   BREAST LUMPECTOMY Left 11/23/2008   ECTOPIC PREGNANCY SURGERY  1966   LOWER EXTREMITY ANGIOGRAM  06/19/2017   LOWER EXTREMITY ANGIOGRAPHY N/A 06/19/2017   Procedure: LOWER EXTREMITY ANGIOGRAPHY;  Surgeon: Lorretta Harp, MD;  Location: [facility];  Service: Cardiovascular;  Laterality: N/A;   PERIPHERAL VASCULAR BALLOON ANGIOPLASTY Left 06/19/2017   SFA    PERIPHERAL VASCULAR BALLOON ANGIOPLASTY  06/19/2017   Procedure: PERIPHERAL VASCULAR BALLOON ANGIOPLASTY;  Surgeon: Lorretta Harp, MD;  Location: [facility];  Service: Cardiovascular;;  SFA left  hysterectomy in her 67s secondary to tubal pregnancy, ovaries were intact   FAMILY HISTORY: Family History  Problem Relation Age of Onset   Hypertension Mother    Seizures Mother    Heart disease Father    Hypertension Sister    No history of breast or ovarian cancer in the family.  Father had a history of bad heart; mother died at age 31.    The patient has five sisters, two brothers, all of whom in good health.   GYNECOLOGIC HISTORY:  No LMP recorded. Patient has had a hysterectomy. Menarche: 75 years old Age at first live birth:    years old Leslie [name] 4 HRT yes  Hysterectomy? yes BSO? no   SOCIAL HISTORY: (updated 10/2019)  [name] "Leslie [name]" retired from working as a Engineer, mining for [employer]. She is widowed. Her husband of 46 years had a history of lung cancer. She lives by herself with no pets.  She has three grown sons that live in [place], [place], and [place].  The patient has 1 granddaughter, age 14, who is attending college studying to be an Chief Financial Officer.  The patient attends a [employer]    ADVANCED DIRECTIVES: not in place; the patient plans to name her son [name], who lives in [place] (and works in  Pharmacist, hospital) as her healthcare power of attorney.  He can be reached at [phone]   HEALTH MAINTENANCE: Social History   Tobacco Use   Smoking status: Every Day    Packs/day: 0.50    Years: 45.00    Pack years: 22.50    Types: Cigarettes   Smokeless tobacco: Never  Vaping Use   Vaping Use: Never used  Substance Use Topics   Alcohol use: Yes    Comment: 06/19/2017 "glass of wine 3-4 times/year"   Drug use: Never     Colonoscopy: none on file  PAP: 2012?  Bone density:    Allergies  Allergen Reactions   Aspirin Nausea Only   Zyrtec [Cetirizine] Other (See Comments)    Chest Pain    Current Outpatient Medications  Medication Sig Dispense Refill   lidocaine (LIDODERM) 5 % Place 1 patch onto the skin daily. Remove & Discard patch within 12 hours or as directed by MD 30 patch 0  mirtazapine (REMERON) 7.5 MG tablet Take 1 tablet (7.5 mg total) by mouth at bedtime. 90 tablet 4   ondansetron (ZOFRAN) 8 MG tablet Take by mouth every 8 (eight) hours as needed for nausea or vomiting. 20 tablet 0   acetaminophen (TYLENOL) 500 MG tablet Take 500-1,000 mg by mouth every 6 (six) hours as needed for moderate pain or headache.     anastrozole (ARIMIDEX) 1 MG tablet Take 1 tablet (1 mg total) by mouth daily. 90 tablet 4   atenolol-chlorthalidone (TENORETIC) 50-25 MG per tablet Take 0.5 tablets by mouth daily.      cholecalciferol (VITAMIN D) 400 units TABS tablet Take 400 Units by mouth daily.     Evening Primrose Oil 1000 MG CAPS Take 1,000 mg by mouth daily.     fluocinonide cream (LIDEX) 7.65 % Apply 1 application topically 2 (two) times daily as needed (for break outs).     folic acid (FOLVITE) 465 MCG tablet Take 400 mcg by mouth daily.      gabapentin (NEURONTIN) 300 MG capsule Take 1 capsule (300 mg total) by mouth at bedtime. (Patient not taking: Reported on 10/25/2020) 90 capsule 4   Multiple Vitamin (MULTIVITAMIN PO) Take 1 tablet by mouth daily.      palbociclib (IBRANCE)  125 MG tablet Take 1 tablet (125 mg total) by mouth daily. Take for 21 days on, 7 days off, repeat every 28 days. 21 tablet 6   rosuvastatin (CRESTOR) 10 MG tablet Take 1 tablet (10 mg total) by mouth daily. 90 tablet 1   triamcinolone cream (KENALOG) 0.1 % Apply topically.     venlafaxine XR (EFFEXOR-XR) 37.5 MG 24 hr capsule Take 1 capsule (37.5 mg total) by mouth daily with breakfast. (Patient not taking: Reported on 10/25/2020) 90 capsule 4   vitamin E 400 UNIT capsule Take 400 Units by mouth daily.     No current facility-administered medications for this visit.    OBJECTIVE: African-American woman in no acute distress  Vitals:   11/09/20 1414  BP: (!) 126/59  Pulse: 93  Resp: 18  Temp: (!) 97.5 F (36.4 C)  SpO2: 99%      Body mass index is 21.09 kg/m.   Wt Readings from Last 3 Encounters:  11/09/20 110 lb 11.2 oz (50.2 kg)  10/25/20 113 lb 12.8 oz (51.6 kg)  10/10/20 112 lb 12 oz (51.1 kg)     ECOG FS:1 - Symptomatic but completely ambulatory  Sclerae unicteric, EOMs intact Wearing a mask No cervical or supraclavicular adenopathy Lungs no rales or rhonchi Heart regular rate and rhythm Abd soft, nontender, positive bowel sounds MSK no focal spinal tenderness, no upper extremity lymphedema Neuro: nonfocal, well oriented, appropriate affect Breasts: The right breast is benign.  The left breast is status postlumpectomy and radiation.  There is no evidence of local recurrence.  Both axillae are benign.   LAB RESULTS:  CMP     Component Value Date/Time   NA 135 10/10/2020 1359   NA 145 (H) 06/04/2017 1513   NA 141 02/09/2013 1056   K 3.0 (L) 10/10/2020 1359   K 3.4 (L) 02/09/2013 1056   CL 97 (L) 10/10/2020 1359   CL 102 06/03/2012 1106   CO2 27 10/10/2020 1359   CO2 27 02/09/2013 1056   GLUCOSE 145 (H) 10/10/2020 1359   GLUCOSE 134 02/09/2013 1056   GLUCOSE 101 (H) 06/03/2012 1106   BUN 16 10/10/2020 1359   BUN 11 06/04/2017 1513   BUN 11.0  02/09/2013 1056    CREATININE 1.36 (H) 10/10/2020 1359   CREATININE 0.77 11/19/2019 0929   CREATININE 0.9 02/09/2013 1056   CALCIUM 11.9 (H) 10/10/2020 1359   CALCIUM 9.8 02/09/2013 1056   PROT 7.6 10/10/2020 1359   PROT 7.2 10/06/2018 1005   PROT 7.4 02/09/2013 1056   ALBUMIN 3.4 (L) 10/10/2020 1359   ALBUMIN 4.4 10/06/2018 1005   ALBUMIN 4.0 02/09/2013 1056   AST 34 10/10/2020 1359   AST 20 11/19/2019 0929   AST 19 02/09/2013 1056   ALT 36 10/10/2020 1359   ALT 25 11/19/2019 0929   ALT 20 02/09/2013 1056   ALKPHOS 66 10/10/2020 1359   ALKPHOS 68 02/09/2013 1056   BILITOT 1.0 10/10/2020 1359   BILITOT 0.4 11/19/2019 0929   BILITOT 0.49 02/09/2013 1056   GFRNONAA 41 (L) 10/10/2020 1359   GFRNONAA >60 11/19/2019 0929   GFRAA >60 06/20/2017 0249    Lab Results  Component Value Date   TOTALPROTELP 7.5 11/03/2019   ALBUMINELP 4.0 11/03/2019   A1GS 0.2 11/03/2019   A2GS 0.8 11/03/2019   BETS 1.1 11/03/2019   GAMS 1.4 11/03/2019   MSPIKE Not Observed 11/03/2019   SPEI Comment 11/03/2019    Lab Results  Component Value Date   WBC 4.4 11/09/2020   NEUTROABS PENDING 11/09/2020   HGB 8.8 (L) 11/09/2020   HCT 26.1 (L) 11/09/2020   MCV 108.8 (H) 11/09/2020   PLT 319 11/09/2020    Lab Results  Component Value Date   LABCA2 33 12/05/2011    No components found for: [id number]  No results for input(s): INR in the last 168 hours.  Lab Results  Component Value Date   LABCA2 33 12/05/2011    No results found for: CAN199  No results found for: CAN125  No results found for: TLX726  Lab Results  Component Value Date   CA2729 41.3 (H) 11/03/2019    No components found for: HGQUANT  Lab Results  Component Value Date   CEA1 2.33 11/03/2019   /  CEA ([facility])  Date Value Ref Range Status  11/03/2019 2.33 0.00 - 5.00 ng/mL Final    Comment:    (NOTE) This test was performed using Architect's Chemiluminescent Microparticle Immunoassay. Values obtained from different  assay methods cannot be used interchangeably. Please note that 5-10% of patients who smoke may see CEA levels up to 6.9 ng/mL. Performed at [facility], [place] [address], [place], [place] [address]      No results found for: AFPTUMOR  No results found for: [facility]  Lab Results  Component Value Date   KPAFRELGTCHN 25.8 (H) 11/03/2019   LAMBDASER 17.7 11/03/2019   KAPLAMBRATIO 1.46 11/03/2019   (kappa/lambda light chains)  No results found for: HGBA, HGBA2QUANT, HGBFQUANT, HGBSQUAN (Hemoglobinopathy evaluation)   Lab Results  Component Value Date   LDH 182 06/03/2012    No results found for: IRON, TIBC, IRONPCTSAT (Iron and TIBC)  No results found for: FERRITIN  Urinalysis    Component Value Date/Time   COLORURINE YELLOW 08/17/2010 [place] 08/17/2010 1105   LABSPEC 1.017 08/17/2010 1105   PHURINE 8.0 08/17/2010 1105   GLUCOSEU NEGATIVE 08/17/2010 1105   HGBUR NEGATIVE 08/17/2010 1105   BILIRUBINUR NEGATIVE 08/17/2010 1105   KETONESUR NEGATIVE 08/17/2010 1105   PROTEINUR NEGATIVE 08/17/2010 1105   UROBILINOGEN 0.2 08/17/2010 1105   NITRITE NEGATIVE 08/17/2010 1105   LEUKOCYTESUR NEGATIVE 08/17/2010 1105    STUDIES: DG Chest 2  View  Addendum Date: 10/16/2020   ADDENDUM REPORT: 10/16/2020 12:05 ADDENDUM: Per request from the referring physician, the examination was also compared to prior chest CT 11/05/2019. This chest CT does demonstrate some linear scarring in the left lower lobe and some mild subpleural reticulation in the anterior aspect of the left upper lobe deep to the left breast (which likely reflects some chronic postradiation changes). These findings on the prior chest CT do not definitively account for the perceived abnormality on the recent chest x-ray, and as previously recommended, follow-up chest CT is strongly recommended at this time. Electronically Signed   By: Vinnie Langton M.D.   On: 10/16/2020 12:05    Result Date: 10/16/2020 CLINICAL DATA:  75 year old female with history of right-sided chest pain. EXAM: CHEST - 2 VIEW COMPARISON:  Chest x-ray 09/08/2016. FINDINGS: Ill-defined somewhat nodular opacity in the left mid lung noted on the frontal projection, not confidently identified on the lateral view. Right lung is clear. Blunting of the costophrenic sulci bilaterally, suggestive of trace bilateral pleural effusions. Elevation of the left hemidiaphragm, similar to the prior study. No pneumothorax. No evidence of pulmonary edema. Heart size is normal. Upper mediastinal contours are within normal limits. Atherosclerotic calcifications in the thoracic aorta. Surgical clips in the left axilla from prior lymph node dissection. IMPRESSION: 1. New nodular density projecting over the left mid lung on the frontal view, not confidently identified on the lateral view. This is of uncertain etiology and significance, but further evaluation with nonemergent chest CT is recommended in the near future to better evaluate this finding and exclude the possibility of a pulmonary nodule. 2. Blunting of the costophrenic sulci bilaterally which may suggest chronic pleuroparenchymal scarring or trace bilateral pleural effusions. 3. Aortic atherosclerosis. These results will be called to the ordering clinician or representative by the Radiologist Assistant, and communication documented in the PACS or [employer]. Electronically Signed: By: Vinnie Langton M.D. On: 10/12/2020 09:47     ELIGIBLE FOR AVAILABLE RESEARCH PROTOCOL: no  ASSESSMENT: 75 y.o. [place] woman  1.  Status post left breast upper outer quadrant biopsy and left axillary needle core biopsy on 11/23/2008 for clinical T3 N1, stage IIA invasive lobular carcinoma, E-cadherin negative, estrogen and progesterone receptor positive, HER-2 not amplified, with an MIB-1 of 17% Ki-67 17%   2.    On neoadjuvant letrozole started in 12/2008, interrupted at the  time of her definitive surgery, then resumed post-op   4.  Status post left breast lumpectomy with regional lymph node resection 07/12/2009 for a stage IIA, pT1c pN1, invasive lobular carcinoma,with angiolymphatic invasion identified  (a) 2 of 8 removed left nodes were involved   (b) repeat prognostic panel again estrogen receptor positive, not progesterone receptor negative, with an MIB-1 of 74% and HER-2 not amplified   (c) additional surgery for margin clearance 08/04/2009 showed only atypical lobular hyperplasia   5. Oncotype score of 20 predicts a 5-year rate of recurrence outside the breast with antiestrogens as the only systemic therapy of 13%.  It also predicts no significant benefit from chemotherapy.    6.  status post radiation therapy 09/26/2009-11/11/18  7.  letrozole continued through December 2015 (5 years)  METASTATIC DISEASE: September 2021 8.  Lumbar MRI 10/04/2019 suggests metastatic disease to bone  (a) CT of the chest and bone scan 11/05/2019 confirm multiple lytic and sclerotic bone lesions, but no evidence of visceral disease  (b) bone marrow biopsy 11/19/2019 confirms metastatic carcinoma, estrogen receptor positive and gross  cystic disease fluid protein positive, progesterone receptor negative  (c) CA 27-29 and CEA are not informative (CA 27-29 is 41.3 on 11/03/2019)  9.  zoledronate started 11/10/2019, repeat every 12 weeks  10.  anastrozole started 11/05/2019  (a) palbociclib 125 mg daily, 21/7, started 12/02/2019  11. Restaging studies:  (A) chest CT with contrast 06/04/2020 shows no visceral disease, unchanged bony lesions  (B) bone scan 06/04/2020 shows no change compared to 03/09/2020   PLAN: Stevi Hollinshead is a little over a year out from definitive diagnosis of metastatic breast cancer.  Her disease appears to be well controlled on her current medications and she tolerates the anastrozole and palbociclib generally well.  She does have pain in the right  flank or right upper quadrant area.  This is very and constant.  It is being helped by the Lidoderm just prescribed by Dr.Shaw.  We are going to be obtaining restaging studies soon, bone scan and chest CT and that will give [place] more information regarding that.  She was also prescribed megestrol.  This is an issue because she is on anastrozole which works by blocking estrogen production in the body.  Progesterones are a relative contraindication although not an absolute 1.  What bothered Sheri may though was the possibility of blood clots on that medication.  She would prefer not to take it.  Instead we are going to start with Remeron at 7.5 mg at bedtime.  In a few weeks if she has not had some improvement in her appetite she will let me know and we will increase the dose to 15 mg.  She will have her next zoledronate in 8 weeks and she will see me at that time  She knows to call for any other issue that may develop before the next visit  Total encounter time 35 minutes.Sarajane Jews C. Keelie Zemanek, MD 11/09/2020 2:39 PM Medical Oncology and Hematology [facility] [place], [place] [address] Tel. [phone]    Fax. [phone]   This document serves as a record of services personally performed by Lurline Del, MD. It was created on his behalf by Wilburn Mylar, a trained medical scribe. The creation of this record is based on the scribe's personal observations and the provider's statements to them.   I, Lurline Del MD, have reviewed the above documentation for accuracy and completeness, and I agree with the above.   *Total Encounter Time as defined by the Centers for Medicare and Medicaid Services includes, in addition to the face-to-face time of a patient visit (documented in the note above) non-face-to-face time: obtaining and reviewing outside history, ordering and reviewing medications, tests or procedures, care coordination (communications with other health  care professionals or caregivers) and documentation in the medical record.

## 2020-11-09 ENCOUNTER — Other Ambulatory Visit: Payer: Self-pay

## 2020-11-09 ENCOUNTER — Inpatient Hospital Stay: Payer: Medicare Other

## 2020-11-09 ENCOUNTER — Inpatient Hospital Stay: Payer: Medicare Other | Attending: Oncology | Admitting: Oncology

## 2020-11-09 VITALS — BP 126/59 | HR 93 | Temp 97.5°F | Resp 18 | Ht 60.75 in | Wt 110.7 lb

## 2020-11-09 DIAGNOSIS — Z170 Estrogen receptor positive status [ER+]: Secondary | ICD-10-CM | POA: Insufficient documentation

## 2020-11-09 DIAGNOSIS — F17210 Nicotine dependence, cigarettes, uncomplicated: Secondary | ICD-10-CM | POA: Insufficient documentation

## 2020-11-09 DIAGNOSIS — Z923 Personal history of irradiation: Secondary | ICD-10-CM | POA: Diagnosis not present

## 2020-11-09 DIAGNOSIS — I700 Atherosclerosis of aorta: Secondary | ICD-10-CM | POA: Insufficient documentation

## 2020-11-09 DIAGNOSIS — I10 Essential (primary) hypertension: Secondary | ICD-10-CM | POA: Insufficient documentation

## 2020-11-09 DIAGNOSIS — Z8249 Family history of ischemic heart disease and other diseases of the circulatory system: Secondary | ICD-10-CM | POA: Diagnosis not present

## 2020-11-09 DIAGNOSIS — C7951 Secondary malignant neoplasm of bone: Secondary | ICD-10-CM | POA: Insufficient documentation

## 2020-11-09 DIAGNOSIS — N6002 Solitary cyst of left breast: Secondary | ICD-10-CM | POA: Diagnosis not present

## 2020-11-09 DIAGNOSIS — Z886 Allergy status to analgesic agent status: Secondary | ICD-10-CM | POA: Insufficient documentation

## 2020-11-09 DIAGNOSIS — Z820 Family history of epilepsy and other diseases of the nervous system: Secondary | ICD-10-CM | POA: Insufficient documentation

## 2020-11-09 DIAGNOSIS — C50412 Malignant neoplasm of upper-outer quadrant of left female breast: Secondary | ICD-10-CM | POA: Insufficient documentation

## 2020-11-09 DIAGNOSIS — K5900 Constipation, unspecified: Secondary | ICD-10-CM | POA: Insufficient documentation

## 2020-11-09 DIAGNOSIS — I739 Peripheral vascular disease, unspecified: Secondary | ICD-10-CM | POA: Insufficient documentation

## 2020-11-09 DIAGNOSIS — Z79811 Long term (current) use of aromatase inhibitors: Secondary | ICD-10-CM | POA: Diagnosis not present

## 2020-11-09 DIAGNOSIS — N6001 Solitary cyst of right breast: Secondary | ICD-10-CM | POA: Diagnosis not present

## 2020-11-09 DIAGNOSIS — R232 Flushing: Secondary | ICD-10-CM | POA: Insufficient documentation

## 2020-11-09 DIAGNOSIS — J984 Other disorders of lung: Secondary | ICD-10-CM | POA: Insufficient documentation

## 2020-11-09 LAB — CMP (CANCER CENTER ONLY)
ALT: 37 U/L (ref 0–44)
AST: 37 U/L (ref 15–41)
Albumin: 4 g/dL (ref 3.5–5.0)
Alkaline Phosphatase: 71 U/L (ref 38–126)
Anion gap: 9 (ref 5–15)
BUN: 17 mg/dL (ref 8–23)
CO2: 27 mmol/L (ref 22–32)
Calcium: 9.5 mg/dL (ref 8.9–10.3)
Chloride: 99 mmol/L (ref 98–111)
Creatinine: 1.03 mg/dL — ABNORMAL HIGH (ref 0.44–1.00)
GFR, Estimated: 57 mL/min — ABNORMAL LOW (ref >60)
Glucose, Bld: 127 mg/dL — ABNORMAL HIGH (ref 70–99)
Potassium: 3.2 mmol/L — ABNORMAL LOW (ref 3.5–5.1)
Sodium: 135 mmol/L (ref 135–145)
Total Bilirubin: 0.6 mg/dL (ref 0.3–1.2)
Total Protein: 8 g/dL (ref 6.5–8.1)

## 2020-11-09 LAB — CBC WITH DIFFERENTIAL (CANCER CENTER ONLY)
Abs Immature Granulocytes: 0.02 10*3/uL (ref 0.00–0.07)
Basophils Absolute: 0 10*3/uL (ref 0.0–0.1)
Basophils Relative: 0 %
Eosinophils Absolute: 0 10*3/uL (ref 0.0–0.5)
Eosinophils Relative: 1 %
HCT: 26.1 % — ABNORMAL LOW (ref 36.0–46.0)
Hemoglobin: 8.8 g/dL — ABNORMAL LOW (ref 12.0–15.0)
Immature Granulocytes: 1 %
Lymphocytes Relative: 36 %
Lymphs Abs: 1.6 10*3/uL (ref 0.7–4.0)
MCH: 36.7 pg — ABNORMAL HIGH (ref 26.0–34.0)
MCHC: 33.7 g/dL (ref 30.0–36.0)
MCV: 108.8 fL — ABNORMAL HIGH (ref 80.0–100.0)
Monocytes Absolute: 0.4 10*3/uL (ref 0.1–1.0)
Monocytes Relative: 10 %
Neutro Abs: 2.3 10*3/uL (ref 1.7–7.7)
Neutrophils Relative %: 52 %
Platelet Count: 319 10*3/uL (ref 150–400)
RBC: 2.4 MIL/uL — ABNORMAL LOW (ref 3.87–5.11)
RDW: 16.5 % — ABNORMAL HIGH (ref 11.5–15.5)
Smear Review: NORMAL
WBC Count: 4.4 10*3/uL (ref 4.0–10.5)
nRBC: 0 % (ref 0.0–0.2)

## 2020-11-09 MED ORDER — MIRTAZAPINE 7.5 MG PO TABS: 7.5000 mg | ORAL_TABLET | Freq: Every day | ORAL | 4 refills | Status: DC

## 2020-11-15 ENCOUNTER — Inpatient Hospital Stay (HOSPITAL_COMMUNITY): Admission: RE | Admit: 2020-11-15 | Payer: Medicare Other | Source: Ambulatory Visit

## 2020-11-21 ENCOUNTER — Telehealth: Payer: Self-pay

## 2020-11-21 NOTE — Telephone Encounter (Signed)
Oral Oncology Patient Advocate Encounter  Met patient in [place] to complete re-enrollment application for [facility] in an effort to reduce patient's out of pocket expense for [place] to $0.    Application completed and faxed to [phone].   [employer] patient assistance phone number for follow up is [phone].   This encounter will be updated until final determination.   [place] [place] Phone [phone] Fax [phone] 11/21/2020 3:56 PM

## 2020-11-28 DIAGNOSIS — Z23 Encounter for immunization: Secondary | ICD-10-CM | POA: Diagnosis not present

## 2020-12-07 ENCOUNTER — Other Ambulatory Visit: Payer: Self-pay

## 2020-12-07 ENCOUNTER — Inpatient Hospital Stay: Payer: Medicare Other | Attending: Oncology

## 2020-12-07 DIAGNOSIS — C50412 Malignant neoplasm of upper-outer quadrant of left female breast: Secondary | ICD-10-CM | POA: Insufficient documentation

## 2020-12-07 DIAGNOSIS — Z79811 Long term (current) use of aromatase inhibitors: Secondary | ICD-10-CM | POA: Diagnosis not present

## 2020-12-07 DIAGNOSIS — C7951 Secondary malignant neoplasm of bone: Secondary | ICD-10-CM | POA: Insufficient documentation

## 2020-12-07 DIAGNOSIS — Z170 Estrogen receptor positive status [ER+]: Secondary | ICD-10-CM | POA: Insufficient documentation

## 2020-12-07 LAB — CBC WITH DIFFERENTIAL (CANCER CENTER ONLY)
Abs Immature Granulocytes: 0.03 10*3/uL (ref 0.00–0.07)
Basophils Absolute: 0 10*3/uL (ref 0.0–0.1)
Basophils Relative: 1 %
Eosinophils Absolute: 0.1 10*3/uL (ref 0.0–0.5)
Eosinophils Relative: 2 %
HCT: 23.4 % — ABNORMAL LOW (ref 36.0–46.0)
Hemoglobin: 8.1 g/dL — ABNORMAL LOW (ref 12.0–15.0)
Immature Granulocytes: 1 %
Lymphocytes Relative: 36 %
Lymphs Abs: 1.6 10*3/uL (ref 0.7–4.0)
MCH: 38 pg — ABNORMAL HIGH (ref 26.0–34.0)
MCHC: 34.6 g/dL (ref 30.0–36.0)
MCV: 109.9 fL — ABNORMAL HIGH (ref 80.0–100.0)
Monocytes Absolute: 0.4 10*3/uL (ref 0.1–1.0)
Monocytes Relative: 8 %
Neutro Abs: 2.3 10*3/uL (ref 1.7–7.7)
Neutrophils Relative %: 52 %
Platelet Count: 350 10*3/uL (ref 150–400)
RBC: 2.13 MIL/uL — ABNORMAL LOW (ref 3.87–5.11)
RDW: 17.8 % — ABNORMAL HIGH (ref 11.5–15.5)
Smear Review: NORMAL
WBC Count: 4.4 10*3/uL (ref 4.0–10.5)
nRBC: 1.4 % — ABNORMAL HIGH (ref 0.0–0.2)

## 2020-12-07 LAB — CMP (CANCER CENTER ONLY)
ALT: 34 U/L (ref 0–44)
AST: 36 U/L (ref 15–41)
Albumin: 3.5 g/dL (ref 3.5–5.0)
Alkaline Phosphatase: 78 U/L (ref 38–126)
Anion gap: 9 (ref 5–15)
BUN: 7 mg/dL — ABNORMAL LOW (ref 8–23)
CO2: 26 mmol/L (ref 22–32)
Calcium: 9.1 mg/dL (ref 8.9–10.3)
Chloride: 107 mmol/L (ref 98–111)
Creatinine: 1.12 mg/dL — ABNORMAL HIGH (ref 0.44–1.00)
GFR, Estimated: 52 mL/min — ABNORMAL LOW (ref >60)
Glucose, Bld: 108 mg/dL — ABNORMAL HIGH (ref 70–99)
Potassium: 3.5 mmol/L (ref 3.5–5.1)
Sodium: 142 mmol/L (ref 135–145)
Total Bilirubin: 0.3 mg/dL (ref 0.3–1.2)
Total Protein: 7.1 g/dL (ref 6.5–8.1)

## 2020-12-12 ENCOUNTER — Other Ambulatory Visit: Payer: Self-pay

## 2020-12-12 DIAGNOSIS — Z170 Estrogen receptor positive status [ER+]: Secondary | ICD-10-CM

## 2020-12-12 DIAGNOSIS — C50412 Malignant neoplasm of upper-outer quadrant of left female breast: Secondary | ICD-10-CM

## 2020-12-12 MED ORDER — ANASTROZOLE 1 MG PO TABS: 1.0000 mg | ORAL_TABLET | Freq: Every day | ORAL | 4 refills | Status: AC

## 2020-12-13 ENCOUNTER — Other Ambulatory Visit: Payer: Self-pay

## 2021-01-03 ENCOUNTER — Other Ambulatory Visit: Payer: Self-pay

## 2021-01-03 ENCOUNTER — Encounter (HOSPITAL_COMMUNITY)
Admission: RE | Admit: 2021-01-03 | Discharge: 2021-01-03 | Disposition: A | Discharge status: Home / Self Care | Payer: Medicare Other | Source: Ambulatory Visit | Attending: Oncology | Admitting: Oncology

## 2021-01-03 ENCOUNTER — Ambulatory Visit (HOSPITAL_COMMUNITY)
Admission: RE | Admit: 2021-01-03 | Discharge: 2021-01-03 | Disposition: A | Discharge status: Home / Self Care | Payer: Medicare Other | Source: Ambulatory Visit | Attending: Oncology | Admitting: Oncology

## 2021-01-03 DIAGNOSIS — C50412 Malignant neoplasm of upper-outer quadrant of left female breast: Secondary | ICD-10-CM

## 2021-01-03 DIAGNOSIS — J90 Pleural effusion, not elsewhere classified: Secondary | ICD-10-CM | POA: Diagnosis not present

## 2021-01-03 DIAGNOSIS — C7951 Secondary malignant neoplasm of bone: Secondary | ICD-10-CM | POA: Diagnosis not present

## 2021-01-03 DIAGNOSIS — I700 Atherosclerosis of aorta: Secondary | ICD-10-CM | POA: Diagnosis not present

## 2021-01-03 DIAGNOSIS — Z170 Estrogen receptor positive status [ER+]: Secondary | ICD-10-CM | POA: Diagnosis not present

## 2021-01-03 DIAGNOSIS — J439 Emphysema, unspecified: Secondary | ICD-10-CM | POA: Diagnosis not present

## 2021-01-03 DIAGNOSIS — C50919 Malignant neoplasm of unspecified site of unspecified female breast: Secondary | ICD-10-CM | POA: Diagnosis not present

## 2021-01-03 MED ORDER — SODIUM CHLORIDE (PF) 0.9 % IJ SOLN
INTRAMUSCULAR | Status: AC
  Filled 2021-01-03: qty 50

## 2021-01-03 MED ORDER — TECHNETIUM TC 99M MEDRONATE IV KIT
22.0000 | PACK | Freq: Once | INTRAVENOUS | Status: AC | PRN
  Administered 2021-01-03: 12:00:00 22 via INTRAVENOUS

## 2021-01-03 MED ORDER — IOHEXOL 350 MG/ML SOLN
80.0000 mL | Freq: Once | INTRAVENOUS | Status: AC | PRN
  Administered 2021-01-03: 14:00:00 60 mL via INTRAVENOUS

## 2021-01-04 ENCOUNTER — Inpatient Hospital Stay: Payer: Medicare Other

## 2021-01-04 ENCOUNTER — Inpatient Hospital Stay (HOSPITAL_BASED_OUTPATIENT_CLINIC_OR_DEPARTMENT_OTHER): Payer: Medicare Other | Admitting: Oncology

## 2021-01-04 ENCOUNTER — Inpatient Hospital Stay: Payer: Medicare Other | Attending: Oncology

## 2021-01-04 VITALS — BP 133/53 | HR 84 | Temp 98.1°F | Resp 16 | Ht 60.75 in | Wt 109.2 lb

## 2021-01-04 DIAGNOSIS — C7951 Secondary malignant neoplasm of bone: Secondary | ICD-10-CM

## 2021-01-04 DIAGNOSIS — Z170 Estrogen receptor positive status [ER+]: Secondary | ICD-10-CM

## 2021-01-04 DIAGNOSIS — Z79811 Long term (current) use of aromatase inhibitors: Secondary | ICD-10-CM | POA: Insufficient documentation

## 2021-01-04 DIAGNOSIS — C50412 Malignant neoplasm of upper-outer quadrant of left female breast: Secondary | ICD-10-CM

## 2021-01-04 LAB — CBC WITH DIFFERENTIAL (CANCER CENTER ONLY)
Abs Immature Granulocytes: 0.02 10*3/uL (ref 0.00–0.07)
Basophils Absolute: 0 10*3/uL (ref 0.0–0.1)
Basophils Relative: 1 %
Eosinophils Absolute: 0 10*3/uL (ref 0.0–0.5)
Eosinophils Relative: 1 %
HCT: 25.9 % — ABNORMAL LOW (ref 36.0–46.0)
Hemoglobin: 8.6 g/dL — ABNORMAL LOW (ref 12.0–15.0)
Immature Granulocytes: 0 %
Lymphocytes Relative: 44 %
Lymphs Abs: 2.2 10*3/uL (ref 0.7–4.0)
MCH: 37.2 pg — ABNORMAL HIGH (ref 26.0–34.0)
MCHC: 33.2 g/dL (ref 30.0–36.0)
MCV: 112.1 fL — ABNORMAL HIGH (ref 80.0–100.0)
Monocytes Absolute: 0.6 10*3/uL (ref 0.1–1.0)
Monocytes Relative: 12 %
Neutro Abs: 2 10*3/uL (ref 1.7–7.7)
Neutrophils Relative %: 42 %
Platelet Count: 277 10*3/uL (ref 150–400)
RBC: 2.31 MIL/uL — ABNORMAL LOW (ref 3.87–5.11)
RDW: 17.9 % — ABNORMAL HIGH (ref 11.5–15.5)
Smear Review: NORMAL
WBC Count: 4.9 10*3/uL (ref 4.0–10.5)
nRBC: 2 % — ABNORMAL HIGH (ref 0.0–0.2)

## 2021-01-04 LAB — CMP (CANCER CENTER ONLY)
ALT: 33 U/L (ref 0–44)
AST: 35 U/L (ref 15–41)
Albumin: 3.8 g/dL (ref 3.5–5.0)
Alkaline Phosphatase: 71 U/L (ref 38–126)
Anion gap: 13 (ref 5–15)
BUN: 11 mg/dL (ref 8–23)
CO2: 27 mmol/L (ref 22–32)
Calcium: 10 mg/dL (ref 8.9–10.3)
Chloride: 102 mmol/L (ref 98–111)
Creatinine: 1.16 mg/dL — ABNORMAL HIGH (ref 0.44–1.00)
GFR, Estimated: 49 mL/min — ABNORMAL LOW (ref >60)
Glucose, Bld: 108 mg/dL — ABNORMAL HIGH (ref 70–99)
Potassium: 3.2 mmol/L — ABNORMAL LOW (ref 3.5–5.1)
Sodium: 142 mmol/L (ref 135–145)
Total Bilirubin: 0.5 mg/dL (ref 0.3–1.2)
Total Protein: 7.9 g/dL (ref 6.5–8.1)

## 2021-01-04 MED ORDER — MIRTAZAPINE 15 MG PO TABS: 7.5000 mg | ORAL_TABLET | Freq: Every day | ORAL | 4 refills | Status: DC

## 2021-01-04 MED ORDER — SODIUM CHLORIDE 0.9 % IV SOLN: Freq: Once | INTRAVENOUS | Status: AC

## 2021-01-04 MED ORDER — ZOLEDRONIC ACID 4 MG/100ML IV SOLN
4.0000 mg | Freq: Once | INTRAVENOUS | Status: AC
  Administered 2021-01-04: 4 mg via INTRAVENOUS
  Filled 2021-01-04: qty 100

## 2021-01-04 NOTE — Progress Notes (Addendum)
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   She underwent restaging CT chest and bone scan yesterday, 01/03/2021. The scans have not been read yet.  She continues on anastrozole.  She does have some hot flashes and vaginal dryness related to this but no arthralgias or myalgias that we can ascertain.  She was also started on zolendronate on 11/09/2019.  Her most recent dose was 10/10/2020 and this is being repeated every 12 weeks.  She is due for dose today  She started palbociclib on 12/02/2019.  She says this takes her appetite away.  It also ruins her sense of taste.  She has lost a few pounds.  She does not have nausea from it   REVIEW OF SYSTEMS: Leslie [name] tells me quite aside from the appetite problem she has blurred vision, no headaches, no problems with balance, and no fall.  She has pain in her legs when she goes to bed and it can be severe.  This is likely related to her rosuvastatin.  She is constipated which is not a new problem for her.  1 bowel movement a week is not unusual for her.  She says she has to take something to have a bowel movement.  She says she drinks about 3 waters a bottle a day.  She does not eat a lot of fiber foods because everything tastes awful she says.  A detailed review of systems was otherwise stable   COVID 19 VACCINATION STATUS:  Status post Moderna x2 with booster x2   HISTORY OF CURRENT ILLNESS: From [name] he patient evaluation note dated 12/07/2008:   "This is a pleasant 75 year old woman from [facility] referred by Dr. [name] for evaluation and treatment of breast cancer. This woman has been in relatively good health.  She undergoes annual screening mammography.  Her husband is a patient of Dr. [name] who has lung cancer.  She had a screening mammogram on 11/11/2008, which showed an abnormality.  A follow up diagnostic mammogram and right breast ultrasound was performed.  This showed a hyperdense spiculated mass 12 oclock left breast measuring 5 x 3 x 1.5 cm.  There is an associated abnormality seen on ultrasound as well.  Biopsy was recommended with BSGI scan performed 11/23/2008 showed an uptake with a 3 cm of intense isotope activity.  An ultrasound-guided biopsy of both the breast mass in the left breast as well as left axilla was performed on 11/23/2008, both of which show invasive mammary carcinoma.  Prognostic panel showed this to be ER positive at 99%, PR positive at 7%, proliferative index 17%, HER-2 was negative with ratio of 1.2.  An E-cadherin test was performed, which was negative suggestive of lobular phenotype.  The patient has subsequently been referred for a neoadjuvant therapy.  Of note is she had an MRI scan on 12/01/2008, which showed a 4.9 x 3.5 x 2.9 cm area of lobular carcinoma 12 oclock position left breast.  Axillary lymph nodes were also seen measuring 1.6 x 1.1 cm.  Multiple right breast cysts were also seen as well."   The patient's subsequent history is as detailed below.   PAST MEDICAL HISTORY: Past Medical History:  Diagnosis Date   Benign breast cyst in female    Right breast   Cancer of left breast ([place]) 2010   Depression    History of blood transfusion    "related to tubal pregnancy"   Hypertension    PAD (peripheral artery disease) ([place])     PAST SURGICAL  HISTORY: Past Surgical History:  Procedure Laterality Date   ABDOMINAL HYSTERECTOMY  1980s   BREAST CYST EXCISION Right ~ 2005   BREAST LUMPECTOMY Left 11/23/2008   ECTOPIC PREGNANCY SURGERY  1966   LOWER EXTREMITY ANGIOGRAM  06/19/2017   LOWER EXTREMITY ANGIOGRAPHY N/A 06/19/2017   Procedure: LOWER EXTREMITY ANGIOGRAPHY;  Surgeon: Lorretta Harp, MD;  Location: [facility];  Service: Cardiovascular;  Laterality: N/A;   PERIPHERAL VASCULAR BALLOON ANGIOPLASTY Left 06/19/2017   SFA    PERIPHERAL VASCULAR BALLOON ANGIOPLASTY  06/19/2017   Procedure: PERIPHERAL VASCULAR BALLOON ANGIOPLASTY;  Surgeon: Lorretta Harp, MD;  Location: [facility];  Service: Cardiovascular;;  SFA left  hysterectomy in her 69s secondary to tubal pregnancy, ovaries were intact   FAMILY HISTORY: Family History  Problem Relation Age of Onset   Hypertension Mother    Seizures Mother    Heart disease Father    Hypertension Sister    No history of breast or ovarian cancer in the family.  Father had a history of bad heart; mother died at age 54.    The patient has five sisters, two brothers, all of whom in good health.   GYNECOLOGIC HISTORY:  No LMP recorded. Patient has had a hysterectomy. Menarche: 75 years old Age at first live birth:    years old [place] 3 HRT yes  Hysterectomy? yes BSO? no   SOCIAL HISTORY: (updated 10/2019)  Leslie Steptoe "Caidance Sybert" retired from working as a Engineer, mining for [employer]. She is widowed. Her husband of 57 years had a history of lung cancer. She lives by herself with no pets.  She has three grown sons that live in [place], [place], and [place].  The patient has 1 granddaughter, age 37, who is attending college studying to be an Chief Financial Officer.  The patient attends a [employer]    ADVANCED DIRECTIVES: not in place; the patient plans to name her son Cathalina Barcia, who lives in [place] (and works in Pharmacist, hospital) as her healthcare  power of attorney.  He can be reached at [phone]   HEALTH MAINTENANCE: Social History   Tobacco Use   Smoking status: Every Day    Packs/day: 0.50    Years: 45.00    Pack years: 22.50    Types: Cigarettes   Smokeless tobacco: Never  Vaping Use   Vaping Use: Never used  Substance Use Topics   Alcohol use: Yes    Comment: 06/19/2017 "glass of wine 3-4 times/year"   Drug use: Never     Colonoscopy: none on file  PAP: 2012?  Bone density:    Allergies  Allergen Reactions   Aspirin Nausea Only   Zyrtec [Cetirizine] Other (See Comments)    Chest Pain    Current Outpatient Medications  Medication Sig Dispense Refill   acetaminophen (TYLENOL) 500 MG tablet Take 500-1,000 mg by mouth every 6 (six) hours as needed  for moderate pain or headache.     anastrozole (ARIMIDEX) 1 MG tablet Take 1 tablet (1 mg total) by mouth daily. 90 tablet 4   atenolol-chlorthalidone (TENORETIC) 50-25 MG per tablet Take 0.5 tablets by mouth daily.      cholecalciferol (VITAMIN D) 400 units TABS tablet Take 400 Units by mouth daily.     Evening Primrose Oil 1000 MG CAPS Take 1,000 mg by mouth daily.     fluocinonide cream (LIDEX) 4.23 % Apply 1 application topically 2 (two) times daily as needed (for break outs).     folic acid (FOLVITE) 536 MCG tablet Take 400 mcg by mouth daily.      gabapentin (NEURONTIN) 300 MG capsule Take 1 capsule (300 mg total) by mouth at bedtime. (Patient not taking: Reported on 10/25/2020) 90 capsule 4   lidocaine (LIDODERM) 5 % Place 1 patch onto the skin daily. Remove & Discard patch within 12 hours or as directed by MD 30 patch 0   mirtazapine (REMERON) 15 MG tablet Take 0.5 tablets (7.5 mg total) by mouth at bedtime. 90 tablet 4   Multiple Vitamin (MULTIVITAMIN PO) Take 1 tablet by mouth daily.      ondansetron (ZOFRAN) 8 MG tablet Take by mouth every 8 (eight) hours as needed for nausea or vomiting. 20 tablet 0   palbociclib (IBRANCE) 125 MG tablet Take 1 tablet (125 mg  total) by mouth daily. Take for 21 days on, 7 days off, repeat every 28 days. 21 tablet 6   rosuvastatin (CRESTOR) 10 MG tablet Take 1 tablet (10 mg total) by mouth daily. 90 tablet 1   triamcinolone cream (KENALOG) 0.1 % Apply topically.     venlafaxine XR (EFFEXOR-XR) 37.5 MG 24 hr capsule Take 1 capsule (37.5 mg total) by mouth daily with breakfast. (Patient not taking: Reported on 10/25/2020) 90 capsule 4   vitamin E 400 UNIT capsule Take 400 Units by mouth daily.     No current facility-administered medications for this visit.    OBJECTIVE: African-American woman in no acute distress  Vitals:   01/04/21 1421  BP: (!) 133/53  Pulse: 84  Resp: 16  Temp: 98.1 F (36.7 C)  SpO2: 98%       Body mass index is 20.8 kg/m.   Wt Readings from Last 3 Encounters:  01/04/21 109 lb 3 oz (49.5 kg)  11/09/20 110 lb 11.2 oz (50.2 kg)  10/25/20 113 lb 12.8 oz (51.6 kg)     ECOG FS:1 - Symptomatic but completely ambulatory  Sclerae unicteric, EOMs intact Wearing a mask No cervical or supraclavicular adenopathy Lungs no rales or rhonchi Heart regular rate and rhythm Abd soft, nontender, positive bowel sounds MSK no focal spinal tenderness, no upper extremity lymphedema Neuro: nonfocal, well oriented, appropriate affect Breasts: The right breast is unremarkable.  The left breast has undergone lumpectomy and radiation with no evidence of disease recurrence locally.  Both axillae are benign.   LAB RESULTS:  CMP     Component Value Date/Time   NA 142 01/04/2021 1412   NA 145 (H) 06/04/2017 1513   NA 141 02/09/2013 1056   K 3.2 (L) 01/04/2021 1412   K 3.4 (L) 02/09/2013 1056   CL 102 01/04/2021 1412   CL 102 06/03/2012 1106   CO2 27 01/04/2021 1412   CO2 27 02/09/2013 1056   GLUCOSE 108 (H) 01/04/2021 1412   GLUCOSE 134 02/09/2013 1056   GLUCOSE 101 (H) 06/03/2012 1106   BUN 11 01/04/2021 1412  BUN 11 06/04/2017 1513   BUN 11.0 02/09/2013 1056   CREATININE 1.16 (H) 01/04/2021  1412   CREATININE 0.9 02/09/2013 1056   CALCIUM 10.0 01/04/2021 1412   CALCIUM 9.8 02/09/2013 1056   PROT 7.9 01/04/2021 1412   PROT 7.2 10/06/2018 1005   PROT 7.4 02/09/2013 1056   ALBUMIN 3.8 01/04/2021 1412   ALBUMIN 4.4 10/06/2018 1005   ALBUMIN 4.0 02/09/2013 1056   AST 35 01/04/2021 1412   AST 19 02/09/2013 1056   ALT 33 01/04/2021 1412   ALT 20 02/09/2013 1056   ALKPHOS 71 01/04/2021 1412   ALKPHOS 68 02/09/2013 1056   BILITOT 0.5 01/04/2021 1412   BILITOT 0.49 02/09/2013 1056   GFRNONAA 49 (L) 01/04/2021 1412   GFRAA >60 06/20/2017 0249    Lab Results  Component Value Date   TOTALPROTELP 7.5 11/03/2019   ALBUMINELP 4.0 11/03/2019   A1GS 0.2 11/03/2019   A2GS 0.8 11/03/2019   BETS 1.1 11/03/2019   GAMS 1.4 11/03/2019   MSPIKE Not Observed 11/03/2019   SPEI Comment 11/03/2019    Lab Results  Component Value Date   WBC 4.9 01/04/2021   NEUTROABS 2.0 01/04/2021   HGB 8.6 (L) 01/04/2021   HCT 25.9 (L) 01/04/2021   MCV 112.1 (H) 01/04/2021   PLT 277 01/04/2021    Lab Results  Component Value Date   LABCA2 33 12/05/2011    No components found for: QVZDGL875  No results for input(s): INR in the last 168 hours.  Lab Results  Component Value Date   LABCA2 33 12/05/2011    No results found for: CAN199  No results found for: CAN125  No results found for: IEP329  Lab Results  Component Value Date   CA2729 41.3 (H) 11/03/2019    No components found for: HGQUANT  Lab Results  Component Value Date   CEA1 2.33 11/03/2019   /  CEA ([facility])  Date Value Ref Range Status  11/03/2019 2.33 0.00 - 5.00 ng/mL Final    Comment:    (NOTE) This test was performed using Architect's Chemiluminescent Microparticle Immunoassay. Values obtained from different assay methods cannot be used interchangeably. Please note that 5-10% of patients who smoke may see CEA levels up to 6.9 ng/mL. Performed at [facility], [place]  [address] [place], [place] [address]      No results found for: AFPTUMOR  No results found for: [facility]  Lab Results  Component Value Date   KPAFRELGTCHN 25.8 (H) 11/03/2019   LAMBDASER 17.7 11/03/2019   KAPLAMBRATIO 1.46 11/03/2019   (kappa/lambda light chains)  No results found for: HGBA, HGBA2QUANT, HGBFQUANT, HGBSQUAN (Hemoglobinopathy evaluation)   Lab Results  Component Value Date   LDH 182 06/03/2012    No results found for: IRON, TIBC, IRONPCTSAT (Iron and TIBC)  No results found for: FERRITIN  Urinalysis    Component Value Date/Time   COLORURINE YELLOW 08/17/2010 [place] 08/17/2010 1105   LABSPEC 1.017 08/17/2010 1105   PHURINE 8.0 08/17/2010 1105   GLUCOSEU NEGATIVE 08/17/2010 1105   HGBUR NEGATIVE 08/17/2010 1105   BILIRUBINUR NEGATIVE 08/17/2010 1105   KETONESUR NEGATIVE 08/17/2010 1105   PROTEINUR NEGATIVE 08/17/2010 1105   UROBILINOGEN 0.2 08/17/2010 1105   NITRITE NEGATIVE 08/17/2010 1105   LEUKOCYTESUR NEGATIVE 08/17/2010 1105    STUDIES: No results found.   ELIGIBLE FOR AVAILABLE RESEARCH PROTOCOL: no  ASSESSMENT: 75 y.o. [place] woman  1.  Status post left breast upper outer  quadrant biopsy and left axillary needle core biopsy on 11/23/2008 for clinical T3 N1, stage IIA invasive lobular carcinoma, E-cadherin negative, estrogen and progesterone receptor positive, HER-2 not amplified, with an MIB-1 of 17% Ki-67 17%   2.    On neoadjuvant letrozole started in 12/2008, interrupted at the time of her definitive surgery, then resumed post-op   4.  Status post left breast lumpectomy with regional lymph node resection 07/12/2009 for a stage IIA, pT1c pN1, invasive lobular carcinoma,with angiolymphatic invasion identified  (a) 2 of 8 removed left nodes were involved   (b) repeat prognostic panel again estrogen receptor positive, not progesterone receptor negative, with an MIB-1 of 74% and HER-2 not amplified   (c)  additional surgery for margin clearance 08/04/2009 showed only atypical lobular hyperplasia   5. Oncotype score of 20 predicts a 5-year rate of recurrence outside the breast with antiestrogens as the only systemic therapy of 13%.  It also predicts no significant benefit from chemotherapy.    6.  status post radiation therapy 09/26/2009-11/11/18  7.  letrozole continued through December 2015 (5 years)  METASTATIC DISEASE: September 2021 8.  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  zoledronate started 11/10/2019, repeat every 12 weeks  10.  anastrozole started 11/05/2019  (a) palbociclib 125 mg daily, 21/7, started 12/02/2019  11. Restaging studies:  (A) chest CT with contrast 06/04/2020 shows no visceral disease, unchanged bony lesions  (B) bone scan 06/04/2020 shows no change compared to 03/09/2020  (C) bone scan 01/03/2021  (D) chest CT 01/03/2021   PLAN: Leslie [name] is a little over a year out from definitive diagnosis of metastatic breast cancer.  Her disease appears to be well controlled clinically.  We just obtained a restaging studies but they have not yet been read.  I let her know that I will not be able to call her back until Monday, 01/08/2021 but I will give her those results at that time.  I think the leg pain she is experiencing at night is going to be due to the rosuvastatin.  I asked her to stop that.  If after a month the leg pain has resolved as I expect she may go back on a lower dose or an every other or every third day dose.  I do not have a better explanation of the  altered taste and loss of appetite then that Ibrance/palbociclib is the cause.  Accordingly we are going off that medication for 1 month.  If when she returns a month from now she is no better we can always continue the palbociclib but if she is feeling a lot better as far as those symptoms are concerned possibly we can go to a different CDK inhibitor or change her treatment altogether.  Of course if we do document disease progression on the scans yesterday we would need to do that in any case  Finally I am upping the Remeron dose to 15 mg at bedtime  She will return to see [place] in 4 weeks.  She knows to call for any other issue that may develop before then.  Total encounter time 25 minutes.*  Virgie Dad. Leslie Bogie, MD 01/04/2021 3:11 PM Medical Oncology and Hematology [facility] [place], [place] [address] Tel. [phone]    Fax. [phone]   This document serves as a record of services personally performed by Lurline Del, MD. It was created on his behalf by Wilburn Mylar, a trained medical scribe. The creation of this record is based on the scribe's personal observations and the provider's statements to them.   I, Lurline Del MD, have reviewed the above documentation for accuracy and completeness, and I agree with the above.   *Total Encounter Time as defined by the Centers for Medicare and Medicaid Services includes, in addition to the face-to-face time of a patient visit (documented in the note above) non-face-to-face time: obtaining and reviewing outside history, ordering and reviewing medications, tests or procedures, care coordination (communications with other health care professionals or caregivers) and documentation in the medical record.

## 2021-01-04 NOTE — Patient Instructions (Signed)

## 2021-01-09 ENCOUNTER — Other Ambulatory Visit: Payer: Self-pay | Admitting: Oncology

## 2021-01-09 NOTE — Progress Notes (Signed)
I called Leslie [name] and gave her the results of her scans which do show evidence of disease progression.  She already has a follow-up appointment with [place] 02/07/2020 and we can discuss next treatment at that time.  She is otherwise "fine "and looking forward to the holidays

## 2021-01-18 DIAGNOSIS — H532 Diplopia: Secondary | ICD-10-CM | POA: Diagnosis not present

## 2021-01-23 ENCOUNTER — Other Ambulatory Visit: Payer: Self-pay | Admitting: Ophthalmology

## 2021-01-23 DIAGNOSIS — H532 Diplopia: Secondary | ICD-10-CM

## 2021-01-25 NOTE — Addendum Note (Signed)
Addended by: Beatrix Fetters on: 01/25/2021 03:51 PM   Modules accepted: Orders

## 2021-01-26 NOTE — Telephone Encounter (Signed)
Patient is approved for Ibrance at no cost from [employer] 01/21/21-01/20/22  [place] uses [place] Patient [place] Phone [phone] Fax [phone] 01/26/2021 5:54 PM

## 2021-02-01 ENCOUNTER — Other Ambulatory Visit (HOSPITAL_COMMUNITY): Payer: Self-pay | Admitting: Cardiovascular Disease

## 2021-02-01 ENCOUNTER — Ambulatory Visit (HOSPITAL_COMMUNITY)
Admission: RE | Admit: 2021-02-01 | Discharge: 2021-02-01 | Disposition: A | Discharge status: Home / Self Care | Payer: Medicare Other | Source: Ambulatory Visit | Attending: Cardiology | Admitting: Cardiology

## 2021-02-01 ENCOUNTER — Other Ambulatory Visit: Payer: Self-pay

## 2021-02-01 DIAGNOSIS — Z9862 Peripheral vascular angioplasty status: Secondary | ICD-10-CM

## 2021-02-01 DIAGNOSIS — I739 Peripheral vascular disease, unspecified: Secondary | ICD-10-CM

## 2021-02-05 ENCOUNTER — Emergency Department (HOSPITAL_COMMUNITY)
Admission: EM | Admit: 2021-02-05 | Discharge: 2021-02-05 | Disposition: A | Discharge status: Home / Self Care | Payer: Medicare Other | Source: Home / Self Care | Attending: Emergency Medicine | Admitting: Emergency Medicine

## 2021-02-05 ENCOUNTER — Other Ambulatory Visit: Payer: Self-pay

## 2021-02-05 ENCOUNTER — Emergency Department (HOSPITAL_COMMUNITY): Payer: Medicare Other

## 2021-02-05 ENCOUNTER — Encounter (HOSPITAL_COMMUNITY): Payer: Self-pay

## 2021-02-05 DIAGNOSIS — D72829 Elevated white blood cell count, unspecified: Secondary | ICD-10-CM | POA: Diagnosis not present

## 2021-02-05 DIAGNOSIS — G319 Degenerative disease of nervous system, unspecified: Secondary | ICD-10-CM | POA: Diagnosis not present

## 2021-02-05 DIAGNOSIS — Z853 Personal history of malignant neoplasm of breast: Secondary | ICD-10-CM | POA: Insufficient documentation

## 2021-02-05 DIAGNOSIS — M25552 Pain in left hip: Secondary | ICD-10-CM | POA: Diagnosis not present

## 2021-02-05 DIAGNOSIS — G96198 Other disorders of meninges, not elsewhere classified: Secondary | ICD-10-CM | POA: Diagnosis not present

## 2021-02-05 DIAGNOSIS — K5900 Constipation, unspecified: Secondary | ICD-10-CM | POA: Diagnosis not present

## 2021-02-05 DIAGNOSIS — M545 Low back pain, unspecified: Secondary | ICD-10-CM

## 2021-02-05 DIAGNOSIS — M47812 Spondylosis without myelopathy or radiculopathy, cervical region: Secondary | ICD-10-CM | POA: Diagnosis not present

## 2021-02-05 DIAGNOSIS — D638 Anemia in other chronic diseases classified elsewhere: Secondary | ICD-10-CM | POA: Diagnosis not present

## 2021-02-05 DIAGNOSIS — C7949 Secondary malignant neoplasm of other parts of nervous system: Secondary | ICD-10-CM | POA: Insufficient documentation

## 2021-02-05 DIAGNOSIS — G834 Cauda equina syndrome: Secondary | ICD-10-CM | POA: Diagnosis not present

## 2021-02-05 DIAGNOSIS — C7951 Secondary malignant neoplasm of bone: Secondary | ICD-10-CM

## 2021-02-05 DIAGNOSIS — Z66 Do not resuscitate: Secondary | ICD-10-CM | POA: Diagnosis not present

## 2021-02-05 DIAGNOSIS — E876 Hypokalemia: Secondary | ICD-10-CM | POA: Diagnosis present

## 2021-02-05 DIAGNOSIS — G9389 Other specified disorders of brain: Secondary | ICD-10-CM | POA: Diagnosis not present

## 2021-02-05 DIAGNOSIS — Z886 Allergy status to analgesic agent status: Secondary | ICD-10-CM | POA: Diagnosis not present

## 2021-02-05 DIAGNOSIS — Z681 Body mass index (BMI) 19 or less, adult: Secondary | ICD-10-CM | POA: Diagnosis not present

## 2021-02-05 DIAGNOSIS — C50919 Malignant neoplasm of unspecified site of unspecified female breast: Secondary | ICD-10-CM | POA: Diagnosis not present

## 2021-02-05 DIAGNOSIS — R590 Localized enlarged lymph nodes: Secondary | ICD-10-CM | POA: Diagnosis not present

## 2021-02-05 DIAGNOSIS — R0602 Shortness of breath: Secondary | ICD-10-CM | POA: Diagnosis not present

## 2021-02-05 DIAGNOSIS — N281 Cyst of kidney, acquired: Secondary | ICD-10-CM | POA: Diagnosis not present

## 2021-02-05 DIAGNOSIS — M25551 Pain in right hip: Secondary | ICD-10-CM | POA: Diagnosis not present

## 2021-02-05 DIAGNOSIS — I10 Essential (primary) hypertension: Secondary | ICD-10-CM | POA: Diagnosis not present

## 2021-02-05 DIAGNOSIS — G959 Disease of spinal cord, unspecified: Secondary | ICD-10-CM | POA: Diagnosis not present

## 2021-02-05 DIAGNOSIS — C7931 Secondary malignant neoplasm of brain: Secondary | ICD-10-CM | POA: Diagnosis not present

## 2021-02-05 DIAGNOSIS — Z79899 Other long term (current) drug therapy: Secondary | ICD-10-CM | POA: Diagnosis not present

## 2021-02-05 DIAGNOSIS — F17210 Nicotine dependence, cigarettes, uncomplicated: Secondary | ICD-10-CM | POA: Diagnosis present

## 2021-02-05 DIAGNOSIS — F32A Depression, unspecified: Secondary | ICD-10-CM | POA: Diagnosis present

## 2021-02-05 DIAGNOSIS — R7401 Elevation of levels of liver transaminase levels: Secondary | ICD-10-CM | POA: Diagnosis present

## 2021-02-05 DIAGNOSIS — E43 Unspecified severe protein-calorie malnutrition: Secondary | ICD-10-CM | POA: Diagnosis not present

## 2021-02-05 DIAGNOSIS — Z20822 Contact with and (suspected) exposure to covid-19: Secondary | ICD-10-CM | POA: Diagnosis not present

## 2021-02-05 DIAGNOSIS — I739 Peripheral vascular disease, unspecified: Secondary | ICD-10-CM | POA: Diagnosis not present

## 2021-02-05 DIAGNOSIS — R338 Other retention of urine: Secondary | ICD-10-CM | POA: Diagnosis not present

## 2021-02-05 DIAGNOSIS — J9811 Atelectasis: Secondary | ICD-10-CM | POA: Diagnosis not present

## 2021-02-05 DIAGNOSIS — E782 Mixed hyperlipidemia: Secondary | ICD-10-CM | POA: Diagnosis not present

## 2021-02-05 DIAGNOSIS — C799 Secondary malignant neoplasm of unspecified site: Secondary | ICD-10-CM | POA: Diagnosis not present

## 2021-02-05 DIAGNOSIS — R5381 Other malaise: Secondary | ICD-10-CM | POA: Diagnosis present

## 2021-02-05 DIAGNOSIS — Z79811 Long term (current) use of aromatase inhibitors: Secondary | ICD-10-CM | POA: Diagnosis not present

## 2021-02-05 DIAGNOSIS — I517 Cardiomegaly: Secondary | ICD-10-CM | POA: Diagnosis not present

## 2021-02-05 DIAGNOSIS — Z170 Estrogen receptor positive status [ER+]: Secondary | ICD-10-CM | POA: Diagnosis not present

## 2021-02-05 DIAGNOSIS — Z515 Encounter for palliative care: Secondary | ICD-10-CM | POA: Diagnosis not present

## 2021-02-05 DIAGNOSIS — D539 Nutritional anemia, unspecified: Secondary | ICD-10-CM | POA: Diagnosis not present

## 2021-02-05 DIAGNOSIS — C50412 Malignant neoplasm of upper-outer quadrant of left female breast: Secondary | ICD-10-CM | POA: Diagnosis not present

## 2021-02-05 DIAGNOSIS — J439 Emphysema, unspecified: Secondary | ICD-10-CM | POA: Diagnosis not present

## 2021-02-05 DIAGNOSIS — Z9862 Peripheral vascular angioplasty status: Secondary | ICD-10-CM | POA: Diagnosis not present

## 2021-02-05 DIAGNOSIS — I700 Atherosclerosis of aorta: Secondary | ICD-10-CM | POA: Diagnosis not present

## 2021-02-05 DIAGNOSIS — R918 Other nonspecific abnormal finding of lung field: Secondary | ICD-10-CM | POA: Diagnosis not present

## 2021-02-05 DIAGNOSIS — C719 Malignant neoplasm of brain, unspecified: Secondary | ICD-10-CM | POA: Diagnosis not present

## 2021-02-05 DIAGNOSIS — E785 Hyperlipidemia, unspecified: Secondary | ICD-10-CM | POA: Diagnosis not present

## 2021-02-05 DIAGNOSIS — J90 Pleural effusion, not elsewhere classified: Secondary | ICD-10-CM | POA: Diagnosis not present

## 2021-02-05 LAB — CBC WITH DIFFERENTIAL/PLATELET
Abs Immature Granulocytes: 0.1 10*3/uL — ABNORMAL HIGH (ref 0.00–0.07)
Basophils Absolute: 0.1 10*3/uL (ref 0.0–0.1)
Basophils Relative: 1 %
Eosinophils Absolute: 0 10*3/uL (ref 0.0–0.5)
Eosinophils Relative: 0 %
HCT: 26.3 % — ABNORMAL LOW (ref 36.0–46.0)
Hemoglobin: 8.3 g/dL — ABNORMAL LOW (ref 12.0–15.0)
Lymphocytes Relative: 35 %
Lymphs Abs: 3.6 10*3/uL (ref 0.7–4.0)
MCH: 35.5 pg — ABNORMAL HIGH (ref 26.0–34.0)
MCHC: 31.6 g/dL (ref 30.0–36.0)
MCV: 112.4 fL — ABNORMAL HIGH (ref 80.0–100.0)
Metamyelocytes Relative: 1 %
Monocytes Absolute: 0.6 10*3/uL (ref 0.1–1.0)
Monocytes Relative: 6 %
Neutro Abs: 5.9 10*3/uL (ref 1.7–7.7)
Neutrophils Relative %: 57 %
Platelets: 458 10*3/uL — ABNORMAL HIGH (ref 150–400)
RBC: 2.34 MIL/uL — ABNORMAL LOW (ref 3.87–5.11)
RDW: 19.2 % — ABNORMAL HIGH (ref 11.5–15.5)
WBC: 10.3 10*3/uL (ref 4.0–10.5)
nRBC: 7.9 % — ABNORMAL HIGH (ref 0.0–0.2)

## 2021-02-05 LAB — BASIC METABOLIC PANEL
Anion gap: 10 (ref 5–15)
BUN: 11 mg/dL (ref 8–23)
CO2: 25 mmol/L (ref 22–32)
Calcium: 9.1 mg/dL (ref 8.9–10.3)
Chloride: 100 mmol/L (ref 98–111)
Creatinine, Ser: 0.83 mg/dL (ref 0.44–1.00)
GFR, Estimated: >60 mL/min (ref >60)
Glucose, Bld: 100 mg/dL — ABNORMAL HIGH (ref 70–99)
Potassium: 4 mmol/L (ref 3.5–5.1)
Sodium: 135 mmol/L (ref 135–145)

## 2021-02-05 MED ORDER — OXYCODONE-ACETAMINOPHEN 5-325 MG PO TABS
1.0000 | ORAL_TABLET | Freq: Four times a day (QID) | ORAL | 0 refills | Status: DC | PRN
Start: 2021-02-05 — End: 2021-02-19

## 2021-02-05 MED ORDER — OXYCODONE-ACETAMINOPHEN 5-325 MG PO TABS
1.0000 | ORAL_TABLET | Freq: Once | ORAL | Status: AC
  Administered 2021-02-05: 1 via ORAL
  Filled 2021-02-05: qty 1

## 2021-02-05 NOTE — ED Notes (Signed)
Pt in bed, pt reports decreased pain.  Pt states that she is ready to go home. States that she is calling family to come get her.

## 2021-02-05 NOTE — ED Provider Triage Note (Signed)
Emergency Medicine Provider Triage Evaluation Note  Leslie [name] , a 76 y.o. female  was evaluated in triage.  Pt complains of back pain of 3-week duration which has been intermittent up until this morning when it became severe.  Patient reports over the past 3 weeks she is rating her pain on a scale of 3-4/10 with radiculopathy however today is 10/10 and she is afraid to stand.  She reports over the past 2 weeks she has also had difficulty controlling her bowels and bladders and if she does not make it to the bathroom on time she has an accident on herself.  She has history of breast cancer and reports has metastasized to bone.  She denies other complaints.  Review of Systems  Positive: As above Negative: As above  Physical Exam  BP 134/72    Pulse 98    Temp 98.3 F (36.8 C) (Oral)    Resp 18    SpO2 95%  Gen:   Awake, no distress   Resp:  Normal effort  MSK:   Moves extremities without difficulty  Other:  Significant pain over bilateral.  Cervical, thoracic, lumbar spine without tenderness to palpation.  4/5 strength in bilateral hips, knees.  Medical Decision Making  Medically screening exam initiated at 2:06 PM.  Appropriate orders placed.  Leslie [name] was informed that the remainder of the evaluation will be completed by another provider, this initial triage assessment does not replace that evaluation, and the importance of remaining in the ED until their evaluation is complete.     Evlyn Courier, PA-C 02/05/21 1408

## 2021-02-05 NOTE — ED Triage Notes (Signed)
Pt reports left and right lower back pain and radiates down leg that started this morning and before about 1 week ago.  Pt reports the pain is so bad it makes her feel dizzy and worried she will fall.   Hx: Breast cancer with mets to bone   Ambulatory with cane A/oX4   9/10 pain

## 2021-02-05 NOTE — ED Provider Notes (Signed)
Leslie [name]   CSN: [id number] Arrival date & time: 02/05/21  1336     History  Chief Complaint  Patient presents with   Back Pain    Leslie [name] is a 76 y.o. female.  She has a history of breast cancer with mets to bone.  She has had on and off back pain for the last few weeks.  It was much worse today when she woke up.  Feels like she might fall.  She is also had some urinary frequency and possible incontinence has been going on for a few weeks.  She said some numbness on the bottom of her feet for a few weeks.  She denies any chest pain shortness of breath abdominal pain vomiting or diarrhea.  No bowel incontinence.  No known trauma.  She is not currently on any pain medication.  No fevers or chills.  The history is provided by the patient.  Back Pain Location:  Lumbar spine Quality:  Stabbing Radiates to:  L thigh and R thigh Associated symptoms: numbness   Associated symptoms: no abdominal pain, no chest pain, no dysuria and no fever       Home Medications Prior to Admission medications   Medication Sig Start Date End Date Taking? Authorizing Provider  acetaminophen (TYLENOL) 500 MG tablet Take 500-1,000 mg by mouth every 6 (six) hours as needed for moderate pain or headache.    [provider]  anastrozole (ARIMIDEX) 1 MG tablet Take 1 tablet (1 mg total) by mouth daily. 12/12/20   Magrinat, Virgie Dad, MD  atenolol-chlorthalidone (TENORETIC) 50-25 MG per tablet Take 0.5 tablets by mouth daily.     [provider]  cholecalciferol (VITAMIN D) 400 units TABS tablet Take 400 Units by mouth daily.    [provider]  Evening Primrose Oil 1000 MG CAPS Take 1,000 mg by mouth daily.    [provider]  fluocinonide cream (LIDEX) 6.23 % Apply 1 application topically 2 (two) times daily as needed (for break outs).    [provider]  folic acid (FOLVITE) 762 MCG tablet Take 400 mcg by  mouth daily.     [provider]  gabapentin (NEURONTIN) 300 MG capsule Take 1 capsule (300 mg total) by mouth at bedtime. Patient not taking: Reported on 10/25/2020 12/30/19   Magrinat, Virgie Dad, MD  lidocaine (LIDODERM) 5 % Place 1 patch onto the skin daily. Remove & Discard patch within 12 hours or as directed by MD 11/09/20   Magrinat, Virgie Dad, MD  mirtazapine (REMERON) 15 MG tablet Take 0.5 tablets (7.5 mg total) by mouth at bedtime. 01/04/21   Magrinat, Virgie Dad, MD  Multiple Vitamin (MULTIVITAMIN PO) Take 1 tablet by mouth daily.     [provider]  ondansetron (ZOFRAN) 8 MG tablet Take by mouth every 8 (eight) hours as needed for nausea or vomiting. 11/09/20   Magrinat, Virgie Dad, MD  palbociclib (IBRANCE) 125 MG tablet Take 1 tablet (125 mg total) by mouth daily. Take for 21 days on, 7 days off, repeat every 28 days. 07/18/20   Magrinat, Virgie Dad, MD  rosuvastatin (CRESTOR) 10 MG tablet Take 1 tablet (10 mg total) by mouth daily. 10/25/20   Lorretta Harp, MD  triamcinolone cream (KENALOG) 0.1 % Apply topically. 05/07/19   [provider]  venlafaxine XR (EFFEXOR-XR) 37.5 MG 24 hr capsule Take 1 capsule (37.5 mg total) by mouth daily with breakfast. Patient not taking: Reported  on 10/25/2020 03/14/20   Magrinat, Virgie Dad, MD  vitamin E 400 UNIT capsule Take 400 Units by mouth daily.    [provider]      Allergies    Aspirin and Zyrtec [cetirizine]    Review of Systems   Review of Systems  Constitutional:  Negative for fever.  HENT:  Negative for sore throat.   Eyes:  Negative for visual disturbance.  Respiratory:  Negative for shortness of breath.   Cardiovascular:  Negative for chest pain.  Gastrointestinal:  Negative for abdominal pain.  Genitourinary:  Negative for dysuria.  Musculoskeletal:  Positive for back pain.  Skin:  Negative for rash.  Neurological:  Positive for numbness.   Physical Exam Updated Vital Signs BP 139/77 (BP  Location: Right Arm)    Pulse 93    Temp 98.5 F (36.9 C) (Oral)    Resp 18    SpO2 97%  Physical Exam Vitals and nursing [name] reviewed.  Constitutional:      General: She is not in acute distress.    Appearance: Normal appearance. She is well-developed.  HENT:     Head: Normocephalic and atraumatic.  Eyes:     Conjunctiva/sclera: Conjunctivae normal.  Cardiovascular:     Rate and Rhythm: Normal rate and regular rhythm.     Heart sounds: No murmur heard. Pulmonary:     Effort: Pulmonary effort is normal. No respiratory distress.     Breath sounds: Normal breath sounds.  Abdominal:     Palpations: Abdomen is soft.     Tenderness: There is no abdominal tenderness.  Musculoskeletal:        General: Tenderness present. No swelling.     Cervical back: Neck supple.     Comments: She is tender in her lumbar spine and paralumbar area.  Skin:    General: Skin is warm and dry.     Capillary Refill: Capillary refill takes less than 2 seconds.  Neurological:     General: No focal deficit present.     Mental Status: She is alert.     Cranial Nerves: No cranial nerve deficit.     Sensory: No sensory deficit.     Motor: No weakness.  Psychiatric:        Mood and Affect: Mood normal.    ED Results / Procedures / Treatments   Labs (all labs ordered are listed, but only abnormal results are displayed) Labs Reviewed  CBC WITH DIFFERENTIAL/PLATELET - Abnormal; Notable for the following components:      Result Value   RBC 2.34 (*)    Hemoglobin 8.3 (*)    HCT 26.3 (*)    MCV 112.4 (*)    MCH 35.5 (*)    RDW 19.2 (*)    Platelets 458 (*)    nRBC 7.9 (*)    Abs Immature Granulocytes 0.10 (*)    All other components within normal limits  BASIC METABOLIC PANEL - Abnormal; Notable for the following components:   Glucose, Bld 100 (*)    All other components within normal limits    EKG None  Radiology DG Lumbar Spine Complete  Result Date: 02/05/2021 CLINICAL DATA:  Hip pain EXAM:  LUMBAR SPINE - COMPLETE 4+ VIEW COMPARISON:  08/10/2019 FINDINGS: Levoscoliosis. Vertebral body heights are grossly maintained. 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 Multilevel degenerative change, advanced at L2-L3. Facet degenerative changes at multiple levels. IMPRESSION: 1. Scoliosis with degenerative changes most advanced at L2-L3. 2. Diffuse sclerosis consistent with skeletal  metastatic disease Electronically Signed   By: Donavan Foil M.D.   On: 02/05/2021 15:33   DG Hip Unilat W or Wo Pelvis 2-3 Views Left  Result Date: 02/05/2021 CLINICAL DATA:  Hip pain EXAM: DG HIP (WITH OR WITHOUT PELVIS) 2-3V LEFT COMPARISON:  Bone scan 01/03/2021 FINDINGS: SI joints are non widened. Pubic symphysis and rami appear intact. 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 No fracture or malalignment. IMPRESSION: 1. No acute osseous abnormality. 2. 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   On: 02/05/2021 15:23   DG Hip Unilat W or Wo Pelvis 2-3 Views Right  Result Date: 02/05/2021 CLINICAL DATA:  Hip pain EXAM: DG HIP (WITH OR WITHOUT PELVIS) 2-3V RIGHT COMPARISON:  Bone scan 01/03/2021 FINDINGS: SI joints are non widened. Pubic symphysis is intact. No fracture or malalignment. Heterogeneous sclerosis within the femurs and pelvic bones consistent with history of skeletal osseous metastatic disease IMPRESSION: 1. No acute osseous abnormality 2. Heterogeneous sclerosis within the pelvic bones and femurs consistent with history of skeletal metastatic disease Electronically Signed   By: Donavan Foil M.D.   On: 02/05/2021 15:22    Procedures Procedures    Medications Ordered in ED Medications  oxyCODONE-acetaminophen (PERCOCET/ROXICET) 5-325 MG per tablet 1 tablet (has no administration in time range)    ED Course/ Medical Decision Making/ A&P Clinical Course as of  02/06/21 1031  Mon Feb 05, 2021  1459 Patient had ABIs done on her legs 4 days ago that were unchanged from a year ago. [MB]  1610 Reviewed results of imaging and work-up with patient.  She does not want to undergo MRI at this time.  She is just hoping for pain control.  She is an appointment with her new oncologist next week.  She had adequate pain control with the oxycodone. [MB]    Clinical Course User Index [MB] Hayden Rasmussen, MD                           Medical Decision Making Risk Prescription drug management.   This patient complains of low back pain and possible sciatica in the setting of metastatic breast cancer; this involves an extensive number of treatment Options and is a complaint that carries with it a high risk of complications and Morbidity. The differential includes musculoskeletal pain, radiculopathy, metastatic disease, metabolic derangement, UTI, cauda equina  I ordered, reviewed and interpreted labs, which included CBC with normal white count, hemoglobin low stable from priors, chemistries unremarkable, urinalysis ordered not obtained I ordered medication oral pain medication with improvement in her symptoms I ordered imaging studies which included x-rays of lumbar spine and bilateral hips and I independently    visualized and interpreted imaging which showed spinal metastatic disease no fracture  Previous records obtained and reviewed including prior oncology notes patient with known metastatic disease  After the interventions stated above, I reevaluated the patient and found patient be neurologically intact and well-appearing.  Reviewed results of work-up with her.  Offered further imaging including MRI to exclude any of further lesions.  Patient declines this at this time.  She is appreciative of pain control and will follow-up with her oncologist.  Return instructions discussed         Final Clinical Impression(s) / ED Diagnoses Final diagnoses:   Acute bilateral low back pain, unspecified whether sciatica present  Bone metastases ([place])  Rx / DC Orders ED Discharge Orders          Ordered    oxyCODONE-acetaminophen (PERCOCET/ROXICET) 5-325 MG tablet  Every 6 hours PRN        02/05/21 1643              Hayden Rasmussen, MD 02/06/21 1035

## 2021-02-05 NOTE — Discharge Instructions (Addendum)
You were seen in the emergency department for worsening back and leg pain.  Your x-ray showed that you had metastatic cancer in your spine pelvis and thighs.  This is likely the cause of your pain.  We are prescribing you some pain medication.  This may be constipating so please be aware.  Please contact your new oncologist this week and let them know how you are doing.  Return to the emergency department if any worsening or concerning symptoms.

## 2021-02-06 ENCOUNTER — Ambulatory Visit: Payer: Medicare Other | Admitting: Hematology and Oncology

## 2021-02-06 ENCOUNTER — Other Ambulatory Visit: Payer: Medicare Other

## 2021-02-07 ENCOUNTER — Other Ambulatory Visit: Payer: Self-pay

## 2021-02-07 ENCOUNTER — Encounter (HOSPITAL_COMMUNITY): Payer: Self-pay

## 2021-02-07 ENCOUNTER — Emergency Department (HOSPITAL_COMMUNITY): Payer: Medicare Other

## 2021-02-07 ENCOUNTER — Inpatient Hospital Stay (HOSPITAL_COMMUNITY)
Admission: EM | Admit: 2021-02-07 | Discharge: 2021-02-19 | DRG: 542 | Disposition: A | Discharge status: Home Health | Payer: Medicare Other | Attending: Internal Medicine | Admitting: Internal Medicine

## 2021-02-07 DIAGNOSIS — Z681 Body mass index (BMI) 19 or less, adult: Secondary | ICD-10-CM | POA: Diagnosis not present

## 2021-02-07 DIAGNOSIS — K5900 Constipation, unspecified: Secondary | ICD-10-CM | POA: Diagnosis present

## 2021-02-07 DIAGNOSIS — J9811 Atelectasis: Secondary | ICD-10-CM | POA: Diagnosis not present

## 2021-02-07 DIAGNOSIS — F32A Depression, unspecified: Secondary | ICD-10-CM | POA: Diagnosis present

## 2021-02-07 DIAGNOSIS — T380X5A Adverse effect of glucocorticoids and synthetic analogues, initial encounter: Secondary | ICD-10-CM | POA: Diagnosis not present

## 2021-02-07 DIAGNOSIS — D638 Anemia in other chronic diseases classified elsewhere: Secondary | ICD-10-CM | POA: Diagnosis present

## 2021-02-07 DIAGNOSIS — G9389 Other specified disorders of brain: Secondary | ICD-10-CM | POA: Diagnosis not present

## 2021-02-07 DIAGNOSIS — Z79811 Long term (current) use of aromatase inhibitors: Secondary | ICD-10-CM

## 2021-02-07 DIAGNOSIS — Z20822 Contact with and (suspected) exposure to covid-19: Secondary | ICD-10-CM | POA: Diagnosis present

## 2021-02-07 DIAGNOSIS — I517 Cardiomegaly: Secondary | ICD-10-CM | POA: Diagnosis not present

## 2021-02-07 DIAGNOSIS — C50412 Malignant neoplasm of upper-outer quadrant of left female breast: Secondary | ICD-10-CM | POA: Diagnosis present

## 2021-02-07 DIAGNOSIS — Z90710 Acquired absence of both cervix and uterus: Secondary | ICD-10-CM

## 2021-02-07 DIAGNOSIS — E876 Hypokalemia: Secondary | ICD-10-CM | POA: Diagnosis present

## 2021-02-07 DIAGNOSIS — C50919 Malignant neoplasm of unspecified site of unspecified female breast: Secondary | ICD-10-CM

## 2021-02-07 DIAGNOSIS — C7951 Secondary malignant neoplasm of bone: Principal | ICD-10-CM | POA: Diagnosis present

## 2021-02-07 DIAGNOSIS — Z170 Estrogen receptor positive status [ER+]: Secondary | ICD-10-CM | POA: Diagnosis not present

## 2021-02-07 DIAGNOSIS — Z853 Personal history of malignant neoplasm of breast: Secondary | ICD-10-CM | POA: Diagnosis not present

## 2021-02-07 DIAGNOSIS — C799 Secondary malignant neoplasm of unspecified site: Secondary | ICD-10-CM | POA: Diagnosis not present

## 2021-02-07 DIAGNOSIS — R918 Other nonspecific abnormal finding of lung field: Secondary | ICD-10-CM | POA: Diagnosis not present

## 2021-02-07 DIAGNOSIS — R0602 Shortness of breath: Secondary | ICD-10-CM | POA: Diagnosis not present

## 2021-02-07 DIAGNOSIS — R338 Other retention of urine: Secondary | ICD-10-CM | POA: Diagnosis present

## 2021-02-07 DIAGNOSIS — Z515 Encounter for palliative care: Secondary | ICD-10-CM | POA: Diagnosis not present

## 2021-02-07 DIAGNOSIS — D539 Nutritional anemia, unspecified: Secondary | ICD-10-CM | POA: Diagnosis present

## 2021-02-07 DIAGNOSIS — I739 Peripheral vascular disease, unspecified: Secondary | ICD-10-CM | POA: Diagnosis present

## 2021-02-07 DIAGNOSIS — C7949 Secondary malignant neoplasm of other parts of nervous system: Secondary | ICD-10-CM | POA: Diagnosis present

## 2021-02-07 DIAGNOSIS — N281 Cyst of kidney, acquired: Secondary | ICD-10-CM | POA: Diagnosis not present

## 2021-02-07 DIAGNOSIS — Z66 Do not resuscitate: Secondary | ICD-10-CM | POA: Diagnosis present

## 2021-02-07 DIAGNOSIS — G319 Degenerative disease of nervous system, unspecified: Secondary | ICD-10-CM | POA: Diagnosis not present

## 2021-02-07 DIAGNOSIS — M47812 Spondylosis without myelopathy or radiculopathy, cervical region: Secondary | ICD-10-CM | POA: Diagnosis not present

## 2021-02-07 DIAGNOSIS — Z886 Allergy status to analgesic agent status: Secondary | ICD-10-CM

## 2021-02-07 DIAGNOSIS — I10 Essential (primary) hypertension: Secondary | ICD-10-CM | POA: Diagnosis present

## 2021-02-07 DIAGNOSIS — R5381 Other malaise: Secondary | ICD-10-CM | POA: Diagnosis present

## 2021-02-07 DIAGNOSIS — Z923 Personal history of irradiation: Secondary | ICD-10-CM

## 2021-02-07 DIAGNOSIS — G96198 Other disorders of meninges, not elsewhere classified: Secondary | ICD-10-CM | POA: Diagnosis not present

## 2021-02-07 DIAGNOSIS — Z79899 Other long term (current) drug therapy: Secondary | ICD-10-CM

## 2021-02-07 DIAGNOSIS — C719 Malignant neoplasm of brain, unspecified: Secondary | ICD-10-CM | POA: Diagnosis not present

## 2021-02-07 DIAGNOSIS — Z9862 Peripheral vascular angioplasty status: Secondary | ICD-10-CM

## 2021-02-07 DIAGNOSIS — D72829 Elevated white blood cell count, unspecified: Secondary | ICD-10-CM | POA: Diagnosis not present

## 2021-02-07 DIAGNOSIS — Z888 Allergy status to other drugs, medicaments and biological substances status: Secondary | ICD-10-CM

## 2021-02-07 DIAGNOSIS — E43 Unspecified severe protein-calorie malnutrition: Secondary | ICD-10-CM | POA: Diagnosis present

## 2021-02-07 DIAGNOSIS — G834 Cauda equina syndrome: Secondary | ICD-10-CM | POA: Diagnosis present

## 2021-02-07 DIAGNOSIS — R590 Localized enlarged lymph nodes: Secondary | ICD-10-CM | POA: Diagnosis not present

## 2021-02-07 DIAGNOSIS — E785 Hyperlipidemia, unspecified: Secondary | ICD-10-CM | POA: Diagnosis present

## 2021-02-07 DIAGNOSIS — R7401 Elevation of levels of liver transaminase levels: Secondary | ICD-10-CM | POA: Diagnosis present

## 2021-02-07 DIAGNOSIS — I700 Atherosclerosis of aorta: Secondary | ICD-10-CM | POA: Diagnosis not present

## 2021-02-07 DIAGNOSIS — G959 Disease of spinal cord, unspecified: Secondary | ICD-10-CM

## 2021-02-07 DIAGNOSIS — E782 Mixed hyperlipidemia: Secondary | ICD-10-CM

## 2021-02-07 DIAGNOSIS — F17210 Nicotine dependence, cigarettes, uncomplicated: Secondary | ICD-10-CM | POA: Diagnosis present

## 2021-02-07 DIAGNOSIS — C7931 Secondary malignant neoplasm of brain: Secondary | ICD-10-CM | POA: Diagnosis not present

## 2021-02-07 DIAGNOSIS — J439 Emphysema, unspecified: Secondary | ICD-10-CM | POA: Diagnosis not present

## 2021-02-07 DIAGNOSIS — J90 Pleural effusion, not elsewhere classified: Secondary | ICD-10-CM | POA: Diagnosis not present

## 2021-02-07 LAB — RESP PANEL BY RT-PCR (FLU A&B, COVID) ARPGX2
Influenza A by PCR: NEGATIVE
Influenza B by PCR: NEGATIVE
SARS Coronavirus 2 by RT PCR: NEGATIVE

## 2021-02-07 LAB — CBC WITH DIFFERENTIAL/PLATELET
Abs Immature Granulocytes: 0.1 10*3/uL — ABNORMAL HIGH (ref 0.00–0.07)
Basophils Absolute: 0 10*3/uL (ref 0.0–0.1)
Basophils Relative: 1 %
Eosinophils Absolute: 0.1 10*3/uL (ref 0.0–0.5)
Eosinophils Relative: 1 %
HCT: 24.8 % — ABNORMAL LOW (ref 36.0–46.0)
Hemoglobin: 7.6 g/dL — ABNORMAL LOW (ref 12.0–15.0)
Immature Granulocytes: 1 %
Lymphocytes Relative: 31 %
Lymphs Abs: 2.7 10*3/uL (ref 0.7–4.0)
MCH: 34.4 pg — ABNORMAL HIGH (ref 26.0–34.0)
MCHC: 30.6 g/dL (ref 30.0–36.0)
MCV: 112.2 fL — ABNORMAL HIGH (ref 80.0–100.0)
Monocytes Absolute: 1.4 10*3/uL — ABNORMAL HIGH (ref 0.1–1.0)
Monocytes Relative: 16 %
Neutro Abs: 4.4 10*3/uL (ref 1.7–7.7)
Neutrophils Relative %: 50 %
Platelets: 401 10*3/uL — ABNORMAL HIGH (ref 150–400)
RBC: 2.21 MIL/uL — ABNORMAL LOW (ref 3.87–5.11)
RDW: 19.2 % — ABNORMAL HIGH (ref 11.5–15.5)
WBC: 8.7 10*3/uL (ref 4.0–10.5)
nRBC: 5.1 % — ABNORMAL HIGH (ref 0.0–0.2)

## 2021-02-07 LAB — COMPREHENSIVE METABOLIC PANEL
ALT: 51 U/L — ABNORMAL HIGH (ref 0–44)
AST: 63 U/L — ABNORMAL HIGH (ref 15–41)
Albumin: 3.2 g/dL — ABNORMAL LOW (ref 3.5–5.0)
Alkaline Phosphatase: 153 U/L — ABNORMAL HIGH (ref 38–126)
Anion gap: 8 (ref 5–15)
BUN: 14 mg/dL (ref 8–23)
CO2: 26 mmol/L (ref 22–32)
Calcium: 9.2 mg/dL (ref 8.9–10.3)
Chloride: 102 mmol/L (ref 98–111)
Creatinine, Ser: 0.75 mg/dL (ref 0.44–1.00)
GFR, Estimated: >60 mL/min (ref >60)
Glucose, Bld: 113 mg/dL — ABNORMAL HIGH (ref 70–99)
Potassium: 3.4 mmol/L — ABNORMAL LOW (ref 3.5–5.1)
Sodium: 136 mmol/L (ref 135–145)
Total Bilirubin: 0.6 mg/dL (ref 0.3–1.2)
Total Protein: 7.7 g/dL (ref 6.5–8.1)

## 2021-02-07 LAB — CREATININE, SERUM
Creatinine, Ser: 0.8 mg/dL (ref 0.44–1.00)
GFR, Estimated: >60 mL/min (ref >60)

## 2021-02-07 LAB — MRSA NEXT GEN BY PCR, NASAL: MRSA by PCR Next Gen: NOT DETECTED

## 2021-02-07 LAB — I-STAT CREATININE, ED: Creatinine, Ser: 0.9 mg/dL (ref 0.44–1.00)

## 2021-02-07 MED ORDER — ALBUTEROL SULFATE (2.5 MG/3ML) 0.083% IN NEBU: 2.5000 mg | INHALATION_SOLUTION | RESPIRATORY_TRACT | Status: DC | PRN

## 2021-02-07 MED ORDER — CHOLECALCIFEROL 10 MCG (400 UNIT) PO TABS
400.0000 [IU] | ORAL_TABLET | Freq: Every day | ORAL | Status: DC
  Administered 2021-02-08 – 2021-02-19 (×12): 400 [IU] via ORAL
  Filled 2021-02-07 (×13): qty 1

## 2021-02-07 MED ORDER — IPRATROPIUM BROMIDE 0.02 % IN SOLN: 0.5000 mg | RESPIRATORY_TRACT | Status: DC | PRN

## 2021-02-07 MED ORDER — ACETAMINOPHEN 325 MG PO TABS
650.0000 mg | ORAL_TABLET | Freq: Four times a day (QID) | ORAL | Status: DC | PRN
  Filled 2021-02-07: qty 2

## 2021-02-07 MED ORDER — OXYCODONE-ACETAMINOPHEN 5-325 MG PO TABS
1.0000 | ORAL_TABLET | Freq: Once | ORAL | Status: AC
  Administered 2021-02-07: 1 via ORAL
  Filled 2021-02-07: qty 1

## 2021-02-07 MED ORDER — OXYCODONE-ACETAMINOPHEN 5-325 MG PO TABS
1.0000 | ORAL_TABLET | Freq: Four times a day (QID) | ORAL | Status: DC | PRN
  Administered 2021-02-08 – 2021-02-19 (×26): 1 via ORAL
  Filled 2021-02-07 (×29): qty 1

## 2021-02-07 MED ORDER — CHLORHEXIDINE GLUCONATE CLOTH 2 % EX PADS
6.0000 | MEDICATED_PAD | Freq: Every day | CUTANEOUS | Status: DC
  Administered 2021-02-08 – 2021-02-19 (×11): 6 via TOPICAL

## 2021-02-07 MED ORDER — MIRTAZAPINE 15 MG PO TABS
7.5000 mg | ORAL_TABLET | Freq: Every day | ORAL | Status: DC
  Administered 2021-02-07 – 2021-02-18 (×12): 7.5 mg via ORAL
  Filled 2021-02-07 (×12): qty 1

## 2021-02-07 MED ORDER — ONDANSETRON HCL 4 MG/2ML IJ SOLN
4.0000 mg | Freq: Four times a day (QID) | INTRAMUSCULAR | Status: DC | PRN
  Administered 2021-02-08 – 2021-02-17 (×3): 4 mg via INTRAVENOUS
  Filled 2021-02-07 (×3): qty 2

## 2021-02-07 MED ORDER — GABAPENTIN 300 MG PO CAPS: 300.0000 mg | ORAL_CAPSULE | Freq: Every day | ORAL | Status: DC

## 2021-02-07 MED ORDER — SORBITOL 70 % SOLN
30.0000 mL | Freq: Every day | Status: DC | PRN
  Filled 2021-02-07: qty 30

## 2021-02-07 MED ORDER — EVENING PRIMROSE OIL 1000 MG PO CAPS: 1000.0000 mg | ORAL_CAPSULE | Freq: Every day | ORAL | Status: DC

## 2021-02-07 MED ORDER — VENLAFAXINE HCL ER 37.5 MG PO CP24: 37.5000 mg | ORAL_CAPSULE | Freq: Every day | ORAL | Status: DC

## 2021-02-07 MED ORDER — DEXAMETHASONE SODIUM PHOSPHATE 4 MG/ML IJ SOLN
4.0000 mg | Freq: Four times a day (QID) | INTRAMUSCULAR | Status: DC
  Administered 2021-02-07 – 2021-02-13 (×22): 4 mg via INTRAVENOUS
  Filled 2021-02-07 (×23): qty 1

## 2021-02-07 MED ORDER — SODIUM CHLORIDE 0.9 % IV SOLN: INTRAVENOUS | Status: DC

## 2021-02-07 MED ORDER — POLYETHYLENE GLYCOL 3350 17 G PO PACK
17.0000 g | PACK | Freq: Every day | ORAL | Status: DC
  Administered 2021-02-08 – 2021-02-09 (×2): 17 g via ORAL
  Filled 2021-02-07 (×2): qty 1

## 2021-02-07 MED ORDER — LIDOCAINE 5 % EX PTCH
1.0000 | MEDICATED_PATCH | CUTANEOUS | Status: DC
  Administered 2021-02-08 – 2021-02-19 (×12): 1 via TRANSDERMAL
  Filled 2021-02-07 (×13): qty 1

## 2021-02-07 MED ORDER — POTASSIUM CHLORIDE CRYS ER 10 MEQ PO TBCR
40.0000 meq | EXTENDED_RELEASE_TABLET | Freq: Once | ORAL | Status: AC
  Administered 2021-02-07: 40 meq via ORAL
  Filled 2021-02-07: qty 4

## 2021-02-07 MED ORDER — DEXAMETHASONE SODIUM PHOSPHATE 4 MG/ML IJ SOLN: 4.0000 mg | INTRAMUSCULAR | Status: DC

## 2021-02-07 MED ORDER — VITAMIN E 180 MG (400 UNIT) PO CAPS
400.0000 [IU] | ORAL_CAPSULE | Freq: Every day | ORAL | Status: DC
  Administered 2021-02-08 – 2021-02-19 (×12): 400 [IU] via ORAL
  Filled 2021-02-07 (×2): qty 4
  Filled 2021-02-07: qty 1
  Filled 2021-02-07: qty 4
  Filled 2021-02-07: qty 1
  Filled 2021-02-07: qty 4
  Filled 2021-02-07 (×5): qty 1
  Filled 2021-02-07: qty 4
  Filled 2021-02-07: qty 1
  Filled 2021-02-07: qty 4
  Filled 2021-02-07: qty 1

## 2021-02-07 MED ORDER — ANASTROZOLE 1 MG PO TABS
1.0000 mg | ORAL_TABLET | Freq: Every day | ORAL | Status: DC
  Administered 2021-02-08 – 2021-02-19 (×12): 1 mg via ORAL
  Filled 2021-02-07 (×13): qty 1

## 2021-02-07 MED ORDER — IPRATROPIUM-ALBUTEROL 0.5-2.5 (3) MG/3ML IN SOLN: 3.0000 mL | RESPIRATORY_TRACT | Status: DC | PRN

## 2021-02-07 MED ORDER — ACETAMINOPHEN 650 MG RE SUPP: 650.0000 mg | Freq: Four times a day (QID) | RECTAL | Status: DC | PRN

## 2021-02-07 MED ORDER — OXYCODONE HCL 5 MG PO TABS
5.0000 mg | ORAL_TABLET | ORAL | Status: DC | PRN
  Administered 2021-02-07 – 2021-02-19 (×16): 5 mg via ORAL
  Filled 2021-02-07 (×16): qty 1

## 2021-02-07 MED ORDER — GADOBUTROL 1 MMOL/ML IV SOLN
5.0000 mL | Freq: Once | INTRAVENOUS | Status: AC | PRN
  Administered 2021-02-07: 5 mL via INTRAVENOUS

## 2021-02-07 MED ORDER — SENNA 8.6 MG PO TABS
1.0000 | ORAL_TABLET | Freq: Two times a day (BID) | ORAL | Status: DC
  Administered 2021-02-07 – 2021-02-09 (×4): 8.6 mg via ORAL
  Filled 2021-02-07 (×4): qty 1

## 2021-02-07 MED ORDER — ONDANSETRON HCL 4 MG PO TABS: 4.0000 mg | ORAL_TABLET | Freq: Four times a day (QID) | ORAL | Status: DC | PRN

## 2021-02-07 MED ORDER — FOLIC ACID 1 MG PO TABS
0.5000 mg | ORAL_TABLET | Freq: Every day | ORAL | Status: DC
  Administered 2021-02-08 – 2021-02-19 (×12): 0.5 mg via ORAL
  Filled 2021-02-07 (×12): qty 1

## 2021-02-07 NOTE — H&P (Addendum)
History and Physical    Leslie [name] YFV:[id number] DOB: 12-Sep-1945 DOA: 02/07/2021  PCP: Mayra Neer, MD  Patient coming from: Home  I have personally briefly reviewed patient's old medical records in [place]  Chief Complaint: Urinary retention/cannot pee  HPI: Leslie [name] is a 76 y.o. 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  Patient does endorse 3 to 4-week history of numbness in the groin area as well as some intermittent bowel incontinence and bladder incontinence.  Patient denies any fevers, no chest pain, no shortness of breath.  Patient does endorse some feelings of cold from time to time.  Patient with complaints of suprapubic abdominal pain which improved after Foley catheter placed in the ED.  Patient does endorse constipation, saddle anesthesia, weight loss of 15 pounds over the past year.  Patient denies any dysuria, no diarrhea.  Patient complains of lower back pain intermittently with shooting pain down bilateral lower extremities and uses a cane for ambulation.  Patient stated recently seen in the ED for her back pain and placed on steroids.  ED Course: Patient seen in the ED, comprehensive metabolic profile with a potassium of 3.4, alk phosphatase 163, albumin 3.2, AST of 63, ALT of 51 otherwise within normal limits.  CBC with a hemoglobin of 7.6, platelet of 4 1 otherwise within normal limits.  COVID-19 PCR negative.  Chest x-ray with mild diffuse bilateral interstitial pulmonary opacity most consistent with edema, no focal airspace opacity, small right pleural effusion.  Unchanged elevation of left hemidiaphragm with associated atelectasis or consolidation.  MRI of the T and L-spine done with known widespread  metastatic disease throughout the thoracic and lumbar spine, epidural tumor greatest in the lower thoracic spine with moderate spinal stenosis at T10-11, abnormal enhancement relatively diffusely along the surface of the thoracic spinal cord with suspected mild involvement of the cauda equina nerve roots compatible with left meningeal disease.  Edema in the distal spinal cord/conus as well as edema and no syrinx in the upper thoracic cord extending into cervical region.  Widespread edema/enhancement involving the Thoracics paraspinal musculature and bilateral gluteal maxillectomy, potential denervation or rhabdomyolysis.  Small left pleural effusion.  Review of Systems: As per HPI otherwise all other systems reviewed and are negative.  Past Medical History:  Diagnosis Date   Benign breast cyst in female    Right breast   Cancer of left breast ([place]) 2010   Depression    History of blood transfusion    "related to tubal pregnancy"   Hypertension    PAD (peripheral artery disease) ([place])     Past Surgical History:  Procedure Laterality Date   ABDOMINAL HYSTERECTOMY  1980s   BREAST CYST EXCISION Right ~ 2005   BREAST LUMPECTOMY Left 11/23/2008   ECTOPIC PREGNANCY SURGERY  1966   LOWER EXTREMITY ANGIOGRAM  06/19/2017   LOWER EXTREMITY ANGIOGRAPHY N/A 06/19/2017   Procedure: LOWER EXTREMITY ANGIOGRAPHY;  Surgeon: Lorretta Harp, MD;  Location: [facility];  Service: Cardiovascular;  Laterality: N/A;   PERIPHERAL VASCULAR BALLOON ANGIOPLASTY Left 06/19/2017   SFA    PERIPHERAL VASCULAR BALLOON ANGIOPLASTY  06/19/2017   Procedure: PERIPHERAL VASCULAR BALLOON ANGIOPLASTY;  Surgeon: Lorretta Harp, MD;  Location: [facility];  Service: Cardiovascular;;  SFA left  Social History  reports that she has been smoking cigarettes. She has a 22.50 pack-year smoking history. She has never used smokeless tobacco. She reports current alcohol use. She reports that she does not use  drugs.  Allergies  Allergen Reactions   Aspirin Nausea Only and Nausea And Vomiting   Cetirizine Other (See Comments)    Chest Pain Other reaction(s): chest pain    Family History  Problem Relation Age of Onset   Hypertension Mother    Seizures Mother    Heart disease Father    Hypertension Sister    Mother deceased in her 5s patient unsure of what she passed from however did state mother had seizures.  Father alive age 79 with a history of heart murmur.  Prior to Admission medications   Medication Sig Start Date End Date Taking? Authorizing Provider  acetaminophen (TYLENOL) 500 MG tablet Take 500-1,000 mg by mouth every 6 (six) hours as needed for moderate pain or headache.    [provider]  anastrozole (ARIMIDEX) 1 MG tablet Take 1 tablet (1 mg total) by mouth daily. 12/12/20   Magrinat, Virgie Dad, MD  atenolol-chlorthalidone (TENORETIC) 50-25 MG per tablet Take 0.5 tablets by mouth daily.     [provider]  cholecalciferol (VITAMIN D) 400 units TABS tablet Take 400 Units by mouth daily.    [provider]  Evening Primrose Oil 1000 MG CAPS Take 1,000 mg by mouth daily.    [provider]  fluocinonide cream (LIDEX) 5.17 % Apply 1 application topically 2 (two) times daily as needed (for break outs).    [provider]  folic acid (FOLVITE) 001 MCG tablet Take 400 mcg by mouth daily.     [provider]  gabapentin (NEURONTIN) 300 MG capsule Take 1 capsule (300 mg total) by mouth at bedtime. Patient not taking: Reported on 10/25/2020 12/30/19   Magrinat, Virgie Dad, MD  lidocaine (LIDODERM) 5 % Place 1 patch onto the skin daily. Remove & Discard patch within 12 hours or as directed by MD 11/09/20   Magrinat, Virgie Dad, MD  mirtazapine (REMERON) 15 MG tablet Take 0.5 tablets (7.5 mg total) by mouth at bedtime. 01/04/21   Magrinat, Virgie Dad, MD  Multiple Vitamin (MULTIVITAMIN PO) Take 1 tablet by mouth daily.     [provider]  ondansetron (ZOFRAN) 8 MG tablet Take by mouth every 8 (eight) hours as needed for nausea or vomiting. 11/09/20   Magrinat, Virgie Dad, MD  oxyCODONE-acetaminophen (PERCOCET/ROXICET) 5-325 MG tablet Take 1 tablet by mouth every 6 (six) hours as needed for severe pain. 02/05/21   Hayden Rasmussen, MD  palbociclib [facility]) 125 MG tablet Take 1 tablet (125 mg total) by mouth daily. Take for 21 days on, 7 days off, repeat every 28 days. 07/18/20   Magrinat, Virgie Dad, MD  rosuvastatin (CRESTOR) 10 MG tablet Take 1 tablet (10 mg total) by mouth daily. 10/25/20   Lorretta Harp, MD  triamcinolone cream (KENALOG) 0.1 % Apply topically. 05/07/19   [provider]  venlafaxine XR (EFFEXOR-XR) 37.5 MG 24 hr capsule Take 1 capsule (37.5 mg total) by mouth daily with breakfast. 03/14/20   Magrinat, Virgie Dad, MD  vitamin E 400 UNIT capsule Take 400 Units by mouth daily.    [provider]    Physical Exam: Vitals:   02/07/21 1615 02/07/21 1630 02/07/21 1700 02/07/21 1730  BP: 136/66 139/88 (!) 143/71 138/77  Pulse: 99 97 96 100  Resp:  $'16 18 18 17  'i$ Temp:      TempSrc:      SpO2: 100% 100% 100% 99%    Constitutional: NAD, calm, comfortable Vitals:   02/07/21 1615 02/07/21 1630 02/07/21 1700 02/07/21 1730  BP: 136/66 139/88 (!) 143/71 138/77  Pulse: 99 97 96 100  Resp: $Remo'16 18 18 17  'RrUAW$ Temp:      TempSrc:      SpO2: 100% 100% 100% 99%   Eyes: PERRL, lids and conjunctivae normal ENMT: Mucous membranes are moist. Posterior pharynx clear of any exudate or lesions.Normal dentition.  Neck: normal, supple, no masses, no thyromegaly Respiratory: clear to auscultation bilaterally, no wheezing, no crackles. Normal respiratory effort. No accessory muscle use.  Cardiovascular: Regular rate and rhythm, no murmurs / rubs / gallops. No extremity edema. 2+ pedal pulses. No carotid bruits.  Abdomen: no tenderness, no masses palpated. No hepatosplenomegaly. Bowel sounds positive.   Musculoskeletal: no clubbing / cyanosis. No joint deformity upper and lower extremities. Good ROM, no contractures. Normal muscle tone.  Genitourinary: Positive saddle anesthesia present by EDP, rectal tone appears present but diminished in tone per EDP. Skin: no rashes, lesions, ulcers. No induration Neurologic: CN 2-12 grossly intact.  Per EDP, positive saddle anesthesia present.  Strength 4-5/5 in bilateral lower extremities.  5/5 bilateral upper extremity strength..  Psychiatric: Normal judgment and insight. Alert and oriented x 3. Normal mood.   Labs on Admission: I have personally reviewed following labs and imaging studies  CBC: Recent Labs  Lab 02/05/21 1405 02/07/21 1341  WBC 10.3 8.7  NEUTROABS 5.9 4.4  HGB 8.3* 7.6*  HCT 26.3* 24.8*  MCV 112.4* 112.2*  PLT 458* 401*    Basic Metabolic Panel: Recent Labs  Lab 02/05/21 1405 02/07/21 1234 02/07/21 1239 02/07/21 1341  NA 135  --   --  136  K 4.0  --   --  3.4*  CL 100  --   --  102  CO2 25  --   --  26  GLUCOSE 100*  --   --  113*  BUN 11  --   --  14  CREATININE 0.83 0.90 0.80 0.75  CALCIUM 9.1  --   --  9.2    GFR: CrCl cannot be calculated (Unknown ideal weight.).  Liver Function Tests: Recent Labs  Lab 02/07/21 1341  AST 63*  ALT 51*  ALKPHOS 153*  BILITOT 0.6  PROT 7.7  ALBUMIN 3.2*    Urine analysis:    Component Value Date/Time   COLORURINE YELLOW 08/17/2010 1105   APPEARANCEUR CLEAR 08/17/2010 1105   LABSPEC 1.017 08/17/2010 1105   PHURINE 8.0 08/17/2010 1105   GLUCOSEU NEGATIVE 08/17/2010 1105   HGBUR NEGATIVE 08/17/2010 1105   BILIRUBINUR NEGATIVE 08/17/2010 1105   KETONESUR NEGATIVE 08/17/2010 1105   PROTEINUR NEGATIVE 08/17/2010 1105   UROBILINOGEN 0.2 08/17/2010 1105   NITRITE NEGATIVE 08/17/2010 1105   LEUKOCYTESUR NEGATIVE 08/17/2010 1105    Radiological Exams on Admission: MR THORACIC SPINE W WO CONTRAST  Result Date: 02/07/2021 CLINICAL DATA:  Back pain. Difficulty  with urination. History of breast cancer with widespread osseous metastatic disease. EXAM: MRI THORACIC AND LUMBAR SPINE WITHOUT AND WITH CONTRAST TECHNIQUE: Multiplanar and multiecho pulse sequences of the thoracic and lumbar spine were obtained without and with intravenous contrast. CONTRAST:  50mL GADAVIST GADOBUTROL 1 MMOL/ML IV SOLN COMPARISON:  Lumbar spine radiographs 02/05/2021. Nuclear medicine whole-body bone scan 01/03/2021. Chest CT 01/03/2021. Lumbar spine MRI 10/04/2019. FINDINGS: MRI THORACIC SPINE  FINDINGS The study is moderately motion degraded, greatest on axial sequences. Alignment: Mild lower thoracic dextroscoliosis. No significant listhesis. Vertebrae: Known widespread metastatic disease throughout the vertebral bodies and posterior elements at every thoracic level as well as in the included cervical spine. Pathologic T4 compression fracture with mild vertebral body height loss, similar to the prior chest CT. A T9 superior endplate Schmorl's node is unchanged. There is evidence of epidural tumor in the lower thoracic spine, greatest at T10-11 where there is resultant moderate spinal stenosis. The craniocaudal extent of epidural tumor is difficult to precisely determine although it may extend cranially to approximately T7-8. Cord: Abnormal enhancement along the surface of the spinal cord at least throughout the mid and upper thoracic spine with extension towards the cervical region. Abnormal T2 hyperintensity centrally in the upper and, to a lesser extent, mid thoracic spinal cord which could reflect edema or a syrinx extending towards the cervical region and measuring up to approximately 4 mm in diameter near the cervicothoracic junction. Additional edema in the lower thoracic spinal cord. Paraspinal and other soft tissues: Small left pleural effusion, larger than on the prior chest CT. Widespread STIR hyperintensity and enhancement of the left greater than right posterior paraspinal  musculature. Disc levels: Disc degeneration greatest at T10-11 and T11-12 where there is asymmetrically severe left-sided disc space narrowing. Up to moderate lower thoracic spinal stenosis due to epidural tumor. MRI LUMBAR SPINE FINDINGS Segmentation:  Standard. Alignment: Moderate lumbar levoscoliosis. No significant listhesis. Vertebrae: Chronic L4 Schmorl's nodes and mild chronic L3 compression fracture. Known widespread osseous metastatic disease throughout the lumbar spine and included pelvis. Epidural tumor at the thoracolumbar junction as noted above on the thoracic spine MRI. Mildly prominent epidural enhancement throughout the lumbar spine more caudally, indeterminate for epidural tumor but without a dominant epidural mass in the lumbar spine. Epidural tumor is suspected in the sacrum involving left-sided neural foramina. Prominent perineural cysts at multiple levels as previously seen. Conus medullaris: Extends to the T12-L1 level and demonstrates edema as noted above. There is abnormal nodular enhancement along the surface of the distal spinal cord/conus, and there also appears to be mild enhancement of cauda equina nerve roots in the lower lumbar and sacral canal. Paraspinal and other soft tissues: Chronic asymmetric left psoas and left greater than right posterior paraspinal muscle atrophy. Partially visualized extensive gluteal musculature enhancement bilaterally. Small right renal cyst. Disc levels: L1-2: Disc bulging and mild facet and ligamentum flavum hypertrophy result in moderate right lateral recess stenosis and moderate right and mild left neural foraminal stenosis, similar to the prior MRI. Borderline to mild spinal stenosis. L2-3: Disc bulging and moderate facet and ligamentum flavum hypertrophy result in mild right lateral recess stenosis and severe right neural foraminal stenosis, similar to the prior MRI. Borderline spinal stenosis. L3-4: Right eccentric disc bulging and mild-to-moderate  facet and ligamentum flavum hypertrophy result in borderline to mild right lateral recess stenosis and mild right neural foraminal stenosis without spinal stenosis, similar to the prior MRI. L4-5: Minimal disc bulging without stenosis. L5-S1: Negative. IMPRESSION: 1. Known widespread metastatic disease throughout the thoracic and lumbar spine. 2. 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 3. Abnormal enhancement relatively diffusely along the surface of the thoracic spinal cord with suspected mild involvement of the cauda equina nerve roots compatible with left meningeal disease. 4. Edema in the distal spinal cord/conus as well as edema and/or syrinx in the upper thoracic cord extending into the  cervical region. 5. Widespread edema/enhancement involving the thoracic paraspinal musculature and bilateral gluteal musculature, potentially denervation or rhabdomyolysis. 6. Small left pleural effusion. Electronically Signed   By: Logan Bores M.D.   On: 02/07/2021 16:11   MR Lumbar Spine W Wo Contrast  Result Date: 02/07/2021 CLINICAL DATA:  Back pain. Difficulty with urination. History of breast cancer with widespread osseous metastatic disease. EXAM: MRI THORACIC AND LUMBAR SPINE WITHOUT AND WITH CONTRAST TECHNIQUE: Multiplanar and multiecho pulse sequences of the thoracic and lumbar spine were obtained without and with intravenous contrast. CONTRAST:  57mL GADAVIST GADOBUTROL 1 MMOL/ML IV SOLN COMPARISON:  Lumbar spine radiographs 02/05/2021. Nuclear medicine whole-body bone scan 01/03/2021. Chest CT 01/03/2021. Lumbar spine MRI 10/04/2019. FINDINGS: MRI THORACIC SPINE FINDINGS The study is moderately motion degraded, greatest on axial sequences. Alignment: Mild lower thoracic dextroscoliosis. No significant listhesis. Vertebrae: Known widespread metastatic disease throughout the vertebral bodies and posterior elements at every thoracic level as well as in the  included cervical spine. Pathologic T4 compression fracture with mild vertebral body height loss, similar to the prior chest CT. A T9 superior endplate Schmorl's node is unchanged. There is evidence of epidural tumor in the lower thoracic spine, greatest at T10-11 where there is resultant moderate spinal stenosis. The craniocaudal extent of epidural tumor is difficult to precisely determine although it may extend cranially to approximately T7-8. Cord: Abnormal enhancement along the surface of the spinal cord at least throughout the mid and upper thoracic spine with extension towards the cervical region. Abnormal T2 hyperintensity centrally in the upper and, to a lesser extent, mid thoracic spinal cord which could reflect edema or a syrinx extending towards the cervical region and measuring up to approximately 4 mm in diameter near the cervicothoracic junction. Additional edema in the lower thoracic spinal cord. Paraspinal and other soft tissues: Small left pleural effusion, larger than on the prior chest CT. Widespread STIR hyperintensity and enhancement of the left greater than right posterior paraspinal musculature. Disc levels: Disc degeneration greatest at T10-11 and T11-12 where there is asymmetrically severe left-sided disc space narrowing. Up to moderate lower thoracic spinal stenosis due to epidural tumor. MRI LUMBAR SPINE FINDINGS Segmentation:  Standard. Alignment: Moderate lumbar levoscoliosis. No significant listhesis. Vertebrae: Chronic L4 Schmorl's nodes and mild chronic L3 compression fracture. Known widespread osseous metastatic disease throughout the lumbar spine and included pelvis. Epidural tumor at the thoracolumbar junction as noted above on the thoracic spine MRI. Mildly prominent epidural enhancement throughout the lumbar spine more caudally, indeterminate for epidural tumor but without a dominant epidural mass in the lumbar spine. Epidural tumor is suspected in the sacrum involving left-sided  neural foramina. Prominent perineural cysts at multiple levels as previously seen. Conus medullaris: Extends to the T12-L1 level and demonstrates edema as noted above. There is abnormal nodular enhancement along the surface of the distal spinal cord/conus, and there also appears to be mild enhancement of cauda equina nerve roots in the lower lumbar and sacral canal. Paraspinal and other soft tissues: Chronic asymmetric left psoas and left greater than right posterior paraspinal muscle atrophy. Partially visualized extensive gluteal musculature enhancement bilaterally. Small right renal cyst. Disc levels: L1-2: Disc bulging and mild facet and ligamentum flavum hypertrophy result in moderate right lateral recess stenosis and moderate right and mild left neural foraminal stenosis, similar to the prior MRI. Borderline to mild spinal stenosis. L2-3: Disc bulging and moderate facet and ligamentum flavum hypertrophy result in mild right lateral recess stenosis and severe right neural foraminal stenosis,  similar to the prior MRI. Borderline spinal stenosis. L3-4: Right eccentric disc bulging and mild-to-moderate facet and ligamentum flavum hypertrophy result in borderline to mild right lateral recess stenosis and mild right neural foraminal stenosis without spinal stenosis, similar to the prior MRI. L4-5: Minimal disc bulging without stenosis. L5-S1: Negative. IMPRESSION: 1. Known widespread metastatic disease throughout the thoracic and lumbar spine. 2. 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 3. Abnormal enhancement relatively diffusely along the surface of the thoracic spinal cord with suspected mild involvement of the cauda equina nerve roots compatible with left meningeal disease. 4. Edema in the distal spinal cord/conus as well as edema and/or syrinx in the upper thoracic cord extending into the cervical region. 5. Widespread edema/enhancement involving the thoracic  paraspinal musculature and bilateral gluteal musculature, potentially denervation or rhabdomyolysis. 6. Small left pleural effusion. Electronically Signed   By: Sebastian Ache M.D.   On: 02/07/2021 16:11   DG Chest Portable 1 View  Result Date: 02/07/2021 CLINICAL DATA:  Shortness of breath, difficulty urinating EXAM: PORTABLE CHEST 1 VIEW COMPARISON:  10/11/2020 FINDINGS: Mild cardiomegaly. Mild, diffuse bilateral interstitial pulmonary opacity. Small right pleural effusion. Unchanged elevation of the left hemidiaphragm with associated atelectasis or consolidation. The visualized skeletal structures are unremarkable. IMPRESSION: 1. Mild, diffuse bilateral interstitial pulmonary opacity, most consistent with edema. No focal airspace opacity. 2. Small right pleural effusion. 3. Unchanged elevation of the left hemidiaphragm with associated atelectasis or consolidation. Electronically Signed   By: Jearld Lesch M.D.   On: 02/07/2021 16:44    EKG: Independently reviewed. 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 -MRI of the L and T-spine concerning for multiple epidural tumors/disease in the lower T-spine with moderate stenosis at T10.  Findings concerning for mild cauda equina. -Patient seen in consultation by neurosurgery, Dr. 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  Concerned that patient may also have some spinal stenosis in the lumbar region causing the claudication-like  symptoms, bladder dysfunction may be more related to edema within the conus down from compression at T10 per neurosurgery. -Neurosurgery recommended high-volume lumbar puncture for cytology to rule out leptomeningeal disease, oncology, radiation oncology consultations. -Spoke with medical oncology, neuro-oncology who will formally see the patient in consultation tomorrow. -LP ordered under fluoroscopy per IR, per neurosurgical recommendations. -Place on Decadron 4 mg IV every 6 hours. -Supportive care.  2.  Urinary retention -Likely secondary to problem #1. -Foley catheter placed in the ED after bladder scan showed 700 cc of urinary retention with good urine output. -See #1. -May need urology input.  3.  Hypokalemia -K-Dur 40 mEq p.o. x1.  4.  Metastatic breast cancer -Resume home regimen of Arimidex. -Consult with oncology.  5.  Hypertension -Patient states was on antihypertensive medications but PCP discontinued them. -Blood pressure stable. -Follow.  6.  Hyperlipidemia -Stated was on a statin however held per her oncology recommendations. -Continue to hold statin.  7.  Transaminitis -Likely secondary to metastatic disease. -Repeat labs in the AM.  DVT prophylaxis: SCDs Code Status:   DNR Family Communication:  Updated patient.  No family at bedside. Disposition Plan:   Patient is from:  Home  Anticipated DC to:  Hopefully home  Anticipated DC date:  TBD  Anticipated DC barriers: Clinical improved  Consults called:  Neurosurgery: Dr. Jones/neurooncology Dr. Vaslow./Medical oncology: Dr.Iruka Admission status:  Admit to inpatient.  Severity of Illness: The appropriate patient status for this patient is INPATIENT. Inpatient status is judged to be reasonable and necessary in order to provide the required intensity of service to ensure the patient's safety. The patient's presenting symptoms, physical exam findings, and initial radiographic and laboratory data in the  context of their chronic comorbidities is felt to place them at high risk for further clinical deterioration. Furthermore, it is not anticipated that the patient will be medically stable for discharge from the hospital within 2 midnights of admission.   * I certify that at the point of admission it is my clinical judgment that the patient will require inpatient hospital care spanning beyond 2 midnights from the point of admission due to high intensity of service, high risk for further deterioration and high frequency of surveillance required.*    Irine Seal MD [facility]  How to contact the [facility] Attending or Consulting provider [place] or covering provider during after hours [place], for this patient?   Check the care team in [facility] and look for a) attending/consulting [facility] provider listed and b) the [facility] team listed Log into [email or web address] and use [facility] universal password to access. If you do not have the password, please contact the hospital operator. Locate the [facility] provider you are looking for under [facility] and page to a number that you can be directly reached. If you still have difficulty reaching the provider, please page the [facility] (Director on Call) for the Hospitalists listed on amion for assistance.  02/07/2021, 6:40 PM

## 2021-02-07 NOTE — Consult Note (Signed)
Reason for Consult: spine mets Referring Physician: EDP  Leslie [name] is an 76 y.o. [name].   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  Sounds like her first surgery was in 2010.  She has known spinal metastasis.  She saw Dr. Lynann Bologna for back pain in the summer 2021.  He believes he found the cancer and then referred her to oncology.  She has been treated by Dr. Jana Hakim since that time.  It sounds like about 2 weeks ago she developed some saddle anesthesia.  About 48 hours ago she started having trouble with urination.  She denies weakness in the legs or change in gait.  She does have back pain and I think was seen recently and given prednisone.  She does have some aching down the legs.  She had an MRI of the thoracic and lumbar spine with and without contrast tonight at the emergency department and this shows very diffuse disease with moderate canal stenosis at T10-11 but signal change throughout the cord and enhancement of the surface of the cord through multiple thoracic and cervical levels with edema in the conus.  Lumbar spine has multiple metastasis to the sacrum and basically in every single vertebral body.  Past Medical History:  Diagnosis Date   Benign breast cyst in [name]    Right breast   Cancer of left breast ([place]) 2010   Depression    History of blood transfusion    "related to tubal pregnancy"   Hypertension    PAD (peripheral artery disease) ([place])     Past Surgical History:  Procedure Laterality Date   ABDOMINAL HYSTERECTOMY  1980s   BREAST CYST EXCISION Right ~ 2005   BREAST LUMPECTOMY Left 11/23/2008   ECTOPIC PREGNANCY SURGERY  1966   LOWER EXTREMITY ANGIOGRAM  06/19/2017   LOWER EXTREMITY ANGIOGRAPHY N/A 06/19/2017   Procedure: LOWER EXTREMITY ANGIOGRAPHY;  Surgeon: Lorretta Harp, MD;  Location: [facility];  Service: Cardiovascular;  Laterality: N/A;   PERIPHERAL VASCULAR BALLOON  ANGIOPLASTY Left 06/19/2017   SFA    PERIPHERAL VASCULAR BALLOON ANGIOPLASTY  06/19/2017   Procedure: PERIPHERAL VASCULAR BALLOON ANGIOPLASTY;  Surgeon: Lorretta Harp, MD;  Location: [facility];  Service: Cardiovascular;;  SFA left    Allergies  Allergen Reactions   Aspirin Nausea Only and Nausea And Vomiting   Cetirizine Other (See Comments)    Chest Pain Other reaction(s): chest pain    Social History   Tobacco Use   Smoking status: Every Day    Packs/day: 0.50    Years: 45.00    Pack years: 22.50    Types: Cigarettes   Smokeless tobacco: Never  Substance Use Topics   Alcohol use: Yes    Comment: 06/19/2017 "glass of wine 3-4 times/year"    Family History  Problem Relation Age of Onset   Hypertension Mother    Seizures Mother    Heart disease Father    Hypertension Sister      Review of Systems  Positive ROS: As above  All other systems have been reviewed and were otherwise negative with the exception of those mentioned in the HPI and as above.  Objective: Vital signs in last 24 hours: Temp:  [98 F (36.7 C)] 98 F (36.7 C) (01/18 1131) Pulse Rate:  [88-105] 100 (01/18 1730) Resp:  [16-18] 17 (01/18 1730) BP: (110-143)/(65-88) 138/77 (01/18 1730) SpO2:  [85 %-100 %] 99 % (01/18 1730)  General Appearance: Alert, cooperative, no distress, appears stated age Head: Normocephalic, without obvious abnormality, atraumatic Eyes: PERRL, conjunctiva/corneas clear, EOM's intact   Neck: Supple, symmetrical Lungs: respirations unlabored Heart: Regular rate and rhythm Abdomen: Softy Extremities: Extremities normal, atraumatic, no cyanosis or edema   NEUROLOGIC:   Mental status: A&O x4, no aphasia, good attention span, Memory and fund of knowledge appear to be okay Motor Exam - grossly normal, she is thin but seems to have good strength in the legs Sensory Exam - grossly normal Reflexes:  Coordination - grossly normal Gait -not tested Balance - grossly  normal Cranial Nerves: I: smell Not tested  II: visual acuity  OS: na    OD: na  II: visual fields Full to confrontation  II: pupils Equal, round, reactive to light  III,VII: ptosis None  III,IV,VI: extraocular muscles  Full ROM  V: mastication Normal  V: facial light touch sensation  Normal  V,VII: corneal reflex  Present  VII: facial muscle function - upper  Normal  VII: facial muscle function - lower Normal  VIII: hearing Not tested  IX: soft palate elevation  Normal  IX,X: gag reflex Present  XI: trapezius strength  5/5  XI: sternocleidomastoid strength 5/5  XI: neck flexion strength  5/5  XII: tongue strength  Normal    Data Review Lab Results  Component Value Date   WBC 8.7 02/07/2021   HGB 7.6 (L) 02/07/2021   HCT 24.8 (L) 02/07/2021   MCV 112.2 (H) 02/07/2021   PLT 401 (H) 02/07/2021   Lab Results  Component Value Date   NA 136 02/07/2021   K 3.4 (L) 02/07/2021   CL 102 02/07/2021   CO2 26 02/07/2021   BUN 14 02/07/2021   CREATININE 0.75 02/07/2021   GLUCOSE 113 (H) 02/07/2021   Lab Results  Component Value Date   INR 1.0 06/04/2017    Radiology: MR THORACIC SPINE W WO CONTRAST  Result Date: 02/07/2021 CLINICAL DATA:  Back pain. Difficulty with urination. History of breast cancer with widespread osseous metastatic disease. EXAM: MRI THORACIC AND LUMBAR SPINE WITHOUT AND WITH CONTRAST TECHNIQUE: Multiplanar and multiecho pulse sequences of the thoracic and lumbar spine were obtained without and with intravenous contrast. CONTRAST:  54mL GADAVIST GADOBUTROL 1 MMOL/ML IV SOLN COMPARISON:  Lumbar spine radiographs 02/05/2021. Nuclear medicine whole-body bone scan 01/03/2021. Chest CT 01/03/2021. Lumbar spine MRI 10/04/2019. FINDINGS: MRI THORACIC SPINE FINDINGS The study is moderately motion degraded, greatest on axial sequences. Alignment: Mild lower thoracic dextroscoliosis. No significant listhesis. Vertebrae: Known widespread metastatic disease throughout the  vertebral bodies and posterior elements at every thoracic level as well as in the included cervical spine. Pathologic T4 compression fracture with mild vertebral body height loss, similar to the prior chest CT. A T9 superior endplate Schmorl's node is unchanged. There is evidence of epidural tumor in the lower thoracic spine, greatest at T10-11 where there is resultant moderate spinal stenosis. The craniocaudal extent of epidural tumor is difficult to precisely determine although it may extend cranially to approximately T7-8. Cord: Abnormal enhancement along the surface of the spinal cord at least throughout the mid and upper thoracic spine with extension towards the cervical region. Abnormal T2 hyperintensity centrally in the upper and, to a lesser extent, mid thoracic spinal cord which could reflect edema or a syrinx extending towards the cervical region and measuring up to approximately 4 mm in diameter near the cervicothoracic junction. Additional edema in the lower thoracic spinal cord. Paraspinal and other soft tissues:  Small left pleural effusion, larger than on the prior chest CT. Widespread STIR hyperintensity and enhancement of the left greater than right posterior paraspinal musculature. Disc levels: Disc degeneration greatest at T10-11 and T11-12 where there is asymmetrically severe left-sided disc space narrowing. Up to moderate lower thoracic spinal stenosis due to epidural tumor. MRI LUMBAR SPINE FINDINGS Segmentation:  Standard. Alignment: Moderate lumbar levoscoliosis. No significant listhesis. Vertebrae: Chronic L4 Schmorl's nodes and mild chronic L3 compression fracture. Known widespread osseous metastatic disease throughout the lumbar spine and included pelvis. Epidural tumor at the thoracolumbar junction as noted above on the thoracic spine MRI. Mildly prominent epidural enhancement throughout the lumbar spine more caudally, indeterminate for epidural tumor but without a dominant epidural mass  in the lumbar spine. Epidural tumor is suspected in the sacrum involving left-sided neural foramina. Prominent perineural cysts at multiple levels as previously seen. Conus medullaris: Extends to the T12-L1 level and demonstrates edema as noted above. There is abnormal nodular enhancement along the surface of the distal spinal cord/conus, and there also appears to be mild enhancement of cauda equina nerve roots in the lower lumbar and sacral canal. Paraspinal and other soft tissues: Chronic asymmetric left psoas and left greater than right posterior paraspinal muscle atrophy. Partially visualized extensive gluteal musculature enhancement bilaterally. Small right renal cyst. Disc levels: L1-2: Disc bulging and mild facet and ligamentum flavum hypertrophy result in moderate right lateral recess stenosis and moderate right and mild left neural foraminal stenosis, similar to the prior MRI. Borderline to mild spinal stenosis. L2-3: Disc bulging and moderate facet and ligamentum flavum hypertrophy result in mild right lateral recess stenosis and severe right neural foraminal stenosis, similar to the prior MRI. Borderline spinal stenosis. L3-4: Right eccentric disc bulging and mild-to-moderate facet and ligamentum flavum hypertrophy result in borderline to mild right lateral recess stenosis and mild right neural foraminal stenosis without spinal stenosis, similar to the prior MRI. L4-5: Minimal disc bulging without stenosis. L5-S1: Negative. IMPRESSION: 1. Known widespread metastatic disease throughout the thoracic and lumbar spine. 2. 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 3. Abnormal enhancement relatively diffusely along the surface of the thoracic spinal cord with suspected mild involvement of the cauda equina nerve roots compatible with left meningeal disease. 4. Edema in the distal spinal cord/conus as well as edema and/or syrinx in the upper thoracic cord  extending into the cervical region. 5. Widespread edema/enhancement involving the thoracic paraspinal musculature and bilateral gluteal musculature, potentially denervation or rhabdomyolysis. 6. Small left pleural effusion. Electronically Signed   By: Logan Bores M.D.   On: 02/07/2021 16:11   MR Lumbar Spine W Wo Contrast  Result Date: 02/07/2021 CLINICAL DATA:  Back pain. Difficulty with urination. History of breast cancer with widespread osseous metastatic disease. EXAM: MRI THORACIC AND LUMBAR SPINE WITHOUT AND WITH CONTRAST TECHNIQUE: Multiplanar and multiecho pulse sequences of the thoracic and lumbar spine were obtained without and with intravenous contrast. CONTRAST:  7mL GADAVIST GADOBUTROL 1 MMOL/ML IV SOLN COMPARISON:  Lumbar spine radiographs 02/05/2021. Nuclear medicine whole-body bone scan 01/03/2021. Chest CT 01/03/2021. Lumbar spine MRI 10/04/2019. FINDINGS: MRI THORACIC SPINE FINDINGS The study is moderately motion degraded, greatest on axial sequences. Alignment: Mild lower thoracic dextroscoliosis. No significant listhesis. Vertebrae: Known widespread metastatic disease throughout the vertebral bodies and posterior elements at every thoracic level as well as in the included cervical spine. Pathologic T4 compression fracture with mild vertebral body height loss, similar to the prior chest CT.  A T9 superior endplate Schmorl's node is unchanged. There is evidence of epidural tumor in the lower thoracic spine, greatest at T10-11 where there is resultant moderate spinal stenosis. The craniocaudal extent of epidural tumor is difficult to precisely determine although it may extend cranially to approximately T7-8. Cord: Abnormal enhancement along the surface of the spinal cord at least throughout the mid and upper thoracic spine with extension towards the cervical region. Abnormal T2 hyperintensity centrally in the upper and, to a lesser extent, mid thoracic spinal cord which could reflect edema or a  syrinx extending towards the cervical region and measuring up to approximately 4 mm in diameter near the cervicothoracic junction. Additional edema in the lower thoracic spinal cord. Paraspinal and other soft tissues: Small left pleural effusion, larger than on the prior chest CT. Widespread STIR hyperintensity and enhancement of the left greater than right posterior paraspinal musculature. Disc levels: Disc degeneration greatest at T10-11 and T11-12 where there is asymmetrically severe left-sided disc space narrowing. Up to moderate lower thoracic spinal stenosis due to epidural tumor. MRI LUMBAR SPINE FINDINGS Segmentation:  Standard. Alignment: Moderate lumbar levoscoliosis. No significant listhesis. Vertebrae: Chronic L4 Schmorl's nodes and mild chronic L3 compression fracture. Known widespread osseous metastatic disease throughout the lumbar spine and included pelvis. Epidural tumor at the thoracolumbar junction as noted above on the thoracic spine MRI. Mildly prominent epidural enhancement throughout the lumbar spine more caudally, indeterminate for epidural tumor but without a dominant epidural mass in the lumbar spine. Epidural tumor is suspected in the sacrum involving left-sided neural foramina. Prominent perineural cysts at multiple levels as previously seen. Conus medullaris: Extends to the T12-L1 level and demonstrates edema as noted above. There is abnormal nodular enhancement along the surface of the distal spinal cord/conus, and there also appears to be mild enhancement of cauda equina nerve roots in the lower lumbar and sacral canal. Paraspinal and other soft tissues: Chronic asymmetric left psoas and left greater than right posterior paraspinal muscle atrophy. Partially visualized extensive gluteal musculature enhancement bilaterally. Small right renal cyst. Disc levels: L1-2: Disc bulging and mild facet and ligamentum flavum hypertrophy result in moderate right lateral recess stenosis and moderate  right and mild left neural foraminal stenosis, similar to the prior MRI. Borderline to mild spinal stenosis. L2-3: Disc bulging and moderate facet and ligamentum flavum hypertrophy result in mild right lateral recess stenosis and severe right neural foraminal stenosis, similar to the prior MRI. Borderline spinal stenosis. L3-4: Right eccentric disc bulging and mild-to-moderate facet and ligamentum flavum hypertrophy result in borderline to mild right lateral recess stenosis and mild right neural foraminal stenosis without spinal stenosis, similar to the prior MRI. L4-5: Minimal disc bulging without stenosis. L5-S1: Negative. IMPRESSION: 1. Known widespread metastatic disease throughout the thoracic and lumbar spine. 2. 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 3. Abnormal enhancement relatively diffusely along the surface of the thoracic spinal cord with suspected mild involvement of the cauda equina nerve roots compatible with left meningeal disease. 4. Edema in the distal spinal cord/conus as well as edema and/or syrinx in the upper thoracic cord extending into the cervical region. 5. Widespread edema/enhancement involving the thoracic paraspinal musculature and bilateral gluteal musculature, potentially denervation or rhabdomyolysis. 6. Small left pleural effusion. Electronically Signed   By: Logan Bores M.D.   On: 02/07/2021 16:11   DG Chest Portable 1 View  Result Date: 02/07/2021 CLINICAL DATA:  Shortness of breath, difficulty urinating EXAM: PORTABLE CHEST  1 VIEW COMPARISON:  10/11/2020 FINDINGS: Mild cardiomegaly. Mild, diffuse bilateral interstitial pulmonary opacity. Small right pleural effusion. Unchanged elevation of the left hemidiaphragm with associated atelectasis or consolidation. The visualized skeletal structures are unremarkable. IMPRESSION: 1. Mild, diffuse bilateral interstitial pulmonary opacity, most consistent with edema. No focal  airspace opacity. 2. Small right pleural effusion. 3. Unchanged elevation of the left hemidiaphragm with associated atelectasis or consolidation. Electronically Signed   By: Delanna Ahmadi M.D.   On: 02/07/2021 16:44     Assessment/Plan: Estimated body mass index is 20.8 kg/m as calculated from the following:   Height as of 01/04/21: 5' 0.75" (1.543 m).   Weight as of 01/04/21: 49.5 kg.   Unfortunate 76 year old [name] with saddle anesthesia starting a couple of weeks ago and urinary retention starting a couple of days ago who has very diffuse severe metastatic disease to the thoracic and lumbar spine basically involving every single level.  There is epidural disease in the lower thoracic spine and there is up to moderate stenosis at T10 without obvious deformity to the cord.  There is signal change within the cord from the cervical level all the way down to the conus with there is edema within the conus.  There is enhancement of the surface of the thoracic cord going up into the cervical region.  This is suggestive of leptomeningeal spread of tumor.  This would carry a very poor prognosis.  I suspect, given the good strength in her legs, and the bladder dysfunction, that her bladder dysfunction is more related to the edema within the conus than it is from compression at T10 which is moderate in nature but not severe.  She probably does have spinal stenosis in the lumbar region giving her some claudication-like symptoms.  Have spoken with the patient and the hospitalist at length.  I suspect the prognosis here is quite poor.  I do not believe that surgery is going to play a vital role here, unless we find that she does not have leptomeningeal spread.  I suspect she will need palliative radiation and chemotherapy and hospice care.  I recommend a high-volume lumbar puncture for cytology to rule out leptomeningeal disease.  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  Recommend potential urology evaluation for urinary retention  Recommend medical and radiation oncology consults.  I have spoken with her previous spine surgeon, Dr. Lynann Bologna, since she is not an unattached or unassigned patient.  He will look at her scans tomorrow to offer an opinion also.  Eustace Moore 02/07/2021 6:11 PM

## 2021-02-07 NOTE — ED Notes (Signed)
Pt in MRI.

## 2021-02-07 NOTE — ED Triage Notes (Signed)
Pt states she is having difficulty urinating. Pt states she urinated this morning, but it was not much. Pt states before this morning she had not urinated since Monday night. Pt states she has been drinking her normal amount of water. Pt states she does not currently have the urge to urinate.

## 2021-02-07 NOTE — ED Notes (Signed)
Pt returned from MRI °

## 2021-02-07 NOTE — ED Provider Notes (Addendum)
[facility] DEPT Provider Note   CSN: [id number] Arrival date & time: 02/07/21  1053     History  Chief Complaint  Patient presents with   Urinary Retention    Leslie [name] is a 76 y.o. female.  Patient presents ER chief complaint of difficulty urinating.  Patient has a history of metastatic breast cancer currently not receiving any treatment.  She was seen in the ER few days ago for several weeks of intermittent back pain.  She denies any back pain at this time but states it comes and goes.  Denies any weakness of her extremities but she is noticed numbness in her groin area for the past 3 weeks as well.  No reports of fevers or vomiting or diarrhea otherwise.  She has had a constant cough unchanged for the last several weeks.      Home Medications Prior to Admission medications   Medication Sig Start Date End Date Taking? Authorizing Provider  acetaminophen (TYLENOL) 500 MG tablet Take 500-1,000 mg by mouth every 6 (six) hours as needed for moderate pain or headache.    [provider]  anastrozole (ARIMIDEX) 1 MG tablet Take 1 tablet (1 mg total) by mouth daily. 12/12/20   Magrinat, Virgie Dad, MD  atenolol-chlorthalidone (TENORETIC) 50-25 MG per tablet Take 0.5 tablets by mouth daily.     [provider]  cholecalciferol (VITAMIN D) 400 units TABS tablet Take 400 Units by mouth daily.    [provider]  Evening Primrose Oil 1000 MG CAPS Take 1,000 mg by mouth daily.    [provider]  fluocinonide cream (LIDEX) 9.62 % Apply 1 application topically 2 (two) times daily as needed (for break outs).    [provider]  folic acid (FOLVITE) 952 MCG tablet Take 400 mcg by mouth daily.     [provider]  gabapentin (NEURONTIN) 300 MG capsule Take 1 capsule (300 mg total) by mouth at bedtime. Patient not taking: Reported on 10/25/2020 12/30/19   Magrinat, Virgie Dad, MD  lidocaine (LIDODERM) 5 % Place 1  patch onto the skin daily. Remove & Discard patch within 12 hours or as directed by MD 11/09/20   Magrinat, Virgie Dad, MD  mirtazapine (REMERON) 15 MG tablet Take 0.5 tablets (7.5 mg total) by mouth at bedtime. 01/04/21   Magrinat, Virgie Dad, MD  Multiple Vitamin (MULTIVITAMIN PO) Take 1 tablet by mouth daily.     [provider]  ondansetron (ZOFRAN) 8 MG tablet Take by mouth every 8 (eight) hours as needed for nausea or vomiting. 11/09/20   Magrinat, Virgie Dad, MD  oxyCODONE-acetaminophen (PERCOCET/ROXICET) 5-325 MG tablet Take 1 tablet by mouth every 6 (six) hours as needed for severe pain. 02/05/21   Hayden Rasmussen, MD  palbociclib Leslee Home) 125 MG tablet Take 1 tablet (125 mg total) by mouth daily. Take for 21 days on, 7 days off, repeat every 28 days. 07/18/20   Magrinat, Virgie Dad, MD  rosuvastatin (CRESTOR) 10 MG tablet Take 1 tablet (10 mg total) by mouth daily. 10/25/20   Lorretta Harp, MD  triamcinolone cream (KENALOG) 0.1 % Apply topically. 05/07/19   [provider]  venlafaxine XR (EFFEXOR-XR) 37.5 MG 24 hr capsule Take 1 capsule (37.5 mg total) by mouth daily with breakfast. 03/14/20   Magrinat, Virgie Dad, MD  vitamin E 400 UNIT capsule Take 400 Units by mouth daily.    [provider]      Allergies  Aspirin and Cetirizine    Review of Systems   Review of Systems  Constitutional:  Negative for fever.  HENT:  Negative for ear pain.   Eyes:  Negative for pain.  Respiratory:  Negative for cough.   Cardiovascular:  Negative for chest pain.  Gastrointestinal:  Negative for abdominal pain.  Genitourinary:  Negative for flank pain.  Musculoskeletal:  Positive for back pain.  Skin:  Negative for rash.  Neurological:  Negative for headaches.   Physical Exam Updated Vital Signs BP (!) 143/71    Pulse 96    Temp 98 F (36.7 C) (Oral)    Resp 18    SpO2 100%  Physical Exam Constitutional:      General: She is not in acute distress.    Appearance:  Normal appearance.  HENT:     Head: Normocephalic.     Nose: Nose normal.  Eyes:     Extraocular Movements: Extraocular movements intact.  Cardiovascular:     Rate and Rhythm: Normal rate.  Pulmonary:     Effort: Pulmonary effort is normal.  Genitourinary:    Comments: Positive saddle anesthesia present.  Rectal tone appears present but diminished tone. Musculoskeletal:        General: Normal range of motion.     Cervical back: Normal range of motion.  Neurological:     General: No focal deficit present.     Mental Status: She is alert. Mental status is at baseline.    ED Results / Procedures / Treatments   Labs (all labs ordered are listed, but only abnormal results are displayed) Labs Reviewed  CBC WITH DIFFERENTIAL/PLATELET - Abnormal; Notable for the following components:      Result Value   RBC 2.21 (*)    Hemoglobin 7.6 (*)    HCT 24.8 (*)    MCV 112.2 (*)    MCH 34.4 (*)    RDW 19.2 (*)    Platelets 401 (*)    nRBC 5.1 (*)    Monocytes Absolute 1.4 (*)    Abs Immature Granulocytes 0.10 (*)    All other components within normal limits  COMPREHENSIVE METABOLIC PANEL - Abnormal; Notable for the following components:   Potassium 3.4 (*)    Glucose, Bld 113 (*)    Albumin 3.2 (*)    AST 63 (*)    ALT 51 (*)    Alkaline Phosphatase 153 (*)    All other components within normal limits  RESP PANEL BY RT-PCR (FLU A&B, COVID) ARPGX2  CREATININE, SERUM  I-STAT CREATININE, ED    EKG None  Radiology MR THORACIC SPINE W WO CONTRAST  Result Date: 02/07/2021 CLINICAL DATA:  Back pain. Difficulty with urination. History of breast cancer with widespread osseous metastatic disease. EXAM: MRI THORACIC AND LUMBAR SPINE WITHOUT AND WITH CONTRAST TECHNIQUE: Multiplanar and multiecho pulse sequences of the thoracic and lumbar spine were obtained without and with intravenous contrast. CONTRAST:  77mL GADAVIST GADOBUTROL 1 MMOL/ML IV SOLN COMPARISON:  Lumbar spine radiographs  02/05/2021. Nuclear medicine whole-body bone scan 01/03/2021. Chest CT 01/03/2021. Lumbar spine MRI 10/04/2019. FINDINGS: MRI THORACIC SPINE FINDINGS The study is moderately motion degraded, greatest on axial sequences. Alignment: Mild lower thoracic dextroscoliosis. No significant listhesis. Vertebrae: Known widespread metastatic disease throughout the vertebral bodies and posterior elements at every thoracic level as well as in the included cervical spine. Pathologic T4 compression fracture with mild vertebral body height loss, similar to the prior chest CT. A T9 superior endplate Schmorl's node  is unchanged. There is evidence of epidural tumor in the lower thoracic spine, greatest at T10-11 where there is resultant moderate spinal stenosis. The craniocaudal extent of epidural tumor is difficult to precisely determine although it may extend cranially to approximately T7-8. Cord: Abnormal enhancement along the surface of the spinal cord at least throughout the mid and upper thoracic spine with extension towards the cervical region. Abnormal T2 hyperintensity centrally in the upper and, to a lesser extent, mid thoracic spinal cord which could reflect edema or a syrinx extending towards the cervical region and measuring up to approximately 4 mm in diameter near the cervicothoracic junction. Additional edema in the lower thoracic spinal cord. Paraspinal and other soft tissues: Small left pleural effusion, larger than on the prior chest CT. Widespread STIR hyperintensity and enhancement of the left greater than right posterior paraspinal musculature. Disc levels: Disc degeneration greatest at T10-11 and T11-12 where there is asymmetrically severe left-sided disc space narrowing. Up to moderate lower thoracic spinal stenosis due to epidural tumor. MRI LUMBAR SPINE FINDINGS Segmentation:  Standard. Alignment: Moderate lumbar levoscoliosis. No significant listhesis. Vertebrae: Chronic L4 Schmorl's nodes and mild chronic  L3 compression fracture. Known widespread osseous metastatic disease throughout the lumbar spine and included pelvis. Epidural tumor at the thoracolumbar junction as noted above on the thoracic spine MRI. Mildly prominent epidural enhancement throughout the lumbar spine more caudally, indeterminate for epidural tumor but without a dominant epidural mass in the lumbar spine. Epidural tumor is suspected in the sacrum involving left-sided neural foramina. Prominent perineural cysts at multiple levels as previously seen. Conus medullaris: Extends to the T12-L1 level and demonstrates edema as noted above. There is abnormal nodular enhancement along the surface of the distal spinal cord/conus, and there also appears to be mild enhancement of cauda equina nerve roots in the lower lumbar and sacral canal. Paraspinal and other soft tissues: Chronic asymmetric left psoas and left greater than right posterior paraspinal muscle atrophy. Partially visualized extensive gluteal musculature enhancement bilaterally. Small right renal cyst. Disc levels: L1-2: Disc bulging and mild facet and ligamentum flavum hypertrophy result in moderate right lateral recess stenosis and moderate right and mild left neural foraminal stenosis, similar to the prior MRI. Borderline to mild spinal stenosis. L2-3: Disc bulging and moderate facet and ligamentum flavum hypertrophy result in mild right lateral recess stenosis and severe right neural foraminal stenosis, similar to the prior MRI. Borderline spinal stenosis. L3-4: Right eccentric disc bulging and mild-to-moderate facet and ligamentum flavum hypertrophy result in borderline to mild right lateral recess stenosis and mild right neural foraminal stenosis without spinal stenosis, similar to the prior MRI. L4-5: Minimal disc bulging without stenosis. L5-S1: Negative. IMPRESSION: 1. Known widespread metastatic disease throughout the thoracic and lumbar spine. 2. Epidural tumor greatest in the lower  thoracic spine with moderate spinal stenosis at T10-11. 3. Abnormal enhancement relatively diffusely along the surface of the thoracic spinal cord with suspected mild involvement of the cauda equina nerve roots compatible with left meningeal disease. 4. Edema in the distal spinal cord/conus as well as edema and/or syrinx in the upper thoracic cord extending into the cervical region. 5. Widespread edema/enhancement involving the thoracic paraspinal musculature and bilateral gluteal musculature, potentially denervation or rhabdomyolysis. 6. Small left pleural effusion. Electronically Signed   By: Logan Bores M.D.   On: 02/07/2021 16:11   MR Lumbar Spine W Wo Contrast  Result Date: 02/07/2021 CLINICAL DATA:  Back pain. Difficulty with urination. History of breast cancer with widespread osseous metastatic  disease. EXAM: MRI THORACIC AND LUMBAR SPINE WITHOUT AND WITH CONTRAST TECHNIQUE: Multiplanar and multiecho pulse sequences of the thoracic and lumbar spine were obtained without and with intravenous contrast. CONTRAST:  6mL GADAVIST GADOBUTROL 1 MMOL/ML IV SOLN COMPARISON:  Lumbar spine radiographs 02/05/2021. Nuclear medicine whole-body bone scan 01/03/2021. Chest CT 01/03/2021. Lumbar spine MRI 10/04/2019. FINDINGS: MRI THORACIC SPINE FINDINGS The study is moderately motion degraded, greatest on axial sequences. Alignment: Mild lower thoracic dextroscoliosis. No significant listhesis. Vertebrae: Known widespread metastatic disease throughout the vertebral bodies and posterior elements at every thoracic level as well as in the included cervical spine. Pathologic T4 compression fracture with mild vertebral body height loss, similar to the prior chest CT. A T9 superior endplate Schmorl's node is unchanged. There is evidence of epidural tumor in the lower thoracic spine, greatest at T10-11 where there is resultant moderate spinal stenosis. The craniocaudal extent of epidural tumor is difficult to precisely  determine although it may extend cranially to approximately T7-8. Cord: Abnormal enhancement along the surface of the spinal cord at least throughout the mid and upper thoracic spine with extension towards the cervical region. Abnormal T2 hyperintensity centrally in the upper and, to a lesser extent, mid thoracic spinal cord which could reflect edema or a syrinx extending towards the cervical region and measuring up to approximately 4 mm in diameter near the cervicothoracic junction. Additional edema in the lower thoracic spinal cord. Paraspinal and other soft tissues: Small left pleural effusion, larger than on the prior chest CT. Widespread STIR hyperintensity and enhancement of the left greater than right posterior paraspinal musculature. Disc levels: Disc degeneration greatest at T10-11 and T11-12 where there is asymmetrically severe left-sided disc space narrowing. Up to moderate lower thoracic spinal stenosis due to epidural tumor. MRI LUMBAR SPINE FINDINGS Segmentation:  Standard. Alignment: Moderate lumbar levoscoliosis. No significant listhesis. Vertebrae: Chronic L4 Schmorl's nodes and mild chronic L3 compression fracture. Known widespread osseous metastatic disease throughout the lumbar spine and included pelvis. Epidural tumor at the thoracolumbar junction as noted above on the thoracic spine MRI. Mildly prominent epidural enhancement throughout the lumbar spine more caudally, indeterminate for epidural tumor but without a dominant epidural mass in the lumbar spine. Epidural tumor is suspected in the sacrum involving left-sided neural foramina. Prominent perineural cysts at multiple levels as previously seen. Conus medullaris: Extends to the T12-L1 level and demonstrates edema as noted above. There is abnormal nodular enhancement along the surface of the distal spinal cord/conus, and there also appears to be mild enhancement of cauda equina nerve roots in the lower lumbar and sacral canal. Paraspinal and  other soft tissues: Chronic asymmetric left psoas and left greater than right posterior paraspinal muscle atrophy. Partially visualized extensive gluteal musculature enhancement bilaterally. Small right renal cyst. Disc levels: L1-2: Disc bulging and mild facet and ligamentum flavum hypertrophy result in moderate right lateral recess stenosis and moderate right and mild left neural foraminal stenosis, similar to the prior MRI. Borderline to mild spinal stenosis. L2-3: Disc bulging and moderate facet and ligamentum flavum hypertrophy result in mild right lateral recess stenosis and severe right neural foraminal stenosis, similar to the prior MRI. Borderline spinal stenosis. L3-4: Right eccentric disc bulging and mild-to-moderate facet and ligamentum flavum hypertrophy result in borderline to mild right lateral recess stenosis and mild right neural foraminal stenosis without spinal stenosis, similar to the prior MRI. L4-5: Minimal disc bulging without stenosis. L5-S1: Negative. IMPRESSION: 1. Known widespread metastatic disease throughout the thoracic and lumbar spine. 2. Epidural  tumor greatest in the lower thoracic spine with moderate spinal stenosis at T10-11. 3. Abnormal enhancement relatively diffusely along the surface of the thoracic spinal cord with suspected mild involvement of the cauda equina nerve roots compatible with left meningeal disease. 4. Edema in the distal spinal cord/conus as well as edema and/or syrinx in the upper thoracic cord extending into the cervical region. 5. Widespread edema/enhancement involving the thoracic paraspinal musculature and bilateral gluteal musculature, potentially denervation or rhabdomyolysis. 6. Small left pleural effusion. Electronically Signed   By: Logan Bores M.D.   On: 02/07/2021 16:11   DG Chest Portable 1 View  Result Date: 02/07/2021 CLINICAL DATA:  Shortness of breath, difficulty urinating EXAM: PORTABLE CHEST 1 VIEW COMPARISON:  10/11/2020 FINDINGS: Mild  cardiomegaly. Mild, diffuse bilateral interstitial pulmonary opacity. Small right pleural effusion. Unchanged elevation of the left hemidiaphragm with associated atelectasis or consolidation. The visualized skeletal structures are unremarkable. IMPRESSION: 1. Mild, diffuse bilateral interstitial pulmonary opacity, most consistent with edema. No focal airspace opacity. 2. Small right pleural effusion. 3. Unchanged elevation of the left hemidiaphragm with associated atelectasis or consolidation. Electronically Signed   By: Delanna Ahmadi M.D.   On: 02/07/2021 16:44    Procedures .Critical Care Performed by: Luna Fuse, MD Authorized by: Luna Fuse, MD   Critical care provider statement:    Critical care time (minutes):  40   Critical care time was exclusive of:  Separately billable procedures and treating other patients and teaching time   Critical care was necessary to treat or prevent imminent or life-threatening deterioration of the following conditions:  CNS failure or compromise    Medications Ordered in ED Medications  gadobutrol (GADAVIST) 1 MMOL/ML injection 5 mL (5 mLs Intravenous Contrast Given 02/07/21 1506)  oxyCODONE-acetaminophen (PERCOCET/ROXICET) 5-325 MG per tablet 1 tablet (1 tablet Oral Given 02/07/21 1534)    ED Course/ Medical Decision Making/ A&P                           Medical Decision Making Amount and/or Complexity of Data Reviewed Labs: ordered. Radiology: ordered.  Risk Prescription drug management.   Bedside bladder scan showed approximately 700 cc of urine retention.  Foley catheter placed with several 100 cc of urine output noted.  Given her history MRI of the L-spine T-spine pursued showing multiple epidural tumors epidural tumor and findings concerning for mild cauda equina.  Consulted neurosurgery requested.  Request medical admission.           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

## 2021-02-08 ENCOUNTER — Encounter (HOSPITAL_COMMUNITY): Payer: Self-pay | Admitting: Internal Medicine

## 2021-02-08 ENCOUNTER — Inpatient Hospital Stay (HOSPITAL_COMMUNITY): Payer: Medicare Other

## 2021-02-08 ENCOUNTER — Ambulatory Visit: Payer: Medicare Other | Admitting: Radiation Oncology

## 2021-02-08 ENCOUNTER — Ambulatory Visit
Admit: 2021-02-08 | Discharge: 2021-02-08 | Disposition: A | Discharge status: Home / Self Care | Payer: Medicare Other | Attending: Radiation Oncology | Admitting: Radiation Oncology

## 2021-02-08 DIAGNOSIS — C7951 Secondary malignant neoplasm of bone: Secondary | ICD-10-CM | POA: Diagnosis not present

## 2021-02-08 DIAGNOSIS — G834 Cauda equina syndrome: Secondary | ICD-10-CM

## 2021-02-08 DIAGNOSIS — G959 Disease of spinal cord, unspecified: Secondary | ICD-10-CM

## 2021-02-08 DIAGNOSIS — Z170 Estrogen receptor positive status [ER+]: Secondary | ICD-10-CM

## 2021-02-08 DIAGNOSIS — C7949 Secondary malignant neoplasm of other parts of nervous system: Secondary | ICD-10-CM

## 2021-02-08 DIAGNOSIS — Z9862 Peripheral vascular angioplasty status: Secondary | ICD-10-CM

## 2021-02-08 DIAGNOSIS — R338 Other retention of urine: Secondary | ICD-10-CM | POA: Diagnosis not present

## 2021-02-08 DIAGNOSIS — C799 Secondary malignant neoplasm of unspecified site: Secondary | ICD-10-CM

## 2021-02-08 DIAGNOSIS — E43 Unspecified severe protein-calorie malnutrition: Secondary | ICD-10-CM | POA: Diagnosis present

## 2021-02-08 DIAGNOSIS — C50412 Malignant neoplasm of upper-outer quadrant of left female breast: Secondary | ICD-10-CM

## 2021-02-08 LAB — COMPREHENSIVE METABOLIC PANEL
ALT: 48 U/L — ABNORMAL HIGH (ref 0–44)
AST: 56 U/L — ABNORMAL HIGH (ref 15–41)
Albumin: 2.8 g/dL — ABNORMAL LOW (ref 3.5–5.0)
Alkaline Phosphatase: 159 U/L — ABNORMAL HIGH (ref 38–126)
Anion gap: 10 (ref 5–15)
BUN: 12 mg/dL (ref 8–23)
CO2: 24 mmol/L (ref 22–32)
Calcium: 9 mg/dL (ref 8.9–10.3)
Chloride: 102 mmol/L (ref 98–111)
Creatinine, Ser: 0.58 mg/dL (ref 0.44–1.00)
GFR, Estimated: >60 mL/min (ref >60)
Glucose, Bld: 152 mg/dL — ABNORMAL HIGH (ref 70–99)
Potassium: 4.4 mmol/L (ref 3.5–5.1)
Sodium: 136 mmol/L (ref 135–145)
Total Bilirubin: 0.5 mg/dL (ref 0.3–1.2)
Total Protein: 7.1 g/dL (ref 6.5–8.1)

## 2021-02-08 LAB — GLUCOSE, CAPILLARY: Glucose-Capillary: 146 mg/dL — ABNORMAL HIGH (ref 70–99)

## 2021-02-08 LAB — PROTEIN, CSF: Total  Protein, CSF: >600 mg/dL — ABNORMAL HIGH (ref 15–45)

## 2021-02-08 LAB — IRON AND TIBC
Iron: 40 ug/dL (ref 28–170)
Saturation Ratios: 14 % (ref 10.4–31.8)
TIBC: 280 ug/dL (ref 250–450)
UIBC: 240 ug/dL

## 2021-02-08 LAB — CBC WITH DIFFERENTIAL/PLATELET
Abs Immature Granulocytes: 0.14 10*3/uL — ABNORMAL HIGH (ref 0.00–0.07)
Basophils Absolute: 0 10*3/uL (ref 0.0–0.1)
Basophils Relative: 0 %
Eosinophils Absolute: 0 10*3/uL (ref 0.0–0.5)
Eosinophils Relative: 0 %
HCT: 24.2 % — ABNORMAL LOW (ref 36.0–46.0)
Hemoglobin: 7.4 g/dL — ABNORMAL LOW (ref 12.0–15.0)
Immature Granulocytes: 2 %
Lymphocytes Relative: 24 %
Lymphs Abs: 1.9 10*3/uL (ref 0.7–4.0)
MCH: 35.2 pg — ABNORMAL HIGH (ref 26.0–34.0)
MCHC: 30.6 g/dL (ref 30.0–36.0)
MCV: 115.2 fL — ABNORMAL HIGH (ref 80.0–100.0)
Monocytes Absolute: 0.8 10*3/uL (ref 0.1–1.0)
Monocytes Relative: 10 %
Neutro Abs: 5.2 10*3/uL (ref 1.7–7.7)
Neutrophils Relative %: 64 %
Platelets: 317 10*3/uL (ref 150–400)
RBC: 2.1 MIL/uL — ABNORMAL LOW (ref 3.87–5.11)
RDW: 19.2 % — ABNORMAL HIGH (ref 11.5–15.5)
WBC: 8.1 10*3/uL (ref 4.0–10.5)
nRBC: 2.1 % — ABNORMAL HIGH (ref 0.0–0.2)

## 2021-02-08 LAB — GLUCOSE, CSF: Glucose, CSF: 64 mg/dL (ref 40–70)

## 2021-02-08 LAB — FOLATE: Folate: 18.7 ng/mL (ref >5.9)

## 2021-02-08 LAB — CSF CELL COUNT WITH DIFFERENTIAL
Eosinophils, CSF: 0 % (ref 0–1)
Lymphs, CSF: 73 % (ref 40–80)
Monocyte-Macrophage-Spinal Fluid: 24 % (ref 15–45)
Other Cells, CSF: UNDETERMINED
RBC Count, CSF: 110 /mm3 — ABNORMAL HIGH
Segmented Neutrophils-CSF: 3 % (ref 0–6)
Tube #: 1
WBC, CSF: 31 /mm3 (ref 0–5)

## 2021-02-08 LAB — MAGNESIUM: Magnesium: 2.2 mg/dL (ref 1.7–2.4)

## 2021-02-08 LAB — VITAMIN B12: Vitamin B-12: 1288 pg/mL — ABNORMAL HIGH (ref 180–914)

## 2021-02-08 LAB — FERRITIN: Ferritin: 1662 ng/mL — ABNORMAL HIGH (ref 11–307)

## 2021-02-08 MED ORDER — IOHEXOL 300 MG/ML  SOLN
80.0000 mL | Freq: Once | INTRAMUSCULAR | Status: AC | PRN
  Administered 2021-02-08: 80 mL via INTRAVENOUS

## 2021-02-08 MED ORDER — ENSURE ENLIVE PO LIQD
237.0000 mL | Freq: Two times a day (BID) | ORAL | Status: DC
  Administered 2021-02-08 – 2021-02-19 (×21): 237 mL via ORAL

## 2021-02-08 MED ORDER — IOHEXOL 9 MG/ML PO SOLN
500.0000 mL | ORAL | Status: AC
  Administered 2021-02-08 (×2): 500 mL via ORAL

## 2021-02-08 MED ORDER — LIDOCAINE HCL 1 % IJ SOLN
INTRAMUSCULAR | Status: AC
  Filled 2021-02-08: qty 20

## 2021-02-08 MED ORDER — ADULT MULTIVITAMIN W/MINERALS CH
1.0000 | ORAL_TABLET | Freq: Every day | ORAL | Status: DC
  Administered 2021-02-08 – 2021-02-19 (×12): 1 via ORAL
  Filled 2021-02-08 (×12): qty 1

## 2021-02-08 MED ORDER — ENSURE ENLIVE PO LIQD: 237.0000 mL | Freq: Two times a day (BID) | ORAL | Status: DC

## 2021-02-08 MED ORDER — SODIUM CHLORIDE (PF) 0.9 % IJ SOLN
INTRAMUSCULAR | Status: AC
  Filled 2021-02-08: qty 50

## 2021-02-08 NOTE — TOC Initial Note (Signed)
Transition of Care [facility]) - Initial/Assessment Note   Patient Details  Name: Leslie [name] MRN: [id number] Date of Birth: 09/04/1945  Transition of Care [facility]) CM/SW Contact:    Sherie Don, LCSW Phone Number: 02/08/2021, 3:27 PM  Clinical Narrative: PT and OT evaluations recommended HH and a rolling walker. CSW spoke with patient and patient is agreeable to [facility] and DME referrals. CSW made [facility] referral to [facility] with [facility]. CSW updated patient. CSW to set up rolling walker when order is placed.  Expected Discharge Plan: [place] Barriers to Discharge: Continued Medical Work up  Patient Goals and CMS Choice Patient states their goals for this hospitalization and ongoing recovery are:: Return home with [facility] CMS Medicare.gov Compare Post Acute Care list provided to:: Patient Choice offered to / list presented to : Patient  Expected Discharge Plan and Services Expected Discharge Plan: [place] In-house Referral: Clinical Social Work Post Acute Care Choice: Museum/gallery conservator, Home Health Living arrangements for the past 2 months: [place]: PT, OT [place] Agency: [facility] ([facility]) Date [place]: 02/08/21 Representative spoke with at [place]: Corene Cornea  Prior Living Arrangements/Services Living arrangements for the past 2 months: [place] Patient language and need for interpreter reviewed:: Yes Do you feel safe going back to the place where you live?: Yes      Need for Family Participation in Patient Care: No (Comment) Care giver support system in place?: Yes (comment) Criminal Activity/Legal Involvement Pertinent to Current Situation/Hospitalization: No - Comment as needed  Permission Sought/Granted Permission sought to share information with : Other (comment) Permission granted to share information with : Yes, Verbal Permission Granted Permission granted to share info w AGENCY:  [place] agencies, DME companies  Emotional Assessment Attitude/Demeanor/Rapport: Engaged Affect (typically observed): Accepting Orientation: : Oriented to Self, Oriented to Place, Oriented to  Time, Oriented to Situation Alcohol / Substance Use: Tobacco Use  Admission diagnosis:  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] Spinal cord lesion ([place]) [G95.9] PAD (peripheral artery disease) ([place]) [I73.9] 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] Leptomeningeal disease [G96.198] Metastatic malignant neoplasm, unspecified site [facility]) [C79.9] Status post percutaneous transluminal angioplasty (PTA) [Z98.62] Patient Active Problem List   Diagnosis Date Noted   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   Acute urinary retention 02/07/2021   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[place])    Spinal cord lesion ([place])    Malignant neoplasm of upper-outer quadrant of left breast in female, estrogen receptor positive ([place]) 11/05/2019   Bone metastases ([place]) 10/29/2019   Hyperlipidemia 07/08/2017   Claudication in peripheral vascular disease ([place]) 06/19/2017   Essential hypertension 06/04/2017   Peripheral arterial disease ([place]) 06/04/2017   Tobacco abuse 06/04/2017   Hot flashes 11/26/2010   PCP:  Mayra Neer, MD Pharmacy:   [id number] - [place], [place] - [address] [place] [address] Phone: [phone] Fax: [phone]  [facility] - [place], [place] [address] Phone: [phone] Fax: [phone]  Readmission Risk Interventions No flowsheet data found.

## 2021-02-08 NOTE — Consult Note (Addendum)
Leslie [name]  [phone] Fax:([phone]) [phone]   MEDICAL ONCOLOGY - INITIAL CONSULTATION   Referral MD: Dr. 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  She was receiving Ibrance but this was recently discontinued in December per the patient.  She presented to the emergency department with urinary retention for the past 2 to 3 days.  She has been experiencing a 3 to 4-week history of numbness to the groin as well as some intermittent bowel incontinence and bladder incontinence.  She reports a poor appetite and has lost about 15 pounds recently.  In the emergency department, labs were notable for hemoglobin of 7.6, MCV 112.2, platelets 401,000, albumin 3.2, AST 63, ALT 51, alk phos 153.  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  Her oncologic history is as follows: 1.  Status post left breast upper outer quadrant biopsy and left axillary needle core biopsy on 11/23/2008 for clinical T3 N1, stage IIA invasive lobular carcinoma, E-cadherin negative, estrogen and progesterone receptor positive, HER-2 not amplified, with an MIB-1 of 17% Ki-67 17%   2.    On neoadjuvant letrozole started in 12/2008, interrupted at the time of her definitive surgery, then resumed post-op   4.  Status post left breast  lumpectomy with regional lymph node resection 07/12/2009 for a stage IIA, pT1c pN1, invasive lobular carcinoma,with angiolymphatic invasion identified             (a) 2 of 8 removed left nodes were involved              (b) repeat prognostic panel again estrogen receptor positive, not progesterone receptor negative, with an MIB-1 of 74% and HER-2 not amplified              (c) additional surgery for margin clearance 08/04/2009 showed only atypical lobular hyperplasia   5. Oncotype score of 20 predicts a 5-year rate of recurrence outside the breast with antiestrogens as the only systemic therapy of 13%.  It also predicts no significant benefit from chemotherapy.    6.  status post radiation therapy 09/26/2009-11/11/18   7.  letrozole continued through December 2015 (5 years)   METASTATIC DISEASE: September 2021 8.  Lumbar MRI 10/04/2019 suggests metastatic disease to bone             (a) CT of the chest and bone scan 11/05/2019 confirm multiple lytic and sclerotic bone lesions, but no evidence of visceral disease             (b) bone marrow biopsy 11/19/2019 confirms metastatic carcinoma, estrogen receptor positive and gross cystic disease fluid protein positive, progesterone receptor negative             (c) CA 27-29 and CEA are not informative (CA 27-29 is 41.3 on 11/03/2019)   9.  zoledronate started 11/10/2019, repeat every 12 weeks   10.  anastrozole started 11/05/2019             (a) palbociclib 125 mg daily, 21/7,  started 12/02/2019   11. Restaging studies:             (A) chest CT with contrast 06/04/2020 shows no visceral disease, unchanged bony lesions             (B) bone scan 06/04/2020 shows no change compared to 03/09/2020             (C) bone scan 01/03/2021 shows progression of disease             (D) chest CT 01/03/2021 shows progression of disease  This morning, the patient reports ongoing numbness to her groin area.  Reports mild back pain.  Has had some mild dizziness.   Reports a poor appetite and has lost about 15 pounds recently.  She denies fevers, chills, headaches, chest pain, shortness of breath, abdominal pain, nausea, vomiting.  No bleeding reported.  The patient is widowed and lives by herself.  She has a niece that lives locally.  Prior to the past few weeks, she has been independent with all of her ADLs and IADLs and has been able to drive herself and prepare her own meals.  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   Past Medical History:  Diagnosis Date   Benign breast cyst in female    Right breast   Cancer of left breast ([place]) 2010   Depression    History of blood transfusion    "related to tubal pregnancy"   Hypertension    PAD (peripheral artery disease) ([place])   :   Past Surgical History:  Procedure Laterality Date   ABDOMINAL HYSTERECTOMY  1980s   BREAST CYST EXCISION Right ~ 2005   BREAST LUMPECTOMY Left 11/23/2008   ECTOPIC PREGNANCY SURGERY  1966   LOWER EXTREMITY ANGIOGRAM  06/19/2017   LOWER EXTREMITY ANGIOGRAPHY N/A 06/19/2017   Procedure: LOWER EXTREMITY ANGIOGRAPHY;  Surgeon: Lorretta Harp, MD;  Location: [facility];  Service: Cardiovascular;  Laterality: N/A;   PERIPHERAL VASCULAR BALLOON ANGIOPLASTY Left 06/19/2017   SFA    PERIPHERAL VASCULAR BALLOON ANGIOPLASTY  06/19/2017   Procedure: PERIPHERAL VASCULAR BALLOON ANGIOPLASTY;  Surgeon: Lorretta Harp, MD;  Location: [facility];  Service: Cardiovascular;;  SFA left  :   Current Facility-Administered Medications  Medication Dose Route Frequency Provider Last Rate Last Admin   0.9 %  sodium chloride infusion   Intravenous Continuous Eugenie Filler, MD 75 mL/hr at 02/07/21 2256 New Bag at 02/07/21 2256   acetaminophen (TYLENOL) tablet 650 mg  650 mg Oral Q6H PRN Eugenie Filler, MD       Or   acetaminophen (TYLENOL) suppository 650 mg  650 mg  Rectal Q6H PRN Eugenie Filler, MD       anastrozole (ARIMIDEX) tablet 1 mg  1 mg Oral Daily Eugenie Filler, MD   1 mg at 02/08/21 1062   Chlorhexidine Gluconate Cloth 2 % PADS 6 each  6 each Topical Daily Eugenie Filler, MD       cholecalciferol (VITAMIN D3) tablet 400 Units  400 Units Oral Daily Eugenie Filler, MD   400 Units at 02/08/21 0828   dexamethasone (DECADRON) injection 4 mg  4 mg Intravenous Q6H Eugenie Filler, MD   4 mg at 69/48/54 6270   folic acid (FOLVITE) tablet 0.5 mg  0.5 mg Oral Daily Eugenie Filler, MD   0.5 mg at 02/08/21 [phone]  ipratropium-albuterol (DUONEB) 0.5-2.5 (3) MG/3ML nebulizer solution 3 mL  3 mL Nebulization Q2H PRN Eugenie Filler, MD       lidocaine (LIDODERM) 5 % 1 patch  1 patch Transdermal Q24H Eugenie Filler, MD   1 patch at 02/08/21 0826   mirtazapine (REMERON) tablet 7.5 mg  7.5 mg Oral QHS Eugenie Filler, MD   7.5 mg at 02/07/21 2250   ondansetron (ZOFRAN) tablet 4 mg  4 mg Oral Q6H PRN Eugenie Filler, MD       Or   ondansetron [facility]) injection 4 mg  4 mg Intravenous Q6H PRN Eugenie Filler, MD       oxyCODONE (Oxy IR/ROXICODONE) immediate release tablet 5 mg  5 mg Oral Q4H PRN Eugenie Filler, MD   5 mg at 02/08/21 0150   oxyCODONE-acetaminophen (PERCOCET/ROXICET) 5-325 MG per tablet 1 tablet  1 tablet Oral Q6H PRN Eugenie Filler, MD       polyethylene glycol (MIRALAX / GLYCOLAX) packet 17 g  17 g Oral Daily Eugenie Filler, MD   17 g at 02/08/21 5366   senna (SENOKOT) tablet 8.6 mg  1 tablet Oral BID Eugenie Filler, MD   8.6 mg at 02/08/21 0826   sorbitol 70 % solution 30 mL  30 mL Oral Daily PRN Eugenie Filler, MD       vitamin E capsule 400 Units  400 Units Oral Daily Eugenie Filler, MD   400 Units at 02/08/21 0827      Allergies  Allergen Reactions   Aspirin Nausea And Vomiting   Cetirizine Other (See Comments)    Chest Pain    Rosuvastatin Other (See Comments)    MD suspended  this due to bone pain  :   Family History  Problem Relation Age of Onset   Hypertension Mother    Seizures Mother    Heart disease Father    Hypertension Sister   :   Social History   Socioeconomic History   Marital status: Widowed    Spouse name: Not on file   Number of children: Not on file   Years of education: Not on file   Highest education level: Not on file  Occupational History   Not on file  Tobacco Use   Smoking status: Every Day    Packs/day: 0.50    Years: 45.00    Pack years: 22.50    Types: Cigarettes   Smokeless tobacco: Never  Vaping Use   Vaping Use: Never used  Substance and Sexual Activity   Alcohol use: Yes    Comment: 06/19/2017 "glass of wine 3-4 times/year"   Drug use: Never   Sexual activity: Not Currently    Birth control/protection: Post-menopausal, Surgical  Other Topics Concern   Not on file  Social History Narrative   Not on file   Social Determinants of Health   Financial Resource Strain: Not on file  Food Insecurity: Not on file  Transportation Needs: Not on file  Physical Activity: Not on file  Stress: Not on file  Social Connections: Not on file  Intimate Partner Violence: Not on file  :  Review of Systems: A comprehensive 14 point review of systems was negative except as noted in the HPI.  Exam: Patient Vitals for the past 24 hrs:  BP Temp Temp src Pulse Resp SpO2 Weight  02/08/21 0500 -- -- -- -- -- -- 45.5 kg  02/08/21 0408 -- 98.2 F (36.8 C)  Oral -- -- -- --  02/08/21 0200 140/65 -- -- 90 (!) 21 100 % --  02/08/21 0100 (!) 128/53 -- -- 87 20 100 % --  02/08/21 0000 137/63 -- -- 91 (!) 22 99 % --  02/07/21 2300 (!) 149/65 -- -- 96 (!) 21 100 % --  02/07/21 1730 138/77 -- -- 100 17 99 % --  02/07/21 1700 (!) 143/71 -- -- 96 18 100 % --  02/07/21 1630 139/88 -- -- 97 18 100 % --  02/07/21 1615 136/66 -- -- 99 16 100 % --  02/07/21 1533 -- -- -- 96 -- 96 % --  02/07/21 1530 127/65 -- -- 98 18 90 % --  02/07/21  1527 -- -- -- 100 -- (!) 85 % --  02/07/21 1524 132/85 -- -- (!) 105 -- (!) 87 % --  02/07/21 1359 -- -- -- -- -- 100 % --  02/07/21 1131 110/86 98 F (36.7 C) Oral 88 18 97 % --    General: Awake and alert, no distress. Eyes:  no scleral icterus.   ENT:  There were no oropharyngeal lesions.   Lymphatics:  Negative cervical, supraclavicular or axillary adenopathy.   Respiratory: lungs were clear bilaterally without wheezing or crackles.   Cardiovascular:  Regular rate and rhythm, S1/S2, without murmur, rub or gallop.  There was no pedal edema.   GI:  abdomen was soft, flat, nontender, nondistended, without organomegaly.   Musculoskeletal: Strength 4/5 in the bilateral lower extremities. Skin exam was without echymosis, petichae.   Neuro exam was nonfocal. Patient was alert and oriented.  Attention was good.   Language was appropriate.  Mood was normal without depression.  Speech was not pressured.  Thought content was not tangential.  Saddle anesthesia present.   Lab Results  Component Value Date   WBC 8.1 02/08/2021   HGB 7.4 (L) 02/08/2021   HCT 24.2 (L) 02/08/2021   PLT 317 02/08/2021   GLUCOSE 152 (H) 02/08/2021   CHOL 195 10/06/2018   TRIG 124 10/06/2018   HDL 57 10/06/2018   LDLCALC 116 (H) 10/06/2018   ALT 48 (H) 02/08/2021   AST 56 (H) 02/08/2021   NA 136 02/08/2021   K 4.4 02/08/2021   CL 102 02/08/2021   CREATININE 0.58 02/08/2021   BUN 12 02/08/2021   CO2 24 02/08/2021    DG Lumbar Spine Complete  Result Date: 02/05/2021 CLINICAL DATA:  Hip pain EXAM: LUMBAR SPINE - COMPLETE 4+ VIEW COMPARISON:  08/10/2019 FINDINGS: Levoscoliosis. Vertebral body heights are grossly maintained. 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 Multilevel degenerative change, advanced at L2-L3. Facet degenerative changes at multiple levels. IMPRESSION: 1. Scoliosis with degenerative changes most advanced at L2-L3. 2. Diffuse sclerosis consistent with skeletal metastatic  disease Electronically Signed   By: Donavan Foil M.D.   On: 02/05/2021 15:33   MR THORACIC SPINE W WO CONTRAST  Result Date: 02/07/2021 CLINICAL DATA:  Back pain. Difficulty with urination. History of breast cancer with widespread osseous metastatic disease. EXAM: MRI THORACIC AND LUMBAR SPINE WITHOUT AND WITH CONTRAST TECHNIQUE: Multiplanar and multiecho pulse sequences of the thoracic and lumbar spine were obtained without and with intravenous contrast. CONTRAST:  75mL GADAVIST GADOBUTROL 1 MMOL/ML IV SOLN COMPARISON:  Lumbar spine radiographs 02/05/2021. Nuclear medicine whole-body bone scan 01/03/2021. Chest CT 01/03/2021. Lumbar spine MRI 10/04/2019. FINDINGS: MRI THORACIC SPINE FINDINGS The study is moderately motion degraded, greatest on axial sequences. Alignment: Mild lower thoracic dextroscoliosis. No significant listhesis. Vertebrae:  Known widespread metastatic disease throughout the vertebral bodies and posterior elements at every thoracic level as well as in the included cervical spine. Pathologic T4 compression fracture with mild vertebral body height loss, similar to the prior chest CT. A T9 superior endplate Schmorl's node is unchanged. There is evidence of epidural tumor in the lower thoracic spine, greatest at T10-11 where there is resultant moderate spinal stenosis. The craniocaudal extent of epidural tumor is difficult to precisely determine although it may extend cranially to approximately T7-8. Cord: Abnormal enhancement along the surface of the spinal cord at least throughout the mid and upper thoracic spine with extension towards the cervical region. Abnormal T2 hyperintensity centrally in the upper and, to a lesser extent, mid thoracic spinal cord which could reflect edema or a syrinx extending towards the cervical region and measuring up to approximately 4 mm in diameter near the cervicothoracic junction. Additional edema in the lower thoracic spinal cord. Paraspinal and other soft  tissues: Small left pleural effusion, larger than on the prior chest CT. Widespread STIR hyperintensity and enhancement of the left greater than right posterior paraspinal musculature. Disc levels: Disc degeneration greatest at T10-11 and T11-12 where there is asymmetrically severe left-sided disc space narrowing. Up to moderate lower thoracic spinal stenosis due to epidural tumor. MRI LUMBAR SPINE FINDINGS Segmentation:  Standard. Alignment: Moderate lumbar levoscoliosis. No significant listhesis. Vertebrae: Chronic L4 Schmorl's nodes and mild chronic L3 compression fracture. Known widespread osseous metastatic disease throughout the lumbar spine and included pelvis. Epidural tumor at the thoracolumbar junction as noted above on the thoracic spine MRI. Mildly prominent epidural enhancement throughout the lumbar spine more caudally, indeterminate for epidural tumor but without a dominant epidural mass in the lumbar spine. Epidural tumor is suspected in the sacrum involving left-sided neural foramina. Prominent perineural cysts at multiple levels as previously seen. Conus medullaris: Extends to the T12-L1 level and demonstrates edema as noted above. There is abnormal nodular enhancement along the surface of the distal spinal cord/conus, and there also appears to be mild enhancement of cauda equina nerve roots in the lower lumbar and sacral canal. Paraspinal and other soft tissues: Chronic asymmetric left psoas and left greater than right posterior paraspinal muscle atrophy. Partially visualized extensive gluteal musculature enhancement bilaterally. Small right renal cyst. Disc levels: L1-2: Disc bulging and mild facet and ligamentum flavum hypertrophy result in moderate right lateral recess stenosis and moderate right and mild left neural foraminal stenosis, similar to the prior MRI. Borderline to mild spinal stenosis. L2-3: Disc bulging and moderate facet and ligamentum flavum hypertrophy result in mild right  lateral recess stenosis and severe right neural foraminal stenosis, similar to the prior MRI. Borderline spinal stenosis. L3-4: Right eccentric disc bulging and mild-to-moderate facet and ligamentum flavum hypertrophy result in borderline to mild right lateral recess stenosis and mild right neural foraminal stenosis without spinal stenosis, similar to the prior MRI. L4-5: Minimal disc bulging without stenosis. L5-S1: Negative. IMPRESSION: 1. Known widespread metastatic disease throughout the thoracic and lumbar spine. 2. 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 3. Abnormal enhancement relatively diffusely along the surface of the thoracic spinal cord with suspected mild involvement of the cauda equina nerve roots compatible with left meningeal disease. 4. Edema in the distal spinal cord/conus as well as edema and/or syrinx in the upper thoracic cord extending into the cervical region. 5. Widespread edema/enhancement involving the thoracic paraspinal musculature and bilateral gluteal musculature, potentially denervation or rhabdomyolysis. 6. Small  left pleural effusion. Electronically Signed   By: Logan Bores M.D.   On: 02/07/2021 16:11   MR Lumbar Spine W Wo Contrast  Result Date: 02/07/2021 CLINICAL DATA:  Back pain. Difficulty with urination. History of breast cancer with widespread osseous metastatic disease. EXAM: MRI THORACIC AND LUMBAR SPINE WITHOUT AND WITH CONTRAST TECHNIQUE: Multiplanar and multiecho pulse sequences of the thoracic and lumbar spine were obtained without and with intravenous contrast. CONTRAST:  44mL GADAVIST GADOBUTROL 1 MMOL/ML IV SOLN COMPARISON:  Lumbar spine radiographs 02/05/2021. Nuclear medicine whole-body bone scan 01/03/2021. Chest CT 01/03/2021. Lumbar spine MRI 10/04/2019. FINDINGS: MRI THORACIC SPINE FINDINGS The study is moderately motion degraded, greatest on axial sequences. Alignment: Mild lower thoracic  dextroscoliosis. No significant listhesis. Vertebrae: Known widespread metastatic disease throughout the vertebral bodies and posterior elements at every thoracic level as well as in the included cervical spine. Pathologic T4 compression fracture with mild vertebral body height loss, similar to the prior chest CT. A T9 superior endplate Schmorl's node is unchanged. There is evidence of epidural tumor in the lower thoracic spine, greatest at T10-11 where there is resultant moderate spinal stenosis. The craniocaudal extent of epidural tumor is difficult to precisely determine although it may extend cranially to approximately T7-8. Cord: Abnormal enhancement along the surface of the spinal cord at least throughout the mid and upper thoracic spine with extension towards the cervical region. Abnormal T2 hyperintensity centrally in the upper and, to a lesser extent, mid thoracic spinal cord which could reflect edema or a syrinx extending towards the cervical region and measuring up to approximately 4 mm in diameter near the cervicothoracic junction. Additional edema in the lower thoracic spinal cord. Paraspinal and other soft tissues: Small left pleural effusion, larger than on the prior chest CT. Widespread STIR hyperintensity and enhancement of the left greater than right posterior paraspinal musculature. Disc levels: Disc degeneration greatest at T10-11 and T11-12 where there is asymmetrically severe left-sided disc space narrowing. Up to moderate lower thoracic spinal stenosis due to epidural tumor. MRI LUMBAR SPINE FINDINGS Segmentation:  Standard. Alignment: Moderate lumbar levoscoliosis. No significant listhesis. Vertebrae: Chronic L4 Schmorl's nodes and mild chronic L3 compression fracture. Known widespread osseous metastatic disease throughout the lumbar spine and included pelvis. Epidural tumor at the thoracolumbar junction as noted above on the thoracic spine MRI. Mildly prominent epidural enhancement  throughout the lumbar spine more caudally, indeterminate for epidural tumor but without a dominant epidural mass in the lumbar spine. Epidural tumor is suspected in the sacrum involving left-sided neural foramina. Prominent perineural cysts at multiple levels as previously seen. Conus medullaris: Extends to the T12-L1 level and demonstrates edema as noted above. There is abnormal nodular enhancement along the surface of the distal spinal cord/conus, and there also appears to be mild enhancement of cauda equina nerve roots in the lower lumbar and sacral canal. Paraspinal and other soft tissues: Chronic asymmetric left psoas and left greater than right posterior paraspinal muscle atrophy. Partially visualized extensive gluteal musculature enhancement bilaterally. Small right renal cyst. Disc levels: L1-2: Disc bulging and mild facet and ligamentum flavum hypertrophy result in moderate right lateral recess stenosis and moderate right and mild left neural foraminal stenosis, similar to the prior MRI. Borderline to mild spinal stenosis. L2-3: Disc bulging and moderate facet and ligamentum flavum hypertrophy result in mild right lateral recess stenosis and severe right neural foraminal stenosis, similar to the prior MRI. Borderline spinal stenosis. L3-4: Right eccentric disc bulging and mild-to-moderate facet and ligamentum flavum hypertrophy  result in borderline to mild right lateral recess stenosis and mild right neural foraminal stenosis without spinal stenosis, similar to the prior MRI. L4-5: Minimal disc bulging without stenosis. L5-S1: Negative. IMPRESSION: 1. Known widespread metastatic disease throughout the thoracic and lumbar spine. 2. 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 3. Abnormal enhancement relatively diffusely along the surface of the thoracic spinal cord with suspected mild involvement of the cauda equina nerve roots compatible with left meningeal  disease. 4. Edema in the distal spinal cord/conus as well as edema and/or syrinx in the upper thoracic cord extending into the cervical region. 5. Widespread edema/enhancement involving the thoracic paraspinal musculature and bilateral gluteal musculature, potentially denervation or rhabdomyolysis. 6. Small left pleural effusion. Electronically Signed   By: Logan Bores M.D.   On: 02/07/2021 16:11   DG Chest Portable 1 View  Result Date: 02/07/2021 CLINICAL DATA:  Shortness of breath, difficulty urinating EXAM: PORTABLE CHEST 1 VIEW COMPARISON:  10/11/2020 FINDINGS: Mild cardiomegaly. Mild, diffuse bilateral interstitial pulmonary opacity. Small right pleural effusion. Unchanged elevation of the left hemidiaphragm with associated atelectasis or consolidation. The visualized skeletal structures are unremarkable. IMPRESSION: 1. Mild, diffuse bilateral interstitial pulmonary opacity, most consistent with edema. No focal airspace opacity. 2. Small right pleural effusion. 3. Unchanged elevation of the left hemidiaphragm with associated atelectasis or consolidation. Electronically Signed   By: Delanna Ahmadi M.D.   On: 02/07/2021 16:44   VAS [place] ABI WITH/WO TBI  Result Date: 02/01/2021  LOWER EXTREMITY DOPPLER STUDY Patient Name:  YARNELL KOZLOSKI  Date of Exam:   02/01/2021 Medical Rec #: [id number]        Accession #:    [id number] Date of Birth: [birth year]         Patient Gender: F Patient Age:   23 years Exam Location:  [facility] Procedure:      VAS [place] ABI WITH/WO TBI Referring Phys: Roderic Palau BERRY --------------------------------------------------------------------------------  Indications: Peripheral artery disease, and left SFA intervention. Patient does              not report any claudication symptoms or rest pain. She does report              bilateral leg pain but cancer has come back and now its in her              bones and she believes the pain is coming from that as well as the              medication she  is currently taking for it. High Risk Factors: Hypertension, hyperlipidemia, current smoker.  Vascular Interventions: Successful chocolate balloon angioplasty followed by                         drug-eluting balloon angioplasty of a high-grade mid                         left SFA stenosis with an excellent angiographic result                         on 06/19/2017. Comparison Study: In 01/2020, a lower arterial Doppler showed an ABI of 1.05,                   bilaterally. Performing Technologist: Sharlett Iles RVT  Examination Guidelines: A complete evaluation includes at minimum, Doppler waveform signals and  systolic blood pressure reading at the level of bilateral brachial, anterior tibial, and posterior tibial arteries, when vessel segments are accessible. Bilateral testing is considered an integral part of a complete examination. Photoelectric Plethysmograph (PPG) waveforms and toe systolic pressure readings are included as required and additional duplex testing as needed. Limited examinations for reoccurring indications may be performed as noted.  ABI Findings: +---------+------------------+-----+---------+--------+  Right     Rt Pressure (mmHg) Index Waveform  Comment   +---------+------------------+-----+---------+--------+  Brachial  129                                          +---------+------------------+-----+---------+--------+  PTA       143                1.09  triphasic           +---------+------------------+-----+---------+--------+  PERO      152                      triphasic 1.16      +---------+------------------+-----+---------+--------+  DP        146                1.11  triphasic           +---------+------------------+-----+---------+--------+  Great Toe 133                1.02  Normal              +---------+------------------+-----+---------+--------+ +---------+------------------+-----+---------+---------+  Left      Lt Pressure (mmHg) Index Waveform  Comment     +---------+------------------+-----+---------+---------+  Brachial  131                                           +---------+------------------+-----+---------+---------+  PTA                                absent    inaudible  +---------+------------------+-----+---------+---------+  PERO      141                      triphasic 1.08       +---------+------------------+-----+---------+---------+  DP        130                0.99  triphasic            +---------+------------------+-----+---------+---------+  Great Toe 99                 0.76  Normal               +---------+------------------+-----+---------+---------+ +-------+-----------+-----------+------------+------------+  ABI/TBI Today's ABI Today's TBI Previous ABI Previous TBI  +-------+-----------+-----------+------------+------------+  Right   1.16        1.02        1.05         .81           +-------+-----------+-----------+------------+------------+  Left    1.08        .76         1.05         .62           +-------+-----------+-----------+------------+------------+  Bilateral ABIs  and right TBIs appear essentially unchanged compared to prior study on 02/02/2020. Left TBIs appear increased compared to prior study on 02/02/2020.  Summary: Right: Resting right ankle-brachial index is within normal range. No evidence of significant right lower extremity arterial disease. The right toe-brachial index is normal. Left: Resting left ankle-brachial index is within normal range. No evidence of significant left lower extremity arterial disease. The left toe-brachial index is normal.  *See table(s) above for measurements and observations. See LE Arterial duplex report. Suggest follow up study in 12 months. Electronically signed by Quay Burow MD on 02/01/2021 at 6:17:26 PM.    Final    DG Hip Unilat W or Wo Pelvis 2-3 Views Left  Result Date: 02/05/2021 CLINICAL DATA:  Hip pain EXAM: DG HIP (WITH OR WITHOUT PELVIS) 2-3V LEFT COMPARISON:  Bone scan  01/03/2021 FINDINGS: SI joints are non widened. Pubic symphysis and rami appear intact. 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 No fracture or malalignment. IMPRESSION: 1. No acute osseous abnormality. 2. 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   On: 02/05/2021 15:23   DG Hip Unilat W or Wo Pelvis 2-3 Views Right  Result Date: 02/05/2021 CLINICAL DATA:  Hip pain EXAM: DG HIP (WITH OR WITHOUT PELVIS) 2-3V RIGHT COMPARISON:  Bone scan 01/03/2021 FINDINGS: SI joints are non widened. Pubic symphysis is intact. No fracture or malalignment. Heterogeneous sclerosis within the femurs and pelvic bones consistent with history of skeletal osseous metastatic disease IMPRESSION: 1. No acute osseous abnormality 2. Heterogeneous sclerosis within the pelvic bones and femurs consistent with history of skeletal metastatic disease Electronically Signed   By: Donavan Foil M.D.   On: 02/05/2021 15:22   VAS [place] LOWER EXTREMITY ARTERIAL DUPLEX  Result Date: 02/01/2021 LOWER EXTREMITY ARTERIAL DUPLEX STUDY Patient Name:  SHEILA OCASIO  Date of Exam:   02/01/2021 Medical Rec #: [id number]        Accession #:    [id number] Date of Birth: [birth year]         Patient Gender: F Patient Age:   45 years Exam Location:  [facility] Procedure:      VAS [place] LOWER EXTREMITY ARTERIAL DUPLEX Referring Phys: Roderic Palau BERRY --------------------------------------------------------------------------------  Indications: Peripheral artery disease, and left SFA intervention. Patient does              not report any claudication symptoms or rest pain. She does report              bilateral leg pain but cancer has come back and now its in her              bones and she believes the pain is coming from that as well as the              medication she is currently taking for it. High Risk Factors: Hypertension, hyperlipidemia, current  smoker.  Vascular Interventions: Successful chocolate balloon angioplasty followed by                         drug-eluting balloon angioplasty of a high-grade mid                         left SFA stenosis with an excellent angiographic result  on 06/19/2017. Current ABI:            1.16 on the right and 1.08 on the left. Comparison Study: In 01/2020, a lower arterial duplex showed 30-49% stenosis in                   the left proximal CFA. Patent left mid SFA without evidence of                   restenosis, s/p angioplasty. Performing Technologist: Sharlett Iles RVT  Examination Guidelines: A complete evaluation includes B-mode imaging, spectral Doppler, color Doppler, and power Doppler as needed of all accessible portions of each vessel. Bilateral testing is considered an integral part of a complete examination. Limited examinations for reoccurring indications may be performed as noted.   +----------+--------+-----+---------------+-----------+-------------+  LEFT       PSV cm/s Ratio Stenosis        Waveform    Comments       +----------+--------+-----+---------------+-----------+-------------+  CFA Prox   214            50-74% stenosis triphasic   low end range  +----------+--------+-----+---------------+-----------+-------------+  CFA Mid    191                            triphasic                  +----------+--------+-----+---------------+-----------+-------------+  CFA Distal 148                            triphasic                  +----------+--------+-----+---------------+-----------+-------------+  DFA        283                            triphasic   turbulent      +----------+--------+-----+---------------+-----------+-------------+  SFA Prox   155                            biphasic                   +----------+--------+-----+---------------+-----------+-------------+  SFA Mid    118                            triphasic                   +----------+--------+-----+---------------+-----------+-------------+  SFA Distal 166                            multiphasic                +----------+--------+-----+---------------+-----------+-------------+  POP Prox   144                            triphasic                  +----------+--------+-----+---------------+-----------+-------------+  POP Mid    145                            triphasic                  +----------+--------+-----+---------------+-----------+-------------+  POP Distal 153                            biphasic                   +----------+--------+-----+---------------+-----------+-------------+  TP Trunk   108                            triphasic                  +----------+--------+-----+---------------+-----------+-------------+  Summary: Left: No significant change as compared to previous study. Heterogeneous plaque throughout. 50-74% stenosis in the proximal CFA, low end range. Patent mid SFA without evidence of restenosis, s/p angioplasty.  See table(s) above for measurements and observations. See ABI report. Suggest follow up study in 12 months. Electronically signed by Quay Burow MD on 02/01/2021 at 6:17:09 PM.    Final      DG Lumbar Spine Complete  Result Date: 02/05/2021 CLINICAL DATA:  Hip pain EXAM: LUMBAR SPINE - COMPLETE 4+ VIEW COMPARISON:  08/10/2019 FINDINGS: Levoscoliosis. Vertebral body heights are grossly maintained. 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 Multilevel degenerative change, advanced at L2-L3. Facet degenerative changes at multiple levels. IMPRESSION: 1. Scoliosis with degenerative changes most advanced at L2-L3. 2. 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   On: 02/05/2021 15:33   MR THORACIC SPINE W WO CONTRAST  Result Date: 02/07/2021 CLINICAL DATA:  Back pain. Difficulty with urination. History of breast cancer with widespread osseous metastatic  disease. EXAM: MRI THORACIC AND LUMBAR SPINE WITHOUT AND WITH CONTRAST TECHNIQUE: Multiplanar and multiecho pulse sequences of the thoracic and lumbar spine were obtained without and with intravenous contrast. CONTRAST:  66mL GADAVIST GADOBUTROL 1 MMOL/ML IV SOLN COMPARISON:  Lumbar spine radiographs 02/05/2021. Nuclear medicine whole-body bone scan 01/03/2021. Chest CT 01/03/2021. Lumbar spine MRI 10/04/2019. FINDINGS: MRI THORACIC SPINE FINDINGS The study is moderately motion degraded, greatest on axial sequences. Alignment: Mild lower thoracic dextroscoliosis. No significant listhesis. Vertebrae: Known widespread metastatic disease throughout the vertebral bodies and posterior elements at every thoracic level as well as in the included cervical spine. Pathologic T4 compression fracture with mild vertebral body height loss, similar to the prior chest CT. A T9 superior endplate Schmorl's node is unchanged. There is evidence of epidural tumor in the lower thoracic spine, greatest at T10-11 where there is resultant moderate spinal stenosis. The craniocaudal extent of epidural tumor is difficult to precisely determine although it may extend cranially to approximately T7-8. Cord: Abnormal enhancement along the surface of the spinal cord at least throughout the mid and upper thoracic spine with extension towards the cervical region. Abnormal T2 hyperintensity centrally in the upper and, to a lesser extent, mid thoracic spinal cord which could reflect edema or a syrinx extending towards the cervical region and measuring up to approximately 4 mm in diameter near the cervicothoracic junction. Additional edema in the lower thoracic spinal cord. Paraspinal and other soft tissues: Small left pleural effusion, larger than on the prior chest CT. Widespread STIR hyperintensity and enhancement of the left greater than right posterior paraspinal musculature. Disc levels: Disc degeneration greatest at T10-11 and T11-12 where there  is asymmetrically severe left-sided disc space narrowing. Up to moderate lower thoracic spinal stenosis due to epidural tumor. MRI LUMBAR SPINE FINDINGS Segmentation:  Standard. Alignment: Moderate lumbar levoscoliosis. No significant listhesis.  Vertebrae: Chronic L4 Schmorl's nodes and mild chronic L3 compression fracture. Known widespread osseous metastatic disease throughout the lumbar spine and included pelvis. Epidural tumor at the thoracolumbar junction as noted above on the thoracic spine MRI. Mildly prominent epidural enhancement throughout the lumbar spine more caudally, indeterminate for epidural tumor but without a dominant epidural mass in the lumbar spine. Epidural tumor is suspected in the sacrum involving left-sided neural foramina. Prominent perineural cysts at multiple levels as previously seen. Conus medullaris: Extends to the T12-L1 level and demonstrates edema as noted above. There is abnormal nodular enhancement along the surface of the distal spinal cord/conus, and there also appears to be mild enhancement of cauda equina nerve roots in the lower lumbar and sacral canal. Paraspinal and other soft tissues: Chronic asymmetric left psoas and left greater than right posterior paraspinal muscle atrophy. Partially visualized extensive gluteal musculature enhancement bilaterally. Small right renal cyst. Disc levels: L1-2: Disc bulging and mild facet and ligamentum flavum hypertrophy result in moderate right lateral recess stenosis and moderate right and mild left neural foraminal stenosis, similar to the prior MRI. Borderline to mild spinal stenosis. L2-3: Disc bulging and moderate facet and ligamentum flavum hypertrophy result in mild right lateral recess stenosis and severe right neural foraminal stenosis, similar to the prior MRI. Borderline spinal stenosis. L3-4: Right eccentric disc bulging and mild-to-moderate facet and ligamentum flavum hypertrophy result in borderline to mild right lateral  recess stenosis and mild right neural foraminal stenosis without spinal stenosis, similar to the prior MRI. L4-5: Minimal disc bulging without stenosis. L5-S1: Negative. IMPRESSION: 1. Known widespread metastatic disease throughout the thoracic and lumbar spine. 2. 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 3. Abnormal enhancement relatively diffusely along the surface of the thoracic spinal cord with suspected mild involvement of the cauda equina nerve roots compatible with left meningeal disease. 4. Edema in the distal spinal cord/conus as well as edema and/or syrinx in the upper thoracic cord extending into the cervical region. 5. Widespread edema/enhancement involving the thoracic paraspinal musculature and bilateral gluteal musculature, potentially denervation or rhabdomyolysis. 6. Small left pleural effusion. Electronically Signed   By: Logan Bores M.D.   On: 02/07/2021 16:11   MR Lumbar Spine W Wo Contrast  Result Date: 02/07/2021 CLINICAL DATA:  Back pain. Difficulty with urination. History of breast cancer with widespread osseous metastatic disease. EXAM: MRI THORACIC AND LUMBAR SPINE WITHOUT AND WITH CONTRAST TECHNIQUE: Multiplanar and multiecho pulse sequences of the thoracic and lumbar spine were obtained without and with intravenous contrast. CONTRAST:  76mL GADAVIST GADOBUTROL 1 MMOL/ML IV SOLN COMPARISON:  Lumbar spine radiographs 02/05/2021. Nuclear medicine whole-body bone scan 01/03/2021. Chest CT 01/03/2021. Lumbar spine MRI 10/04/2019. FINDINGS: MRI THORACIC SPINE FINDINGS The study is moderately motion degraded, greatest on axial sequences. Alignment: Mild lower thoracic dextroscoliosis. No significant listhesis. Vertebrae: Known widespread metastatic disease throughout the vertebral bodies and posterior elements at every thoracic level as well as in the included cervical spine. Pathologic T4 compression fracture with mild vertebral body  height loss, similar to the prior chest CT. A T9 superior endplate Schmorl's node is unchanged. There is evidence of epidural tumor in the lower thoracic spine, greatest at T10-11 where there is resultant moderate spinal stenosis. The craniocaudal extent of epidural tumor is difficult to precisely determine although it may extend cranially to approximately T7-8. Cord: Abnormal enhancement along the surface of the spinal cord at least throughout the mid and upper thoracic spine with  extension towards the cervical region. Abnormal T2 hyperintensity centrally in the upper and, to a lesser extent, mid thoracic spinal cord which could reflect edema or a syrinx extending towards the cervical region and measuring up to approximately 4 mm in diameter near the cervicothoracic junction. Additional edema in the lower thoracic spinal cord. Paraspinal and other soft tissues: Small left pleural effusion, larger than on the prior chest CT. Widespread STIR hyperintensity and enhancement of the left greater than right posterior paraspinal musculature. Disc levels: Disc degeneration greatest at T10-11 and T11-12 where there is asymmetrically severe left-sided disc space narrowing. Up to moderate lower thoracic spinal stenosis due to epidural tumor. MRI LUMBAR SPINE FINDINGS Segmentation:  Standard. Alignment: Moderate lumbar levoscoliosis. No significant listhesis. Vertebrae: Chronic L4 Schmorl's nodes and mild chronic L3 compression fracture. Known widespread osseous metastatic disease throughout the lumbar spine and included pelvis. Epidural tumor at the thoracolumbar junction as noted above on the thoracic spine MRI. Mildly prominent epidural enhancement throughout the lumbar spine more caudally, indeterminate for epidural tumor but without a dominant epidural mass in the lumbar spine. Epidural tumor is suspected in the sacrum involving left-sided neural foramina. Prominent perineural cysts at multiple levels as previously seen.  Conus medullaris: Extends to the T12-L1 level and demonstrates edema as noted above. There is abnormal nodular enhancement along the surface of the distal spinal cord/conus, and there also appears to be mild enhancement of cauda equina nerve roots in the lower lumbar and sacral canal. Paraspinal and other soft tissues: Chronic asymmetric left psoas and left greater than right posterior paraspinal muscle atrophy. Partially visualized extensive gluteal musculature enhancement bilaterally. Small right renal cyst. Disc levels: L1-2: Disc bulging and mild facet and ligamentum flavum hypertrophy result in moderate right lateral recess stenosis and moderate right and mild left neural foraminal stenosis, similar to the prior MRI. Borderline to mild spinal stenosis. L2-3: Disc bulging and moderate facet and ligamentum flavum hypertrophy result in mild right lateral recess stenosis and severe right neural foraminal stenosis, similar to the prior MRI. Borderline spinal stenosis. L3-4: Right eccentric disc bulging and mild-to-moderate facet and ligamentum flavum hypertrophy result in borderline to mild right lateral recess stenosis and mild right neural foraminal stenosis without spinal stenosis, similar to the prior MRI. L4-5: Minimal disc bulging without stenosis. L5-S1: Negative. IMPRESSION: 1. Known widespread metastatic disease throughout the thoracic and lumbar spine. 2. 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 3. Abnormal enhancement relatively diffusely along the surface of the thoracic spinal cord with suspected mild involvement of the cauda equina nerve roots compatible with left meningeal disease. 4. Edema in the distal spinal cord/conus as well as edema and/or syrinx in the upper thoracic cord extending into the cervical region. 5. Widespread edema/enhancement involving the thoracic paraspinal musculature and bilateral gluteal musculature, potentially denervation  or rhabdomyolysis. 6. Small left pleural effusion. Electronically Signed   By: Logan Bores M.D.   On: 02/07/2021 16:11   DG Chest Portable 1 View  Result Date: 02/07/2021 CLINICAL DATA:  Shortness of breath, difficulty urinating EXAM: PORTABLE CHEST 1 VIEW COMPARISON:  10/11/2020 FINDINGS: Mild cardiomegaly. Mild, diffuse bilateral interstitial pulmonary opacity. Small right pleural effusion. Unchanged elevation of the left hemidiaphragm with associated atelectasis or consolidation. The visualized skeletal structures are unremarkable. IMPRESSION: 1. Mild, diffuse bilateral interstitial pulmonary opacity, most consistent with edema. No focal airspace opacity. 2. Small right pleural effusion. 3. Unchanged elevation of the left hemidiaphragm with associated atelectasis or consolidation.  Electronically Signed   By: Delanna Ahmadi M.D.   On: 02/07/2021 16:44   VAS [place] ABI WITH/WO TBI  Result Date: 02/01/2021  LOWER EXTREMITY DOPPLER STUDY Patient Name:  PRECIOUS SEGALL  Date of Exam:   02/01/2021 Medical Rec #: [id number]        Accession #:    [id number] Date of Birth: Jan 23, 1945         Patient Gender: F Patient Age:   26 years Exam Location:  [facility] Procedure:      VAS [place] ABI WITH/WO TBI Referring Phys: Roderic Palau BERRY --------------------------------------------------------------------------------  Indications: Peripheral artery disease, and left SFA intervention. Patient does              not report any claudication symptoms or rest pain. She does report              bilateral leg pain but cancer has come back and now its in her              bones and she believes the pain is coming from that as well as the              medication she is currently taking for it. High Risk Factors: Hypertension, hyperlipidemia, current smoker.  Vascular Interventions: Successful chocolate balloon angioplasty followed by                         drug-eluting balloon angioplasty of a high-grade mid                         left SFA  stenosis with an excellent angiographic result                         on 06/19/2017. Comparison Study: In 01/2020, a lower arterial Doppler showed an ABI of 1.05,                   bilaterally. Performing Technologist: Sharlett Iles RVT  Examination Guidelines: A complete evaluation includes at minimum, Doppler waveform signals and systolic blood pressure reading at the level of bilateral brachial, anterior tibial, and posterior tibial arteries, when vessel segments are accessible. Bilateral testing is considered an integral part of a complete examination. Photoelectric Plethysmograph (PPG) waveforms and toe systolic pressure readings are included as required and additional duplex testing as needed. Limited examinations for reoccurring indications may be performed as noted.  ABI Findings: +---------+------------------+-----+---------+--------+  Right     Rt Pressure (mmHg) Index Waveform  Comment   +---------+------------------+-----+---------+--------+  Brachial  129                                          +---------+------------------+-----+---------+--------+  PTA       143                1.09  triphasic           +---------+------------------+-----+---------+--------+  PERO      152                      triphasic 1.16      +---------+------------------+-----+---------+--------+  DP        146                1.11  triphasic           +---------+------------------+-----+---------+--------+  Great Toe 133                1.02  Normal              +---------+------------------+-----+---------+--------+ +---------+------------------+-----+---------+---------+  Left      Lt Pressure (mmHg) Index Waveform  Comment    +---------+------------------+-----+---------+---------+  Brachial  131                                           +---------+------------------+-----+---------+---------+  PTA                                absent    inaudible  +---------+------------------+-----+---------+---------+  PERO      141                       triphasic 1.08       +---------+------------------+-----+---------+---------+  DP        130                0.99  triphasic            +---------+------------------+-----+---------+---------+  Great Toe 99                 0.76  Normal               +---------+------------------+-----+---------+---------+ +-------+-----------+-----------+------------+------------+  ABI/TBI Today's ABI Today's TBI Previous ABI Previous TBI  +-------+-----------+-----------+------------+------------+  Right   1.16        1.02        1.05         .81           +-------+-----------+-----------+------------+------------+  Left    1.08        .76         1.05         .62           +-------+-----------+-----------+------------+------------+  Bilateral ABIs and right TBIs appear essentially unchanged compared to prior study on 02/02/2020. Left TBIs appear increased compared to prior study on 02/02/2020.  Summary: Right: Resting right ankle-brachial index is within normal range. No evidence of significant right lower extremity arterial disease. The right toe-brachial index is normal. Left: Resting left ankle-brachial index is within normal range. No evidence of significant left lower extremity arterial disease. The left toe-brachial index is normal.  *See table(s) above for measurements and observations. See LE Arterial duplex report. Suggest follow up study in 12 months. Electronically signed by Quay Burow MD on 02/01/2021 at 6:17:26 PM.    Final    DG Hip Unilat W or Wo Pelvis 2-3 Views Left  Result Date: 02/05/2021 CLINICAL DATA:  Hip pain EXAM: DG HIP (WITH OR WITHOUT PELVIS) 2-3V LEFT COMPARISON:  Bone scan 01/03/2021 FINDINGS: SI joints are non widened. Pubic symphysis and rami appear intact. 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 No fracture or malalignment. IMPRESSION: 1. No acute osseous abnormality. 2. Heterogeneous sclerosis consistent with skeletal  metastatic disease Electronically Signed   By: Donavan Foil M.D.   On: 02/05/2021 15:23   DG Hip Unilat W or Wo Pelvis 2-3 Views Right  Result Date: 02/05/2021 CLINICAL DATA:  Hip pain EXAM: DG HIP (WITH OR WITHOUT PELVIS) 2-3V RIGHT COMPARISON:  Bone scan 01/03/2021 FINDINGS: SI joints are non widened. Pubic symphysis  is intact. No fracture or malalignment. Heterogeneous sclerosis within the femurs and pelvic bones consistent with history of skeletal osseous metastatic disease IMPRESSION: 1. No acute osseous abnormality 2. Heterogeneous sclerosis within the pelvic bones and femurs consistent with history of skeletal metastatic disease Electronically Signed   By: Donavan Foil M.D.   On: 02/05/2021 15:22   VAS [place] LOWER EXTREMITY ARTERIAL DUPLEX  Result Date: 02/01/2021 LOWER EXTREMITY ARTERIAL DUPLEX STUDY Patient Name:  ANVITHA HUTMACHER  Date of Exam:   02/01/2021 Medical Rec #: [id number]        Accession #:    [id number] Date of Birth: November 21, 1945         Patient Gender: F Patient Age:   19 years Exam Location:  [facility] Procedure:      VAS [place] LOWER EXTREMITY ARTERIAL DUPLEX Referring Phys: Roderic Palau BERRY --------------------------------------------------------------------------------  Indications: Peripheral artery disease, and left SFA intervention. Patient does              not report any claudication symptoms or rest pain. She does report              bilateral leg pain but cancer has come back and now its in her              bones and she believes the pain is coming from that as well as the              medication she is currently taking for it. High Risk Factors: Hypertension, hyperlipidemia, current smoker.  Vascular Interventions: Successful chocolate balloon angioplasty followed by                         drug-eluting balloon angioplasty of a high-grade mid                         left SFA stenosis with an excellent angiographic result                         on 06/19/2017. Current ABI:             1.16 on the right and 1.08 on the left. Comparison Study: In 01/2020, a lower arterial duplex showed 30-49% stenosis in                   the left proximal CFA. Patent left mid SFA without evidence of                   restenosis, s/p angioplasty. Performing Technologist: Sharlett Iles RVT  Examination Guidelines: A complete evaluation includes B-mode imaging, spectral Doppler, color Doppler, and power Doppler as needed of all accessible portions of each vessel. Bilateral testing is considered an integral part of a complete examination. Limited examinations for reoccurring indications may be performed as noted.   +----------+--------+-----+---------------+-----------+-------------+  LEFT       PSV cm/s Ratio Stenosis        Waveform    Comments       +----------+--------+-----+---------------+-----------+-------------+  CFA Prox   214            50-74% stenosis triphasic   low end range  +----------+--------+-----+---------------+-----------+-------------+  CFA Mid    191                            triphasic                  +----------+--------+-----+---------------+-----------+-------------+  CFA Distal 148                            triphasic                  +----------+--------+-----+---------------+-----------+-------------+  DFA        283                            triphasic   turbulent      +----------+--------+-----+---------------+-----------+-------------+  SFA Prox   155                            biphasic                   +----------+--------+-----+---------------+-----------+-------------+  SFA Mid    118                            triphasic                  +----------+--------+-----+---------------+-----------+-------------+  SFA Distal 166                            multiphasic                +----------+--------+-----+---------------+-----------+-------------+  POP Prox   144                            triphasic                   +----------+--------+-----+---------------+-----------+-------------+  POP Mid    145                            triphasic                  +----------+--------+-----+---------------+-----------+-------------+  POP Distal 153                            biphasic                   +----------+--------+-----+---------------+-----------+-------------+  TP Trunk   108                            triphasic                  +----------+--------+-----+---------------+-----------+-------------+  Summary: Left: No significant change as compared to previous study. Heterogeneous plaque throughout. 50-74% stenosis in the proximal CFA, low end range. Patent mid SFA without evidence of restenosis, s/p angioplasty.  See table(s) above for measurements and observations. See ABI report. Suggest follow up study in 12 months. Electronically signed by Quay Burow MD on 02/01/2021 at 6:17:09 PM.    Final     Assessment and Plan:  1.  Metastatic breast cancer 2.  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  Urinary retention secondary to #2 4.  Transaminitis 5.  Macrocytic anemia 6.  Hypertension 7.  Hyperlipidemia  -Discussed imaging findings with the patient.  We discussed that she has evidence of cord compression which are causing her symptoms of urinary retention and anesthesia in the pelvic  area.  Additionally, there are concerns for leptomeningeal disease. -Continue dexamethasone 4 mg IV every 6 hours. -Radiation oncology and neuro-oncology evaluation.  -Agree with proceeding with LP to evaluate for leptomeningeal disease. -Once we have information from the LP, we can discuss further treatment recommendations from a medical oncology standpoint. -We will check CT chest/abdomen/pelvis with contrast for restaging purposes and to evaluate for site of biopsy to confirm ER/PR/HER2 status.  Thank you for this referral.   Mikey Bussing, DNP, AGPCNP-BC, AOCNP   Attending  Note  I personally saw the patient, reviewed the chart and examined the patient. The plan of care was discussed with the patient and the admitting team. I agree with the assessment and plan as documented above. Thank you very much for the consultation. I agree with the plan, I discussed with Dr Mickeal Skinner who assessed the patient and based on discussion with him, it doesn't appear she has great systemic options. I agree with palliative radiation to alleviate her acute symptoms and discuss goals of care. She may not be able to tolerate total spine irradiation. We will continue to follow the patient.

## 2021-02-08 NOTE — Evaluation (Signed)
Physical Therapy Evaluation Patient Details Name: Leslie [name] MRN: [id number] DOB: 11/29/1945 Today's Date: 02/08/2021  History of Present Illness  Patient is a 76 year old female admitted with urinary retention, wer back pain with radiation shooting into the legs, 3 to 4-week history of saddle anesthesia. PMH recurrent breast cancer with mets to the bone currently on zoledronate, anastrozole,palbociclib, hypertension, hyperlipidemia.   Clinical Impression  Leslie SHIPPEE is 76 y.o. female admitted with above HPI and diagnosis. Patient is currently limited by functional impairments below (see PT problem list). Patient lives alone and is independent at baseline with [facility] for mobility. Patient had a recent fall and is having more weakness in bil LE's. She required min assist/guard for safety with gait and balance improved with Bil UE support on RW. Patient will benefit from continued skilled PT interventions to address impairments and progress independence with mobility, recommending HHPT when pt medically ready for discharge home with assist/supervision from family for mobility to reduce fall risk. Acute PT will follow and progress as able.        Recommendations for follow up therapy are one component of a multi-disciplinary discharge planning process, led by the attending physician.  Recommendations may be updated based on patient status, additional functional criteria and insurance authorization.  Follow Up Recommendations Home health PT    Assistance Recommended at Discharge Frequent or constant Supervision/Assistance  Patient can return home with the following  A little help with walking and/or transfers;Assistance with cooking/housework;Assist for transportation;Help with stairs or ramp for entrance;A little help with bathing/dressing/bathroom    Equipment Recommendations Rolling walker (2 wheels)  Recommendations for Other Services       Functional Status Assessment Patient has had  a recent decline in their functional status and demonstrates the ability to make significant improvements in function in a reasonable and predictable amount of time.     Precautions / Restrictions Precautions Precautions: Fall Restrictions Weight Bearing Restrictions: No      Mobility  Bed Mobility Overal bed mobility: Modified Independent             General bed mobility comments: HOB elevated and extra time to pivot to EOB.    Transfers Overall transfer level: Needs assistance Equipment used: Straight cane Transfers: Sit to/from Stand Sit to Stand: Min guard           General transfer comment: pt steady with rise from EOB with SPC, assist for line management.    Ambulation/Gait Ambulation/Gait assistance: Min assist, Min guard Gait Distance (Feet): 100 Feet (30,70) Assistive device: Straight cane, Rolling walker (2 wheels) Gait Pattern/deviations: Step-through pattern, Decreased stride length, Drifts right/left Gait velocity: decr     General Gait Details: pt drfiting wtih NBOS during gait with SPC and reaching with Lt UE for additional external support. RW provided and pt's balance improved with min guard for safety and cues for proximity to RW. no buckling noted throughout and good foot clearance. HR reached max of 131 bpm with gait and SpO2 on 3L/min 98% with gait.  Stairs            Wheelchair Mobility    Modified Rankin (Stroke Patients Only)       Balance Overall balance assessment: Needs assistance, History of Falls Sitting-balance support: Feet supported Sitting balance-Leahy Scale: Fair     Standing balance support: Single extremity supported, Bilateral upper extremity supported, Reliant on assistive device for balance Standing balance-Leahy Scale: Poor  Pertinent Vitals/Pain Pain Assessment Pain Assessment: No/denies pain    Home Living Family/patient expects to be discharged to:: Private  residence Living Arrangements: Alone Available Help at Discharge: Family (niece next door) Type of Home: House Home Access: Stairs to enter   Technical brewer of Steps: 2   Home Layout: One level Home Equipment: Cane - single point      Prior Function Prior Level of Function : History of Falls (last six months);Independent/Modified Independent             Mobility Comments: Uses cane at baseline. reports 1 fall in last 6 months       Hand Dominance   Dominant Hand: Right    Extremity/Trunk Assessment   Upper Extremity Assessment Upper Extremity Assessment: Defer to OT evaluation    Lower Extremity Assessment Lower Extremity Assessment: Overall WFL for tasks assessed    Cervical / Trunk Assessment Cervical / Trunk Assessment: Normal  Communication   Communication: No difficulties  Cognition Arousal/Alertness: Awake/alert Behavior During Therapy: WFL for tasks assessed/performed Overall Cognitive Status: Within Functional Limits for tasks assessed                                          General Comments      Exercises     Assessment/Plan    PT Assessment Patient needs continued PT services  PT Problem List Decreased strength;Decreased activity tolerance;Decreased balance;Decreased coordination;Decreased mobility;Decreased knowledge of use of DME;Impaired sensation;Decreased knowledge of precautions;Decreased safety awareness       PT Treatment Interventions DME instruction;Gait training;Stair training;Functional mobility training;Therapeutic activities;Therapeutic exercise;Balance training;Patient/family education;Neuromuscular re-education    PT Goals (Current goals can be found in the Care Plan section)  Acute Rehab PT Goals Patient Stated Goal: get better and go back home PT Goal Formulation: With patient Time For Goal Achievement: 02/22/21 Potential to Achieve Goals: Good    Frequency Min 3X/week     Co-evaluation  PT/OT/SLP Co-Evaluation/Treatment: Yes Reason for Co-Treatment: For patient/therapist safety;To address functional/ADL transfers PT goals addressed during session: Mobility/safety with mobility;Balance OT goals addressed during session: ADL's and self-care       AM-PAC PT "6 Clicks" Mobility  Outcome Measure Help needed turning from your back to your side while in a flat bed without using bedrails?: None Help needed moving from lying on your back to sitting on the side of a flat bed without using bedrails?: A Little Help needed moving to and from a bed to a chair (including a wheelchair)?: A Little Help needed standing up from a chair using your arms (e.g., wheelchair or bedside chair)?: A Little Help needed to walk in hospital room?: A Little Help needed climbing 3-5 steps with a railing? : A Lot 6 Click Score: 18    End of Session Equipment Utilized During Treatment: Gait belt Activity Tolerance: Patient tolerated treatment well Patient left: in chair;with call bell/phone within reach Nurse Communication: Mobility status PT Visit Diagnosis: Muscle weakness (generalized) (M62.81);Difficulty in walking, not elsewhere classified (R26.2)    Time: 0630-1601 PT Time Calculation (min) (ACUTE ONLY): 23 min   Charges:   PT Evaluation $PT Eval Moderate Complexity: 1 Mod          Verner Mould, DPT Acute Rehabilitation Services Office [phone] Pager [phone]   Jacques Navy 02/08/2021, 12:40 PM

## 2021-02-08 NOTE — Progress Notes (Signed)
Initial Nutrition Assessment  DOCUMENTATION CODES:   Severe malnutrition in context of chronic illness  INTERVENTION:  - diet advancement as medically feasible. - will order Ensure Enlive BID, each supplement provides 350 kcal and 20 grams of protein. - will order 1 tablet multivitamin with minerals/day.    NUTRITION DIAGNOSIS:   Severe Malnutrition related to chronic illness, cancer and cancer related treatments as evidenced by severe fat depletion, severe muscle depletion.  GOAL:   Patient will meet greater than or equal to 90% of their needs  MONITOR:   Diet advancement, PO intake, Supplement acceptance, Labs, Weight trends  REASON FOR ASSESSMENT:   Malnutrition Screening Tool  ASSESSMENT:   76 year old female with medical history of recurrent L breast cancer with mets to the bone currently on zoledronate, anastrozole, palbociclib, HTN, HLD, PAD, and depression. She presented to the ED due to urinary retention x2-3 days, some lower back pain with radiation shooting into the legs, 3 to 4-week hx of saddle anesthesia. Foley catheter placed with good urine output.  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 Neurosurgery consulted and recommended lumbar puncture and no surgical intervention at this time.  Patient sitting in the chair with no visitors present at the time of RD visit. Patient has been NPO since midnight pending lumbar puncture.   Patient reports that for dinner last night she had green beans, creamed potatoes, and 1/2 of a ham sandwich.   She shares that for the past 5-6 months she has had decreased appetite and has been experiencing taste alteration (bland taste). She is able to best taste things like chocolate, fruit, other sweet  items, and liver. Blander foods she is unable to taste.   She reports that she was started on megace 1-2 months ago and that she was taking up until admission but did not notice any change/improvement in appetite since starting on it.   She has no chewing or swallowing difficulties. No abdominal discomfort or nausea with or without PO intakes.  She ambulates using a cane at home. She has been experiencing weakness with ambulation, especially in LLE, and has been experiencing bilateral ankle swelling.   Weight today is documented as 100 lb and weight on 01/04/21 as 109 lb. This indicates 9 lb weight loss (9% body weight) in the past 1 month; significant for time frame.    Labs reviewed; CBG: 146 mg/dl, Alk Phos: 159 u/l (slightly up from 1/18), LFTs slightly elevated and down from 1/18.  Medications reviewed; 400 units cholecalciferol/day, 0.5 mg folvite/day, 17 g miralax/day, 40 mEq Klor-Con x1 dose 1/18, 1 tablet senokot BID, 400 units vitamin E/day.   IVF; NS @ 75 ml/hr.     NUTRITION - FOCUSED PHYSICAL EXAM:  Flowsheet Row Most Recent Value  Orbital Region Severe depletion  Upper Arm Region Severe depletion  Thoracic and Lumbar Region Unable to assess  Buccal Region Moderate depletion  Temple Region Moderate depletion  Clavicle Bone Region Severe depletion  Clavicle and Acromion Bone Region Severe depletion  Scapular Bone Region Moderate depletion  Dorsal Hand Moderate depletion  Patellar Region Severe depletion  Anterior Thigh Region Severe depletion  Posterior Calf Region Moderate depletion  Edema (RD Assessment) Mild  [BLE]  Hair Reviewed  Eyes Reviewed  Mouth Reviewed  Skin Reviewed  Nails Other (Comment)  [  nail polish]       Diet Order:   Diet Order             Diet NPO time specified Except for: Sips with Meds  Diet effective midnight                   EDUCATION NEEDS:   Education needs have been addressed  Skin:  Skin Assessment: Reviewed RN  Assessment  Last BM:  PTA/unknown  Height:   Ht Readings from Last 1 Encounters:  01/04/21 5' 0.75" (1.543 m)    Weight:   Wt Readings from Last 1 Encounters:  02/08/21 45.5 kg     Estimated Nutritional Needs:  Kcal:  1600-1850 kcal Protein:  80-95 grams Fluid:  >/= 1.8 L/day     Jarome Matin, MS, RD, LDN Inpatient Clinical Dietitian RD pager # available in [place]  After hours/weekend pager # available in [facility]

## 2021-02-08 NOTE — Consult Note (Signed)
[facility] Consult Note  Patient Care Team: Mayra Neer, MD as PCP - General (Family Medicine) Lorretta Harp, MD as Consulting Physician (Cardiology) Magrinat, Virgie Dad, MD as Consulting Physician (Oncology) Phylliss Bob, MD as Consulting Physician (Orthopedic Surgery)  CHIEF COMPLAINTS/PURPOSE OF CONSULTATION:  Leptomeningeal Dissemination  HISTORY OF PRESENTING ILLNESS:  Leslie [name] 76 y.o. female presented to ED with several weeks of progressive urinary complaints.  She describes "not being able to feel need to urinate, inability to initiate stream, and wetting herself".  She acknowledges "not feeling it" when she wipes herself, but denies numbness in her feet or legs.  Here balance has been impaired recently, walking with a cane at home.  She lives alone and takes care of her needs, family is nearby.  Recent breast cancer has been progressive.  MEDICAL HISTORY:  Past Medical History:  Diagnosis Date   Benign breast cyst in female    Right breast   Cancer of left breast ([place]) 2010   Depression    History of blood transfusion    "related to tubal pregnancy"   Hypertension    PAD (peripheral artery disease) ([place])     SURGICAL HISTORY: Past Surgical History:  Procedure Laterality Date   ABDOMINAL HYSTERECTOMY  1980s   BREAST CYST EXCISION Right ~ 2005   BREAST LUMPECTOMY Left 11/23/2008   ECTOPIC PREGNANCY SURGERY  1966   LOWER EXTREMITY ANGIOGRAM  06/19/2017   LOWER EXTREMITY ANGIOGRAPHY N/A 06/19/2017   Procedure: LOWER EXTREMITY ANGIOGRAPHY;  Surgeon: Lorretta Harp, MD;  Location: [facility];  Service: Cardiovascular;  Laterality: N/A;   PERIPHERAL VASCULAR BALLOON ANGIOPLASTY Left 06/19/2017   SFA    PERIPHERAL VASCULAR BALLOON ANGIOPLASTY  06/19/2017   Procedure: PERIPHERAL VASCULAR BALLOON ANGIOPLASTY;  Surgeon: Lorretta Harp, MD;  Location: [facility];  Service: Cardiovascular;;  SFA left    SOCIAL  HISTORY: Social History   Socioeconomic History   Marital status: Widowed    Spouse name: Not on file   Number of children: Not on file   Years of education: Not on file   Highest education level: Not on file  Occupational History   Not on file  Tobacco Use   Smoking status: Every Day    Packs/day: 0.50    Years: 45.00    Pack years: 22.50    Types: Cigarettes   Smokeless tobacco: Never  Vaping Use   Vaping Use: Never used  Substance and Sexual Activity   Alcohol use: Yes    Comment: 06/19/2017 "glass of wine 3-4 times/year"   Drug use: Never   Sexual activity: Not Currently    Birth control/protection: Post-menopausal, Surgical  Other Topics Concern   Not on file  Social History Narrative   Not on file   Social Determinants of Health   Financial Resource Strain: Not on file  Food Insecurity: Not on file  Transportation Needs: Not on file  Physical Activity: Not on file  Stress: Not on file  Social Connections: Not on file  Intimate Partner Violence: Not on file    FAMILY HISTORY: Family History  Problem Relation Age of Onset   Hypertension Mother    Seizures Mother    Heart disease Father    Hypertension Sister     ALLERGIES:  is allergic to aspirin, cetirizine, and rosuvastatin.  MEDICATIONS:  Current Facility-Administered Medications  Medication Dose Route Frequency Provider Last Rate Last Admin   0.9 %  sodium chloride  infusion   Intravenous Continuous Eugenie Filler, MD 75 mL/hr at 02/08/21 1435 New Bag at 02/08/21 1435   acetaminophen (TYLENOL) tablet 650 mg  650 mg Oral Q6H PRN Eugenie Filler, MD       Or   acetaminophen (TYLENOL) suppository 650 mg  650 mg Rectal Q6H PRN Eugenie Filler, MD       anastrozole (ARIMIDEX) tablet 1 mg  1 mg Oral Daily Eugenie Filler, MD   1 mg at 02/08/21 9798   Chlorhexidine Gluconate Cloth 2 % PADS 6 each  6 each Topical Daily Eugenie Filler, MD       cholecalciferol (VITAMIN D3) tablet 400 Units   400 Units Oral Daily Eugenie Filler, MD   400 Units at 02/08/21 0828   dexamethasone (DECADRON) injection 4 mg  4 mg Intravenous Q6H Eugenie Filler, MD   4 mg at 02/08/21 1431   feeding supplement (ENSURE ENLIVE / ENSURE PLUS) liquid 237 mL  237 mL Oral BID BM Eugenie Filler, MD       folic acid (FOLVITE) tablet 0.5 mg  0.5 mg Oral Daily Eugenie Filler, MD   0.5 mg at 02/08/21 0824   ipratropium-albuterol (DUONEB) 0.5-2.5 (3) MG/3ML nebulizer solution 3 mL  3 mL Nebulization Q2H PRN Eugenie Filler, MD       lidocaine (LIDODERM) 5 % 1 patch  1 patch Transdermal Q24H Eugenie Filler, MD   1 patch at 02/08/21 0826   lidocaine (XYLOCAINE) 1 % (with pres) injection            mirtazapine (REMERON) tablet 7.5 mg  7.5 mg Oral QHS Eugenie Filler, MD   7.5 mg at 02/07/21 2250   multivitamin with minerals tablet 1 tablet  1 tablet Oral Daily Eugenie Filler, MD   1 tablet at 02/08/21 1431   ondansetron (ZOFRAN) tablet 4 mg  4 mg Oral Q6H PRN Eugenie Filler, MD       Or   ondansetron [facility]) injection 4 mg  4 mg Intravenous Q6H PRN Eugenie Filler, MD       oxyCODONE (Oxy IR/ROXICODONE) immediate release tablet 5 mg  5 mg Oral Q4H PRN Eugenie Filler, MD   5 mg at 02/08/21 0150   oxyCODONE-acetaminophen (PERCOCET/ROXICET) 5-325 MG per tablet 1 tablet  1 tablet Oral Q6H PRN Eugenie Filler, MD       polyethylene glycol (MIRALAX / GLYCOLAX) packet 17 g  17 g Oral Daily Eugenie Filler, MD   17 g at 02/08/21 9211   senna (SENOKOT) tablet 8.6 mg  1 tablet Oral BID Eugenie Filler, MD   8.6 mg at 02/08/21 9417   sorbitol 70 % solution 30 mL  30 mL Oral Daily PRN Eugenie Filler, MD       vitamin E capsule 400 Units  400 Units Oral Daily Eugenie Filler, MD   400 Units at 02/08/21 0827    REVIEW OF SYSTEMS:   Constitutional: Denies fevers, chills or abnormal weight loss Eyes: Denies blurriness of vision Ears, nose, mouth, throat, and face: Denies  mucositis or sore throat Respiratory: Denies cough, dyspnea or wheezes Cardiovascular: Denies palpitation, chest discomfort or lower extremity swelling Gastrointestinal:  Denies nausea, constipation, diarrhea GU: Denies dysuria or incontinence Skin: Denies abnormal skin rashes Neurological: Per HPI Musculoskeletal: Denies joint pain, back or neck discomfort. No decrease in ROM Behavioral/Psych: Denies anxiety, disturbance in thought content, and mood  instability   PHYSICAL EXAMINATION: Vitals:   02/08/21 1300 02/08/21 1400  BP: (!) 132/57 (!) 165/129  Pulse:    Resp: (!) 24 18  Temp:    SpO2:  100%   KPS: 70. General: Alert, cooperative, pleasant, in no acute distress Head: Craniotomy scar noted, dry and intact. EENT: No conjunctival injection or scleral icterus. Oral mucosa moist Lungs: Resp effort normal Cardiac: Regular rate and rhythm Abdomen: Soft, non-distended abdomen Skin: No rashes cyanosis or petechiae. Extremities: No clubbing or edema  NEUROLOGIC EXAM: Mental Status: Awake, alert, attentive to examiner. Oriented to self and environment. Language is fluent with intact comprehension.  Cranial Nerves: Visual acuity is grossly normal. Visual fields are full. Extra-ocular movements intact. No ptosis. Face is symmetric, tongue midline. Motor: Tone and bulk are normal. Power is full in both arms and legs. Reflexes are symmetric, no pathologic reflexes present. Intact finger to nose bilaterally Sensory: Intact to light touch and temperature Gait: Deferred   LABORATORY DATA:  I have reviewed the data as listed Lab Results  Component Value Date   WBC 8.1 02/08/2021   HGB 7.4 (L) 02/08/2021   HCT 24.2 (L) 02/08/2021   MCV 115.2 (H) 02/08/2021   PLT 317 02/08/2021   Recent Labs    01/04/21 1412 02/05/21 1405 02/07/21 1234 02/07/21 1239 02/07/21 1341 02/08/21 0237  NA 142 135  --   --  136 136  K 3.2* 4.0  --   --  3.4* 4.4  CL 102 100  --   --  102 102  CO2  27 25  --   --  26 24  GLUCOSE 108* 100*  --   --  113* 152*  BUN 11 11  --   --  14 12  CREATININE 1.16* 0.83   < > 0.80 0.75 0.58  CALCIUM 10.0 9.1  --   --  9.2 9.0  GFRNONAA 49* >60  --  >60 >60 >60  PROT 7.9  --   --   --  7.7 7.1  ALBUMIN 3.8  --   --   --  3.2* 2.8*  AST 35  --   --   --  63* 56*  ALT 33  --   --   --  51* 48*  ALKPHOS 71  --   --   --  153* 159*  BILITOT 0.5  --   --   --  0.6 0.5   < > = values in this interval not displayed.    RADIOGRAPHIC STUDIES: I have personally reviewed the radiological images as listed and agreed with the findings in the report. DG Lumbar Spine Complete  Result Date: 02/05/2021 CLINICAL DATA:  Hip pain EXAM: LUMBAR SPINE - COMPLETE 4+ VIEW COMPARISON:  08/10/2019 FINDINGS: Levoscoliosis. Vertebral body heights are grossly maintained. 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 Multilevel degenerative change, advanced at L2-L3. Facet degenerative changes at multiple levels. IMPRESSION: 1. Scoliosis with degenerative changes most advanced at L2-L3. 2. 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   On: 02/05/2021 15:33   CT HEAD WO CONTRAST (5MM)  Result Date: 02/08/2021 CLINICAL DATA:  Study prior to lumbar puncture EXAM: CT HEAD WITHOUT CONTRAST TECHNIQUE: Contiguous axial images were obtained from the base of the skull through the vertex without intravenous contrast. RADIATION DOSE REDUCTION: This exam was performed according to the departmental dose-optimization program which includes automated exposure control, adjustment of the mA  and/or kV according to patient size and/or use of iterative reconstruction technique. COMPARISON:  MR brain done on 09/13/2009 FINDINGS: Brain: No acute intracranial findings are seen. Ventricles are not dilated. Cortical sulci are prominent. There are no epidural or subdural fluid collections. There is decreased density in the  periventricular white matter. Vascular: Unremarkable. Skull: Unremarkable. Sinuses/Orbits: Visualized paranasal sinuses are unremarkable. There is fluid in the right mastoid air cells. There is no break in the cortical margins in the right mastoid. Other: None IMPRESSION: No acute intracranial findings are seen. Atrophy. Small-vessel disease. There is fluid density in the right mastoid air cells suggesting right mastoid effusion or chronic right mastoiditis. Electronically Signed   By: Elmer Picker M.D.   On: 02/08/2021 12:43   MR THORACIC SPINE W WO CONTRAST  Result Date: 02/07/2021 CLINICAL DATA:  Back pain. Difficulty with urination. History of breast cancer with widespread osseous metastatic disease. EXAM: MRI THORACIC AND LUMBAR SPINE WITHOUT AND WITH CONTRAST TECHNIQUE: Multiplanar and multiecho pulse sequences of the thoracic and lumbar spine were obtained without and with intravenous contrast. CONTRAST:  4mL GADAVIST GADOBUTROL 1 MMOL/ML IV SOLN COMPARISON:  Lumbar spine radiographs 02/05/2021. Nuclear medicine whole-body bone scan 01/03/2021. Chest CT 01/03/2021. Lumbar spine MRI 10/04/2019. FINDINGS: MRI THORACIC SPINE FINDINGS The study is moderately motion degraded, greatest on axial sequences. Alignment: Mild lower thoracic dextroscoliosis. No significant listhesis. Vertebrae: Known widespread metastatic disease throughout the vertebral bodies and posterior elements at every thoracic level as well as in the included cervical spine. Pathologic T4 compression fracture with mild vertebral body height loss, similar to the prior chest CT. A T9 superior endplate Schmorl's node is unchanged. There is evidence of epidural tumor in the lower thoracic spine, greatest at T10-11 where there is resultant moderate spinal stenosis. The craniocaudal extent of epidural tumor is difficult to precisely determine although it may extend cranially to approximately T7-8. Cord: Abnormal enhancement along the surface  of the spinal cord at least throughout the mid and upper thoracic spine with extension towards the cervical region. Abnormal T2 hyperintensity centrally in the upper and, to a lesser extent, mid thoracic spinal cord which could reflect edema or a syrinx extending towards the cervical region and measuring up to approximately 4 mm in diameter near the cervicothoracic junction. Additional edema in the lower thoracic spinal cord. Paraspinal and other soft tissues: Small left pleural effusion, larger than on the prior chest CT. Widespread STIR hyperintensity and enhancement of the left greater than right posterior paraspinal musculature. Disc levels: Disc degeneration greatest at T10-11 and T11-12 where there is asymmetrically severe left-sided disc space narrowing. Up to moderate lower thoracic spinal stenosis due to epidural tumor. MRI LUMBAR SPINE FINDINGS Segmentation:  Standard. Alignment: Moderate lumbar levoscoliosis. No significant listhesis. Vertebrae: Chronic L4 Schmorl's nodes and mild chronic L3 compression fracture. Known widespread osseous metastatic disease throughout the lumbar spine and included pelvis. Epidural tumor at the thoracolumbar junction as noted above on the thoracic spine MRI. Mildly prominent epidural enhancement throughout the lumbar spine more caudally, indeterminate for epidural tumor but without a dominant epidural mass in the lumbar spine. Epidural tumor is suspected in the sacrum involving left-sided neural foramina. Prominent perineural cysts at multiple levels as previously seen. Conus medullaris: Extends to the T12-L1 level and demonstrates edema as noted above. There is abnormal nodular enhancement along the surface of the distal spinal cord/conus, and there also appears to be mild enhancement of cauda equina nerve roots in the lower lumbar and sacral canal.  Paraspinal and other soft tissues: Chronic asymmetric left psoas and left greater than right posterior paraspinal muscle  atrophy. Partially visualized extensive gluteal musculature enhancement bilaterally. Small right renal cyst. Disc levels: L1-2: Disc bulging and mild facet and ligamentum flavum hypertrophy result in moderate right lateral recess stenosis and moderate right and mild left neural foraminal stenosis, similar to the prior MRI. Borderline to mild spinal stenosis. L2-3: Disc bulging and moderate facet and ligamentum flavum hypertrophy result in mild right lateral recess stenosis and severe right neural foraminal stenosis, similar to the prior MRI. Borderline spinal stenosis. L3-4: Right eccentric disc bulging and mild-to-moderate facet and ligamentum flavum hypertrophy result in borderline to mild right lateral recess stenosis and mild right neural foraminal stenosis without spinal stenosis, similar to the prior MRI. L4-5: Minimal disc bulging without stenosis. L5-S1: Negative. IMPRESSION: 1. Known widespread metastatic disease throughout the thoracic and lumbar spine. 2. 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 3. Abnormal enhancement relatively diffusely along the surface of the thoracic spinal cord with suspected mild involvement of the cauda equina nerve roots compatible with left meningeal disease. 4. Edema in the distal spinal cord/conus as well as edema and/or syrinx in the upper thoracic cord extending into the cervical region. 5. Widespread edema/enhancement involving the thoracic paraspinal musculature and bilateral gluteal musculature, potentially denervation or rhabdomyolysis. 6. Small left pleural effusion. Electronically Signed   By: Logan Bores M.D.   On: 02/07/2021 16:11   MR Lumbar Spine W Wo Contrast  Result Date: 02/07/2021 CLINICAL DATA:  Back pain. Difficulty with urination. History of breast cancer with widespread osseous metastatic disease. EXAM: MRI THORACIC AND LUMBAR SPINE WITHOUT AND WITH CONTRAST TECHNIQUE: Multiplanar and multiecho pulse  sequences of the thoracic and lumbar spine were obtained without and with intravenous contrast. CONTRAST:  76mL GADAVIST GADOBUTROL 1 MMOL/ML IV SOLN COMPARISON:  Lumbar spine radiographs 02/05/2021. Nuclear medicine whole-body bone scan 01/03/2021. Chest CT 01/03/2021. Lumbar spine MRI 10/04/2019. FINDINGS: MRI THORACIC SPINE FINDINGS The study is moderately motion degraded, greatest on axial sequences. Alignment: Mild lower thoracic dextroscoliosis. No significant listhesis. Vertebrae: Known widespread metastatic disease throughout the vertebral bodies and posterior elements at every thoracic level as well as in the included cervical spine. Pathologic T4 compression fracture with mild vertebral body height loss, similar to the prior chest CT. A T9 superior endplate Schmorl's node is unchanged. There is evidence of epidural tumor in the lower thoracic spine, greatest at T10-11 where there is resultant moderate spinal stenosis. The craniocaudal extent of epidural tumor is difficult to precisely determine although it may extend cranially to approximately T7-8. Cord: Abnormal enhancement along the surface of the spinal cord at least throughout the mid and upper thoracic spine with extension towards the cervical region. Abnormal T2 hyperintensity centrally in the upper and, to a lesser extent, mid thoracic spinal cord which could reflect edema or a syrinx extending towards the cervical region and measuring up to approximately 4 mm in diameter near the cervicothoracic junction. Additional edema in the lower thoracic spinal cord. Paraspinal and other soft tissues: Small left pleural effusion, larger than on the prior chest CT. Widespread STIR hyperintensity and enhancement of the left greater than right posterior paraspinal musculature. Disc levels: Disc degeneration greatest at T10-11 and T11-12 where there is asymmetrically severe left-sided disc space narrowing. Up to moderate lower thoracic spinal stenosis due to  epidural tumor. MRI LUMBAR SPINE FINDINGS Segmentation:  Standard. Alignment: Moderate lumbar levoscoliosis. No significant  listhesis. Vertebrae: Chronic L4 Schmorl's nodes and mild chronic L3 compression fracture. Known widespread osseous metastatic disease throughout the lumbar spine and included pelvis. Epidural tumor at the thoracolumbar junction as noted above on the thoracic spine MRI. Mildly prominent epidural enhancement throughout the lumbar spine more caudally, indeterminate for epidural tumor but without a dominant epidural mass in the lumbar spine. Epidural tumor is suspected in the sacrum involving left-sided neural foramina. Prominent perineural cysts at multiple levels as previously seen. Conus medullaris: Extends to the T12-L1 level and demonstrates edema as noted above. There is abnormal nodular enhancement along the surface of the distal spinal cord/conus, and there also appears to be mild enhancement of cauda equina nerve roots in the lower lumbar and sacral canal. Paraspinal and other soft tissues: Chronic asymmetric left psoas and left greater than right posterior paraspinal muscle atrophy. Partially visualized extensive gluteal musculature enhancement bilaterally. Small right renal cyst. Disc levels: L1-2: Disc bulging and mild facet and ligamentum flavum hypertrophy result in moderate right lateral recess stenosis and moderate right and mild left neural foraminal stenosis, similar to the prior MRI. Borderline to mild spinal stenosis. L2-3: Disc bulging and moderate facet and ligamentum flavum hypertrophy result in mild right lateral recess stenosis and severe right neural foraminal stenosis, similar to the prior MRI. Borderline spinal stenosis. L3-4: Right eccentric disc bulging and mild-to-moderate facet and ligamentum flavum hypertrophy result in borderline to mild right lateral recess stenosis and mild right neural foraminal stenosis without spinal stenosis, similar to the prior MRI. L4-5:  Minimal disc bulging without stenosis. L5-S1: Negative. IMPRESSION: 1. Known widespread metastatic disease throughout the thoracic and lumbar spine. 2. 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 3. Abnormal enhancement relatively diffusely along the surface of the thoracic spinal cord with suspected mild involvement of the cauda equina nerve roots compatible with left meningeal disease. 4. Edema in the distal spinal cord/conus as well as edema and/or syrinx in the upper thoracic cord extending into the cervical region. 5. Widespread edema/enhancement involving the thoracic paraspinal musculature and bilateral gluteal musculature, potentially denervation or rhabdomyolysis. 6. Small left pleural effusion. Electronically Signed   By: Logan Bores M.D.   On: 02/07/2021 16:11   DG Chest Portable 1 View  Result Date: 02/07/2021 CLINICAL DATA:  Shortness of breath, difficulty urinating EXAM: PORTABLE CHEST 1 VIEW COMPARISON:  10/11/2020 FINDINGS: Mild cardiomegaly. Mild, diffuse bilateral interstitial pulmonary opacity. Small right pleural effusion. Unchanged elevation of the left hemidiaphragm with associated atelectasis or consolidation. The visualized skeletal structures are unremarkable. IMPRESSION: 1. Mild, diffuse bilateral interstitial pulmonary opacity, most consistent with edema. No focal airspace opacity. 2. Small right pleural effusion. 3. Unchanged elevation of the left hemidiaphragm with associated atelectasis or consolidation. Electronically Signed   By: Delanna Ahmadi M.D.   On: 02/07/2021 16:44   VAS [place] ABI WITH/WO TBI  Result Date: 02/01/2021  LOWER EXTREMITY DOPPLER STUDY Patient Name:  Leslie [name]  Date of Exam:   02/01/2021 Medical Rec #: [id number]        Accession #:    [id number] Date of Birth: January 02, 1946         Patient Gender: F Patient Age:   41 years Exam Location:  [facility] Procedure:      VAS [place] ABI WITH/WO TBI Referring Phys: Roderic Palau  BERRY --------------------------------------------------------------------------------  Indications: Peripheral artery disease, and left SFA intervention. Patient does              not  report any claudication symptoms or rest pain. She does report              bilateral leg pain but cancer has come back and now its in her              bones and she believes the pain is coming from that as well as the              medication she is currently taking for it. High Risk Factors: Hypertension, hyperlipidemia, current smoker.  Vascular Interventions: Successful chocolate balloon angioplasty followed by                         drug-eluting balloon angioplasty of a high-grade mid                         left SFA stenosis with an excellent angiographic result                         on 06/19/2017. Comparison Study: In 01/2020, a lower arterial Doppler showed an ABI of 1.05,                   bilaterally. Performing Technologist: Sharlett Iles RVT  Examination Guidelines: A complete evaluation includes at minimum, Doppler waveform signals and systolic blood pressure reading at the level of bilateral brachial, anterior tibial, and posterior tibial arteries, when vessel segments are accessible. Bilateral testing is considered an integral part of a complete examination. Photoelectric Plethysmograph (PPG) waveforms and toe systolic pressure readings are included as required and additional duplex testing as needed. Limited examinations for reoccurring indications may be performed as noted.  ABI Findings: +---------+------------------+-----+---------+--------+  Right     Rt Pressure (mmHg) Index Waveform  Comment   +---------+------------------+-----+---------+--------+  Brachial  129                                          +---------+------------------+-----+---------+--------+  PTA       143                1.09  triphasic           +---------+------------------+-----+---------+--------+  PERO      152                       triphasic 1.16      +---------+------------------+-----+---------+--------+  DP        146                1.11  triphasic           +---------+------------------+-----+---------+--------+  Great Toe 133                1.02  Normal              +---------+------------------+-----+---------+--------+ +---------+------------------+-----+---------+---------+  Left      Lt Pressure (mmHg) Index Waveform  Comment    +---------+------------------+-----+---------+---------+  Brachial  131                                           +---------+------------------+-----+---------+---------+  PTA  absent    inaudible  +---------+------------------+-----+---------+---------+  PERO      141                      triphasic 1.08       +---------+------------------+-----+---------+---------+  DP        130                0.99  triphasic            +---------+------------------+-----+---------+---------+  [place] 99                 0.76  Normal               +---------+------------------+-----+---------+---------+ +-------+-----------+-----------+------------+------------+  ABI/TBI Today's ABI Today's TBI Previous ABI Previous TBI  +-------+-----------+-----------+------------+------------+  Right   1.16        1.02        1.05         .81           +-------+-----------+-----------+------------+------------+  Left    1.08        .76         1.05         .62           +-------+-----------+-----------+------------+------------+  Bilateral ABIs and right TBIs appear essentially unchanged compared to prior study on 02/02/2020. Left TBIs appear increased compared to prior study on 02/02/2020.  Summary: Right: Resting right ankle-brachial index is within normal range. No evidence of significant right lower extremity arterial disease. The right toe-brachial index is normal. Left: Resting left ankle-brachial index is within normal range. No evidence of significant left lower extremity arterial disease. The left  toe-brachial index is normal.  *See table(s) above for measurements and observations. See LE Arterial duplex report. Suggest follow up study in 12 months. Electronically signed by Quay Burow MD on 02/01/2021 at 6:17:26 PM.    Final    DG Hip Unilat W or Wo Pelvis 2-3 Views Left  Result Date: 02/05/2021 CLINICAL DATA:  Hip pain EXAM: DG HIP (WITH OR WITHOUT PELVIS) 2-3V LEFT COMPARISON:  Bone scan 01/03/2021 FINDINGS: SI joints are non widened. Pubic symphysis and rami appear intact. 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 No fracture or malalignment. IMPRESSION: 1. No acute osseous abnormality. 2. 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   On: 02/05/2021 15:23   DG Hip Unilat W or Wo Pelvis 2-3 Views Right  Result Date: 02/05/2021 CLINICAL DATA:  Hip pain EXAM: DG HIP (WITH OR WITHOUT PELVIS) 2-3V RIGHT COMPARISON:  Bone scan 01/03/2021 FINDINGS: SI joints are non widened. Pubic symphysis is intact. No fracture or malalignment. Heterogeneous sclerosis within the femurs and pelvic bones consistent with history of skeletal osseous metastatic disease IMPRESSION: 1. No acute osseous abnormality 2. Heterogeneous sclerosis within the pelvic bones and femurs consistent with history of skeletal metastatic disease Electronically Signed   By: Donavan Foil M.D.   On: 02/05/2021 15:22   VAS [place] LOWER EXTREMITY ARTERIAL DUPLEX  Result Date: 02/01/2021 LOWER EXTREMITY ARTERIAL DUPLEX STUDY Patient Name:  Leslie [name]  Date of Exam:   02/01/2021 Medical Rec #: [id number]        Accession #:    [id number] Date of Birth: 08/09/1945         Patient Gender: F Patient Age:   65 years Exam Location:  [facility] Procedure:  VAS [place] LOWER EXTREMITY ARTERIAL DUPLEX Referring Phys: Roderic Palau BERRY --------------------------------------------------------------------------------  Indications: Peripheral  artery disease, and left SFA intervention. Patient does              not report any claudication symptoms or rest pain. She does report              bilateral leg pain but cancer has come back and now its in her              bones and she believes the pain is coming from that as well as the              medication she is currently taking for it. High Risk Factors: Hypertension, hyperlipidemia, current smoker.  Vascular Interventions: Successful chocolate balloon angioplasty followed by                         drug-eluting balloon angioplasty of a high-grade mid                         left SFA stenosis with an excellent angiographic result                         on 06/19/2017. Current ABI:            1.16 on the right and 1.08 on the left. Comparison Study: In 01/2020, a lower arterial duplex showed 30-49% stenosis in                   the left proximal CFA. Patent left mid SFA without evidence of                   restenosis, s/p angioplasty. Performing Technologist: Sharlett Iles RVT  Examination Guidelines: A complete evaluation includes B-mode imaging, spectral Doppler, color Doppler, and power Doppler as needed of all accessible portions of each vessel. Bilateral testing is considered an integral part of a complete examination. Limited examinations for reoccurring indications may be performed as noted.   +----------+--------+-----+---------------+-----------+-------------+  LEFT       PSV cm/s Ratio Stenosis        Waveform    Comments       +----------+--------+-----+---------------+-----------+-------------+  CFA Prox   214            50-74% stenosis triphasic   low end range  +----------+--------+-----+---------------+-----------+-------------+  CFA Mid    191                            triphasic                  +----------+--------+-----+---------------+-----------+-------------+  CFA Distal 148                            triphasic                   +----------+--------+-----+---------------+-----------+-------------+  DFA        283                            triphasic   turbulent      +----------+--------+-----+---------------+-----------+-------------+  SFA Prox   155  biphasic                   +----------+--------+-----+---------------+-----------+-------------+  SFA Mid    118                            triphasic                  +----------+--------+-----+---------------+-----------+-------------+  SFA Distal 166                            multiphasic                +----------+--------+-----+---------------+-----------+-------------+  POP Prox   144                            triphasic                  +----------+--------+-----+---------------+-----------+-------------+  POP Mid    145                            triphasic                  +----------+--------+-----+---------------+-----------+-------------+  POP Distal 153                            biphasic                   +----------+--------+-----+---------------+-----------+-------------+  TP Trunk   108                            triphasic                  +----------+--------+-----+---------------+-----------+-------------+  Summary: Left: No significant change as compared to previous study. Heterogeneous plaque throughout. 50-74% stenosis in the proximal CFA, low end range. Patent mid SFA without evidence of restenosis, s/p angioplasty.  See table(s) above for measurements and observations. See ABI report. Suggest follow up study in 12 months. Electronically signed by Quay Burow MD on 02/01/2021 at 6:17:09 PM.    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 EXAM: DIAGNOSTIC LUMBAR PUNCTURE UNDER FLUOROSCOPIC GUIDANCE COMPARISON:  Spine MRI 02/07/2021. Today's head CT, dictated separately. FLUOROSCOPY TIME:  Fluoroscopy Time:  2 minutes and 48 seconds Radiation  Exposure Index (if provided by the fluoroscopic device): 14.9 mGy Number of Acquired Spot Images: 0 PROCEDURE: Informed consent was obtained from the patient prior to the procedure, including potential complications of headache, allergy, and pain. With the patient prone, the lower back was prepped with Betadine. 1% Lidocaine was used for local anesthesia. Lumbar puncture was performed at the L4-5 level using a 20 gauge needle with return of clear CSF with an opening pressure of 9 cm water. After obtaining 2 vials each with 2 cc of clear CSF, the return of CSF became sanguinous. 1 cc of sanguinous CSF was obtained. At this point, the tubing was removed and the stylette placed in order to attempt to clear the CSF flow. Despite multiple maneuvers, CSF return could not be obtained a second time. The patient tolerated the procedure well and there were no apparent complications. IMPRESSION: Successful lumbar puncture, as  detailed above. Only a total of 5 cc of CSF were obtained, secondary to the CSF flow becoming sanguinous. Electronically Signed   By: Abigail Miyamoto M.D.   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    Systemically she is also progressive.  We reviewed options for treatment, which at this time mainly consist of various radiotherapy approaches, likely involving whole spine field given extent of disease.  CSF analysis is pending.    This will be discussed in greater detail in brain/spine tumor board on 02/12/21.   Radiation oncology also aware and  following.  Would also be reasonable to get palliative care involved here; patient understands none of our interventions will be curative, but could aid with symptoms and symptom progression.  Can discharge with 2mg  daily decadron if radiation not administered during this admission.  All questions were answered. The patient knows to call the clinic with any problems, questions or concerns.  The total time spent in the encounter was 55 minutes and more than 50% was on counseling and review of test results     Ventura Sellers, MD 02/08/2021 3:18 PM

## 2021-02-08 NOTE — Evaluation (Signed)
Occupational Therapy Evaluation Patient Details Name: Leslie [name] MRN: [id number] DOB: 17-Jun-1945 Today's Date: 02/08/2021   History of Present Illness Patient is a 76 year old female admitted with urinary retention, wer back pain with radiation shooting into the legs, 3 to 4-week history of saddle anesthesia. PMH recurrent breast cancer with mets to the bone currently on zoledronate, anastrozole,palbociclib, hypertension, hyperlipidemia   Clinical Impression   Patient lives home alone, with niece that lives next door. At baseline patient is Mod I using cane for ambulation and no assist for self care tasks. Currently patient is unsteady needing min A to stand, maintain balance due to initial posterior lean and for dynamic balance using cane. Patient HR increase to 130 and patient reporting fatigue therefore had seated rest then utilized walker for ambulation with improved stability. Recommend continued acute OT services to maximize patient safety and independence with self care in order to facilitate D/C to venue listed below.     Recommendations for follow up therapy are one component of a multi-disciplinary discharge planning process, led by the attending physician.  Recommendations may be updated based on patient status, additional functional criteria and insurance authorization.   Follow Up Recommendations  Home health OT    Assistance Recommended at Discharge Intermittent Supervision/Assistance  Patient can return home with the following Assistance with cooking/housework    Functional Status Assessment  Patient has had a recent decline in their functional status and demonstrates the ability to make significant improvements in function in a reasonable and predictable amount of time.  Equipment Recommendations  Tub/shower seat       Precautions / Restrictions Precautions Precautions: Fall Restrictions Weight Bearing Restrictions: No      Mobility Bed Mobility Overal bed  mobility: Modified Independent                      Balance Overall balance assessment: Needs assistance, History of Falls Sitting-balance support: Feet supported Sitting balance-Leahy Scale: Fair     Standing balance support: Single extremity supported, Bilateral upper extremity supported, Reliant on assistive device for balance Standing balance-Leahy Scale: Poor                             ADL either performed or assessed with clinical judgement   ADL Overall ADL's : Needs assistance/impaired Eating/Feeding: Independent   Grooming: Set up;Sitting   Upper Body Bathing: Set up;Sitting   Lower Body Bathing: Set up;Minimal assistance;Sitting/lateral leans;Sit to/from stand   Upper Body Dressing : Set up;Sitting   Lower Body Dressing: Set up;Minimal assistance;Sitting/lateral leans;Sit to/from stand   Toilet Transfer: Minimal assistance;Ambulation;Min guard;Rolling walker (2 wheels) (cane) Toilet Transfer Details (indicate cue type and reason): Initial sit to stand patient with posterior lean needing Min A to steady. Able to take steps using cane however reaching out for objects to stabilize with and is unsteady. Transitioned to use of rolling walker with much improvement to min G assist for functional ambulation and transfer to rolling walker. Toileting- Clothing Manipulation and Hygiene: Minimal assistance;Sitting/lateral lean;Sit to/from stand       Functional mobility during ADLs: Min guard;Minimal assistance;Rolling walker (2 wheels);Cane General ADL Comments: Patient HR up to 130 with ambulation needing seated rest breaks. Patient currently on 3L O2 with desat to 80s when doffed at edge of bed      Pertinent Vitals/Pain Pain Assessment Pain Assessment: No/denies pain     Hand Dominance Right   Extremity/Trunk  Assessment Upper Extremity Assessment Upper Extremity Assessment: Overall WFL for tasks assessed   Lower Extremity Assessment Lower  Extremity Assessment: Defer to PT evaluation   Cervical / Trunk Assessment Cervical / Trunk Assessment: Normal   Communication Communication Communication: No difficulties   Cognition Arousal/Alertness: Awake/alert Behavior During Therapy: WFL for tasks assessed/performed Overall Cognitive Status: Within Functional Limits for tasks assessed                                                  Home Living Family/patient expects to be discharged to:: Private residence Living Arrangements: Alone Available Help at Discharge: Family (niece next door) Type of Home: House Home Access: Stairs to enter Technical brewer of Steps: 2   Home Layout: One level     Bathroom Shower/Tub: Teacher, early years/pre: Standard     Home Equipment: Cane - single point          Prior Functioning/Environment Prior Level of Function : History of Falls (last six months);Independent/Modified Independent             Mobility Comments: Uses cane          OT Problem List: Decreased activity tolerance;Impaired balance (sitting and/or standing);Decreased safety awareness      OT Treatment/Interventions: Self-care/ADL training;Therapeutic exercise;DME and/or AE instruction;Therapeutic activities;Patient/family education;Balance training    OT Goals(Current goals can be found in the care plan section) Acute Rehab OT Goals Patient Stated Goal: Likes to travel, shop OT Goal Formulation: With patient Time For Goal Achievement: 02/22/21 Potential to Achieve Goals: Good  OT Frequency: Min 2X/week    Co-evaluation PT/OT/SLP Co-Evaluation/Treatment: Yes Reason for Co-Treatment: For patient/therapist safety;To address functional/ADL transfers PT goals addressed during session: Mobility/safety with mobility OT goals addressed during session: ADL's and self-care      AM-PAC OT "6 Clicks" Daily Activity     Outcome Measure Help from another person eating meals?:  None Help from another person taking care of personal grooming?: A Little Help from another person toileting, which includes using toliet, bedpan, or urinal?: A Little Help from another person bathing (including washing, rinsing, drying)?: A Little Help from another person to put on and taking off regular upper body clothing?: A Little Help from another person to put on and taking off regular lower body clothing?: A Little 6 Click Score: 19   End of Session Equipment Utilized During Treatment: Oxygen;Gait belt;Rolling walker (2 wheels);Other (comment) (cane) Nurse Communication: Mobility status  Activity Tolerance: Patient tolerated treatment well Patient left: in chair;with call bell/phone within reach  OT Visit Diagnosis: Unsteadiness on feet (R26.81);Other abnormalities of gait and mobility (R26.89);History of falling (Z91.81)                Time: 2263-3354 OT Time Calculation (min): 23 min Charges:  OT General Charges $OT Visit: 1 Visit OT Evaluation $OT Eval Low Complexity: [place] OT OT pager: Green 02/08/2021, 10:38 AM

## 2021-02-08 NOTE — Progress Notes (Signed)
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 Jana Hakim who has retired, hypertension, hyperlipidemia presented to the ED with urinary retention for the past 2 to 3 days, some lower back pain with radiation shooting into the legs, 3 to 4-week history of saddle anesthesia.  Foley catheter placed with good urine output.  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  Patient seen in consultation by neurosurgery, Dr. Ronnald Ramp who recommended lumbar puncture, no surgical intervention at this time, further evaluation by oncology and radiation oncology.   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 -MRI of the L and T-spine concerning for multiple epidural tumors/disease in the lower T-spine with moderate stenosis at T10.  Findings concerning for mild cauda equina. -Patient seen in consultation by neurosurgery, Dr. 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  Concerned that patient may also have some spinal stenosis in the lumbar region causing the claudication-like symptoms, bladder dysfunction may be more related to edema within the conus down from compression at T10 per neurosurgery. -Neurosurgery recommended high-volume lumbar puncture for cytology to rule out leptomeningeal disease, oncology, radiation oncology consultations. -IR consulted for lumbar puncture today. -Head CT ordered prior to LP today. -Spoke with medical oncology, neuro-oncology who will formally see the patient in consultation today.. -LP ordered under fluoroscopy per IR, per neurosurgical recommendations. -Continue Decadron 4 mg IV every 6 hours.   -Continue neurochecks every 4 hours.   -Supportive care.    2.  Urinary retention -Likely secondary to problem #1. -Foley catheter placed in the ED after bladder scan showed 700 cc of urinary retention with good urine output. -See #1. -May need urology input.  3.  Hypokalemia -Repleted.  Potassium at 4.4 this morning.  4.  Metastatic breast cancer -Continue home regimen of Arimidex.   -Medical oncology consulted and consultation pending.  5.  Hypertension -Patient states was on antihypertensive medications but PCP discontinued them per patient. -Blood pressure stable. -Follow.  6.  Hyperlipidemia -Stated was on a statin however held per her oncology recommendations. -Continue to hold statin.  7.  Transaminitis -Likely secondary to metastatic disease. -Follow.       DVT prophylaxis: SCDs Code Status: DNR Family Communication: Updated patient.  No family at bedside. Disposition:   Status is: Inpatient  Remains inpatient appropriate because: Severity of illness       Consultants:  Neuro oncology pending Medical oncology pending Neurosurgery: Dr. Ronnald Ramp 02/07/2021  Procedures:  CT head pending 02/08/2021 Chest x-ray 02/07/2021 MRI of the T and L-spine  02/07/2021     Antimicrobials:  None   Subjective: Able to turn better this morning per patient.  No chest pain.  No shortness of breath.  Denies any abdominal pain.  Foley catheter in place.  Objective: Vitals:   02/08/21 0200 02/08/21 0408 02/08/21 0500 02/08/21 0800  BP: 140/65     Pulse: 90     Resp: (!) 21     Temp:  98.2 F (36.8 C)  98.2 F (36.8 C)  TempSrc:  Oral  Oral  SpO2: 100%     Weight:   45.5 kg    No intake or output data in the 24 hours ending 02/08/21 0917 Filed Weights   02/08/21 0500  Weight: 45.5 kg    Examination:  General exam: Appears calm and comfortable  Respiratory system: Clear to auscultation. Respiratory effort normal. Cardiovascular system: S1 & S2 heard, RRR. No JVD, murmurs, rubs, gallops or clicks. No pedal edema. Gastrointestinal system: Abdomen is nondistended, soft and nontender. No organomegaly or masses felt. Normal bowel sounds heard. Central nervous system: Alert and oriented. No focal neurological deficits. Extremities: Symmetric 5 x 5 power. Skin: No rashes, lesions or ulcers Psychiatry: Judgement and insight appear normal. Mood & affect appropriate.     Data Reviewed: I have personally reviewed following labs and imaging studies  CBC: Recent Labs  Lab 02/05/21 1405 02/07/21 1341 02/08/21 0237  WBC 10.3 8.7 8.1  NEUTROABS 5.9 4.4 5.2  HGB 8.3* 7.6* 7.4*  HCT 26.3* 24.8* 24.2*  MCV 112.4* 112.2* 115.2*  PLT 458* 401* 681    Basic Metabolic Panel: Recent Labs  Lab 02/05/21 1405 02/07/21 1234 02/07/21 1239 02/07/21 1341 02/08/21 0237  NA 135  --   --  136 136  K 4.0  --   --  3.4* 4.4  CL 100  --   --  102 102  CO2 25  --   --  26 24  GLUCOSE 100*  --   --  113* 152*  BUN 11  --   --  14 12  CREATININE 0.83 0.90 0.80 0.75 0.58  CALCIUM 9.1  --   --  9.2 9.0  MG  --   --   --   --  2.2    GFR: Estimated Creatinine Clearance: 44.3 mL/min (by C-G formula based on SCr of 0.58 mg/dL).  Liver Function  Tests: Recent Labs  Lab 02/07/21 1341 02/08/21 0237  AST 63* 56*  ALT 51* 48*  ALKPHOS 153* 159*  BILITOT 0.6 0.5  PROT 7.7 7.1  ALBUMIN 3.2* 2.8*    CBG: Recent Labs  Lab 02/08/21 0805  GLUCAP 146*     Recent Results (from the past 240 hour(s))  Resp Panel by RT-PCR (Flu A&B, Covid) Nasopharyngeal Swab     Status: None   Collection Time: 02/07/21  1:57 PM   Specimen: Nasopharyngeal Swab; Nasopharyngeal(NP) swabs in vial transport medium  Result Value Ref Range Status   SARS Coronavirus 2 by RT PCR NEGATIVE NEGATIVE Final    Comment: (NOTE) SARS-CoV-2 target nucleic acids are NOT DETECTED.  The SARS-CoV-2 RNA is generally detectable in upper respiratory specimens during the acute phase of infection. The lowest concentration of SARS-CoV-2 viral copies this assay can detect is 138 copies/mL. A negative result does not preclude SARS-Cov-2 infection and should not be used as the sole basis for treatment or other patient management decisions. A negative result may occur with  improper specimen collection/handling, submission of specimen other than  nasopharyngeal swab, presence of viral mutation(s) within the areas targeted by this assay, and inadequate number of viral copies(<138 copies/mL). A negative result must be combined with clinical observations, patient history, and epidemiological information. The expected result is Negative.  Fact Sheet for Patients:  EntrepreneurPulse.com.au  Fact Sheet for Healthcare Providers:  IncredibleEmployment.be  This test is no t yet approved or cleared by the [place] FDA and  has been authorized for detection and/or diagnosis of SARS-CoV-2 by FDA under an Emergency Use Authorization (EUA). This EUA will remain  in effect (meaning this test can be used) for the duration of the COVID-19 declaration under Section 564(b)(1) of the Act, 21 U.S.C.section 360bbb-3(b)(1), unless the authorization  is terminated  or revoked sooner.       Influenza A by PCR NEGATIVE NEGATIVE Final   Influenza B by PCR NEGATIVE NEGATIVE Final    Comment: (NOTE) The Xpert Xpress SARS-CoV-2/FLU/RSV plus assay is intended as an aid in the diagnosis of influenza from Nasopharyngeal swab specimens and should not be used as a sole basis for treatment. Nasal washings and aspirates are unacceptable for Xpert Xpress SARS-CoV-2/FLU/RSV testing.  Fact Sheet for Patients: EntrepreneurPulse.com.au  Fact Sheet for Healthcare Providers: IncredibleEmployment.be  This test is not yet approved or cleared by the [place] FDA and has been authorized for detection and/or diagnosis of SARS-CoV-2 by FDA under an Emergency Use Authorization (EUA). This EUA will remain in effect (meaning this test can be used) for the duration of the COVID-19 declaration under Section 564(b)(1) of the Act, 21 U.S.C. section 360bbb-3(b)(1), unless the authorization is terminated or revoked.  Performed at [facility], [place] [address], [place], [place] [address]   MRSA Next Gen by PCR, Nasal     Status: None   Collection Time: 02/07/21 10:35 PM   Specimen: Nasal Mucosa; Nasal Swab  Result Value Ref Range Status   MRSA by PCR Next Gen NOT DETECTED NOT DETECTED Final    Comment: (NOTE) The GeneXpert MRSA Assay (FDA approved for NASAL specimens only), is one component of a comprehensive MRSA colonization surveillance program. It is not intended to diagnose MRSA infection nor to guide or monitor treatment for MRSA infections. Test performance is not FDA approved in patients less than 40 years old. Performed at [facility], [place] [address] [place], [place] [address]          Radiology Studies: MR THORACIC SPINE W WO CONTRAST  Result Date: 02/07/2021 CLINICAL DATA:  Back pain. Difficulty with urination. History of breast cancer with widespread  osseous metastatic disease. EXAM: MRI THORACIC AND LUMBAR SPINE WITHOUT AND WITH CONTRAST TECHNIQUE: Multiplanar and multiecho pulse sequences of the thoracic and lumbar spine were obtained without and with intravenous contrast. CONTRAST:  94mL GADAVIST GADOBUTROL 1 MMOL/ML IV SOLN COMPARISON:  Lumbar spine radiographs 02/05/2021. Nuclear medicine whole-body bone scan 01/03/2021. Chest CT 01/03/2021. Lumbar spine MRI 10/04/2019. FINDINGS: MRI THORACIC SPINE FINDINGS The study is moderately motion degraded, greatest on axial sequences. Alignment: Mild lower thoracic dextroscoliosis. No significant listhesis. Vertebrae: Known widespread metastatic disease throughout the vertebral bodies and posterior elements at every thoracic level as well as in the included cervical spine. Pathologic T4 compression fracture with mild vertebral body height loss, similar to the prior chest CT. A T9 superior endplate Schmorl's node is unchanged. There is evidence of epidural tumor in the lower thoracic spine, greatest at T10-11 where there is resultant moderate spinal stenosis. The craniocaudal extent of epidural tumor is  difficult to precisely determine although it may extend cranially to approximately T7-8. Cord: Abnormal enhancement along the surface of the spinal cord at least throughout the mid and upper thoracic spine with extension towards the cervical region. Abnormal T2 hyperintensity centrally in the upper and, to a lesser extent, mid thoracic spinal cord which could reflect edema or a syrinx extending towards the cervical region and measuring up to approximately 4 mm in diameter near the cervicothoracic junction. Additional edema in the lower thoracic spinal cord. Paraspinal and other soft tissues: Small left pleural effusion, larger than on the prior chest CT. Widespread STIR hyperintensity and enhancement of the left greater than right posterior paraspinal musculature. Disc levels: Disc degeneration greatest at T10-11 and  T11-12 where there is asymmetrically severe left-sided disc space narrowing. Up to moderate lower thoracic spinal stenosis due to epidural tumor. MRI LUMBAR SPINE FINDINGS Segmentation:  Standard. Alignment: Moderate lumbar levoscoliosis. No significant listhesis. Vertebrae: Chronic L4 Schmorl's nodes and mild chronic L3 compression fracture. Known widespread osseous metastatic disease throughout the lumbar spine and included pelvis. Epidural tumor at the thoracolumbar junction as noted above on the thoracic spine MRI. Mildly prominent epidural enhancement throughout the lumbar spine more caudally, indeterminate for epidural tumor but without a dominant epidural mass in the lumbar spine. Epidural tumor is suspected in the sacrum involving left-sided neural foramina. Prominent perineural cysts at multiple levels as previously seen. Conus medullaris: Extends to the T12-L1 level and demonstrates edema as noted above. There is abnormal nodular enhancement along the surface of the distal spinal cord/conus, and there also appears to be mild enhancement of cauda equina nerve roots in the lower lumbar and sacral canal. Paraspinal and other soft tissues: Chronic asymmetric left psoas and left greater than right posterior paraspinal muscle atrophy. Partially visualized extensive gluteal musculature enhancement bilaterally. Small right renal cyst. Disc levels: L1-2: Disc bulging and mild facet and ligamentum flavum hypertrophy result in moderate right lateral recess stenosis and moderate right and mild left neural foraminal stenosis, similar to the prior MRI. Borderline to mild spinal stenosis. L2-3: Disc bulging and moderate facet and ligamentum flavum hypertrophy result in mild right lateral recess stenosis and severe right neural foraminal stenosis, similar to the prior MRI. Borderline spinal stenosis. L3-4: Right eccentric disc bulging and mild-to-moderate facet and ligamentum flavum hypertrophy result in borderline to  mild right lateral recess stenosis and mild right neural foraminal stenosis without spinal stenosis, similar to the prior MRI. L4-5: Minimal disc bulging without stenosis. L5-S1: Negative. IMPRESSION: 1. Known widespread metastatic disease throughout the thoracic and lumbar spine. 2. 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 3. Abnormal enhancement relatively diffusely along the surface of the thoracic spinal cord with suspected mild involvement of the cauda equina nerve roots compatible with left meningeal disease. 4. Edema in the distal spinal cord/conus as well as edema and/or syrinx in the upper thoracic cord extending into the cervical region. 5. Widespread edema/enhancement involving the thoracic paraspinal musculature and bilateral gluteal musculature, potentially denervation or rhabdomyolysis. 6. Small left pleural effusion. Electronically Signed   By: Logan Bores M.D.   On: 02/07/2021 16:11   MR Lumbar Spine W Wo Contrast  Result Date: 02/07/2021 CLINICAL DATA:  Back pain. Difficulty with urination. History of breast cancer with widespread osseous metastatic disease. EXAM: MRI THORACIC AND LUMBAR SPINE WITHOUT AND WITH CONTRAST TECHNIQUE: Multiplanar and multiecho pulse sequences of the thoracic and lumbar spine were obtained without and with intravenous contrast.  CONTRAST:  37mL GADAVIST GADOBUTROL 1 MMOL/ML IV SOLN COMPARISON:  Lumbar spine radiographs 02/05/2021. Nuclear medicine whole-body bone scan 01/03/2021. Chest CT 01/03/2021. Lumbar spine MRI 10/04/2019. FINDINGS: MRI THORACIC SPINE FINDINGS The study is moderately motion degraded, greatest on axial sequences. Alignment: Mild lower thoracic dextroscoliosis. No significant listhesis. Vertebrae: Known widespread metastatic disease throughout the vertebral bodies and posterior elements at every thoracic level as well as in the included cervical spine. Pathologic T4 compression fracture with mild  vertebral body height loss, similar to the prior chest CT. A T9 superior endplate Schmorl's node is unchanged. There is evidence of epidural tumor in the lower thoracic spine, greatest at T10-11 where there is resultant moderate spinal stenosis. The craniocaudal extent of epidural tumor is difficult to precisely determine although it may extend cranially to approximately T7-8. Cord: Abnormal enhancement along the surface of the spinal cord at least throughout the mid and upper thoracic spine with extension towards the cervical region. Abnormal T2 hyperintensity centrally in the upper and, to a lesser extent, mid thoracic spinal cord which could reflect edema or a syrinx extending towards the cervical region and measuring up to approximately 4 mm in diameter near the cervicothoracic junction. Additional edema in the lower thoracic spinal cord. Paraspinal and other soft tissues: Small left pleural effusion, larger than on the prior chest CT. Widespread STIR hyperintensity and enhancement of the left greater than right posterior paraspinal musculature. Disc levels: Disc degeneration greatest at T10-11 and T11-12 where there is asymmetrically severe left-sided disc space narrowing. Up to moderate lower thoracic spinal stenosis due to epidural tumor. MRI LUMBAR SPINE FINDINGS Segmentation:  Standard. Alignment: Moderate lumbar levoscoliosis. No significant listhesis. Vertebrae: Chronic L4 Schmorl's nodes and mild chronic L3 compression fracture. Known widespread osseous metastatic disease throughout the lumbar spine and included pelvis. Epidural tumor at the thoracolumbar junction as noted above on the thoracic spine MRI. Mildly prominent epidural enhancement throughout the lumbar spine more caudally, indeterminate for epidural tumor but without a dominant epidural mass in the lumbar spine. Epidural tumor is suspected in the sacrum involving left-sided neural foramina. Prominent perineural cysts at multiple levels as  previously seen. Conus medullaris: Extends to the T12-L1 level and demonstrates edema as noted above. There is abnormal nodular enhancement along the surface of the distal spinal cord/conus, and there also appears to be mild enhancement of cauda equina nerve roots in the lower lumbar and sacral canal. Paraspinal and other soft tissues: Chronic asymmetric left psoas and left greater than right posterior paraspinal muscle atrophy. Partially visualized extensive gluteal musculature enhancement bilaterally. Small right renal cyst. Disc levels: L1-2: Disc bulging and mild facet and ligamentum flavum hypertrophy result in moderate right lateral recess stenosis and moderate right and mild left neural foraminal stenosis, similar to the prior MRI. Borderline to mild spinal stenosis. L2-3: Disc bulging and moderate facet and ligamentum flavum hypertrophy result in mild right lateral recess stenosis and severe right neural foraminal stenosis, similar to the prior MRI. Borderline spinal stenosis. L3-4: Right eccentric disc bulging and mild-to-moderate facet and ligamentum flavum hypertrophy result in borderline to mild right lateral recess stenosis and mild right neural foraminal stenosis without spinal stenosis, similar to the prior MRI. L4-5: Minimal disc bulging without stenosis. L5-S1: Negative. IMPRESSION: 1. Known widespread metastatic disease throughout the thoracic and lumbar spine. 2. 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 3. Abnormal enhancement relatively diffusely along the surface of the thoracic spinal cord with suspected mild involvement  of the cauda equina nerve roots compatible with left meningeal disease. 4. Edema in the distal spinal cord/conus as well as edema and/or syrinx in the upper thoracic cord extending into the cervical region. 5. Widespread edema/enhancement involving the thoracic paraspinal musculature and bilateral gluteal musculature,  potentially denervation or rhabdomyolysis. 6. Small left pleural effusion. Electronically Signed   By: Logan Bores M.D.   On: 02/07/2021 16:11   DG Chest Portable 1 View  Result Date: 02/07/2021 CLINICAL DATA:  Shortness of breath, difficulty urinating EXAM: PORTABLE CHEST 1 VIEW COMPARISON:  10/11/2020 FINDINGS: Mild cardiomegaly. Mild, diffuse bilateral interstitial pulmonary opacity. Small right pleural effusion. Unchanged elevation of the left hemidiaphragm with associated atelectasis or consolidation. The visualized skeletal structures are unremarkable. IMPRESSION: 1. Mild, diffuse bilateral interstitial pulmonary opacity, most consistent with edema. No focal airspace opacity. 2. Small right pleural effusion. 3. Unchanged elevation of the left hemidiaphragm with associated atelectasis or consolidation. Electronically Signed   By: Delanna Ahmadi M.D.   On: 02/07/2021 16:44        Scheduled Meds:  anastrozole  1 mg Oral Daily   Chlorhexidine Gluconate Cloth  6 each Topical Daily   cholecalciferol  400 Units Oral Daily   dexamethasone (DECADRON) injection  4 mg Intravenous Z2Y   folic acid  0.5 mg Oral Daily   lidocaine  1 patch Transdermal Q24H   mirtazapine  7.5 mg Oral QHS   polyethylene glycol  17 g Oral Daily   senna  1 tablet Oral BID   vitamin E  400 Units Oral Daily   Continuous Infusions:  sodium chloride 75 mL/hr at 02/07/21 2256     LOS: 1 day    Time spent: 40 minutes    Irine Seal, MD [facility]   To contact the attending provider between 7A-7P or the covering provider during after hours 7P-7A, please log into the web site [email or web address] and access using universal [place] password for that web site. If you do not have the password, please call the hospital operator.  02/08/2021, 9:17 AM

## 2021-02-08 NOTE — Progress Notes (Signed)
I was contacted by Dr. Ronnald Ramp, as this is a patient of mine. I have sense reviewed the patient imaging studies and medical record. I agree with his documented assessment. Unfortunately, this patient is in a difficult situation, and there is no surgery that would be expected to increase her function. I also agree with getting oncology involved, and possibly hospice care, depending on the input of oncology.

## 2021-02-08 NOTE — Consult Note (Signed)
Radiation Oncology         [phone] ________________________________  Name: Leslie [name]        MRN: [id number]  Date of Service: 02/08/21              DOB: [birth year]  KJ:LKML, Rockney Ghee, MD  Dr. Yetta Barre   REFERRING PHYSICIAN: Dr. Yetta Barre   DIAGNOSIS: The primary encounter diagnosis was Metastatic malignant neoplasm, unspecified site [facility]). Diagnoses of PAD (peripheral artery disease) (HCC), Status post percutaneous transluminal angioplasty (PTA), Spinal cord lesion (HCC), 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HCC), 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HCC), and Leptomeningeal disease were also pertinent to this visit.   HISTORY OF PRESENT ILLNESS: Leslie [name] is a 76 y.o. female seen at the request of Dr. 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  She received neoadjuvant letrozole followed by lumpectomy.  She had 2 remaining positive nodes of the 8 sampled and Oncotype score was 20 so she did not receive chemotherapy.  She received adjuvant radiotherapy which was completed in October 2011.  She was treated with letrozole for 5 years.  She was found to have recurrence with metastatic disease to the bone confirmed by bone marrow biopsy in October 2021.  She was restarted on antiestrogen therapy with Ibrance.  She was followed with Dr. Darnelle Catalan up until his retirement.  She presented to the emergency department on 02/05/2021 with progressive low back pain radiating into her thighs saddle anesthesia and urinary retention and fecal incontinence. Your call has been forwarded to an automated voice messaging for [id number] MRIs of her spine including thoracic and lumbar levels revealed widespread metastatic disease through every thoracic level what was included as well within the spine of the cervical region,  pathologic T4 compression fracture with vertebral body height loss, evidence of epidural tumor within the lower thoracic spine the greatest of which levels were T10-11 with resulting in moderate spinal stenosis craniocaudal extent of epidural tumor was difficult to delineate but could extend from T7-8 as well there was abnormal enhancement along the surface of the spinal cord at least throughout the mid and upper thoracic spine with extension towards the cervical region, abnormal T2 and enhancement in the upper and mid thoracic cord was noted with the area of the cervical thoracic junction being up to 4 mm of enhancement along the cord.  Her lumbar spine imaging also by MRI shown widespread disease with abnormal enhancement suspected at the cauda equina nerve roots compatible with leptomeningeal disease edema of the distal spinal cord/conus as well as edema and/or syrinx in the upper thoracic cord extending to the cervical region was noted.  Widespread edema and enhancement throughout the thoracic paraspinal musculature and bilateral gluteal musculature was also noted.  She is not a surgical candidate per Dr. Yetta Barre but he did recommend lumbar puncture to assess for leptomeningeal spread.  We have been asked to evaluate the patient for consideration of palliative radiotherapy.  Dr. Barbaraann Cao has also been asked to see the patient.  Of note she is currently receiving dexamethasone 4 mg every 6 hours.    PREVIOUS RADIATION THERAPY: Yes   September-October 2011: The patient received adjuvant radiotherapy to the left breast following lumpectomy.  She likely received 6-1/2 weeks of treatment   PAST MEDICAL HISTORY:  Past Medical History:  Diagnosis Date  Benign breast cyst in female    Right breast   Cancer of left breast ([place]) 2010   Depression    History of blood transfusion    "related to tubal pregnancy"   Hypertension    PAD (peripheral artery disease) ([place])        PAST SURGICAL HISTORY: Past  Surgical History:  Procedure Laterality Date   ABDOMINAL HYSTERECTOMY  1980s   BREAST CYST EXCISION Right ~ 2005   BREAST LUMPECTOMY Left 11/23/2008   ECTOPIC PREGNANCY SURGERY  1966   LOWER EXTREMITY ANGIOGRAM  06/19/2017   LOWER EXTREMITY ANGIOGRAPHY N/A 06/19/2017   Procedure: LOWER EXTREMITY ANGIOGRAPHY;  Surgeon: Lorretta Harp, MD;  Location: [facility];  Service: Cardiovascular;  Laterality: N/A;   PERIPHERAL VASCULAR BALLOON ANGIOPLASTY Left 06/19/2017   SFA    PERIPHERAL VASCULAR BALLOON ANGIOPLASTY  06/19/2017   Procedure: PERIPHERAL VASCULAR BALLOON ANGIOPLASTY;  Surgeon: Lorretta Harp, MD;  Location: [facility];  Service: Cardiovascular;;  SFA left     FAMILY HISTORY:  Family History  Problem Relation Age of Onset   Hypertension Mother    Seizures Mother    Heart disease Father    Hypertension Sister      SOCIAL HISTORY:  reports that she has been smoking cigarettes. She has a 22.50 pack-year smoking history. She has never used smokeless tobacco. She reports current alcohol use. She reports that she does not use drugs. The patient is single and lives in [place]. She has three adult sons who live in [place], [place], and [place]. She has a niece who lives next door to her that she considers like a daughter who helps her if she needs assistance.    ALLERGIES: Aspirin, Cetirizine, and Rosuvastatin   MEDICATIONS:  Current Facility-Administered Medications  Medication Dose Route Frequency Provider Last Rate Last Admin   0.9 %  sodium chloride infusion   Intravenous Continuous Eugenie Filler, MD 75 mL/hr at 02/07/21 2256 New Bag at 02/07/21 2256   acetaminophen (TYLENOL) tablet 650 mg  650 mg Oral Q6H PRN Eugenie Filler, MD       Or   acetaminophen (TYLENOL) suppository 650 mg  650 mg Rectal Q6H PRN Eugenie Filler, MD       anastrozole (ARIMIDEX) tablet 1 mg  1 mg Oral Daily Eugenie Filler, MD   1 mg at 02/08/21 7408    Chlorhexidine Gluconate Cloth 2 % PADS 6 each  6 each Topical Daily Eugenie Filler, MD       cholecalciferol (VITAMIN D3) tablet 400 Units  400 Units Oral Daily Eugenie Filler, MD   400 Units at 02/08/21 0828   dexamethasone (DECADRON) injection 4 mg  4 mg Intravenous Q6H Eugenie Filler, MD   4 mg at 02/08/21 1448   feeding supplement (ENSURE ENLIVE / ENSURE PLUS) liquid 237 mL  237 mL Oral BID BM Eugenie Filler, MD       folic acid (FOLVITE) tablet 0.5 mg  0.5 mg Oral Daily Eugenie Filler, MD   0.5 mg at 02/08/21 0824   ipratropium-albuterol (DUONEB) 0.5-2.5 (3) MG/3ML nebulizer solution 3 mL  3 mL Nebulization Q2H PRN Eugenie Filler, MD       lidocaine (LIDODERM) 5 % 1 patch  1 patch Transdermal Q24H Eugenie Filler, MD   1 patch at 02/08/21 0826   lidocaine (XYLOCAINE) 1 % (with pres) injection  mirtazapine (REMERON) tablet 7.5 mg  7.5 mg Oral QHS Eugenie Filler, MD   7.5 mg at 02/07/21 2250   multivitamin with minerals tablet 1 tablet  1 tablet Oral Daily Eugenie Filler, MD       ondansetron [facility]) tablet 4 mg  4 mg Oral Q6H PRN Eugenie Filler, MD       Or   ondansetron [facility]) injection 4 mg  4 mg Intravenous Q6H PRN Eugenie Filler, MD       oxyCODONE (Oxy IR/ROXICODONE) immediate release tablet 5 mg  5 mg Oral Q4H PRN Eugenie Filler, MD   5 mg at 02/08/21 0150   oxyCODONE-acetaminophen (PERCOCET/ROXICET) 5-325 MG per tablet 1 tablet  1 tablet Oral Q6H PRN Eugenie Filler, MD       polyethylene glycol (MIRALAX / GLYCOLAX) packet 17 g  17 g Oral Daily Eugenie Filler, MD   17 g at 02/08/21 1062   senna (SENOKOT) tablet 8.6 mg  1 tablet Oral BID Eugenie Filler, MD   8.6 mg at 02/08/21 0826   sorbitol 70 % solution 30 mL  30 mL Oral Daily PRN Eugenie Filler, MD       vitamin E capsule 400 Units  400 Units Oral Daily Eugenie Filler, MD   400 Units at 02/08/21 0827     REVIEW OF SYSTEMS: On review of systems, the  patient reports that she is doing okay. She was able to tolerate her LP procedure, and states that she was able to feel the sensation of the local anesthesia being administered. She feels that her sensory perception has improved with steroids as she has been able to feel some sensation of her feet and legs with light touch, and has noticed improvement now as well in perineal sensation. She reports improvement in her back pain as well. She was encouraged that she could move from her hospital bed onto the radiology procedure table for her LP today. No other complaints are noted.      PHYSICAL EXAM:  Wt Readings from Last 3 Encounters:  02/08/21 100 lb 5 oz (45.5 kg)  01/04/21 109 lb 3 oz (49.5 kg)  11/09/20 110 lb 11.2 oz (50.2 kg)   Temp Readings from Last 3 Encounters:  02/08/21 97.8 F (36.6 C) (Oral)  02/05/21 98.7 F (37.1 C) (Oral)  01/04/21 98.1 F (36.7 C) (Temporal)   BP Readings from Last 3 Encounters:  02/08/21 140/65  02/05/21 124/63  01/04/21 (!) 133/53   Pulse Readings from Last 3 Encounters:  02/08/21 90  02/05/21 94  01/04/21 84   Pain Assessment Pain Score: Asleep/10  In general this is a thin but otherwise well appearing African American female in no acute distress. She's alert and oriented x4 and appropriate throughout the examination. Cardiopulmonary assessment is negative for acute distress and she exhibits normal effort. She has intact sensation to light touch of both feet and lower legs. Her strength is 4/5 in both lower extremities.     ECOG = 3-4  0 - Asymptomatic (Fully active, able to carry on all predisease activities without restriction)  1 - Symptomatic but completely ambulatory (Restricted in physically strenuous activity but ambulatory and able to carry out work of a light or sedentary nature. For example, light housework, office work)  2 - Symptomatic, <50% in bed during the day (Ambulatory and capable of all self care but unable to carry out  any work activities. Up and about  more than 50% of waking hours)  3 - Symptomatic, >50% in bed, but not bedbound (Capable of only limited self-care, confined to bed or chair 50% or more of waking hours)  4 - Bedbound (Completely disabled. Cannot carry on any self-care. Totally confined to bed or chair)  5 - Death   Eustace Pen MM, Creech RH, Tormey DC, et al. [phone]). "Toxicity and response criteria of the [facility]". [place] Oncol. 5 (6): 649-55    LABORATORY DATA:  Lab Results  Component Value Date   WBC 8.1 02/08/2021   HGB 7.4 (L) 02/08/2021   HCT 24.2 (L) 02/08/2021   MCV 115.2 (H) 02/08/2021   PLT 317 02/08/2021   Lab Results  Component Value Date   NA 136 02/08/2021   K 4.4 02/08/2021   CL 102 02/08/2021   CO2 24 02/08/2021   Lab Results  Component Value Date   ALT 48 (H) 02/08/2021   AST 56 (H) 02/08/2021   ALKPHOS 159 (H) 02/08/2021   BILITOT 0.5 02/08/2021      RADIOGRAPHY: DG Lumbar Spine Complete  Result Date: 02/05/2021 CLINICAL DATA:  Hip pain EXAM: LUMBAR SPINE - COMPLETE 4+ VIEW COMPARISON:  08/10/2019 FINDINGS: Levoscoliosis. Vertebral body heights are grossly maintained. 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 Multilevel degenerative change, advanced at L2-L3. Facet degenerative changes at multiple levels. IMPRESSION: 1. Scoliosis with degenerative changes most advanced at L2-L3. 2. 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   On: 02/05/2021 15:33   MR THORACIC SPINE W WO CONTRAST  Result Date: 02/07/2021 CLINICAL DATA:  Back pain. Difficulty with urination. History of breast cancer with widespread osseous metastatic disease. EXAM: MRI THORACIC AND LUMBAR SPINE WITHOUT AND WITH CONTRAST TECHNIQUE: Multiplanar and multiecho pulse sequences of the thoracic and lumbar spine were obtained without and with intravenous contrast. CONTRAST:  28mL GADAVIST  GADOBUTROL 1 MMOL/ML IV SOLN COMPARISON:  Lumbar spine radiographs 02/05/2021. Nuclear medicine whole-body bone scan 01/03/2021. Chest CT 01/03/2021. Lumbar spine MRI 10/04/2019. FINDINGS: MRI THORACIC SPINE FINDINGS The study is moderately motion degraded, greatest on axial sequences. Alignment: Mild lower thoracic dextroscoliosis. No significant listhesis. Vertebrae: Known widespread metastatic disease throughout the vertebral bodies and posterior elements at every thoracic level as well as in the included cervical spine. Pathologic T4 compression fracture with mild vertebral body height loss, similar to the prior chest CT. A T9 superior endplate Schmorl's node is unchanged. There is evidence of epidural tumor in the lower thoracic spine, greatest at T10-11 where there is resultant moderate spinal stenosis. The craniocaudal extent of epidural tumor is difficult to precisely determine although it may extend cranially to approximately T7-8. Cord: Abnormal enhancement along the surface of the spinal cord at least throughout the mid and upper thoracic spine with extension towards the cervical region. Abnormal T2 hyperintensity centrally in the upper and, to a lesser extent, mid thoracic spinal cord which could reflect edema or a syrinx extending towards the cervical region and measuring up to approximately 4 mm in diameter near the cervicothoracic junction. Additional edema in the lower thoracic spinal cord. Paraspinal and other soft tissues: Small left pleural effusion, larger than on the prior chest CT. Widespread STIR hyperintensity and enhancement of the left greater than right posterior paraspinal musculature. Disc levels: Disc degeneration greatest at T10-11 and T11-12 where there is asymmetrically severe left-sided disc space narrowing. Up to moderate lower thoracic spinal stenosis due to epidural tumor. MRI  LUMBAR SPINE FINDINGS Segmentation:  Standard. Alignment: Moderate lumbar levoscoliosis. No significant  listhesis. Vertebrae: Chronic L4 Schmorl's nodes and mild chronic L3 compression fracture. Known widespread osseous metastatic disease throughout the lumbar spine and included pelvis. Epidural tumor at the thoracolumbar junction as noted above on the thoracic spine MRI. Mildly prominent epidural enhancement throughout the lumbar spine more caudally, indeterminate for epidural tumor but without a dominant epidural mass in the lumbar spine. Epidural tumor is suspected in the sacrum involving left-sided neural foramina. Prominent perineural cysts at multiple levels as previously seen. Conus medullaris: Extends to the T12-L1 level and demonstrates edema as noted above. There is abnormal nodular enhancement along the surface of the distal spinal cord/conus, and there also appears to be mild enhancement of cauda equina nerve roots in the lower lumbar and sacral canal. Paraspinal and other soft tissues: Chronic asymmetric left psoas and left greater than right posterior paraspinal muscle atrophy. Partially visualized extensive gluteal musculature enhancement bilaterally. Small right renal cyst. Disc levels: L1-2: Disc bulging and mild facet and ligamentum flavum hypertrophy result in moderate right lateral recess stenosis and moderate right and mild left neural foraminal stenosis, similar to the prior MRI. Borderline to mild spinal stenosis. L2-3: Disc bulging and moderate facet and ligamentum flavum hypertrophy result in mild right lateral recess stenosis and severe right neural foraminal stenosis, similar to the prior MRI. Borderline spinal stenosis. L3-4: Right eccentric disc bulging and mild-to-moderate facet and ligamentum flavum hypertrophy result in borderline to mild right lateral recess stenosis and mild right neural foraminal stenosis without spinal stenosis, similar to the prior MRI. L4-5: Minimal disc bulging without stenosis. L5-S1: Negative. IMPRESSION: 1. Known widespread metastatic disease throughout the  thoracic and lumbar spine. 2. 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 3. Abnormal enhancement relatively diffusely along the surface of the thoracic spinal cord with suspected mild involvement of the cauda equina nerve roots compatible with left meningeal disease. 4. Edema in the distal spinal cord/conus as well as edema and/or syrinx in the upper thoracic cord extending into the cervical region. 5. Widespread edema/enhancement involving the thoracic paraspinal musculature and bilateral gluteal musculature, potentially denervation or rhabdomyolysis. 6. Small left pleural effusion. Electronically Signed   By: Logan Bores M.D.   On: 02/07/2021 16:11   MR Lumbar Spine W Wo Contrast  Result Date: 02/07/2021 CLINICAL DATA:  Back pain. Difficulty with urination. History of breast cancer with widespread osseous metastatic disease. EXAM: MRI THORACIC AND LUMBAR SPINE WITHOUT AND WITH CONTRAST TECHNIQUE: Multiplanar and multiecho pulse sequences of the thoracic and lumbar spine were obtained without and with intravenous contrast. CONTRAST:  34mL GADAVIST GADOBUTROL 1 MMOL/ML IV SOLN COMPARISON:  Lumbar spine radiographs 02/05/2021. Nuclear medicine whole-body bone scan 01/03/2021. Chest CT 01/03/2021. Lumbar spine MRI 10/04/2019. FINDINGS: MRI THORACIC SPINE FINDINGS The study is moderately motion degraded, greatest on axial sequences. Alignment: Mild lower thoracic dextroscoliosis. No significant listhesis. Vertebrae: Known widespread metastatic disease throughout the vertebral bodies and posterior elements at every thoracic level as well as in the included cervical spine. Pathologic T4 compression fracture with mild vertebral body height loss, similar to the prior chest CT. A T9 superior endplate Schmorl's node is unchanged. There is evidence of epidural tumor in the lower thoracic spine, greatest at T10-11 where there is resultant moderate spinal stenosis. The  craniocaudal extent of epidural tumor is difficult to precisely determine although it may extend cranially to approximately T7-8. Cord: Abnormal enhancement along the surface  of the spinal cord at least throughout the mid and upper thoracic spine with extension towards the cervical region. Abnormal T2 hyperintensity centrally in the upper and, to a lesser extent, mid thoracic spinal cord which could reflect edema or a syrinx extending towards the cervical region and measuring up to approximately 4 mm in diameter near the cervicothoracic junction. Additional edema in the lower thoracic spinal cord. Paraspinal and other soft tissues: Small left pleural effusion, larger than on the prior chest CT. Widespread STIR hyperintensity and enhancement of the left greater than right posterior paraspinal musculature. Disc levels: Disc degeneration greatest at T10-11 and T11-12 where there is asymmetrically severe left-sided disc space narrowing. Up to moderate lower thoracic spinal stenosis due to epidural tumor. MRI LUMBAR SPINE FINDINGS Segmentation:  Standard. Alignment: Moderate lumbar levoscoliosis. No significant listhesis. Vertebrae: Chronic L4 Schmorl's nodes and mild chronic L3 compression fracture. Known widespread osseous metastatic disease throughout the lumbar spine and included pelvis. Epidural tumor at the thoracolumbar junction as noted above on the thoracic spine MRI. Mildly prominent epidural enhancement throughout the lumbar spine more caudally, indeterminate for epidural tumor but without a dominant epidural mass in the lumbar spine. Epidural tumor is suspected in the sacrum involving left-sided neural foramina. Prominent perineural cysts at multiple levels as previously seen. Conus medullaris: Extends to the T12-L1 level and demonstrates edema as noted above. There is abnormal nodular enhancement along the surface of the distal spinal cord/conus, and there also appears to be mild enhancement of cauda equina  nerve roots in the lower lumbar and sacral canal. Paraspinal and other soft tissues: Chronic asymmetric left psoas and left greater than right posterior paraspinal muscle atrophy. Partially visualized extensive gluteal musculature enhancement bilaterally. Small right renal cyst. Disc levels: L1-2: Disc bulging and mild facet and ligamentum flavum hypertrophy result in moderate right lateral recess stenosis and moderate right and mild left neural foraminal stenosis, similar to the prior MRI. Borderline to mild spinal stenosis. L2-3: Disc bulging and moderate facet and ligamentum flavum hypertrophy result in mild right lateral recess stenosis and severe right neural foraminal stenosis, similar to the prior MRI. Borderline spinal stenosis. L3-4: Right eccentric disc bulging and mild-to-moderate facet and ligamentum flavum hypertrophy result in borderline to mild right lateral recess stenosis and mild right neural foraminal stenosis without spinal stenosis, similar to the prior MRI. L4-5: Minimal disc bulging without stenosis. L5-S1: Negative. IMPRESSION: 1. Known widespread metastatic disease throughout the thoracic and lumbar spine. 2. 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 3. Abnormal enhancement relatively diffusely along the surface of the thoracic spinal cord with suspected mild involvement of the cauda equina nerve roots compatible with left meningeal disease. 4. Edema in the distal spinal cord/conus as well as edema and/or syrinx in the upper thoracic cord extending into the cervical region. 5. Widespread edema/enhancement involving the thoracic paraspinal musculature and bilateral gluteal musculature, potentially denervation or rhabdomyolysis. 6. Small left pleural effusion. Electronically Signed   By: Logan Bores M.D.   On: 02/07/2021 16:11   DG Chest Portable 1 View  Result Date: 02/07/2021 CLINICAL DATA:  Shortness of breath, difficulty urinating  EXAM: PORTABLE CHEST 1 VIEW COMPARISON:  10/11/2020 FINDINGS: Mild cardiomegaly. Mild, diffuse bilateral interstitial pulmonary opacity. Small right pleural effusion. Unchanged elevation of the left hemidiaphragm with associated atelectasis or consolidation. The visualized skeletal structures are unremarkable. IMPRESSION: 1. Mild, diffuse bilateral interstitial pulmonary opacity, most consistent with edema. No focal airspace opacity. 2. Small right  pleural effusion. 3. Unchanged elevation of the left hemidiaphragm with associated atelectasis or consolidation. Electronically Signed   By: Delanna Ahmadi M.D.   On: 02/07/2021 16:44   VAS [place] ABI WITH/WO TBI  Result Date: 02/01/2021  LOWER EXTREMITY DOPPLER STUDY Patient Name:  Leslie [name]  Date of Exam:   02/01/2021 Medical Rec #: [id number]        Accession #:    [id number] Date of Birth: 02/04/1945         Patient Gender: F Patient Age:   76 years Exam Location:  [facility] Procedure:      VAS [place] ABI WITH/WO TBI Referring Phys: Roderic Palau BERRY --------------------------------------------------------------------------------  Indications: Peripheral artery disease, and left SFA intervention. Patient does              not report any claudication symptoms or rest pain. She does report              bilateral leg pain but cancer has come back and now its in her              bones and she believes the pain is coming from that as well as the              medication she is currently taking for it. High Risk Factors: Hypertension, hyperlipidemia, current smoker.  Vascular Interventions: Successful chocolate balloon angioplasty followed by                         drug-eluting balloon angioplasty of a high-grade mid                         left SFA stenosis with an excellent angiographic result                         on 06/19/2017. Comparison Study: In 01/2020, a lower arterial Doppler showed an ABI of 1.05,                   bilaterally. Performing Technologist: Sharlett Iles  RVT  Examination Guidelines: A complete evaluation includes at minimum, Doppler waveform signals and systolic blood pressure reading at the level of bilateral brachial, anterior tibial, and posterior tibial arteries, when vessel segments are accessible. Bilateral testing is considered an integral part of a complete examination. Photoelectric Plethysmograph (PPG) waveforms and toe systolic pressure readings are included as required and additional duplex testing as needed. Limited examinations for reoccurring indications may be performed as noted.  ABI Findings: +---------+------------------+-----+---------+--------+  Right     Rt Pressure (mmHg) Index Waveform  Comment   +---------+------------------+-----+---------+--------+  Brachial  129                                          +---------+------------------+-----+---------+--------+  PTA       143                1.09  triphasic           +---------+------------------+-----+---------+--------+  PERO      152                      triphasic 1.16      +---------+------------------+-----+---------+--------+  DP        146  1.11  triphasic           +---------+------------------+-----+---------+--------+  Great Toe 133                1.02  Normal              +---------+------------------+-----+---------+--------+ +---------+------------------+-----+---------+---------+  Left      Lt Pressure (mmHg) Index Waveform  Comment    +---------+------------------+-----+---------+---------+  Brachial  131                                           +---------+------------------+-----+---------+---------+  PTA                                absent    inaudible  +---------+------------------+-----+---------+---------+  PERO      141                      triphasic 1.08       +---------+------------------+-----+---------+---------+  DP        130                0.99  triphasic            +---------+------------------+-----+---------+---------+  Great Toe 99                  0.76  Normal               +---------+------------------+-----+---------+---------+ +-------+-----------+-----------+------------+------------+  ABI/TBI Today's ABI Today's TBI Previous ABI Previous TBI  +-------+-----------+-----------+------------+------------+  Right   1.16        1.02        1.05         .81           +-------+-----------+-----------+------------+------------+  Left    1.08        .76         1.05         .62           +-------+-----------+-----------+------------+------------+  Bilateral ABIs and right TBIs appear essentially unchanged compared to prior study on 02/02/2020. Left TBIs appear increased compared to prior study on 02/02/2020.  Summary: Right: Resting right ankle-brachial index is within normal range. No evidence of significant right lower extremity arterial disease. The right toe-brachial index is normal. Left: Resting left ankle-brachial index is within normal range. No evidence of significant left lower extremity arterial disease. The left toe-brachial index is normal.  *See table(s) above for measurements and observations. See LE Arterial duplex report. Suggest follow up study in 12 months. Electronically signed by Quay Burow MD on 02/01/2021 at 6:17:26 PM.    Final    DG Hip Unilat W or Wo Pelvis 2-3 Views Left  Result Date: 02/05/2021 CLINICAL DATA:  Hip pain EXAM: DG HIP (WITH OR WITHOUT PELVIS) 2-3V LEFT COMPARISON:  Bone scan 01/03/2021 FINDINGS: SI joints are non widened. Pubic symphysis and rami appear intact. 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 No fracture or malalignment. IMPRESSION: 1. No acute osseous abnormality. 2. 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   On: 02/05/2021 15:23   DG Hip Unilat W or Wo Pelvis 2-3 Views Right  Result Date: 02/05/2021 CLINICAL DATA:  Hip pain EXAM: DG HIP (WITH OR WITHOUT PELVIS) 2-3V  RIGHT  COMPARISON:  Bone scan 01/03/2021 FINDINGS: SI joints are non widened. Pubic symphysis is intact. No fracture or malalignment. Heterogeneous sclerosis within the femurs and pelvic bones consistent with history of skeletal osseous metastatic disease IMPRESSION: 1. No acute osseous abnormality 2. Heterogeneous sclerosis within the pelvic bones and femurs consistent with history of skeletal metastatic disease Electronically Signed   By: Donavan Foil M.D.   On: 02/05/2021 15:22   VAS [place] LOWER EXTREMITY ARTERIAL DUPLEX  Result Date: 02/01/2021 LOWER EXTREMITY ARTERIAL DUPLEX STUDY Patient Name:  Leslie [name]  Date of Exam:   02/01/2021 Medical Rec #: [id number]        Accession #:    [id number] Date of Birth: 10-03-1945         Patient Gender: F Patient Age:   2 years Exam Location:  [facility] Procedure:      VAS [place] LOWER EXTREMITY ARTERIAL DUPLEX Referring Phys: Roderic Palau BERRY --------------------------------------------------------------------------------  Indications: Peripheral artery disease, and left SFA intervention. Patient does              not report any claudication symptoms or rest pain. She does report              bilateral leg pain but cancer has come back and now its in her              bones and she believes the pain is coming from that as well as the              medication she is currently taking for it. High Risk Factors: Hypertension, hyperlipidemia, current smoker.  Vascular Interventions: Successful chocolate balloon angioplasty followed by                         drug-eluting balloon angioplasty of a high-grade mid                         left SFA stenosis with an excellent angiographic result                         on 06/19/2017. Current ABI:            1.16 on the right and 1.08 on the left. Comparison Study: In 01/2020, a lower arterial duplex showed 30-49% stenosis in                   the left proximal CFA. Patent left mid SFA without evidence of                   restenosis, s/p  angioplasty. Performing Technologist: Sharlett Iles RVT  Examination Guidelines: A complete evaluation includes B-mode imaging, spectral Doppler, color Doppler, and power Doppler as needed of all accessible portions of each vessel. Bilateral testing is considered an integral part of a complete examination. Limited examinations for reoccurring indications may be performed as noted.   +----------+--------+-----+---------------+-----------+-------------+  LEFT       PSV cm/s Ratio Stenosis        Waveform    Comments       +----------+--------+-----+---------------+-----------+-------------+  CFA Prox   214            50-74% stenosis triphasic   low end range  +----------+--------+-----+---------------+-----------+-------------+  CFA Mid    191  triphasic                  +----------+--------+-----+---------------+-----------+-------------+  CFA Distal 148                            triphasic                  +----------+--------+-----+---------------+-----------+-------------+  DFA        283                            triphasic   turbulent      +----------+--------+-----+---------------+-----------+-------------+  SFA Prox   155                            biphasic                   +----------+--------+-----+---------------+-----------+-------------+  SFA Mid    118                            triphasic                  +----------+--------+-----+---------------+-----------+-------------+  SFA Distal 166                            multiphasic                +----------+--------+-----+---------------+-----------+-------------+  POP Prox   144                            triphasic                  +----------+--------+-----+---------------+-----------+-------------+  POP Mid    145                            triphasic                  +----------+--------+-----+---------------+-----------+-------------+  POP Distal 153                            biphasic                    +----------+--------+-----+---------------+-----------+-------------+  TP Trunk   108                            triphasic                  +----------+--------+-----+---------------+-----------+-------------+  Summary: Left: No significant change as compared to previous study. Heterogeneous plaque throughout. 50-74% stenosis in the proximal CFA, low end range. Patent mid SFA without evidence of restenosis, s/p angioplasty.  See table(s) above for measurements and observations. See ABI report. Suggest follow up study in 12 months. Electronically signed by Quay Burow MD on 02/01/2021 at 6:17:09 PM.    Final        IMPRESSION/PLAN: 1. 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 Dr. Lisbeth Renshaw has evaluated and reviewed the patient's imaging.  Dr. Mickeal Skinner and neuro oncology has also been reviewing her case.  Recommendations have been for additional imaging of the  neural axis including C-spine MRI and brain MRI.  Further treatment related decisions would be made from this information.  She appears to be a good candidate however for a palliative modality of therapy with radiation.  This could include a focal treatment to the thoracic level of the cord versus entire brain and spine treatment.  Her case will be reviewed in brain and spine oncology conference on Monday morning including the discussion of additional imaging of her brain and cervical spine.  I will share with her the recommendations from our discussion we did briefly discussed the rationale of radiotherapy as well as the delivery and logistics, short and long-term effects of treatment and she is interested in proceeding with simulation which we we will anticipate Monday at 11 AM.   In a visit lasting 75 minutes, greater than 50% of the time was spent face to face discussing the patient's condition, in preparation for the discussion, and coordinating the patient's care.       Carola Rhine, [facility]   **Disclaimer: This note was dictated with voice recognition software. Similar sounding words can inadvertently be transcribed and this note may contain transcription errors which may not have been corrected upon publication of note.**

## 2021-02-08 NOTE — Final Progress Note (Addendum)
Oncology Discharge Planning Admission Note  [facility] at [facility] Address: [address], [place], [place] [address] Hours of Operation:  8am - 5pm, Monday - Friday  Clinic Contact Information:  [phone]  Oncology Care Team: Medical Oncologist:  Dr. Marisue Ivan, NP is aware of this hospital admission dated 02/07/21, and the cancer center will follow Leslie [name] inpatient care to assist with discharge planning as indicated by the oncologist.  We will reach out closer to discharge date to arrange follow up care.  Disclaimer:  This [place] note does not imply a formal consult request has been made by the admitting attending for this admission or there will be an inpatient consult completed by oncology.  Please request oncology consults as per standard process as indicated.

## 2021-02-09 ENCOUNTER — Inpatient Hospital Stay (HOSPITAL_COMMUNITY): Payer: Medicare Other

## 2021-02-09 DIAGNOSIS — E782 Mixed hyperlipidemia: Secondary | ICD-10-CM | POA: Diagnosis not present

## 2021-02-09 DIAGNOSIS — R338 Other retention of urine: Secondary | ICD-10-CM | POA: Diagnosis not present

## 2021-02-09 DIAGNOSIS — G834 Cauda equina syndrome: Secondary | ICD-10-CM | POA: Diagnosis not present

## 2021-02-09 DIAGNOSIS — I10 Essential (primary) hypertension: Secondary | ICD-10-CM | POA: Diagnosis not present

## 2021-02-09 LAB — CBC WITH DIFFERENTIAL/PLATELET
Abs Immature Granulocytes: 0.13 10*3/uL — ABNORMAL HIGH (ref 0.00–0.07)
Basophils Absolute: 0 10*3/uL (ref 0.0–0.1)
Basophils Relative: 0 %
Eosinophils Absolute: 0 10*3/uL (ref 0.0–0.5)
Eosinophils Relative: 0 %
HCT: 23.1 % — ABNORMAL LOW (ref 36.0–46.0)
Hemoglobin: 7 g/dL — ABNORMAL LOW (ref 12.0–15.0)
Immature Granulocytes: 2 %
Lymphocytes Relative: 20 %
Lymphs Abs: 1.7 10*3/uL (ref 0.7–4.0)
MCH: 34.1 pg — ABNORMAL HIGH (ref 26.0–34.0)
MCHC: 30.3 g/dL (ref 30.0–36.0)
MCV: 112.7 fL — ABNORMAL HIGH (ref 80.0–100.0)
Monocytes Absolute: 0.6 10*3/uL (ref 0.1–1.0)
Monocytes Relative: 7 %
Neutro Abs: 6 10*3/uL (ref 1.7–7.7)
Neutrophils Relative %: 71 %
Platelets: 321 10*3/uL (ref 150–400)
RBC: 2.05 MIL/uL — ABNORMAL LOW (ref 3.87–5.11)
RDW: 18.8 % — ABNORMAL HIGH (ref 11.5–15.5)
WBC: 8.5 10*3/uL (ref 4.0–10.5)
nRBC: 1.9 % — ABNORMAL HIGH (ref 0.0–0.2)

## 2021-02-09 LAB — CBC
HCT: 30.4 % — ABNORMAL LOW (ref 36.0–46.0)
Hemoglobin: 9.7 g/dL — ABNORMAL LOW (ref 12.0–15.0)
MCH: 33.4 pg (ref 26.0–34.0)
MCHC: 31.9 g/dL (ref 30.0–36.0)
MCV: 104.8 fL — ABNORMAL HIGH (ref 80.0–100.0)
Platelets: 360 10*3/uL (ref 150–400)
RBC: 2.9 MIL/uL — ABNORMAL LOW (ref 3.87–5.11)
RDW: 22.5 % — ABNORMAL HIGH (ref 11.5–15.5)
WBC: 10.3 10*3/uL (ref 4.0–10.5)
nRBC: 3.4 % — ABNORMAL HIGH (ref 0.0–0.2)

## 2021-02-09 LAB — COMPREHENSIVE METABOLIC PANEL
ALT: 43 U/L (ref 0–44)
AST: 42 U/L — ABNORMAL HIGH (ref 15–41)
Albumin: 2.7 g/dL — ABNORMAL LOW (ref 3.5–5.0)
Alkaline Phosphatase: 137 U/L — ABNORMAL HIGH (ref 38–126)
Anion gap: 8 (ref 5–15)
BUN: 17 mg/dL (ref 8–23)
CO2: 23 mmol/L (ref 22–32)
Calcium: 8.5 mg/dL — ABNORMAL LOW (ref 8.9–10.3)
Chloride: 104 mmol/L (ref 98–111)
Creatinine, Ser: 0.55 mg/dL (ref 0.44–1.00)
GFR, Estimated: >60 mL/min (ref >60)
Glucose, Bld: 200 mg/dL — ABNORMAL HIGH (ref 70–99)
Potassium: 4.5 mmol/L (ref 3.5–5.1)
Sodium: 135 mmol/L (ref 135–145)
Total Bilirubin: 0.3 mg/dL (ref 0.3–1.2)
Total Protein: 6.6 g/dL (ref 6.5–8.1)

## 2021-02-09 LAB — CYTOLOGY - NON PAP

## 2021-02-09 LAB — ABO/RH: ABO/RH(D): B POS

## 2021-02-09 LAB — GLUCOSE, CAPILLARY: Glucose-Capillary: 168 mg/dL — ABNORMAL HIGH (ref 70–99)

## 2021-02-09 LAB — MAGNESIUM: Magnesium: 2.3 mg/dL (ref 1.7–2.4)

## 2021-02-09 LAB — PREPARE RBC (CROSSMATCH)

## 2021-02-09 MED ORDER — POLYETHYLENE GLYCOL 3350 17 G PO PACK
17.0000 g | PACK | Freq: Two times a day (BID) | ORAL | Status: DC
  Administered 2021-02-09 – 2021-02-10 (×2): 17 g via ORAL
  Filled 2021-02-09 (×2): qty 1

## 2021-02-09 MED ORDER — SENNOSIDES-DOCUSATE SODIUM 8.6-50 MG PO TABS
1.0000 | ORAL_TABLET | Freq: Two times a day (BID) | ORAL | Status: DC
  Administered 2021-02-09 – 2021-02-10 (×3): 1 via ORAL
  Filled 2021-02-09 (×3): qty 1

## 2021-02-09 MED ORDER — DIPHENHYDRAMINE HCL 25 MG PO CAPS
25.0000 mg | ORAL_CAPSULE | Freq: Once | ORAL | Status: AC
  Administered 2021-02-09: 25 mg via ORAL
  Filled 2021-02-09: qty 1

## 2021-02-09 MED ORDER — SODIUM CHLORIDE 0.9% IV SOLUTION: Freq: Once | INTRAVENOUS | Status: AC

## 2021-02-09 MED ORDER — FUROSEMIDE 10 MG/ML IJ SOLN
20.0000 mg | Freq: Once | INTRAMUSCULAR | Status: AC
  Administered 2021-02-09: 20 mg via INTRAVENOUS
  Filled 2021-02-09: qty 2

## 2021-02-09 MED ORDER — GADOBUTROL 1 MMOL/ML IV SOLN
5.0000 mL | Freq: Once | INTRAVENOUS | Status: AC | PRN
  Administered 2021-02-09: 5 mL via INTRAVENOUS

## 2021-02-09 MED ORDER — ACETAMINOPHEN 325 MG PO TABS
650.0000 mg | ORAL_TABLET | Freq: Once | ORAL | Status: AC
  Administered 2021-02-09: 650 mg via ORAL
  Filled 2021-02-09: qty 2

## 2021-02-09 NOTE — Progress Notes (Signed)
PT Cancellation Note  Patient Details Name: KAMLA SKILTON MRN: [id number] DOB: 04/21/1945   Cancelled Treatment:    Reason Eval/Treat Not Completed: Patient at procedure or test/unavailable (pt going for MRI. will follow up at later date/time as schedule allows.)   Gwynneth Albright PT, DPT Acute Rehabilitation Services Office [phone] Pager [phone]

## 2021-02-09 NOTE — TOC Progression Note (Signed)
Transition of Care [facility]) - Progression Note   Patient Details  Name: Leslie [name] MRN: [id number] Date of Birth: Mar 14, 1945  Transition of Care [facility]) CM/SW [place], LCSW Phone Number: 02/09/2021, 12:34 PM  Clinical Narrative: Order has been placed for rolling walker. CSW made DME referral to [place] with [employer]. [employer] to deliver walker to patient's room. CSW updated patient.  Expected Discharge Plan: [place] Barriers to Discharge: Continued Medical Work up  Expected Discharge Plan and Services Expected Discharge Plan: [place] In-house Referral: Clinical Social Work Post Acute Care Choice: Durable Medical Equipment, Home Health Living arrangements for the past 2 months: Single Family Home            DME Arranged: Walker rolling DME Agency: [employer] Date DME Agency Contacted: 02/09/21 Time DME Agency Contacted: 1212 Representative spoke with at DME Agency: [place]: PT, OT [place] Agency: [place] ([place]) Date [place]: 02/08/21 Representative spoke with at [place]: Corene Cornea  Readmission Risk Interventions No flowsheet data found.

## 2021-02-09 NOTE — Progress Notes (Signed)
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 Jana Hakim who has retired, hypertension, hyperlipidemia presented to the ED with urinary retention for the past 2 to 3 days, some lower back pain with radiation shooting into the legs, 3 to 4-week history of saddle anesthesia.  Foley catheter placed with good urine output.  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  Patient seen in consultation by neurosurgery, Dr. Ronnald Ramp who recommended lumbar puncture, no surgical intervention at this time, further evaluation by oncology and radiation oncology.   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 -MRI of the L and T-spine concerning for multiple epidural tumors/disease in the lower T-spine with moderate stenosis at T10.  Findings concerning for mild cauda equina. -Patient seen in consultation by neurosurgery, Dr. 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  Concerned that patient may also have some spinal stenosis in the lumbar region causing the claudication-like symptoms, bladder dysfunction may be more related to edema within the conus down from compression at T10 per neurosurgery. -Neurosurgery recommended high-volume lumbar puncture for cytology to rule out leptomeningeal disease, oncology, radiation oncology consultations. -IR consulted for lumbar puncture which was done under fluoroscopy 02/08/2021.  LP studies with cytology pending, no organisms noted.  -Head CT with no acute abnormalities. 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  Per neuro oncology patient will be presented at brain/spine tumor board on 02/12/2021, feel patient may benefit from palliative radiation but limited options and recommending palliative care consultation which has been placed. -Patient also seen by medical oncology and CT chest abdomen and pelvis done for restaging purposes, MRI head and MRI C-spine ordered and pending. -Continue IV Decadron. -Radiation oncology following and assessing for palliative radiation and patient for simulation on Monday, 02/12/2021. -Neuro oncology/medical oncology/radiation oncology/neurosurgery following and appreciate input and recommendations.  2.  Urinary retention -Likely secondary to problem #1. -Foley catheter placed in the ED after bladder scan showed 700 cc of urinary retention with good urine output. -See #1. -Urine output of 2.050 L over the past 24 hours. -Likely be discharged home with Foley catheter in place. -May need outpatient follow-up with urology.  3.  Hypokalemia -Repleted.  Potassium of 4.5.   4.  Metastatic breast  cancer -Continue home regimen of Arimidex.   -Medical oncology consulted and following.   5.  Hypertension -Patient states was on antihypertensive medications but PCP discontinued them per patient. -Blood pressure stable. -Follow.  6.  Hyperlipidemia -Stated was on a statin however held per her oncology recommendations. -Continue to hold statin. -Outpatient follow-up.  7.  Transaminitis -Likely secondary to metastatic disease. -Follow.  8.  Anemia of chronic disease -Patient denies any overt bleeding. -Hemoglobin currently at 7.0 from 8.3 (02/05/2021). -Transfuse 1 unit packed red blood cells. -Follow H&H.       DVT prophylaxis: SCDs Code Status: DNR Family Communication: Updated patient.  No family at bedside. Disposition:   Status is: Inpatient  Remains inpatient appropriate because: Severity of illness       Consultants:  Neuro oncology 02/08/2021: Dr. Mickeal Skinner Medical oncology: Dr.Iruku 02/08/2021 Neurosurgery: Dr. Ronnald Ramp 02/07/2021 Radiation oncology: Dr. Henreitta Cea, PA 02/08/2021  Procedures:  CT head 02/08/2021 Chest x-ray 02/07/2021 MRI of the T and L-spine 02/07/2021 Lumbar puncture on the fluoroscopic guidance by IR: Dr. Earma Reading 02/08/2021 Transfusion 1 unit packed red blood cells pending 02/09/2021 MRI head MRI C-spine pending 02/09/2021     Antimicrobials:  None   Subjective: Laying in bed.  No chest pain.  No shortness of breath.  Stated had some tingling in her feet prior to admission which is slowly improving.  Able to turn a little bit better.  Denies any overt GI bleed.  Tolerating current diet.   Objective: Vitals:   02/09/21 0009 02/09/21 0400 02/09/21 0736 02/09/21 0800  BP:  (!) 108/49  (!) 131/57  Pulse:      Resp:  16  18  Temp: 97.9 F (36.6 C) 97.7 F (36.5 C) 97.6 F (36.4 C)   TempSrc: Oral Oral Oral   SpO2:  99%  92%  Weight:  47.8 kg      Intake/Output Summary (Last 24 hours) at 02/09/2021 0958 Last data filed at  02/09/2021 0600 Gross per 24 hour  Intake 1320 ml  Output 2050 ml  Net -730 ml   Filed Weights   02/08/21 0500 02/09/21 0400  Weight: 45.5 kg 47.8 kg    Examination:  General exam: NAD Respiratory system: Lungs clear to auscultation bilaterally.  No wheezes, no crackles, no rhonchi.  Decreased breath sounds in the bases.  Fair air movement.  Normal respiratory effort.   Cardiovascular system: Regular rate rhythm no murmurs rubs or gallops.  No JVD.  No lower extremity edema.  Gastrointestinal system: Abdomen is soft, nontender, nondistended, positive bowel sounds.  No rebound.  No guarding.  Central nervous system: Alert and oriented. No focal neurological deficits. Extremities: Symmetric 5 x 5 power. Skin: No rashes, lesions or ulcers Psychiatry: Judgement and insight appear normal. Mood & affect appropriate.     Data Reviewed: I have personally reviewed following labs and imaging studies  CBC: Recent Labs  Lab 02/05/21 1405 02/07/21 1341 02/08/21 0237 02/09/21 0231  WBC 10.3 8.7 8.1 8.5  NEUTROABS 5.9 4.4 5.2 6.0  HGB 8.3* 7.6* 7.4* 7.0*  HCT 26.3* 24.8* 24.2* 23.1*  MCV 112.4* 112.2* 115.2* 112.7*  PLT 458* 401* 317 321     Basic Metabolic Panel: Recent Labs  Lab 02/05/21 1405 02/07/21 1234 02/07/21 1239 02/07/21 1341 02/08/21 0237 02/09/21 0231  NA 135  --   --  136 136 135  K 4.0  --   --  3.4* 4.4 4.5  CL 100  --   --  102 102 104  CO2 25  --   --  26 24 23   GLUCOSE 100*  --   --  113* 152* 200*  BUN 11  --   --  14 12 17   CREATININE 0.83 0.90 0.80 0.75 0.58 0.55  CALCIUM 9.1  --   --  9.2 9.0 8.5*  MG  --   --   --   --  2.2 2.3     GFR: Estimated Creatinine Clearance: 46 mL/min (by C-G formula based on SCr of 0.55 mg/dL).  Liver Function Tests: Recent Labs  Lab 02/07/21 1341 02/08/21 0237 02/09/21 0231  AST 63* 56* 42*  ALT 51* 48* 43  ALKPHOS 153* 159* 137*  BILITOT 0.6 0.5 0.3  PROT 7.7 7.1 6.6  ALBUMIN 3.2* 2.8* 2.7*      CBG: Recent Labs  Lab 02/08/21 0805 02/09/21 0750  GLUCAP 146* 168*      Recent Results (from the past 240 hour(s))  Resp Panel by RT-PCR (Flu A&B, Covid) Nasopharyngeal Swab     Status: None   Collection Time: 02/07/21  1:57 PM   Specimen: Nasopharyngeal Swab; Nasopharyngeal(NP) swabs in vial transport medium  Result Value Ref Range Status   SARS Coronavirus 2 by RT PCR NEGATIVE NEGATIVE Final    Comment: (NOTE) SARS-CoV-2 target nucleic acids are NOT DETECTED.  The SARS-CoV-2 RNA is generally detectable in upper respiratory specimens during the acute phase of infection. The lowest concentration of SARS-CoV-2 viral copies this assay can detect is 138 copies/mL. A negative result does not preclude SARS-Cov-2 infection and should not be used as the sole basis for treatment or other patient management decisions. A negative result may occur with  improper specimen collection/handling, submission of specimen other than nasopharyngeal swab, presence of viral mutation(s) within the areas targeted by this assay, and inadequate number of viral copies(<138 copies/mL). A negative result must be combined with clinical observations, patient history, and epidemiological information. The expected result is Negative.  Fact Sheet for Patients:  EntrepreneurPulse.com.au  Fact Sheet for Healthcare Providers:  IncredibleEmployment.be  This test is no t yet approved or cleared by the [place] FDA and  has been authorized for detection and/or diagnosis of SARS-CoV-2 by FDA under an Emergency Use Authorization (EUA). This EUA will remain  in effect (meaning this test can be used) for the duration of the COVID-19 declaration under Section 564(b)(1) of the Act, 21 U.S.C.section 360bbb-3(b)(1), unless the authorization is terminated  or revoked sooner.       Influenza A by PCR NEGATIVE NEGATIVE Final   Influenza B by PCR NEGATIVE NEGATIVE Final     Comment: (NOTE) The Xpert Xpress SARS-CoV-2/FLU/RSV plus assay is intended as an aid in the diagnosis of influenza from Nasopharyngeal swab specimens and should not be used as a sole basis for treatment. Nasal washings and aspirates are unacceptable for Xpert Xpress SARS-CoV-2/FLU/RSV testing.  Fact Sheet for Patients: EntrepreneurPulse.com.au  Fact Sheet for Healthcare Providers: IncredibleEmployment.be  This test is not yet approved or cleared by the [place] FDA and has been authorized for detection and/or diagnosis of SARS-CoV-2 by FDA under an Emergency Use Authorization (EUA). This EUA will remain in effect (meaning this test can be used) for the duration of the COVID-19 declaration under Section 564(b)(1) of the Act, 21 U.S.C. section 360bbb-3(b)(1), unless the authorization is terminated or revoked.  Performed at [facility] [place] [address] [place], [place] [address]   MRSA Next Gen by PCR, Nasal     Status: None   Collection Time: 02/07/21 10:35 PM   Specimen: Nasal Mucosa; Nasal Swab  Result Value  Ref Range Status   MRSA by PCR Next Gen NOT DETECTED NOT DETECTED Final    Comment: (NOTE) The GeneXpert MRSA Assay (FDA approved for NASAL specimens only), is one component of a comprehensive MRSA colonization surveillance program. It is not intended to diagnose MRSA infection nor to guide or monitor treatment for MRSA infections. Test performance is not FDA approved in patients less than 44 years old. Performed at [facility], [place] [address], [place], [place] [address]   CSF culture w Gram Stain     Status: None (Preliminary result)   Collection Time: 02/08/21  1:20 PM   Specimen: PATH Cytology CSF; Cerebrospinal Fluid  Result Value Ref Range Status   Specimen Description   Final    CSF Performed at [place] [address] [place], [place] [address]     Special Requests   Final    NONE Performed at [facility], [place] [address] [place], [place] [address]    Gram Stain   Final    WBC PRESENT,BOTH PMN AND MONONUCLEAR NO ORGANISMS SEEN Gram Stain Report Called to,Read Back By and Verified With: R.PULEO, RN AT 5102 ON 01.19.23 BY N.Trinty Marken CYTOSPIN SMEAR Performed at [place] [address], [place], [place] [address]    Culture   Final    NO GROWTH < 12 HOURS Performed at [place] [address], [place],  [address]    Report Status PENDING  Incomplete          Radiology Studies: CT HEAD WO CONTRAST (5MM)  Result Date: 02/08/2021 CLINICAL DATA:  Study prior to lumbar puncture EXAM: CT HEAD WITHOUT CONTRAST TECHNIQUE: Contiguous axial images were obtained from the base of the skull through the vertex without intravenous contrast. RADIATION DOSE REDUCTION: This exam was performed according to the departmental dose-optimization program which includes automated exposure control, adjustment of the mA and/or kV according to patient size and/or use of iterative reconstruction technique. COMPARISON:  MR brain done on 09/13/2009 FINDINGS: Brain: No acute intracranial findings are seen. Ventricles are not dilated. Cortical sulci are prominent. There are no epidural or subdural fluid collections. There is decreased density in the periventricular white matter. Vascular: Unremarkable. Skull: Unremarkable. Sinuses/Orbits: Visualized paranasal sinuses are unremarkable. There is fluid in the right mastoid air cells. There is no break in the cortical margins in the right mastoid. Other: None IMPRESSION: No acute intracranial findings are seen. Atrophy. Small-vessel disease. There is fluid density in the right mastoid air cells suggesting right mastoid effusion or chronic right mastoiditis. Electronically Signed   By: Elmer Picker M.D.   On: 02/08/2021 12:43   CT CHEST W CONTRAST  Result  Date: 02/08/2021 CLINICAL DATA:  Restaging metastatic breast cancer. EXAM: CT CHEST, ABDOMEN, AND PELVIS WITH CONTRAST TECHNIQUE: Multidetector CT imaging of the chest, abdomen and pelvis was performed following the standard protocol during bolus administration of intravenous contrast. RADIATION DOSE REDUCTION: This exam was performed according to the departmental dose-optimization program which includes automated exposure control, adjustment of the mA and/or kV according to patient size and/or use of iterative reconstruction technique. CONTRAST:  42mL OMNIPAQUE IOHEXOL 300 MG/ML  SOLN COMPARISON:  Chest CT 01/03/2021. Abdominal/pelvic CT scan 08/14/2018 FINDINGS: CT CHEST FINDINGS Cardiovascular: The heart is normal in size. No pericardial effusion. The aorta is normal in caliber. No dissection. Stable aortic and coronary artery calcifications. Mediastinum/Nodes: No mediastinal or hilar mass or lymphadenopathy. The esophagus is grossly normal. Lungs/Pleura:  Stable emphysematous changes and areas of pulmonary scarring. Small scattered perifissural nodules consistent with small lymph nodes. No worrisome pulmonary lesions to suggest pulmonary metastatic disease. There are bilateral pleural effusions, left larger than right with overlying atelectasis left greater than right. No obvious enhancing pleural nodules. Musculoskeletal: Overall stable appearing severe diffuse metastatic bone disease with mixed lytic and sclerotic changes and old pathologic fractures. I do not see any obvious new or progressive findings. The left paraspinal muscles are thickened and demonstrate heterogeneous enhancement findings suspicious for tumor involvement. CT ABDOMEN PELVIS FINDINGS Hepatobiliary: No hepatic lesions to suggest metastatic disease. The gallbladder is unremarkable. No intra or extrahepatic biliary dilatation. Pancreas: No mass, inflammation or ductal dilatation. Spleen: Normal size.  No focal lesions. Adrenals/Urinary Tract:  The adrenal glands and kidneys are unremarkable and stable. Small renal cysts are noted. No worrisome renal lesions. The bladder is decompressed by a Foley catheter. Stomach/Bowel: Stomach, duodenum, small bowel and colon are grossly normal. Vascular/Lymphatic: Stable advanced atherosclerotic calcifications involving the aorta and iliac arteries but no aneurysm. Stable small scattered mesenteric and retroperitoneal lymph nodes but no mass or adenopathy. No findings suspicious for peritoneal carcinomatosis. Reproductive: Surgically absent. Other: No free air or free fluid. 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 There is a large lytic lesion involving the right acetabulum but I do not see a definite pathologic fracture. Lytic lesion involving the upper right iliac bone with destruction of the medial cortex. No obvious pathologic hip fracture. Large enhancing masslike area in the left gluteus medius muscle worrisome for a muscle involvement, likely from direct extension. Other areas of abnormal muscle enhancement could suggest muscle lesions. IMPRESSION: 1. 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 2. Several areas of masslike enhancement in the muscles of the spine and pelvis worrisome for tumor involvement. 3. No findings suspicious for pulmonary metastatic disease. 4. Bilateral pleural effusions, left larger than right with overlying atelectasis left greater than right. 5. No findings suspicious for abdominal/pelvic metastatic disease. 6. Stable advanced atherosclerotic calcifications involving the thoracic and abdominal aorta and iliac arteries. Aortic Atherosclerosis (ICD10-I70.0) and Emphysema (ICD10-J43.9). Electronically Signed   By: Marijo Sanes M.D.   On: 02/08/2021 19:23   MR THORACIC SPINE W WO CONTRAST  Result Date: 02/07/2021 CLINICAL  DATA:  Back pain. Difficulty with urination. History of breast cancer with widespread osseous metastatic disease. EXAM: MRI THORACIC AND LUMBAR SPINE WITHOUT AND WITH CONTRAST TECHNIQUE: Multiplanar and multiecho pulse sequences of the thoracic and lumbar spine were obtained without and with intravenous contrast. CONTRAST:  29mL GADAVIST GADOBUTROL 1 MMOL/ML IV SOLN COMPARISON:  Lumbar spine radiographs 02/05/2021. Nuclear medicine whole-body bone scan 01/03/2021. Chest CT 01/03/2021. Lumbar spine MRI 10/04/2019. FINDINGS: MRI THORACIC SPINE FINDINGS The study is moderately motion degraded, greatest on axial sequences. Alignment: Mild lower thoracic dextroscoliosis. No significant listhesis. Vertebrae: Known widespread metastatic disease throughout the vertebral bodies and posterior elements at every thoracic level as well as in the included cervical spine. Pathologic T4 compression fracture with mild vertebral body height loss, similar to the prior chest CT. A T9 superior endplate Schmorl's node is unchanged. There is evidence of epidural tumor in the lower thoracic spine, greatest at T10-11 where there is resultant moderate spinal stenosis. The craniocaudal extent of epidural tumor is difficult to precisely determine although it may extend cranially to approximately T7-8. Cord: Abnormal enhancement along the  surface of the spinal cord at least throughout the mid and upper thoracic spine with extension towards the cervical region. Abnormal T2 hyperintensity centrally in the upper and, to a lesser extent, mid thoracic spinal cord which could reflect edema or a syrinx extending towards the cervical region and measuring up to approximately 4 mm in diameter near the cervicothoracic junction. Additional edema in the lower thoracic spinal cord. Paraspinal and other soft tissues: Small left pleural effusion, larger than on the prior chest CT. Widespread STIR hyperintensity and enhancement of the left greater than right  posterior paraspinal musculature. Disc levels: Disc degeneration greatest at T10-11 and T11-12 where there is asymmetrically severe left-sided disc space narrowing. Up to moderate lower thoracic spinal stenosis due to epidural tumor. MRI LUMBAR SPINE FINDINGS Segmentation:  Standard. Alignment: Moderate lumbar levoscoliosis. No significant listhesis. Vertebrae: Chronic L4 Schmorl's nodes and mild chronic L3 compression fracture. Known widespread osseous metastatic disease throughout the lumbar spine and included pelvis. Epidural tumor at the thoracolumbar junction as noted above on the thoracic spine MRI. Mildly prominent epidural enhancement throughout the lumbar spine more caudally, indeterminate for epidural tumor but without a dominant epidural mass in the lumbar spine. Epidural tumor is suspected in the sacrum involving left-sided neural foramina. Prominent perineural cysts at multiple levels as previously seen. Conus medullaris: Extends to the T12-L1 level and demonstrates edema as noted above. There is abnormal nodular enhancement along the surface of the distal spinal cord/conus, and there also appears to be mild enhancement of cauda equina nerve roots in the lower lumbar and sacral canal. Paraspinal and other soft tissues: Chronic asymmetric left psoas and left greater than right posterior paraspinal muscle atrophy. Partially visualized extensive gluteal musculature enhancement bilaterally. Small right renal cyst. Disc levels: L1-2: Disc bulging and mild facet and ligamentum flavum hypertrophy result in moderate right lateral recess stenosis and moderate right and mild left neural foraminal stenosis, similar to the prior MRI. Borderline to mild spinal stenosis. L2-3: Disc bulging and moderate facet and ligamentum flavum hypertrophy result in mild right lateral recess stenosis and severe right neural foraminal stenosis, similar to the prior MRI. Borderline spinal stenosis. L3-4: Right eccentric disc bulging  and mild-to-moderate facet and ligamentum flavum hypertrophy result in borderline to mild right lateral recess stenosis and mild right neural foraminal stenosis without spinal stenosis, similar to the prior MRI. L4-5: Minimal disc bulging without stenosis. L5-S1: Negative. IMPRESSION: 1. Known widespread metastatic disease throughout the thoracic and lumbar spine. 2. 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 3. Abnormal enhancement relatively diffusely along the surface of the thoracic spinal cord with suspected mild involvement of the cauda equina nerve roots compatible with left meningeal disease. 4. Edema in the distal spinal cord/conus as well as edema and/or syrinx in the upper thoracic cord extending into the cervical region. 5. Widespread edema/enhancement involving the thoracic paraspinal musculature and bilateral gluteal musculature, potentially denervation or rhabdomyolysis. 6. Small left pleural effusion. Electronically Signed   By: Logan Bores M.D.   On: 02/07/2021 16:11   MR Lumbar Spine W Wo Contrast  Result Date: 02/07/2021 CLINICAL DATA:  Back pain. Difficulty with urination. History of breast cancer with widespread osseous metastatic disease. EXAM: MRI THORACIC AND LUMBAR SPINE WITHOUT AND WITH CONTRAST TECHNIQUE: Multiplanar and multiecho pulse sequences of the thoracic and lumbar spine were obtained without and with intravenous contrast. CONTRAST:  40mL GADAVIST GADOBUTROL 1 MMOL/ML IV SOLN COMPARISON:  Lumbar spine radiographs 02/05/2021. Nuclear medicine  whole-body bone scan 01/03/2021. Chest CT 01/03/2021. Lumbar spine MRI 10/04/2019. FINDINGS: MRI THORACIC SPINE FINDINGS The study is moderately motion degraded, greatest on axial sequences. Alignment: Mild lower thoracic dextroscoliosis. No significant listhesis. Vertebrae: Known widespread metastatic disease throughout the vertebral bodies and posterior elements at every thoracic level as  well as in the included cervical spine. Pathologic T4 compression fracture with mild vertebral body height loss, similar to the prior chest CT. A T9 superior endplate Schmorl's node is unchanged. There is evidence of epidural tumor in the lower thoracic spine, greatest at T10-11 where there is resultant moderate spinal stenosis. The craniocaudal extent of epidural tumor is difficult to precisely determine although it may extend cranially to approximately T7-8. Cord: Abnormal enhancement along the surface of the spinal cord at least throughout the mid and upper thoracic spine with extension towards the cervical region. Abnormal T2 hyperintensity centrally in the upper and, to a lesser extent, mid thoracic spinal cord which could reflect edema or a syrinx extending towards the cervical region and measuring up to approximately 4 mm in diameter near the cervicothoracic junction. Additional edema in the lower thoracic spinal cord. Paraspinal and other soft tissues: Small left pleural effusion, larger than on the prior chest CT. Widespread STIR hyperintensity and enhancement of the left greater than right posterior paraspinal musculature. Disc levels: Disc degeneration greatest at T10-11 and T11-12 where there is asymmetrically severe left-sided disc space narrowing. Up to moderate lower thoracic spinal stenosis due to epidural tumor. MRI LUMBAR SPINE FINDINGS Segmentation:  Standard. Alignment: Moderate lumbar levoscoliosis. No significant listhesis. Vertebrae: Chronic L4 Schmorl's nodes and mild chronic L3 compression fracture. Known widespread osseous metastatic disease throughout the lumbar spine and included pelvis. Epidural tumor at the thoracolumbar junction as noted above on the thoracic spine MRI. Mildly prominent epidural enhancement throughout the lumbar spine more caudally, indeterminate for epidural tumor but without a dominant epidural mass in the lumbar spine. Epidural tumor is suspected in the sacrum  involving left-sided neural foramina. Prominent perineural cysts at multiple levels as previously seen. Conus medullaris: Extends to the T12-L1 level and demonstrates edema as noted above. There is abnormal nodular enhancement along the surface of the distal spinal cord/conus, and there also appears to be mild enhancement of cauda equina nerve roots in the lower lumbar and sacral canal. Paraspinal and other soft tissues: Chronic asymmetric left psoas and left greater than right posterior paraspinal muscle atrophy. Partially visualized extensive gluteal musculature enhancement bilaterally. Small right renal cyst. Disc levels: L1-2: Disc bulging and mild facet and ligamentum flavum hypertrophy result in moderate right lateral recess stenosis and moderate right and mild left neural foraminal stenosis, similar to the prior MRI. Borderline to mild spinal stenosis. L2-3: Disc bulging and moderate facet and ligamentum flavum hypertrophy result in mild right lateral recess stenosis and severe right neural foraminal stenosis, similar to the prior MRI. Borderline spinal stenosis. L3-4: Right eccentric disc bulging and mild-to-moderate facet and ligamentum flavum hypertrophy result in borderline to mild right lateral recess stenosis and mild right neural foraminal stenosis without spinal stenosis, similar to the prior MRI. L4-5: Minimal disc bulging without stenosis. L5-S1: Negative. IMPRESSION: 1. Known widespread metastatic disease throughout the thoracic and lumbar spine. 2. 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 3. Abnormal enhancement relatively diffusely along the surface of the thoracic spinal cord with suspected mild involvement of the cauda equina nerve roots compatible with left meningeal disease. 4. Edema in the distal spinal  cord/conus as well as edema and/or syrinx in the upper thoracic cord extending into the cervical region. 5. Widespread edema/enhancement  involving the thoracic paraspinal musculature and bilateral gluteal musculature, potentially denervation or rhabdomyolysis. 6. Small left pleural effusion. Electronically Signed   By: Logan Bores M.D.   On: 02/07/2021 16:11   CT ABDOMEN PELVIS W CONTRAST  Result Date: 02/08/2021 CLINICAL DATA:  Restaging metastatic breast cancer. EXAM: CT CHEST, ABDOMEN, AND PELVIS WITH CONTRAST TECHNIQUE: Multidetector CT imaging of the chest, abdomen and pelvis was performed following the standard protocol during bolus administration of intravenous contrast. RADIATION DOSE REDUCTION: This exam was performed according to the departmental dose-optimization program which includes automated exposure control, adjustment of the mA and/or kV according to patient size and/or use of iterative reconstruction technique. CONTRAST:  15mL OMNIPAQUE IOHEXOL 300 MG/ML  SOLN COMPARISON:  Chest CT 01/03/2021. Abdominal/pelvic CT scan 08/14/2018 FINDINGS: CT CHEST FINDINGS Cardiovascular: The heart is normal in size. No pericardial effusion. The aorta is normal in caliber. No dissection. Stable aortic and coronary artery calcifications. Mediastinum/Nodes: No mediastinal or hilar mass or lymphadenopathy. The esophagus is grossly normal. Lungs/Pleura: Stable emphysematous changes and areas of pulmonary scarring. Small scattered perifissural nodules consistent with small lymph nodes. No worrisome pulmonary lesions to suggest pulmonary metastatic disease. There are bilateral pleural effusions, left larger than right with overlying atelectasis left greater than right. No obvious enhancing pleural nodules. Musculoskeletal: Overall stable appearing severe diffuse metastatic bone disease with mixed lytic and sclerotic changes and old pathologic fractures. I do not see any obvious new or progressive findings. The left paraspinal muscles are thickened and demonstrate heterogeneous enhancement findings suspicious for tumor involvement. CT ABDOMEN PELVIS  FINDINGS Hepatobiliary: No hepatic lesions to suggest metastatic disease. The gallbladder is unremarkable. No intra or extrahepatic biliary dilatation. Pancreas: No mass, inflammation or ductal dilatation. Spleen: Normal size.  No focal lesions. Adrenals/Urinary Tract: The adrenal glands and kidneys are unremarkable and stable. Small renal cysts are noted. No worrisome renal lesions. The bladder is decompressed by a Foley catheter. Stomach/Bowel: Stomach, duodenum, small bowel and colon are grossly normal. Vascular/Lymphatic: Stable advanced atherosclerotic calcifications involving the aorta and iliac arteries but no aneurysm. Stable small scattered mesenteric and retroperitoneal lymph nodes but no mass or adenopathy. No findings suspicious for peritoneal carcinomatosis. Reproductive: Surgically absent. Other: No free air or free fluid. 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 There is a large lytic lesion involving the right acetabulum but I do not see a definite pathologic fracture. Lytic lesion involving the upper right iliac bone with destruction of the medial cortex. No obvious pathologic hip fracture. Large enhancing masslike area in the left gluteus medius muscle worrisome for a muscle involvement, likely from direct extension. Other areas of abnormal muscle enhancement could suggest muscle lesions. IMPRESSION: 1. 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 2. Several areas of masslike enhancement in the muscles of the spine and pelvis worrisome for tumor involvement. 3. No findings suspicious for pulmonary metastatic disease. 4. Bilateral pleural effusions, left larger than right with overlying atelectasis left greater than right. 5. No findings suspicious for abdominal/pelvic metastatic disease. 6. Stable advanced atherosclerotic calcifications  involving the thoracic and abdominal aorta and iliac arteries. Aortic Atherosclerosis (ICD10-I70.0) and Emphysema (ICD10-J43.9). Electronically Signed   By: Marijo Sanes M.D.   On: 02/08/2021 19:23   DG Chest Portable 1 View  Result Date: 02/07/2021 CLINICAL DATA:  Shortness of breath, difficulty urinating EXAM: PORTABLE CHEST 1 VIEW COMPARISON:  10/11/2020 FINDINGS: Mild cardiomegaly. Mild, diffuse bilateral interstitial pulmonary opacity. Small right pleural effusion. Unchanged elevation of the left hemidiaphragm with associated atelectasis or consolidation. The visualized skeletal structures are unremarkable. IMPRESSION: 1. Mild, diffuse bilateral interstitial pulmonary opacity, most consistent with edema. No focal airspace opacity. 2. Small right pleural effusion. 3. Unchanged elevation of the left hemidiaphragm with associated atelectasis or consolidation. Electronically Signed   By: Delanna Ahmadi M.D.   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 EXAM: DIAGNOSTIC LUMBAR PUNCTURE UNDER FLUOROSCOPIC GUIDANCE COMPARISON:  Spine MRI 02/07/2021. Today's head CT, dictated separately. FLUOROSCOPY TIME:  Fluoroscopy Time:  2 minutes and 48 seconds Radiation Exposure Index (if provided by the fluoroscopic device): 14.9 mGy Number of Acquired Spot Images: 0 PROCEDURE: Informed consent was obtained from the patient prior to the procedure, including potential complications of headache, allergy, and pain. With the patient prone, the lower back was prepped with Betadine. 1% Lidocaine was used for local anesthesia. Lumbar puncture was performed at the L4-5 level using a 20 gauge needle with return of clear CSF with an opening pressure of 9 cm water. After obtaining 2 vials each with 2 cc of clear CSF, the return of CSF became sanguinous. 1 cc of sanguinous CSF was obtained. At this point, the tubing was  removed and the stylette placed in order to attempt to clear the CSF flow. Despite multiple maneuvers, CSF return could not be obtained a second time. The patient tolerated the procedure well and there were no apparent complications. IMPRESSION: Successful lumbar puncture, as detailed above. Only a total of 5 cc of CSF were obtained, secondary to the CSF flow becoming sanguinous. Electronically Signed   By: Abigail Miyamoto M.D.   On: 02/08/2021 13:58        Scheduled Meds:  sodium chloride   Intravenous Once   acetaminophen  650 mg Oral Once   anastrozole  1 mg Oral Daily   Chlorhexidine Gluconate Cloth  6 each Topical Daily   cholecalciferol  400 Units Oral Daily   dexamethasone (DECADRON) injection  4 mg Intravenous Q6H   diphenhydrAMINE  25 mg Oral Once   feeding supplement  237 mL Oral BID BM   folic acid  0.5 mg Oral Daily   furosemide  20 mg Intravenous Once   lidocaine  1 patch Transdermal Q24H   mirtazapine  7.5 mg Oral QHS   multivitamin with minerals  1 tablet Oral Daily   polyethylene glycol  17 g Oral BID   senna-docusate  1 tablet Oral BID   vitamin E  400 Units Oral Daily   Continuous Infusions:  sodium chloride 75 mL/hr at 02/09/21 0440     LOS: 2 days    Time spent: 40 minutes    Irine Seal, MD [facility]   To contact the attending provider between 7A-7P or the covering provider during after hours 7P-7A, please log into the web site [email or web address] and access using universal [place] password for that web site. If you do not have the password, please call the hospital operator.  02/09/2021, 9:58 AM

## 2021-02-10 DIAGNOSIS — I10 Essential (primary) hypertension: Secondary | ICD-10-CM | POA: Diagnosis not present

## 2021-02-10 DIAGNOSIS — C799 Secondary malignant neoplasm of unspecified site: Secondary | ICD-10-CM | POA: Diagnosis present

## 2021-02-10 DIAGNOSIS — K5900 Constipation, unspecified: Secondary | ICD-10-CM | POA: Diagnosis present

## 2021-02-10 DIAGNOSIS — G834 Cauda equina syndrome: Secondary | ICD-10-CM | POA: Diagnosis not present

## 2021-02-10 DIAGNOSIS — Z515 Encounter for palliative care: Secondary | ICD-10-CM

## 2021-02-10 DIAGNOSIS — C7951 Secondary malignant neoplasm of bone: Secondary | ICD-10-CM | POA: Diagnosis not present

## 2021-02-10 DIAGNOSIS — R338 Other retention of urine: Secondary | ICD-10-CM | POA: Diagnosis not present

## 2021-02-10 DIAGNOSIS — G96198 Other disorders of meninges, not elsewhere classified: Secondary | ICD-10-CM | POA: Diagnosis present

## 2021-02-10 DIAGNOSIS — C50412 Malignant neoplasm of upper-outer quadrant of left female breast: Secondary | ICD-10-CM | POA: Diagnosis not present

## 2021-02-10 LAB — COMPREHENSIVE METABOLIC PANEL
ALT: 50 U/L — ABNORMAL HIGH (ref 0–44)
AST: 50 U/L — ABNORMAL HIGH (ref 15–41)
Albumin: 2.7 g/dL — ABNORMAL LOW (ref 3.5–5.0)
Alkaline Phosphatase: 144 U/L — ABNORMAL HIGH (ref 38–126)
Anion gap: 7 (ref 5–15)
BUN: 23 mg/dL (ref 8–23)
CO2: 28 mmol/L (ref 22–32)
Calcium: 8.9 mg/dL (ref 8.9–10.3)
Chloride: 101 mmol/L (ref 98–111)
Creatinine, Ser: 0.72 mg/dL (ref 0.44–1.00)
GFR, Estimated: >60 mL/min (ref >60)
Glucose, Bld: 180 mg/dL — ABNORMAL HIGH (ref 70–99)
Potassium: 4.6 mmol/L (ref 3.5–5.1)
Sodium: 136 mmol/L (ref 135–145)
Total Bilirubin: 0.5 mg/dL (ref 0.3–1.2)
Total Protein: 6.6 g/dL (ref 6.5–8.1)

## 2021-02-10 LAB — MAGNESIUM: Magnesium: 2.5 mg/dL — ABNORMAL HIGH (ref 1.7–2.4)

## 2021-02-10 LAB — GLUCOSE, CAPILLARY
Glucose-Capillary: 178 mg/dL — ABNORMAL HIGH (ref 70–99)
Glucose-Capillary: 218 mg/dL — ABNORMAL HIGH (ref 70–99)

## 2021-02-10 LAB — CBC
HCT: 27.2 % — ABNORMAL LOW (ref 36.0–46.0)
Hemoglobin: 8.6 g/dL — ABNORMAL LOW (ref 12.0–15.0)
MCH: 33.2 pg (ref 26.0–34.0)
MCHC: 31.6 g/dL (ref 30.0–36.0)
MCV: 105 fL — ABNORMAL HIGH (ref 80.0–100.0)
Platelets: 336 10*3/uL (ref 150–400)
RBC: 2.59 MIL/uL — ABNORMAL LOW (ref 3.87–5.11)
RDW: 22.5 % — ABNORMAL HIGH (ref 11.5–15.5)
WBC: 9.7 10*3/uL (ref 4.0–10.5)
nRBC: 3 % — ABNORMAL HIGH (ref 0.0–0.2)

## 2021-02-10 MED ORDER — ALBUMIN HUMAN 25 % IV SOLN
25.0000 g | Freq: Once | INTRAVENOUS | Status: AC
  Administered 2021-02-10: 25 g via INTRAVENOUS
  Filled 2021-02-10: qty 100

## 2021-02-10 MED ORDER — BISACODYL 10 MG RE SUPP
10.0000 mg | Freq: Once | RECTAL | Status: AC
Start: 2021-02-10 — End: 2021-02-10
  Administered 2021-02-10: 10 mg via RECTAL
  Filled 2021-02-10: qty 1

## 2021-02-10 MED ORDER — FUROSEMIDE 10 MG/ML IJ SOLN
40.0000 mg | Freq: Once | INTRAMUSCULAR | Status: AC
  Administered 2021-02-10: 40 mg via INTRAVENOUS
  Filled 2021-02-10: qty 4

## 2021-02-10 MED ORDER — SORBITOL 70 % SOLN
30.0000 mL | Status: AC
  Administered 2021-02-10 (×2): 30 mL via ORAL
  Filled 2021-02-10 (×2): qty 30

## 2021-02-10 NOTE — Consult Note (Addendum)
Consultation Note Date: 02/10/2021   Patient Name: Leslie [name]  DOB: 03-Aug-1945  MRN: [id number]  Age / Sex: 76 y.o., female  PCP: Mayra Neer, MD Referring Physician: Eugenie Filler, MD  Reason for Consultation: Establishing goals of care  HPI/Patient Profile: 76 y.o. 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  Patient admitted to hospital medicine service, brief consultation with neurosurgery has also been done, patient now being followed by oncology and medical oncology.  Has undergone lumbar puncture.  Is being considered for palliative radiation.  Palliative care consultation for ongoing goals of care discussions and additional support has been requested. Palliative medicine is specialized medical care for people living with serious illness. It focuses on providing relief from the symptoms and stress of a serious illness. The goal is to improve quality of life for both the patient and the family. Goals of care: Broad aims of medical therapy in relation to the patient's values and preferences. Our aim is to provide medical care aimed at enabling patients to achieve the goals that matter most to them, given the circumstances of their particular medical situation and their constraints.   Patient is awake alert resting in bed.  She states that her legs do not feel so weak anymore.  She still has a Foley catheter.  When asked about bowel  movements, she states she has not had 1 in over a week.  She has a baseline history of constipation and takes 1-2 stool softeners at home on a regular basis.  She is on low-dose opioids.  We discussed about increasing her bowel regimen. Further discussions as noted below.  [facility] [place] .  SUMMARY OF RECOMMENDATIONS   Agree with DO NOT RESUSCITATE Continue current mode of care Will increase her bowel regimen, patient states that she likely has not had a bowel movement for the past week: Chart reviewed, patient is to get Dulcolax rectal suppository today, remains on adequate bowel regimen of Senokot as well as MiraLAX p.o., continue to monitor. Advance care planning: Patient states that she has completed documents with her attorney and has designated her son Shaelyn Decarli to be her healthcare power of attorney agent. Patient using oxycodone immediate release for pain, continue to monitor. Offered active listening and supportive presence and other therapeutic techniques as the patient awaits further discussions between oncology and radiation oncology and will likely be starting radiation soon.  She has good insight into the serious nature of her condition and states that she knows she can or change it so she is in a place of acceptance.  Offered support and compassionate presence.  Continue efforts with physical therapy.  Patient lives alone in [place], [place], her niece lives next door.  Physical therapy initial evaluation note reviewed, initial recommendations are being made for home health physical therapy.  Recommend outpatient palliative services. Thank you for the consult. Code Status/Advance Care Planning: DNR   Symptom Management:     Palliative Prophylaxis:  Bowel Regimen  Psycho-social/Spiritual:  Desire for further Chaplaincy support:yes Additional Recommendations: Caregiving  Support/Resources  Prognosis:  Unable to determine  Discharge Planning: To Be  Determined      Primary Diagnoses: Present on Admission:  Cauda equina compression [facility])  Essential hypertension  Hyperlipidemia  Bone metastases ([place])  Acute urinary retention   I have reviewed the medical record, interviewed the patient and family, and examined the patient. The following aspects are pertinent.  Past Medical History:  Diagnosis Date   Benign breast cyst in female    Right breast   Cancer of left breast ([place]) 2010   Depression    History of blood transfusion    "related to tubal pregnancy"   Hypertension    PAD (peripheral artery disease) ([place])    Social History   Socioeconomic History   Marital status: Widowed    Spouse name: Not on file   Number of children: Not on file   Years of education: Not on file   Highest education level: Not on file  Occupational History   Not on file  Tobacco Use   Smoking status: Every Day    Packs/day: 0.50    Years: 45.00    Pack years: 22.50    Types: Cigarettes   Smokeless tobacco: Never  Vaping Use   Vaping Use: Never used  Substance and Sexual Activity   Alcohol use: Yes    Comment: 06/19/2017 "glass of wine 3-4 times/year"   Drug use: Never   Sexual activity: Not Currently    Birth control/protection: Post-menopausal, Surgical  Other Topics Concern   Not on file  Social History Narrative   Not on file   Social Determinants of Health   Financial Resource Strain: Not on file  Food Insecurity: Not on file  Transportation Needs: Not on file  Physical Activity: Not on file  Stress: Not on file  Social Connections: Not on file   Family History  Problem Relation Age of Onset   Hypertension Mother    Seizures Mother    Heart disease Father    Hypertension Sister    Scheduled Meds:  anastrozole  1 mg Oral Daily   Chlorhexidine Gluconate Cloth  6 each Topical Daily   cholecalciferol  400 Units Oral Daily   dexamethasone (DECADRON) injection  4 mg Intravenous Q6H   feeding supplement  237 mL Oral  BID BM   folic acid  0.5 mg Oral Daily   lidocaine  1 patch Transdermal Q24H   mirtazapine  7.5 mg Oral QHS   multivitamin with minerals  1 tablet Oral Daily   polyethylene glycol  17 g Oral BID   senna-docusate  1 tablet Oral BID   vitamin E  400 Units Oral Daily   Continuous Infusions: PRN Meds:.acetaminophen **OR** acetaminophen, ipratropium-albuterol, ondansetron **OR** ondansetron (ZOFRAN) IV, oxyCODONE, oxyCODONE-acetaminophen, sorbitol Medications Prior to Admission:  Prior to Admission medications   Medication Sig Start Date End Date Taking? Authorizing Provider  anastrozole (ARIMIDEX) 1 MG tablet Take 1 tablet (1 mg total) by mouth daily. 12/12/20  Yes Magrinat, Virgie Dad, MD  atenolol-chlorthalidone (TENORETIC) 50-25 MG per tablet  Take 0.5 tablets by mouth daily as needed (for elevated B/P).   Yes [provider]  Cholecalciferol (VITAMIN D3) 10 MCG (400 UNIT) CAPS Take 400 Units by mouth daily with breakfast.   Yes [provider]  Evening Primrose Oil 1000 MG CAPS Take 1,000 mg by mouth daily.   Yes [provider]  fluocinonide cream (LIDEX) 2.95 % Apply 1 application topically 2 (two) times daily as needed (for irritation- affected areas).   Yes [provider]  folic acid (FOLVITE) 284 MCG tablet Take 400 mcg by mouth daily.    Yes [provider]  lidocaine (LIDODERM) 5 % Place 1 patch onto the skin See admin instructions. Apply 1 patch to the lower back once a day and remove & discard the patch within 12 hours or as directed by MD 11/09/20  Yes Magrinat, Virgie Dad, MD  mirtazapine (REMERON) 7.5 MG tablet Take 7.5 mg by mouth at bedtime.   Yes [provider]  montelukast (SINGULAIR) 10 MG tablet Take 10 mg by mouth at bedtime.   Yes [provider]  Multiple Vitamin (MULTIVITAMIN PO) Take 1 tablet by mouth daily with breakfast.   Yes [provider]  ondansetron (ZOFRAN) 8 MG tablet Take 8 mg by mouth every  8 (eight) hours as needed for nausea or vomiting. 11/09/20  Yes Magrinat, Virgie Dad, MD  oxyCODONE-acetaminophen (PERCOCET/ROXICET) 5-325 MG tablet Take 1 tablet by mouth every 6 (six) hours as needed for severe pain. 02/05/21  Yes Hayden Rasmussen, MD  triamcinolone cream (KENALOG) 0.1 % Apply 1 application topically daily as needed (for itching- affectes sites). 05/07/19  Yes [provider]  TYLENOL 8 HOUR 650 MG CR tablet Take 650-1,300 mg by mouth every 8 (eight) hours as needed for pain.   Yes [provider]  vitamin E 400 UNIT capsule Take 400 Units by mouth daily.   Yes [provider]  gabapentin (NEURONTIN) 300 MG capsule Take 1 capsule (300 mg total) by mouth at bedtime. Patient not taking: Reported on 02/07/2021 12/30/19   Magrinat, Virgie Dad, MD  palbociclib [facility]) 125 MG tablet Take 1 tablet (125 mg total) by mouth daily. Take for 21 days on, 7 days off, repeat every 28 days. Patient not taking: Reported on 02/07/2021 07/18/20   Magrinat, Virgie Dad, MD  rosuvastatin (CRESTOR) 10 MG tablet Take 1 tablet (10 mg total) by mouth daily. Patient not taking: Reported on 02/07/2021 10/25/20   Lorretta Harp, MD  venlafaxine XR (EFFEXOR-XR) 37.5 MG 24 hr capsule Take 1 capsule (37.5 mg total) by mouth daily with breakfast. Patient not taking: Reported on 02/07/2021 03/14/20   Magrinat, Virgie Dad, MD   Allergies  Allergen Reactions   Aspirin Nausea And Vomiting   Cetirizine Other (See Comments)    Chest Pain    Rosuvastatin Other (See Comments)    MD suspended this due to bone pain   Review of Systems Admits to less weakness in LE than before  Physical Exam Patient is awake alert sitting up in bed Regular work of breathing S1-S2 Abdomen is not tender Denies any numbness or tingling Awake alert oriented answers all questions appropriately  Vital Signs: BP 134/66 (BP Location: Right Arm)    Pulse 64    Temp 98 F (36.7 C) (Oral)    Resp (!) 25    Wt 47.8 kg     SpO2 99%    BMI 20.08 kg/m  Pain Scale: 0-10   Pain  Score: 0-No pain   SpO2: SpO2: 99 % O2 Device:SpO2: 99 % O2 Flow Rate: .O2 Flow Rate (L/min): 3 L/min  IO: Intake/output summary:  Intake/Output Summary (Last 24 hours) at 02/10/2021 0947 Last data filed at 02/10/2021 0831 Gross per 24 hour  Intake 1260 ml  Output 1900 ml  Net -640 ml    LBM: Last BM Date:  (PTA) Baseline Weight: Weight: 45.5 kg Most recent weight: Weight: 47.8 kg     Palliative Assessment/Data:   PPS 40%  Time In:  8.30 Time Out:  9.30 Time Total:  60  Greater than 50%  of this time was spent counseling and coordinating care related to the above assessment and plan.  Signed by: Loistine Chance, MD   Please contact Palliative Medicine Team phone at [phone] for questions and concerns.  For individual provider: See Shea Evans

## 2021-02-10 NOTE — Progress Notes (Signed)
I called and spoke with the patient about her MRI C spine and brain. We discussed the leptomeningeal findings and that we would review her case on Monday morning in brain and spine conference. Clinically with steroids, she seems to be doing even better today and reports she was able to stand on her own to get out of her hospital bed and get to the wheelchair to go to MRI. She admits sensory perception is also improved of her lower extremities.     Carola Rhine, PAC

## 2021-02-10 NOTE — Progress Notes (Signed)
Occupational Therapy Treatment Patient Details Name: Leslie [name] MRN: [id number] DOB: May 22, 1945 Today's Date: 02/10/2021   History of present illness Patient is a 76 year old female admitted with urinary retention, back pain with radiation shooting into the legs, 3 to 4-week history of saddle anesthesia. 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  Patient seen in consultation by neurosurgery, Dr. Ronnald Ramp who recommended lumbar puncture, no surgical intervention.   PMH recurrent breast cancer with mets to the bone currently on zoledronate, anastrozole,palbociclib, hypertension, hyperlipidemia   OT comments  Patient was educated on walker tray, ECT, shower seats, and navigation over thresholds. Patient verbalized understanding. Patient completed bathing tasks seated in recliner and was able to simulate shower movements standing with one UE support occassionally with patient reporting having grab bars in tub/shower at home. Patient would continue to benefit from skilled OT services at this time while admitted and after d/c to address noted deficits in order to improve overall safety and independence in ADLs.     Recommendations for follow up therapy are one component of a multi-disciplinary discharge planning process, led by the attending physician.  Recommendations may be updated based on patient status, additional functional criteria and insurance authorization.    Follow Up Recommendations  Home health OT    Assistance Recommended at Discharge Intermittent Supervision/Assistance  Patient can return home with the following  Assistance with cooking/housework   Equipment Recommendations  Tub/shower seat    Recommendations for Other Services      Precautions /  Restrictions Precautions Precautions: Fall Restrictions Weight Bearing Restrictions: No       Mobility Bed Mobility               General bed mobility comments: patient was seated in recliner at start of session    Transfers                         Balance                                           ADL either performed or assessed with clinical judgement   ADL Overall ADL's : Needs assistance/impaired           Upper Body Bathing Details (indicate cue type and reason): patient compelted seated bathign tasks in recliner with MI after set up with increased time.   Lower Body Bathing Details (indicate cue type and reason): simulated LB bathing tasks in standing with RW with SUP and increased time. patient reported having grab bars in tub/shower at home                            Extremity/Trunk Assessment Upper Extremity Assessment Upper Extremity Assessment: Overall WFL for tasks assessed   Lower Extremity Assessment Lower Extremity Assessment: Defer to PT evaluation   Cervical / Trunk Assessment Cervical / Trunk Assessment: Normal    Vision       Perception     Praxis      Cognition Arousal/Alertness: Awake/alert Behavior During Therapy: WFL for tasks assessed/performed Overall Cognitive Status: Within Functional Limits for tasks assessed  Exercises      Shoulder Instructions       General Comments      Pertinent Vitals/ Pain       Pain Assessment Pain Assessment: No/denies pain  Home Living                                          Prior Functioning/Environment              Frequency  Min 2X/week        Progress Toward Goals  OT Goals(current goals can now be found in the care plan section)  Progress towards OT goals: Progressing toward goals     Plan Discharge plan remains appropriate    Co-evaluation                  AM-PAC OT "6 Clicks" Daily Activity     Outcome Measure   Help from another person eating meals?: None Help from another person taking care of personal grooming?: A Little Help from another person toileting, which includes using toliet, bedpan, or urinal?: A Little Help from another person bathing (including washing, rinsing, drying)?: A Little Help from another person to put on and taking off regular upper body clothing?: A Little Help from another person to put on and taking off regular lower body clothing?: A Little 6 Click Score: 19    End of Session Equipment Utilized During Treatment: Gait belt;Rolling walker (2 wheels)  OT Visit Diagnosis: Unsteadiness on feet (R26.81);Other abnormalities of gait and mobility (R26.89);History of falling (Z91.81)   Activity Tolerance Patient tolerated treatment well   Patient Left in chair;with call bell/phone within reach   Nurse Communication Mobility status        Time: 1655-3748 OT Time Calculation (min): 26 min  Charges: OT General Charges $OT Visit: 1 Visit OT Treatments $Self Care/Home Management : 23-37 mins  Leslie [name] OTR/L, [name] Acute Rehabilitation Department Office# [phone] Pager# [phone]   Leslie [name] 02/10/2021, 12:37 PM

## 2021-02-10 NOTE — Progress Notes (Signed)
Physical Therapy Treatment Patient Details Name: Leslie [name] MRN: [id number] DOB: [birth year] Today's Date: 02/10/2021   History of Present Illness Patient is a 76 year old female admitted with urinary retention, wer back pain with radiation shooting into the legs, 3 to 4-week history of saddle anesthesia. 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  Patient seen in consultation by neurosurgery, Dr. Ronnald Ramp who recommended lumbar puncture, no surgical intervention.   PMH recurrent breast cancer with mets to the bone currently on zoledronate, anastrozole,palbociclib, hypertension, hyperlipidemia    PT Comments    Pt progressing toward PT goals. Pt reports improved sensation in LEs/feet today with mobility.  Incr overall amb distance with HR max of 126 as well as pt maintaining O2 saturations in 90s on RA; RN made aware. Continue to feel pt can d/c with f/u HHPT, will likely need assist as noted below.     Recommendations for follow up therapy are one component of a multi-disciplinary discharge planning process, led by the attending physician.  Recommendations may be updated based on patient status, additional functional criteria and insurance authorization.  Follow Up Recommendations  Home health PT     Assistance Recommended at Discharge Frequent or constant Supervision/Assistance  Patient can return home with the following A little help with walking and/or transfers;Assistance with cooking/housework;Assist for transportation;Help with stairs or ramp for entrance;A little help with bathing/dressing/bathroom   Equipment Recommendations  Rolling walker (2 wheels)    Recommendations for Other Services       Precautions / Restrictions Precautions Precautions:  Fall Restrictions Weight Bearing Restrictions: No     Mobility  Bed Mobility Overal bed mobility: Modified Independent             General bed mobility comments: HOB elevated and extra time to pivot to EOB.    Transfers Overall transfer level: Needs assistance Equipment used: Rolling walker (2 wheels) Transfers: Sit to/from Stand Sit to Stand: Min guard           General transfer comment: pt steady with rise from EOB and recliner, assist for line management. cues for hand placement    Ambulation/Gait Ambulation/Gait assistance: Min assist, Min guard Gait Distance (Feet): 85 Feet (x2) Assistive device: Rolling walker (2 wheels) Gait Pattern/deviations: Step-through pattern, Decreased stride length, Narrow base of support       General Gait Details: verbal cues for posture and upward gaze, NBOS however without scissoring, good foot clearance with pt reporting improved sensation in feet with amb. seated rest between distances above with pt reporting minimal fatigue.  HR max of 126, SpO2=92-100% on RA   Stairs             Wheelchair Mobility    Modified Rankin (Stroke Patients Only)       Balance Overall balance assessment: Needs assistance, History of Falls Sitting-balance support: Feet supported Sitting balance-Leahy Scale: Good     Standing balance support: Single extremity supported, Bilateral upper extremity supported, Reliant on assistive device for balance, During functional activity Standing balance-Leahy Scale: Poor Standing balance comment: relies on at least unilateral UE support for dynamic activities. briefly able to static stand without UE support  Cognition Arousal/Alertness: Awake/alert Behavior During Therapy: WFL for tasks assessed/performed Overall Cognitive Status: Within Functional Limits for tasks assessed                                          Exercises      General  Comments        Pertinent Vitals/Pain Pain Assessment Pain Assessment: No/denies pain    Home Living                          Prior Function            PT Goals (current goals can now be found in the care plan section) Acute Rehab PT Goals Patient Stated Goal: get better and go back home PT Goal Formulation: With patient Time For Goal Achievement: 02/22/21 Potential to Achieve Goals: Good Progress towards PT goals: Progressing toward goals    Frequency    Min 3X/week      PT Plan Current plan remains appropriate    Co-evaluation              AM-PAC PT "6 Clicks" Mobility   Outcome Measure  Help needed turning from your back to your side while in a flat bed without using bedrails?: None Help needed moving from lying on your back to sitting on the side of a flat bed without using bedrails?: None Help needed moving to and from a bed to a chair (including a wheelchair)?: A Little Help needed standing up from a chair using your arms (e.g., wheelchair or bedside chair)?: A Little Help needed to walk in hospital room?: A Little Help needed climbing 3-5 steps with a railing? : A Lot 6 Click Score: 19    End of Session Equipment Utilized During Treatment: Gait belt Activity Tolerance: Patient tolerated treatment well Patient left: in chair;with call bell/phone within reach;with chair alarm set;with nursing/sitter in room Nurse Communication: Mobility status PT Visit Diagnosis: Muscle weakness (generalized) (M62.81);Difficulty in walking, not elsewhere classified (R26.2)     Time: 2248-2500 PT Time Calculation (min) (ACUTE ONLY): 21 min  Charges:  $Gait Training: 8-22 mins                     Baxter Flattery, PT  Acute Rehab Dept ([place]) [phone] Pager [phone]  02/10/2021    [facility] 02/10/2021, 12:08 PM

## 2021-02-10 NOTE — Progress Notes (Signed)
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 Jana Hakim who has retired, hypertension, hyperlipidemia presented to the ED with urinary retention for the past 2 to 3 days, some lower back pain with radiation shooting into the legs, 3 to 4-week history of saddle anesthesia.  Foley catheter placed with good urine output.  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  Patient seen in consultation by neurosurgery, Dr. Ronnald Ramp who recommended lumbar puncture, no surgical intervention at this time, further evaluation by oncology and radiation oncology.   Assessment & Plan:   Principal Problem:   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HCC) Active Problems:   Essential hypertension   Hyperlipidemia   Bone metastases (HCC)   Malignant neoplasm of upper-outer quadrant of left breast in female, estrogen receptor positive ([place])   Acute urinary retention   Protein-calorie malnutrition, severe   Constipation   Leptomeningeal disease   Metastatic malignant neoplasm ([place])  #1 concern for cauda equina syndrome/leptomeningeal spread -Patient presented with urinary retention, few weeks of saddle anesthesia, some intermittent low back pain with radiation to the lower extremities. -MRI of the L and T-spine concerning for multiple epidural tumors/disease in the lower T-spine with moderate stenosis at T10.  Findings concerning for mild cauda equina. -Patient seen in  consultation by neurosurgery, Dr. Ronnald Ramp who reviewed films and feels findings concerning for leptomeningeal spread of tumor with a very poor prognosis.  Concerned that patient may also have some spinal stenosis in the lumbar region causing the claudication-like symptoms, bladder dysfunction may be more related to edema within the conus down from compression at T10 per neurosurgery. -Neurosurgery recommended high-volume lumbar puncture for cytology to rule out leptomeningeal disease, oncology, radiation oncology consultations. -IR consulted for lumbar puncture which was done under fluoroscopy 02/08/2021.  LP studies with cytology pending, no organisms noted.  -Head CT with no acute abnormalities. 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  Per neuro oncology patient will be presented at brain/spine tumor board on 02/12/2021, feel patient may benefit from palliative radiation but limited options and recommending palliative care consultation which has been placed. 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  -Continue IV Decadron and will likely transition to oral Decadron in the next 1 to 2 days. -Radiation oncology following and assessing for palliative radiation and patient for simulation on Monday, 02/12/2021. -Neuro oncology/medical oncology/radiation oncology/neurosurgery following and appreciate input and recommendations. -Palliative care consultation pending for goals of care  2.  Urinary retention -Likely secondary to problem #1. -Foley catheter placed in the ED after bladder scan showed 700 cc of urinary retention with good urine output. -See  #1. -Urine output of 1.2 L over the past 24 hours. -Likely be discharged home with Foley catheter in  place. -May need outpatient follow-up with urology  3.  Hypokalemia -Repleted.  4.  Metastatic breast cancer -CT chest abdomen and pelvis with severe diffuse lytic and sclerotic metastatic bone disease, several areas of masslike enhancement in the muscles of the spine and pelvis worrisome for tumor involvement, no findings suspicious for pulmonary metastatic disease, bilateral pleural effusions left greater than right, no findings suspicious for abdominal/pelvic metastatic disease. 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  Known osseous metastatic disease. -Continue home regimen of Arimidex.   -Continue IV Decadron. -Medical oncology consulted and following.  -Palliative care consultation pending.  5.  Hypertension -Patient states was on antihypertensive medications but PCP discontinued them per patient. -Blood pressure stable. -Follow.  6.  Hyperlipidemia -Stated was on a statin however held per her oncology recommendations. -Continue to hold statin. -Outpatient follow-up.  7.  Transaminitis -Likely secondary to possible volume overload in the setting of hypoalbuminemia. -CT abdomen and pelvis with no hepatic lesions to suggest metastatic disease, gallbladder unremarkable, no intra or extrahepatic biliary dilatation. -We will give a dose of IV Lasix today. -Repeat labs in the AM.  8.  Anemia of chronic disease -Patient denies any overt bleeding. -Hemoglobin went as low as 7.0 from 8.3 (02/05/2021 ). -Status post transfusion 1  unit packed red blood cells hemoglobin currently at 8.6 this morning.  -Follow H&H. -Transfusion threshold hemoglobin < 7.  9.  Constipation -Patient states no bowel movement in over a week. -Continue MiraLAX twice daily, Senokot-S twice daily. -Sorbitol p.o. every 2 hours x2 doses. -Dulcolax suppository. -If no results may need enema.  10.  Severe protein calorie malnutrition -Likely secondary to metastatic breast cancer. -Continue nutritional supplementation. -IV albumin x1.  11.  Debility -PT/OT.       DVT prophylaxis: SCDs Code Status: DNR Family Communication: Updated patient.  No family at bedside. Disposition:   Status is: Inpatient  Remains inpatient appropriate because: Severity of illness       Consultants:  Neuro oncology 02/08/2021: Dr. Mickeal Skinner Medical oncology: Dr.Iruku 02/08/2021 Neurosurgery: Dr. Ronnald Ramp 02/07/2021 Radiation oncology: Dr. Henreitta Cea, PA 02/08/2021  Procedures:  CT head 02/08/2021 Chest x-ray 02/07/2021 MRI of the T and L-spine 02/07/2021 Lumbar puncture on the fluoroscopic guidance by IR: Dr. Earma Reading 02/08/2021 Transfusion 1 unit packed red blood cells 02/09/2021 MRI head MRI C-spine 02/09/2021     Antimicrobials:  None   Subjective: Laying in bed.  Seems somewhat nervous.  States starting to move around a little bit better.  No chest pain.  Denies any significant shortness of breath however states she was told needed oxygen.  Still no bowel movement in over 1 week.  Objective: Vitals:   02/10/21 0500 02/10/21 0600 02/10/21 0700 02/10/21 0800  BP:    134/66  Pulse: 67 62 63 64  Resp: 16 17 14  (!) 25  Temp:      TempSrc:      SpO2: 97% 99% 98% 99%  Weight:        Intake/Output Summary (Last 24 hours) at 02/10/2021 0949 Last data filed at 02/10/2021 0831 Gross per 24 hour  Intake 1260 ml  Output 1900 ml  Net -640 ml   Filed Weights   02/08/21  0500 02/09/21 0400  Weight: 45.5 kg 47.8 kg     Examination:  General exam: NAD Respiratory system: Decreased breath sounds in the bases.  No crackles.  Fair air movement.  Normal respiratory effort Cardiovascular system: Regular rate rhythm no murmurs rubs or gallops.  No JVD.  No lower extremity edema.  Gastrointestinal system: Abdomen is soft, nontender, nondistended, positive bowel sounds.  No rebound.  No guarding.  Central nervous system: Alert and oriented. No focal neurological deficits. Extremities: Symmetric 5 x 5 power. Skin: No rashes, lesions or ulcers Psychiatry: Judgement and insight appear normal. Mood & affect appropriate.     Data Reviewed: I have personally reviewed following labs and imaging studies  CBC: Recent Labs  Lab 02/05/21 1405 02/07/21 1341 02/08/21 0237 02/09/21 0231 02/09/21 1750 02/10/21 0241  WBC 10.3 8.7 8.1 8.5 10.3 9.7  NEUTROABS 5.9 4.4 5.2 6.0  --   --   HGB 8.3* 7.6* 7.4* 7.0* 9.7* 8.6*  HCT 26.3* 24.8* 24.2* 23.1* 30.4* 27.2*  MCV 112.4* 112.2* 115.2* 112.7* 104.8* 105.0*  PLT 458* 401* 317 321 360 676    Basic Metabolic Panel: Recent Labs  Lab 02/05/21 1405 02/07/21 1234 02/07/21 1239 02/07/21 1341 02/08/21 0237 02/09/21 0231 02/10/21 0241  NA 135  --   --  136 136 135 136  K 4.0  --   --  3.4* 4.4 4.5 4.6  CL 100  --   --  102 102 104 101  CO2 25  --   --  26 24 23 28   GLUCOSE 100*  --   --  113* 152* 200* 180*  BUN 11  --   --  14 12 17 23   CREATININE 0.83   < > 0.80 0.75 0.58 0.55 0.72  CALCIUM 9.1  --   --  9.2 9.0 8.5* 8.9  MG  --   --   --   --  2.2 2.3 2.5*   < > = values in this interval not displayed.    GFR: Estimated Creatinine Clearance: 46 mL/min (by C-G formula based on SCr of 0.72 mg/dL).  Liver Function Tests: Recent Labs  Lab 02/07/21 1341 02/08/21 0237 02/09/21 0231 02/10/21 0241  AST 63* 56* 42* 50*  ALT 51* 48* 43 50*  ALKPHOS 153* 159* 137* 144*  BILITOT 0.6 0.5 0.3 0.5  PROT 7.7 7.1 6.6 6.6  ALBUMIN 3.2* 2.8* 2.7* 2.7*     CBG: Recent Labs  Lab 02/08/21 0805 02/09/21 0750 02/10/21 0752  GLUCAP 146* 168* 178*     Recent Results (from the past 240 hour(s))  Resp Panel by RT-PCR (Flu A&B, Covid) Nasopharyngeal Swab     Status: None   Collection Time: 02/07/21  1:57 PM   Specimen: Nasopharyngeal Swab; Nasopharyngeal(NP) swabs in vial transport medium  Result Value Ref Range Status   SARS Coronavirus 2 by RT PCR NEGATIVE NEGATIVE Final    Comment: (NOTE) SARS-CoV-2 target nucleic acids are NOT DETECTED.  The SARS-CoV-2 RNA is generally detectable in upper respiratory specimens during the acute phase of infection. The lowest concentration of SARS-CoV-2 viral copies this assay can detect is 138 copies/mL. A negative result does not preclude SARS-Cov-2 infection and should not be used as the sole basis for treatment or other patient management decisions. A negative result may occur with  improper specimen collection/handling, submission of specimen other than nasopharyngeal swab, presence of viral mutation(s) within the areas targeted by this assay, and inadequate number of viral copies(<138 copies/mL).  A negative result must be combined with clinical observations, patient history, and epidemiological information. The expected result is Negative.  Fact Sheet for Patients:  EntrepreneurPulse.com.au  Fact Sheet for Healthcare Providers:  IncredibleEmployment.be  This test is no t yet approved or cleared by the [place] FDA and  has been authorized for detection and/or diagnosis of SARS-CoV-2 by FDA under an Emergency Use Authorization (EUA). This EUA will remain  in effect (meaning this test can be used) for the duration of the COVID-19 declaration under Section 564(b)(1) of the Act, 21 U.S.C.section 360bbb-3(b)(1), unless the authorization is terminated  or revoked sooner.       Influenza A by PCR NEGATIVE NEGATIVE Final   Influenza B by PCR NEGATIVE  NEGATIVE Final    Comment: (NOTE) The Xpert Xpress SARS-CoV-2/FLU/RSV plus assay is intended as an aid in the diagnosis of influenza from Nasopharyngeal swab specimens and should not be used as a sole basis for treatment. Nasal washings and aspirates are unacceptable for Xpert Xpress SARS-CoV-2/FLU/RSV testing.  Fact Sheet for Patients: EntrepreneurPulse.com.au  Fact Sheet for Healthcare Providers: IncredibleEmployment.be  This test is not yet approved or cleared by the [place] FDA and has been authorized for detection and/or diagnosis of SARS-CoV-2 by FDA under an Emergency Use Authorization (EUA). This EUA will remain in effect (meaning this test can be used) for the duration of the COVID-19 declaration under Section 564(b)(1) of the Act, 21 U.S.C. section 360bbb-3(b)(1), unless the authorization is terminated or revoked.  Performed at [facility], [place] [address], [place], [place] [address]   MRSA Next Gen by PCR, Nasal     Status: None   Collection Time: 02/07/21 10:35 PM   Specimen: Nasal Mucosa; Nasal Swab  Result Value Ref Range Status   MRSA by PCR Next Gen NOT DETECTED NOT DETECTED Final    Comment: (NOTE) The GeneXpert MRSA Assay (FDA approved for NASAL specimens only), is one component of a comprehensive MRSA colonization surveillance program. It is not intended to diagnose MRSA infection nor to guide or monitor treatment for MRSA infections. Test performance is not FDA approved in patients less than 85 years old. Performed at [facility], [place] [address] [place], [place] [address]   CSF culture w Gram Stain     Status: None (Preliminary result)   Collection Time: 02/08/21  1:20 PM   Specimen: PATH Cytology CSF; Cerebrospinal Fluid  Result Value Ref Range Status   Specimen Description   Final    CSF Performed at [facility] [address], [place], [place]  [address]    Special Requests   Final    NONE Performed at [facility], [place] [address], [place], [place] [address]    Gram Stain   Final    WBC PRESENT,BOTH PMN AND MONONUCLEAR NO ORGANISMS SEEN Gram Stain Report Called to,Read Back By and Verified With: R.PULEO, RN AT 5284 ON 01.19.23 BY N.Jurnee Nakayama CYTOSPIN SMEAR Performed at [facility] [address] [place], [place] [address]    Culture   Final    NO GROWTH 2 DAYS Performed at [facility] [address] [place], [place] [address]    Report Status PENDING  Incomplete          Radiology Studies: CT HEAD WO CONTRAST (5MM)  Result Date: 02/08/2021 CLINICAL DATA:  Study prior to lumbar puncture EXAM: CT HEAD WITHOUT CONTRAST TECHNIQUE: Contiguous axial images were obtained from the  base of the skull through the vertex without intravenous contrast. RADIATION DOSE REDUCTION: This exam was performed according to the departmental dose-optimization program which includes automated exposure control, adjustment of the mA and/or kV according to patient size and/or use of iterative reconstruction technique. COMPARISON:  MR brain done on 09/13/2009 FINDINGS: Brain: No acute intracranial findings are seen. Ventricles are not dilated. Cortical sulci are prominent. There are no epidural or subdural fluid collections. There is decreased density in the periventricular white matter. Vascular: Unremarkable. Skull: Unremarkable. Sinuses/Orbits: Visualized paranasal sinuses are unremarkable. There is fluid in the right mastoid air cells. There is no break in the cortical margins in the right mastoid. Other: None IMPRESSION: No acute intracranial findings are seen. Atrophy. Small-vessel disease. There is fluid density in the right mastoid air cells suggesting right mastoid effusion or chronic right mastoiditis. Electronically Signed   By: Elmer Picker M.D.   On: 02/08/2021 12:43   CT CHEST W  CONTRAST  Result Date: 02/08/2021 CLINICAL DATA:  Restaging metastatic breast cancer. EXAM: CT CHEST, ABDOMEN, AND PELVIS WITH CONTRAST TECHNIQUE: Multidetector CT imaging of the chest, abdomen and pelvis was performed following the standard protocol during bolus administration of intravenous contrast. RADIATION DOSE REDUCTION: This exam was performed according to the departmental dose-optimization program which includes automated exposure control, adjustment of the mA and/or kV according to patient size and/or use of iterative reconstruction technique. CONTRAST:  4mL OMNIPAQUE IOHEXOL 300 MG/ML  SOLN COMPARISON:  Chest CT 01/03/2021. Abdominal/pelvic CT scan 08/14/2018 FINDINGS: CT CHEST FINDINGS Cardiovascular: The heart is normal in size. No pericardial effusion. The aorta is normal in caliber. No dissection. Stable aortic and coronary artery calcifications. Mediastinum/Nodes: No mediastinal or hilar mass or lymphadenopathy. The esophagus is grossly normal. Lungs/Pleura: Stable emphysematous changes and areas of pulmonary scarring. Small scattered perifissural nodules consistent with small lymph nodes. No worrisome pulmonary lesions to suggest pulmonary metastatic disease. There are bilateral pleural effusions, left larger than right with overlying atelectasis left greater than right. No obvious enhancing pleural nodules. Musculoskeletal: Overall stable appearing severe diffuse metastatic bone disease with mixed lytic and sclerotic changes and old pathologic fractures. I do not see any obvious new or progressive findings. The left paraspinal muscles are thickened and demonstrate heterogeneous enhancement findings suspicious for tumor involvement. CT ABDOMEN PELVIS FINDINGS Hepatobiliary: No hepatic lesions to suggest metastatic disease. The gallbladder is unremarkable. No intra or extrahepatic biliary dilatation. Pancreas: No mass, inflammation or ductal dilatation. Spleen: Normal size.  No focal lesions.  Adrenals/Urinary Tract: The adrenal glands and kidneys are unremarkable and stable. Small renal cysts are noted. No worrisome renal lesions. The bladder is decompressed by a Foley catheter. Stomach/Bowel: Stomach, duodenum, small bowel and colon are grossly normal. Vascular/Lymphatic: Stable advanced atherosclerotic calcifications involving the aorta and iliac arteries but no aneurysm. Stable small scattered mesenteric and retroperitoneal lymph nodes but no mass or adenopathy. No findings suspicious for peritoneal carcinomatosis. Reproductive: Surgically absent. Other: No free air or free fluid. 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 There is a large lytic lesion involving the right acetabulum but I do not see a definite pathologic fracture. Lytic lesion involving the upper right iliac bone with destruction of the medial cortex. No obvious pathologic hip fracture. Large enhancing masslike area in the left gluteus medius muscle worrisome for a muscle involvement, likely from direct extension. Other areas of abnormal muscle enhancement could suggest muscle  lesions. IMPRESSION: 1. 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 2. Several areas of masslike enhancement in the muscles of the spine and pelvis worrisome for tumor involvement. 3. No findings suspicious for pulmonary metastatic disease. 4. Bilateral pleural effusions, left larger than right with overlying atelectasis left greater than right. 5. No findings suspicious for abdominal/pelvic metastatic disease. 6. Stable advanced atherosclerotic calcifications involving the thoracic and abdominal aorta and iliac arteries. Aortic Atherosclerosis (ICD10-I70.0) and Emphysema (ICD10-J43.9). Electronically Signed   By: Marijo Sanes M.D.   On: 02/08/2021 19:23   MR BRAIN W WO CONTRAST  Result Date:  02/09/2021 CLINICAL DATA:  Restaging metastatic cancer, history of breast cancer with widespread osseous metastatic disease EXAM: MRI HEAD WITHOUT AND WITH CONTRAST MRI CERVICAL SPINE WITHOUT AND WITH CONTRAST TECHNIQUE: Multiplanar, multiecho pulse sequences of the brain and surrounding structures, and cervical spine, to include the craniocervical junction and cervicothoracic junction, were obtained without and with intravenous contrast. CONTRAST:  14mL GADAVIST GADOBUTROL 1 MMOL/ML IV SOLN COMPARISON:  09/13/2009 MRI head, correlation is also made with 02/08/2021 CT head; no prior MRI of the cervical spine, correlation is made with 02/07/2021 MRI thoracic spine FINDINGS: MRI HEAD FINDINGS Brain: No restricted diffusion to suggest acute or subacute infarct. No acute hemorrhage, mass, mass effect, or midline shift. Leptomeningeal enhancement in the anterior temporal lobes (series 20, images 23 and 22), posterior left temporal lobe (series 20, image 22), along the anterior surface of the midbrain (series 28, image 25) and pons (series 30, image 12), and in the cerebellar folia (series 28, images 17-25). Enhancement of the medulla and brainstem are better seen on the cervical spine images (series 26, image 7). No hydrocephalus or extra-axial collection. Scattered T2 hyperintense signal in the periventricular white matter, likely the sequela of mild chronic small vessel ischemic disease. No cerebral edema. Vascular: Normal flow voids. Skull and upper cervical spine: In the midline/left parietal calvarium, there is a 10 x 5 x 8 mm mildly enhancing lesion (series 28, image 35 and series 30, image 14). Otherwise normal marrow signal. Sinuses/Orbits: No acute finding. Status post bilateral lens replacements. Other: Fluid throughout the right mastoid air cells. MRI CERVICAL SPINE FINDINGS Evaluation is somewhat limited by motion artifact. Alignment: Straightening of the normal cervical lordosis. No listhesis. Vertebrae:  Abnormal T1 signal with patchy enhancement, for example in C2 and C6 consistent with known extensive osseous metastatic disease. No evidence of pathologic fracture. For findings in the thoracic spine, see 02/07/2021 MRI thoracic spine. Cord: Leptomeningeal enhancement along the brainstem and majority of the cervical spinal cord (series 26, image 7 and series 27, image 13). The spinal cord demonstrates increased T2 signal centrally from the level of C2 and extending inferiorly throughout the imaged spinal cord, although this is most prominent from the level of C5 extending inferiorly (series 25, image 7 and series 22, image 17). From C6 through C7-T1, there also appears to be prominence of the central canal of the spinal cord (series 22, images 21-27). Posterior Fossa, vertebral arteries, paraspinal tissues: For findings related to the pons and cerebellum, please see MRI brain findings above. Disc levels: Degenerative changes without significant spinal canal stenosis. IMPRESSION: MRI BRAIN: 1. 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 2. No abnormal parenchymal enhancement to suggest parenchymal metastatic disease. 3. Focus of enhancement in the left parietal calvarium is favored to be benign. Attention on  follow-up. MRI CERVICAL SPINE 1. Leptomeningeal enhancement along the brainstem and the majority of the cervical spinal cord, with increased T2 signal in the cervical spinal cord, most likely edema. In addition, there may be prominence of the central canal from C6 through C7-T1. 2. Known osseous metastatic disease, without evidence of pathologic fracture. Electronically Signed   By: Merilyn Baba M.D.   On: 02/09/2021 14:28   MR CERVICAL SPINE W WO CONTRAST  Result Date: 02/09/2021 CLINICAL DATA:  Restaging metastatic cancer, history of breast cancer with widespread osseous  metastatic disease EXAM: MRI HEAD WITHOUT AND WITH CONTRAST MRI CERVICAL SPINE WITHOUT AND WITH CONTRAST TECHNIQUE: Multiplanar, multiecho pulse sequences of the brain and surrounding structures, and cervical spine, to include the craniocervical junction and cervicothoracic junction, were obtained without and with intravenous contrast. CONTRAST:  23mL GADAVIST GADOBUTROL 1 MMOL/ML IV SOLN COMPARISON:  09/13/2009 MRI head, correlation is also made with 02/08/2021 CT head; no prior MRI of the cervical spine, correlation is made with 02/07/2021 MRI thoracic spine FINDINGS: MRI HEAD FINDINGS Brain: No restricted diffusion to suggest acute or subacute infarct. No acute hemorrhage, mass, mass effect, or midline shift. Leptomeningeal enhancement in the anterior temporal lobes (series 20, images 23 and 22), posterior left temporal lobe (series 20, image 22), along the anterior surface of the midbrain (series 28, image 25) and pons (series 30, image 12), and in the cerebellar folia (series 28, images 17-25). Enhancement of the medulla and brainstem are better seen on the cervical spine images (series 26, image 7). No hydrocephalus or extra-axial collection. Scattered T2 hyperintense signal in the periventricular white matter, likely the sequela of mild chronic small vessel ischemic disease. No cerebral edema. Vascular: Normal flow voids. Skull and upper cervical spine: In the midline/left parietal calvarium, there is a 10 x 5 x 8 mm mildly enhancing lesion (series 28, image 35 and series 30, image 14). Otherwise normal marrow signal. Sinuses/Orbits: No acute finding. Status post bilateral lens replacements. Other: Fluid throughout the right mastoid air cells. MRI CERVICAL SPINE FINDINGS Evaluation is somewhat limited by motion artifact. Alignment: Straightening of the normal cervical lordosis. No listhesis. Vertebrae: Abnormal T1 signal with patchy enhancement, for example in C2 and C6 consistent with known extensive osseous  metastatic disease. No evidence of pathologic fracture. For findings in the thoracic spine, see 02/07/2021 MRI thoracic spine. Cord: Leptomeningeal enhancement along the brainstem and majority of the cervical spinal cord (series 26, image 7 and series 27, image 13). The spinal cord demonstrates increased T2 signal centrally from the level of C2 and extending inferiorly throughout the imaged spinal cord, although this is most prominent from the level of C5 extending inferiorly (series 25, image 7 and series 22, image 17). From C6 through C7-T1, there also appears to be prominence of the central canal of the spinal cord (series 22, images 21-27). Posterior Fossa, vertebral arteries, paraspinal tissues: For findings related to the pons and cerebellum, please see MRI brain findings above. Disc levels: Degenerative changes without significant spinal canal stenosis. IMPRESSION: MRI BRAIN: 1. 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 2. No abnormal parenchymal enhancement to suggest parenchymal metastatic disease. 3. Focus of enhancement in the left parietal calvarium is favored to be benign. Attention on follow-up. MRI CERVICAL SPINE 1. Leptomeningeal enhancement along the brainstem and the majority of the cervical spinal cord, with increased T2 signal in the cervical spinal cord, most likely  edema. In addition, there may be prominence of the central canal from C6 through C7-T1. 2. Known osseous metastatic disease, without evidence of pathologic fracture. Electronically Signed   By: Merilyn Baba M.D.   On: 02/09/2021 14:28   CT ABDOMEN PELVIS W CONTRAST  Result Date: 02/08/2021 CLINICAL DATA:  Restaging metastatic breast cancer. EXAM: CT CHEST, ABDOMEN, AND PELVIS WITH CONTRAST TECHNIQUE: Multidetector CT imaging of the chest, abdomen and pelvis was performed following the standard  protocol during bolus administration of intravenous contrast. RADIATION DOSE REDUCTION: This exam was performed according to the departmental dose-optimization program which includes automated exposure control, adjustment of the mA and/or kV according to patient size and/or use of iterative reconstruction technique. CONTRAST:  82mL OMNIPAQUE IOHEXOL 300 MG/ML  SOLN COMPARISON:  Chest CT 01/03/2021. Abdominal/pelvic CT scan 08/14/2018 FINDINGS: CT CHEST FINDINGS Cardiovascular: The heart is normal in size. No pericardial effusion. The aorta is normal in caliber. No dissection. Stable aortic and coronary artery calcifications. Mediastinum/Nodes: No mediastinal or hilar mass or lymphadenopathy. The esophagus is grossly normal. Lungs/Pleura: Stable emphysematous changes and areas of pulmonary scarring. Small scattered perifissural nodules consistent with small lymph nodes. No worrisome pulmonary lesions to suggest pulmonary metastatic disease. There are bilateral pleural effusions, left larger than right with overlying atelectasis left greater than right. No obvious enhancing pleural nodules. Musculoskeletal: Overall stable appearing severe diffuse metastatic bone disease with mixed lytic and sclerotic changes and old pathologic fractures. I do not see any obvious new or progressive findings. The left paraspinal muscles are thickened and demonstrate heterogeneous enhancement findings suspicious for tumor involvement. CT ABDOMEN PELVIS FINDINGS Hepatobiliary: No hepatic lesions to suggest metastatic disease. The gallbladder is unremarkable. No intra or extrahepatic biliary dilatation. Pancreas: No mass, inflammation or ductal dilatation. Spleen: Normal size.  No focal lesions. Adrenals/Urinary Tract: The adrenal glands and kidneys are unremarkable and stable. Small renal cysts are noted. No worrisome renal lesions. The bladder is decompressed by a Foley catheter. Stomach/Bowel: Stomach, duodenum, small bowel and colon  are grossly normal. Vascular/Lymphatic: Stable advanced atherosclerotic calcifications involving the aorta and iliac arteries but no aneurysm. Stable small scattered mesenteric and retroperitoneal lymph nodes but no mass or adenopathy. No findings suspicious for peritoneal carcinomatosis. Reproductive: Surgically absent. Other: No free air or free fluid. 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 There is a large lytic lesion involving the right acetabulum but I do not see a definite pathologic fracture. Lytic lesion involving the upper right iliac bone with destruction of the medial cortex. No obvious pathologic hip fracture. Large enhancing masslike area in the left gluteus medius muscle worrisome for a muscle involvement, likely from direct extension. Other areas of abnormal muscle enhancement could suggest muscle lesions. IMPRESSION: 1. 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 2. Several areas of masslike enhancement in the muscles of the spine and pelvis worrisome for tumor involvement. 3. No findings suspicious for pulmonary metastatic disease. 4. Bilateral pleural effusions, left larger than right with overlying atelectasis left greater than right. 5. No findings suspicious for abdominal/pelvic metastatic disease. 6. Stable advanced atherosclerotic calcifications involving the thoracic and abdominal aorta and iliac arteries. Aortic Atherosclerosis (ICD10-I70.0) and Emphysema (ICD10-J43.9). Electronically Signed   By: Marijo Sanes M.D.   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 EXAM: DIAGNOSTIC LUMBAR PUNCTURE UNDER FLUOROSCOPIC GUIDANCE COMPARISON:  Spine MRI 02/07/2021. Today's head CT, dictated separately. FLUOROSCOPY TIME:   Fluoroscopy Time:  2 minutes and 48 seconds Radiation Exposure Index (if provided by the fluoroscopic device): 14.9 mGy Number of Acquired Spot Images: 0 PROCEDURE: Informed consent was obtained from the patient prior to the procedure, including potential complications of headache, allergy, and pain. With the patient prone, the lower back was prepped with Betadine. 1% Lidocaine was used for local anesthesia. Lumbar puncture was performed at the L4-5 level using a 20 gauge needle with return of clear CSF with an opening pressure of 9 cm water. After obtaining 2 vials each with 2 cc of clear CSF, the return of CSF became sanguinous. 1 cc of sanguinous CSF was obtained. At this point, the tubing was removed and the stylette placed in order to attempt to clear the CSF flow. Despite multiple maneuvers, CSF return could not be obtained a second time. The patient tolerated the procedure well and there were no apparent complications. IMPRESSION: Successful lumbar puncture, as detailed above. Only a total of 5 cc of CSF were obtained, secondary to the CSF flow becoming sanguinous. Electronically Signed   By: Abigail Miyamoto M.D.   On: 02/08/2021 13:58        Scheduled Meds:  anastrozole  1 mg Oral Daily   Chlorhexidine Gluconate Cloth  6 each Topical Daily   cholecalciferol  400 Units Oral Daily   dexamethasone (DECADRON) injection  4 mg Intravenous Q6H   feeding supplement  237 mL Oral BID BM   folic acid  0.5 mg Oral Daily   lidocaine  1 patch Transdermal Q24H   mirtazapine  7.5 mg Oral QHS   multivitamin with minerals  1 tablet Oral Daily   polyethylene glycol  17 g Oral BID   senna-docusate  1 tablet Oral BID   vitamin E  400 Units Oral Daily   Continuous Infusions:     LOS: 3 days    Time spent: 45 minutes    Irine Seal, MD [facility]   To contact the attending provider between 7A-7P or the covering provider during after hours 7P-7A, please log into the web site [email or web address]  and access using universal [facility] password for that web site. If you do not have the password, please call the hospital operator.  02/10/2021, 9:49 AM

## 2021-02-11 DIAGNOSIS — C799 Secondary malignant neoplasm of unspecified site: Secondary | ICD-10-CM | POA: Diagnosis not present

## 2021-02-11 DIAGNOSIS — G834 Cauda equina syndrome: Secondary | ICD-10-CM | POA: Diagnosis not present

## 2021-02-11 DIAGNOSIS — R338 Other retention of urine: Secondary | ICD-10-CM | POA: Diagnosis not present

## 2021-02-11 DIAGNOSIS — I10 Essential (primary) hypertension: Secondary | ICD-10-CM | POA: Diagnosis not present

## 2021-02-11 LAB — CBC WITH DIFFERENTIAL/PLATELET
Abs Immature Granulocytes: 0.38 10*3/uL — ABNORMAL HIGH (ref 0.00–0.07)
Basophils Absolute: 0 10*3/uL (ref 0.0–0.1)
Basophils Relative: 0 %
Eosinophils Absolute: 0 10*3/uL (ref 0.0–0.5)
Eosinophils Relative: 0 %
HCT: 27.3 % — ABNORMAL LOW (ref 36.0–46.0)
Hemoglobin: 8.8 g/dL — ABNORMAL LOW (ref 12.0–15.0)
Immature Granulocytes: 3 %
Lymphocytes Relative: 21 %
Lymphs Abs: 2.4 10*3/uL (ref 0.7–4.0)
MCH: 33.7 pg (ref 26.0–34.0)
MCHC: 32.2 g/dL (ref 30.0–36.0)
MCV: 104.6 fL — ABNORMAL HIGH (ref 80.0–100.0)
Monocytes Absolute: 1.2 10*3/uL — ABNORMAL HIGH (ref 0.1–1.0)
Monocytes Relative: 10 %
Neutro Abs: 7.5 10*3/uL (ref 1.7–7.7)
Neutrophils Relative %: 66 %
Platelets: 333 10*3/uL (ref 150–400)
RBC: 2.61 MIL/uL — ABNORMAL LOW (ref 3.87–5.11)
RDW: 21.7 % — ABNORMAL HIGH (ref 11.5–15.5)
WBC: 11.5 10*3/uL — ABNORMAL HIGH (ref 4.0–10.5)
nRBC: 5.1 % — ABNORMAL HIGH (ref 0.0–0.2)

## 2021-02-11 LAB — COMPREHENSIVE METABOLIC PANEL
ALT: 59 U/L — ABNORMAL HIGH (ref 0–44)
AST: 61 U/L — ABNORMAL HIGH (ref 15–41)
Albumin: 3.1 g/dL — ABNORMAL LOW (ref 3.5–5.0)
Alkaline Phosphatase: 141 U/L — ABNORMAL HIGH (ref 38–126)
Anion gap: 8 (ref 5–15)
BUN: 25 mg/dL — ABNORMAL HIGH (ref 8–23)
CO2: 30 mmol/L (ref 22–32)
Calcium: 9.3 mg/dL (ref 8.9–10.3)
Chloride: 97 mmol/L — ABNORMAL LOW (ref 98–111)
Creatinine, Ser: 0.78 mg/dL (ref 0.44–1.00)
GFR, Estimated: >60 mL/min (ref >60)
Glucose, Bld: 177 mg/dL — ABNORMAL HIGH (ref 70–99)
Potassium: 4.4 mmol/L (ref 3.5–5.1)
Sodium: 135 mmol/L (ref 135–145)
Total Bilirubin: 0.6 mg/dL (ref 0.3–1.2)
Total Protein: 6.7 g/dL (ref 6.5–8.1)

## 2021-02-11 LAB — GLUCOSE, CAPILLARY: Glucose-Capillary: 161 mg/dL — ABNORMAL HIGH (ref 70–99)

## 2021-02-11 LAB — MAGNESIUM: Magnesium: 2.4 mg/dL (ref 1.7–2.4)

## 2021-02-11 MED ORDER — SENNOSIDES-DOCUSATE SODIUM 8.6-50 MG PO TABS
1.0000 | ORAL_TABLET | Freq: Two times a day (BID) | ORAL | Status: DC
  Administered 2021-02-12 – 2021-02-19 (×15): 1 via ORAL
  Filled 2021-02-11 (×15): qty 1

## 2021-02-11 MED ORDER — POLYETHYLENE GLYCOL 3350 17 G PO PACK
17.0000 g | PACK | Freq: Every day | ORAL | Status: DC
  Administered 2021-02-12 – 2021-02-19 (×8): 17 g via ORAL
  Filled 2021-02-11 (×8): qty 1

## 2021-02-11 NOTE — Plan of Care (Signed)

## 2021-02-11 NOTE — Progress Notes (Signed)
Pt report given to 4E RN.  Pt transported via w/c with all belongings including purse, walker, cane ,cell phone.  Pt denies and is without s/s pain, discomfort, difficulty breathing.  Vitals stable during transport.

## 2021-02-11 NOTE — Progress Notes (Signed)
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 Jana Hakim who has retired, hypertension, hyperlipidemia presented to the ED with urinary retention for the past 2 to 3 days, some lower back pain with radiation shooting into the legs, 3 to 4-week history of saddle anesthesia.  Foley catheter placed with good urine output.  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  Patient seen in consultation by neurosurgery, Dr. Ronnald Ramp who recommended lumbar puncture, no surgical intervention at this time, further evaluation by oncology and radiation oncology.   Assessment & Plan:   Principal Problem:   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HCC) Active Problems:   Essential hypertension   Hyperlipidemia   Bone metastases (HCC)   Malignant neoplasm of upper-outer quadrant of left breast in female, estrogen receptor positive ([place])   Acute urinary retention   Protein-calorie malnutrition, severe   Constipation   Leptomeningeal disease   Metastatic malignant neoplasm ([place])  #1 concern for cauda equina syndrome/leptomeningeal spread -Patient presented with urinary retention, few weeks of saddle anesthesia, some intermittent low back pain with radiation to the lower extremities. -MRI of the L and T-spine concerning for multiple epidural tumors/disease in the lower T-spine with moderate stenosis at T10.  Findings concerning for mild cauda equina. -Patient seen in  consultation by neurosurgery, Dr. Ronnald Ramp who reviewed films and feels findings concerning for leptomeningeal spread of tumor with a very poor prognosis.  Concerned that patient may also have some spinal stenosis in the lumbar region causing the claudication-like symptoms, bladder dysfunction may be more related to edema within the conus down from compression at T10 per neurosurgery. -Neurosurgery recommended high-volume lumbar puncture for cytology to rule out leptomeningeal disease, oncology, radiation oncology consultations. -IR consulted for lumbar puncture which was done under fluoroscopy 02/08/2021.  LP studies with cytology pending, no organisms noted.  -Head CT with no acute abnormalities. 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  Per neuro oncology patient will be presented at brain/spine tumor board on 02/12/2021, feel patient may benefit from palliative radiation but limited options and recommending palliative care consultation which has been placed. 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  -Continue IV Decadron and will likely transition to oral Decadron in the next 1 to 2 days. -Radiation oncology following and assessing for palliative radiation and patient for simulation on Monday, 02/12/2021. -Neuro oncology/medical oncology/radiation oncology/neurosurgery following and appreciate input and recommendations. -Palliative care consultation pending for goals of care  2.  Urinary retention -Likely secondary to problem #1. -Foley catheter placed in the ED after bladder scan showed 700 cc of urinary retention with good urine output. -See  #1. -Urine output of 2.5 L over the past 24 hours. -Likely will be discharged home with Foley catheter  in place. -May need outpatient follow-up with urology  3.  Hypokalemia -Repleted.  4.  Metastatic breast cancer -CT chest abdomen and pelvis with severe diffuse lytic and sclerotic metastatic bone disease, several areas of masslike enhancement in the muscles of the spine and pelvis worrisome for tumor involvement, no findings suspicious for pulmonary metastatic disease, bilateral pleural effusions left greater than right, no findings suspicious for abdominal/pelvic metastatic disease. 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  Known osseous metastatic disease. -Continue home regimen of Arimidex.   -Continue IV Decadron. -Medical oncology consulted and following.  -Palliative care consultation pending.  5.  Hypertension -Patient states was on antihypertensive medications but PCP discontinued them per patient. -Blood pressure stable. -Follow.  6.  Hyperlipidemia -Stated was on a statin however held per her oncology recommendations. -Continue to hold statin. -Outpatient follow-up.  7.  Transaminitis -Likely secondary to possible volume overload in the setting of hypoalbuminemia. -CT abdomen and pelvis with no hepatic lesions to suggest metastatic disease, gallbladder unremarkable, no intra or extrahepatic biliary dilatation. -Status post dose of IV Lasix with no significant change with transaminitis. -Outpatient follow-up.  8.  Anemia of chronic disease -Patient denies any overt bleeding. -Hemoglobin went as low as 7.0 from 8.3  (02/05/2021 ). -Status post transfusion 1 unit packed red blood cells hemoglobin currently at 8.8 this morning.  -Follow H&H. -Transfusion threshold hemoglobin < 7.  9.  Constipation -Patient states no bowel movement in over a week prior to admission. -Patient received MiraLAX twice daily, Senokot-S twice daily, sorbitol p.o. x2 doses, Dulcolax suppository with good results. -Hold off on bowel regimen today and resume bowel regimen tomorrow MiraLAX daily, Senokot-S twice daily. -Outpatient follow-up.  10.  Severe protein calorie malnutrition -Likely secondary to metastatic breast cancer. -Continue nutritional supplementation. -Status post IV albumin x1. -Albumin at 3.1 today.  11.  Debility -PT/OT. -May need home health therapies on discharge.       DVT prophylaxis: SCDs Code Status: DNR Family Communication: Updated patient.  No family at bedside. Disposition:   Status is: Inpatient  Remains inpatient appropriate because: Severity of illness       Consultants:  Neuro oncology 02/08/2021: Dr. Mickeal Skinner Medical oncology: Dr.Iruku 02/08/2021 Neurosurgery: Dr. Ronnald Ramp 02/07/2021 Radiation oncology: Dr. Henreitta Cea, PA 02/08/2021  Procedures:  CT head 02/08/2021 Chest x-ray 02/07/2021 MRI of the T and L-spine 02/07/2021 Lumbar puncture on the fluoroscopic guidance by IR: Dr. Earma Reading 02/08/2021 Transfusion 1 unit packed red blood cells 02/09/2021 MRI head MRI C-spine 02/09/2021     Antimicrobials:  None   Subjective: Laying in bed.  Had multiple bowel movements yesterday.  No chest pain.  No shortness of breath.  No abdominal pain.  Continues to feel movement is improving.  Good urine output per Foley catheter.  Tolerating current diet.  Curious about when she may be discharged.  Currently on room air with sats of 93 to 100%.    Objective: Vitals:   02/11/21 0000 02/11/21 0400 02/11/21 0500 02/11/21 0618  BP: (!) 132/54 (!) 114/49  (!) 139/59  Pulse: 73 61  68   Resp: 17 12  18   Temp: 98.2 F (36.8 C)   97.7 F (36.5 C)  TempSrc:  Oral   Oral  SpO2: 91% 93%  100%  Weight:   46.7 kg     Intake/Output Summary (Last 24 hours) at 02/11/2021 1123 Last data filed at 02/11/2021 0100 Gross per 24 hour  Intake 480 ml  Output 1850 ml  Net -1370 ml    Filed Weights   02/08/21 0500 02/09/21 0400 02/11/21 0500  Weight: 45.5 kg 47.8 kg 46.7 kg    Examination:  General exam: NAD. Respiratory system: CTA B.  No wheezes, no crackles, no rhonchi.  Normal respiratory effort.  Speaking in full sentences.  Cardiovascular system: RRR no murmurs rubs or gallops.  No JVD.  No lower extremity edema.  Gastrointestinal system: Abdomen is soft, nontender, nondistended, positive bowel sounds.  No rebound.  No guarding.  Central nervous system: Alert and oriented. No focal neurological deficits. Extremities: Symmetric 5 x 5 power. Skin: No rashes, lesions or ulcers Psychiatry: Judgement and insight appear normal. Mood & affect appropriate.     Data Reviewed: I have personally reviewed following labs and imaging studies  CBC: Recent Labs  Lab 02/05/21 1405 02/07/21 1341 02/08/21 0237 02/09/21 0231 02/09/21 1750 02/10/21 0241 02/11/21 0241  WBC 10.3 8.7 8.1 8.5 10.3 9.7 11.5*  NEUTROABS 5.9 4.4 5.2 6.0  --   --  7.5  HGB 8.3* 7.6* 7.4* 7.0* 9.7* 8.6* 8.8*  HCT 26.3* 24.8* 24.2* 23.1* 30.4* 27.2* 27.3*  MCV 112.4* 112.2* 115.2* 112.7* 104.8* 105.0* 104.6*  PLT 458* 401* 317 321 360 336 333     Basic Metabolic Panel: Recent Labs  Lab 02/07/21 1341 02/08/21 0237 02/09/21 0231 02/10/21 0241 02/11/21 0241  NA 136 136 135 136 135  K 3.4* 4.4 4.5 4.6 4.4  CL 102 102 104 101 97*  CO2 26 24 23 28 30   GLUCOSE 113* 152* 200* 180* 177*  BUN 14 12 17 23  25*  CREATININE 0.75 0.58 0.55 0.72 0.78  CALCIUM 9.2 9.0 8.5* 8.9 9.3  MG  --  2.2 2.3 2.5* 2.4     GFR: Estimated Creatinine Clearance: 45.5 mL/min (by C-G formula based on SCr of 0.78  mg/dL).  Liver Function Tests: Recent Labs  Lab 02/07/21 1341 02/08/21 0237 02/09/21 0231 02/10/21 0241 02/11/21 0241  AST 63* 56* 42* 50* 61*  ALT 51* 48* 43 50* 59*  ALKPHOS 153* 159* 137* 144* 141*  BILITOT 0.6 0.5 0.3 0.5 0.6  PROT 7.7 7.1 6.6 6.6 6.7  ALBUMIN 3.2* 2.8* 2.7* 2.7* 3.1*     CBG: Recent Labs  Lab 02/08/21 0805 02/09/21 0750 02/10/21 0752 02/10/21 1937 02/11/21 1032  GLUCAP 146* 168* 178* 218* 161*      Recent Results (from the past 240 hour(s))  Resp Panel by RT-PCR (Flu A&B, Covid) Nasopharyngeal Swab     Status: None   Collection Time: 02/07/21  1:57 PM   Specimen: Nasopharyngeal Swab; Nasopharyngeal(NP) swabs in vial transport medium  Result Value Ref Range Status   SARS Coronavirus 2 by RT PCR NEGATIVE NEGATIVE Final    Comment: (NOTE) SARS-CoV-2 target nucleic acids are NOT DETECTED.  The SARS-CoV-2 RNA is generally detectable in upper respiratory specimens during the acute phase of infection. The lowest concentration of SARS-CoV-2 viral copies this assay can detect is 138 copies/mL. A negative result does not preclude SARS-Cov-2 infection and should not be used as the sole basis for treatment or other patient management decisions. A negative result may occur with  improper specimen collection/handling, submission of specimen other than nasopharyngeal swab, presence  of viral mutation(s) within the areas targeted by this assay, and inadequate number of viral copies(<138 copies/mL). A negative result must be combined with clinical observations, patient history, and epidemiological information. The expected result is Negative.  Fact Sheet for Patients:  EntrepreneurPulse.com.au  Fact Sheet for Healthcare Providers:  IncredibleEmployment.be  This test is no t yet approved or cleared by the [place] FDA and  has been authorized for detection and/or diagnosis of SARS-CoV-2 by FDA under an Emergency  Use Authorization (EUA). This EUA will remain  in effect (meaning this test can be used) for the duration of the COVID-19 declaration under Section 564(b)(1) of the Act, 21 U.S.C.section 360bbb-3(b)(1), unless the authorization is terminated  or revoked sooner.       Influenza A by PCR NEGATIVE NEGATIVE Final   Influenza B by PCR NEGATIVE NEGATIVE Final    Comment: (NOTE) The Xpert Xpress SARS-CoV-2/FLU/RSV plus assay is intended as an aid in the diagnosis of influenza from Nasopharyngeal swab specimens and should not be used as a sole basis for treatment. Nasal washings and aspirates are unacceptable for Xpert Xpress SARS-CoV-2/FLU/RSV testing.  Fact Sheet for Patients: EntrepreneurPulse.com.au  Fact Sheet for Healthcare Providers: IncredibleEmployment.be  This test is not yet approved or cleared by the [place] FDA and has been authorized for detection and/or diagnosis of SARS-CoV-2 by FDA under an Emergency Use Authorization (EUA). This EUA will remain in effect (meaning this test can be used) for the duration of the COVID-19 declaration under Section 564(b)(1) of the Act, 21 U.S.C. section 360bbb-3(b)(1), unless the authorization is terminated or revoked.  Performed at [facility], [place] [address], [place], [place] [address]   MRSA Next Gen by PCR, Nasal     Status: None   Collection Time: 02/07/21 10:35 PM   Specimen: Nasal Mucosa; Nasal Swab  Result Value Ref Range Status   MRSA by PCR Next Gen NOT DETECTED NOT DETECTED Final    Comment: (NOTE) The GeneXpert MRSA Assay (FDA approved for NASAL specimens only), is one component of a comprehensive MRSA colonization surveillance program. It is not intended to diagnose MRSA infection nor to guide or monitor treatment for MRSA infections. Test performance is not FDA approved in patients less than 35 years old. Performed at [facility], [place]  [address], [place], [place] [address]   CSF culture w Gram Stain     Status: None (Preliminary result)   Collection Time: 02/08/21  1:20 PM   Specimen: PATH Cytology CSF; Cerebrospinal Fluid  Result Value Ref Range Status   Specimen Description   Final    CSF Performed at [place] [address] [place], [place] [address]    Special Requests   Final    NONE Performed at [facility], [place] [address] [place], [place] [address]    Gram Stain   Final    WBC PRESENT,BOTH PMN AND MONONUCLEAR NO ORGANISMS SEEN Gram Stain Report Called to,Read Back By and Verified With: R.PULEO, RN AT 7017 ON 01.19.23 BY N.Aiken Withem CYTOSPIN SMEAR Performed at [place] [address], [place], [place] [address]    Culture   Final    NO GROWTH 3 DAYS Performed at [facility], [place] [address] [place], [place] [address]    Report Status PENDING  Incomplete          Radiology Studies: MR BRAIN W WO CONTRAST  Result Date: 02/09/2021 CLINICAL DATA:  Restaging  metastatic cancer, history of breast cancer with widespread osseous metastatic disease EXAM: MRI HEAD WITHOUT AND WITH CONTRAST MRI CERVICAL SPINE WITHOUT AND WITH CONTRAST TECHNIQUE: Multiplanar, multiecho pulse sequences of the brain and surrounding structures, and cervical spine, to include the craniocervical junction and cervicothoracic junction, were obtained without and with intravenous contrast. CONTRAST:  40mL GADAVIST GADOBUTROL 1 MMOL/ML IV SOLN COMPARISON:  09/13/2009 MRI head, correlation is also made with 02/08/2021 CT head; no prior MRI of the cervical spine, correlation is made with 02/07/2021 MRI thoracic spine FINDINGS: MRI HEAD FINDINGS Brain: No restricted diffusion to suggest acute or subacute infarct. No acute hemorrhage, mass, mass effect, or midline shift. Leptomeningeal enhancement in the anterior temporal lobes (series 20, images 23 and 22), posterior left  temporal lobe (series 20, image 22), along the anterior surface of the midbrain (series 28, image 25) and pons (series 30, image 12), and in the cerebellar folia (series 28, images 17-25). Enhancement of the medulla and brainstem are better seen on the cervical spine images (series 26, image 7). No hydrocephalus or extra-axial collection. Scattered T2 hyperintense signal in the periventricular white matter, likely the sequela of mild chronic small vessel ischemic disease. No cerebral edema. Vascular: Normal flow voids. Skull and upper cervical spine: In the midline/left parietal calvarium, there is a 10 x 5 x 8 mm mildly enhancing lesion (series 28, image 35 and series 30, image 14). Otherwise normal marrow signal. Sinuses/Orbits: No acute finding. Status post bilateral lens replacements. Other: Fluid throughout the right mastoid air cells. MRI CERVICAL SPINE FINDINGS Evaluation is somewhat limited by motion artifact. Alignment: Straightening of the normal cervical lordosis. No listhesis. Vertebrae: Abnormal T1 signal with patchy enhancement, for example in C2 and C6 consistent with known extensive osseous metastatic disease. No evidence of pathologic fracture. For findings in the thoracic spine, see 02/07/2021 MRI thoracic spine. Cord: Leptomeningeal enhancement along the brainstem and majority of the cervical spinal cord (series 26, image 7 and series 27, image 13). The spinal cord demonstrates increased T2 signal centrally from the level of C2 and extending inferiorly throughout the imaged spinal cord, although this is most prominent from the level of C5 extending inferiorly (series 25, image 7 and series 22, image 17). From C6 through C7-T1, there also appears to be prominence of the central canal of the spinal cord (series 22, images 21-27). Posterior Fossa, vertebral arteries, paraspinal tissues: For findings related to the pons and cerebellum, please see MRI brain findings above. Disc levels: Degenerative  changes without significant spinal canal stenosis. IMPRESSION: MRI BRAIN: 1. 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 2. No abnormal parenchymal enhancement to suggest parenchymal metastatic disease. 3. Focus of enhancement in the left parietal calvarium is favored to be benign. Attention on follow-up. MRI CERVICAL SPINE 1. Leptomeningeal enhancement along the brainstem and the majority of the cervical spinal cord, with increased T2 signal in the cervical spinal cord, most likely edema. In addition, there may be prominence of the central canal from C6 through C7-T1. 2. Known osseous metastatic disease, without evidence of pathologic fracture. Electronically Signed   By: Merilyn Baba M.D.   On: 02/09/2021 14:28   MR CERVICAL SPINE W WO CONTRAST  Result Date: 02/09/2021 CLINICAL DATA:  Restaging metastatic cancer, history of breast cancer with widespread osseous metastatic disease EXAM: MRI HEAD WITHOUT AND WITH CONTRAST MRI CERVICAL SPINE WITHOUT AND WITH CONTRAST TECHNIQUE: Multiplanar, multiecho pulse  sequences of the brain and surrounding structures, and cervical spine, to include the craniocervical junction and cervicothoracic junction, were obtained without and with intravenous contrast. CONTRAST:  62mL GADAVIST GADOBUTROL 1 MMOL/ML IV SOLN COMPARISON:  09/13/2009 MRI head, correlation is also made with 02/08/2021 CT head; no prior MRI of the cervical spine, correlation is made with 02/07/2021 MRI thoracic spine FINDINGS: MRI HEAD FINDINGS Brain: No restricted diffusion to suggest acute or subacute infarct. No acute hemorrhage, mass, mass effect, or midline shift. Leptomeningeal enhancement in the anterior temporal lobes (series 20, images 23 and 22), posterior left temporal lobe (series 20, image 22), along the anterior surface of the midbrain (series 28, image 25) and pons  (series 30, image 12), and in the cerebellar folia (series 28, images 17-25). Enhancement of the medulla and brainstem are better seen on the cervical spine images (series 26, image 7). No hydrocephalus or extra-axial collection. Scattered T2 hyperintense signal in the periventricular white matter, likely the sequela of mild chronic small vessel ischemic disease. No cerebral edema. Vascular: Normal flow voids. Skull and upper cervical spine: In the midline/left parietal calvarium, there is a 10 x 5 x 8 mm mildly enhancing lesion (series 28, image 35 and series 30, image 14). Otherwise normal marrow signal. Sinuses/Orbits: No acute finding. Status post bilateral lens replacements. Other: Fluid throughout the right mastoid air cells. MRI CERVICAL SPINE FINDINGS Evaluation is somewhat limited by motion artifact. Alignment: Straightening of the normal cervical lordosis. No listhesis. Vertebrae: Abnormal T1 signal with patchy enhancement, for example in C2 and C6 consistent with known extensive osseous metastatic disease. No evidence of pathologic fracture. For findings in the thoracic spine, see 02/07/2021 MRI thoracic spine. Cord: Leptomeningeal enhancement along the brainstem and majority of the cervical spinal cord (series 26, image 7 and series 27, image 13). The spinal cord demonstrates increased T2 signal centrally from the level of C2 and extending inferiorly throughout the imaged spinal cord, although this is most prominent from the level of C5 extending inferiorly (series 25, image 7 and series 22, image 17). From C6 through C7-T1, there also appears to be prominence of the central canal of the spinal cord (series 22, images 21-27). Posterior Fossa, vertebral arteries, paraspinal tissues: For findings related to the pons and cerebellum, please see MRI brain findings above. Disc levels: Degenerative changes without significant spinal canal stenosis. IMPRESSION: MRI BRAIN: 1. Leptomeningeal enhancement along the  temporal lobes, cerebellum, and along the anterior surface of the midbrain and pons, concerning for leptomeningeal metastatic disease. 2. No abnormal parenchymal enhancement to suggest parenchymal metastatic disease. 3. Focus of enhancement in the left parietal calvarium is favored to be benign. Attention on follow-up. MRI CERVICAL SPINE 1. Leptomeningeal enhancement along the brainstem and the majority of the cervical spinal cord, with increased T2 signal in the cervical spinal cord, most likely edema. In addition, there may be prominence of the central canal from C6 through C7-T1. 2. Known osseous metastatic disease, without evidence of pathologic fracture. Electronically Signed   By: Merilyn Baba M.D.   On: 02/09/2021 14:28        Scheduled Meds:  anastrozole  1 mg Oral Daily   Chlorhexidine Gluconate Cloth  6 each Topical Daily   cholecalciferol  400 Units Oral Daily   dexamethasone (DECADRON) injection  4 mg Intravenous Q6H   feeding supplement  237 mL Oral BID BM   folic acid  0.5 mg Oral Daily   lidocaine  1 patch Transdermal Q24H   mirtazapine  7.5 mg Oral QHS   multivitamin with minerals  1 tablet Oral Daily   [START ON 02/12/2021] polyethylene glycol  17 g Oral Daily   [START ON 02/12/2021] senna-docusate  1 tablet Oral BID   vitamin E  400 Units Oral Daily   Continuous Infusions:     LOS: 4 days    Time spent: 40 minutes    Irine Seal, MD [facility]   To contact the attending provider between 7A-7P or the covering provider during after hours 7P-7A, please log into the web site [email or web address] and access using universal [place] password for that web site. If you do not have the password, please call the hospital operator.  02/11/2021, 11:23 AM

## 2021-02-11 NOTE — Progress Notes (Signed)
End of shift  Pt alert and oriented. Back pain decreasing with prn pain meds and lidocaine patch. Anticipate pt receiving outpatient radiation.  Pt could benefit from home health, she lives alone with a niece nearby and is "getting the feeling back" in her lower extremities.

## 2021-02-11 NOTE — Progress Notes (Signed)
Pt arrived to unit via wheel chair. Pt alert and oriented, able to transfer to bed with minimal assistance. Pt VSS. Belongings with pt. Pt oriented to room/floor. Placed on telemetry. Watched video as part of floor orientation, demonstrates understanding. Denies any further needs at this time.

## 2021-02-12 ENCOUNTER — Other Ambulatory Visit: Payer: Medicare Other

## 2021-02-12 ENCOUNTER — Ambulatory Visit
Admission: RE | Admit: 2021-02-12 | Discharge: 2021-02-12 | Disposition: A | Discharge status: Home / Self Care | Payer: Medicare Other | Source: Ambulatory Visit | Attending: Radiation Oncology | Admitting: Radiation Oncology

## 2021-02-12 DIAGNOSIS — R338 Other retention of urine: Secondary | ICD-10-CM | POA: Diagnosis not present

## 2021-02-12 DIAGNOSIS — C799 Secondary malignant neoplasm of unspecified site: Secondary | ICD-10-CM | POA: Diagnosis not present

## 2021-02-12 DIAGNOSIS — G834 Cauda equina syndrome: Secondary | ICD-10-CM | POA: Diagnosis not present

## 2021-02-12 DIAGNOSIS — I10 Essential (primary) hypertension: Secondary | ICD-10-CM | POA: Diagnosis not present

## 2021-02-12 LAB — RENAL FUNCTION PANEL
Albumin: 3.2 g/dL — ABNORMAL LOW (ref 3.5–5.0)
Anion gap: 7 (ref 5–15)
BUN: 29 mg/dL — ABNORMAL HIGH (ref 8–23)
CO2: 30 mmol/L (ref 22–32)
Calcium: 9 mg/dL (ref 8.9–10.3)
Chloride: 97 mmol/L — ABNORMAL LOW (ref 98–111)
Creatinine, Ser: 0.82 mg/dL (ref 0.44–1.00)
GFR, Estimated: >60 mL/min (ref >60)
Glucose, Bld: 191 mg/dL — ABNORMAL HIGH (ref 70–99)
Phosphorus: 2.8 mg/dL (ref 2.5–4.6)
Potassium: 4.7 mmol/L (ref 3.5–5.1)
Sodium: 134 mmol/L — ABNORMAL LOW (ref 135–145)

## 2021-02-12 LAB — CSF CULTURE W GRAM STAIN: Culture: NO GROWTH

## 2021-02-12 LAB — GLUCOSE, CAPILLARY: Glucose-Capillary: 209 mg/dL — ABNORMAL HIGH (ref 70–99)

## 2021-02-12 LAB — CBC
HCT: 29.9 % — ABNORMAL LOW (ref 36.0–46.0)
Hemoglobin: 9.3 g/dL — ABNORMAL LOW (ref 12.0–15.0)
MCH: 32.9 pg (ref 26.0–34.0)
MCHC: 31.1 g/dL (ref 30.0–36.0)
MCV: 105.7 fL — ABNORMAL HIGH (ref 80.0–100.0)
Platelets: 335 10*3/uL (ref 150–400)
RBC: 2.83 MIL/uL — ABNORMAL LOW (ref 3.87–5.11)
RDW: 20.4 % — ABNORMAL HIGH (ref 11.5–15.5)
WBC: 11.5 10*3/uL — ABNORMAL HIGH (ref 4.0–10.5)
nRBC: 11.8 % — ABNORMAL HIGH (ref 0.0–0.2)

## 2021-02-12 LAB — PREPARE RBC (CROSSMATCH)

## 2021-02-12 MED ORDER — DIPHENHYDRAMINE HCL 25 MG PO CAPS
25.0000 mg | ORAL_CAPSULE | Freq: Once | ORAL | Status: AC
  Administered 2021-02-12: 25 mg via ORAL
  Filled 2021-02-12: qty 1

## 2021-02-12 MED ORDER — SODIUM CHLORIDE 0.9% IV SOLUTION: Freq: Once | INTRAVENOUS | Status: AC

## 2021-02-12 MED ORDER — FUROSEMIDE 10 MG/ML IJ SOLN
20.0000 mg | Freq: Once | INTRAMUSCULAR | Status: AC
  Administered 2021-02-12: 20 mg via INTRAVENOUS
  Filled 2021-02-12: qty 2

## 2021-02-12 MED ORDER — ACETAMINOPHEN 325 MG PO TABS
650.0000 mg | ORAL_TABLET | Freq: Once | ORAL | Status: AC
  Administered 2021-02-12: 650 mg via ORAL
  Filled 2021-02-12: qty 2

## 2021-02-12 NOTE — Care Management Important Message (Signed)
Important Message  Patient Details IM Letter placed in Patients room. Name: Leslie [name] MRN: [id number] Date of Birth: 29-Jul-1945   Medicare Important Message Given:  Yes     [name] 02/12/2021, 1:41 PM

## 2021-02-12 NOTE — Progress Notes (Signed)
Physical Therapy Treatment Patient Details Name: Leslie [name] MRN: [id number] DOB: 1945/08/15 Today's Date: 02/12/2021   History of Present Illness Patient is a 76 year old female admitted with urinary retention, back pain with radiation shooting into the legs, 3 to 4-week history of saddle anesthesia. 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  Patient seen in consultation by neurosurgery, Dr. Ronnald Ramp who recommended lumbar puncture, no surgical intervention.   PMH recurrent breast cancer with mets to the bone currently on zoledronate, anastrozole,palbociclib, hypertension, hyperlipidemia    PT Comments    Pt agreeable to working with PT. She reports 7/10 pain in R groin/lower abdomen area. Gait is ataxic, R LE worse than L. Pt is at significant risk for falls when ambulating. She was fatigued after walk of ~70 feet. Will continue to follow and progress activity as tolerated. Highly recommend 24/7 care after discharge.    Recommendations for follow up therapy are one component of a multi-disciplinary discharge planning process, led by the attending physician.  Recommendations may be updated based on patient status, additional functional criteria and insurance authorization.  Follow Up Recommendations  Home health PT     Assistance Recommended at Discharge Frequent or constant Supervision/Assistance  Patient can return home with the following A little help with walking and/or transfers;A little help with bathing/dressing/bathroom;Assistance with cooking/housework;Help with stairs or ramp for entrance;Assist for transportation   Equipment Recommendations  Rolling walker (2 wheels)    Recommendations for Other Services       Precautions / Restrictions  Precautions Precautions: Fall Restrictions Weight Bearing Restrictions: No     Mobility  Bed Mobility Overal bed mobility: Needs Assistance Bed Mobility: Supine to Sit, Sit to Supine     Supine to sit: Supervision Sit to supine: Modified independent (Device/Increase time)   General bed mobility comments: Supv for lines only.    Transfers Overall transfer level: Needs assistance Equipment used: Rolling walker (2 wheels) Transfers: Sit to/from Stand Sit to Stand: Min assist           General transfer comment: Assist to steady. cues for safety, hand placement.    Ambulation/Gait Ambulation/Gait assistance: Min assist Gait Distance (Feet): 70 Feet Assistive device: Rolling walker (2 wheels) Gait Pattern/deviations: Step-through pattern, Narrow base of support, Ataxic, Decreased stride length       General Gait Details: Cues for safety, RW proximity, pacing. Gait is ataxic, R worse than L. Also noted flexed trunk and knee posturing towards end of distance. Significant fall risk when ambulating. Fatigued towards end of walk. Dyspnea 2/4.   Stairs             Wheelchair Mobility    Modified Rankin (Stroke Patients Only)       Balance Overall balance assessment: Needs assistance, History of Falls         Standing balance support: Bilateral upper extremity supported, During functional activity, Reliant on assistive device for balance Standing balance-Leahy Scale: Poor                              Cognition Arousal/Alertness: Awake/alert Behavior During Therapy: WFL for tasks assessed/performed Overall Cognitive Status: Within Functional Limits for tasks assessed  Exercises      General Comments        Pertinent Vitals/Pain Pain Assessment Pain Assessment: 0-10 Pain Score: 7  Pain Location: R groin/lower abdomen Pain Descriptors / Indicators: Grimacing, Discomfort Pain  Intervention(s): Limited activity within patient's tolerance, Monitored during session, Premedicated before session    Home Living                          Prior Function            PT Goals (current goals can now be found in the care plan section) Progress towards PT goals: Progressing toward goals    Frequency    Min 3X/week      PT Plan Current plan remains appropriate    Co-evaluation              AM-PAC PT "6 Clicks" Mobility   Outcome Measure  Help needed turning from your back to your side while in a flat bed without using bedrails?: None Help needed moving from lying on your back to sitting on the side of a flat bed without using bedrails?: None Help needed moving to and from a bed to a chair (including a wheelchair)?: A Little Help needed standing up from a chair using your arms (e.g., wheelchair or bedside chair)?: A Little Help needed to walk in hospital room?: A Little Help needed climbing 3-5 steps with a railing? : A Lot 6 Click Score: 19    End of Session Equipment Utilized During Treatment: Gait belt Activity Tolerance: Patient limited by fatigue Patient left: in bed;with call bell/phone within reach;with bed alarm set   PT Visit Diagnosis: History of falling (Z91.81);Difficulty in walking, not elsewhere classified (R26.2);Muscle weakness (generalized) (M62.81)     Time: 0768-0881 PT Time Calculation (min) (ACUTE ONLY): 14 min  Charges:  $Gait Training: 8-22 mins                         Doreatha Massed, PT Acute Rehabilitation  Office: [phone] Pager: [phone]

## 2021-02-12 NOTE — Progress Notes (Signed)
I spoke with the patient to let her know our discussion from brain oncology conference for either 2 weeks of focal radiation versus up to 5-6 weeks of craniospinal radiation given her csf findings and mri findings. after reviewing risks and benefits of each and alternative options of hospice care, she is in favor of craniospinal treatment understanding the intent, and is open to transfusion of PRBCs in lieu of side effects to the bone m arrow. We will simulate today.

## 2021-02-12 NOTE — Plan of Care (Signed)
  Problem: Education: Goal: Knowledge of General Education information will improve Description Including pain rating scale, medication(s)/side effects and non-pharmacologic comfort measures Outcome: Progressing   

## 2021-02-12 NOTE — Progress Notes (Addendum)
PROGRESS NOTE    Leslie [name]  ZOX:[id number] DOB: 31-Jan-1945 DOA: 02/07/2021 PCP: Mayra Neer, MD    Chief Complaint  Patient presents with   Urinary Retention    Brief Narrative:  Patient pleasant 76 year old unfortunate female, history of recurrent breast cancer with mets to the bone currently on zoledronate, anastrozole,palbociclib who was being followed by Dr. Jana Hakim who has retired, hypertension, hyperlipidemia presented to the ED with urinary retention for the past 2 to 3 days, some lower back pain with radiation shooting into the legs, 3 to 4-week history of saddle anesthesia.  Foley catheter placed with good urine output.  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  Patient seen in consultation by neurosurgery, Dr. Ronnald Ramp who recommended lumbar puncture, no surgical intervention at this time, further evaluation by oncology and radiation oncology.   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 -MRI of the L and T-spine concerning for multiple epidural tumors/disease in the lower T-spine with moderate stenosis at T10.  Findings concerning for mild cauda equina. -Patient seen in  consultation by neurosurgery, Dr. Ronnald Ramp who reviewed films and feels findings concerning for leptomeningeal spread of tumor with a very poor prognosis.  Concerned that patient may also have some spinal stenosis in the lumbar region causing the claudication-like symptoms, bladder dysfunction may be more related to edema within the conus down from compression at T10 per neurosurgery. -Neurosurgery recommended high-volume lumbar puncture for cytology to rule out leptomeningeal disease, oncology, radiation oncology consultations. -IR consulted for lumbar puncture which was done under fluoroscopy 02/08/2021.  LP studies with cytology showing atypical cells, cellularity of arachnoid/leptomeningeal cells and histiocytes cells with occasional atypical cells of undetermined significance. -Head CT with no acute abnormalities. 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  Per neuro oncology patient will be presented at brain/spine tumor board on 02/12/2021, feel patient may benefit from palliative radiation but limited options and recommending palliative care consultation which has been placed. 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  -Change IV Decadron to oral Decadron. -Radiation oncology following and assessing for palliative radiation and patient for simulation today Monday, 02/12/2021. -Radiation oncology requesting transfusion of packed red blood cells to aid in recovery of marrow as patient to have palliative radiation treatments. -Transfused 2 units packed red blood cells. -Neuro oncology/medical oncology/radiation oncology/neurosurgery following and appreciate  input and recommendations. -Palliative care consultation pending for goals of care  2.  Urinary retention -Likely secondary to problem #1. -Foley catheter placed in the ED after bladder scan  showed 700 cc of urinary retention with good urine output. -See #1. -Urine output of 580 cc over the past 24 hours. -Likely will be discharged home with Foley catheter in place. -May need outpatient follow-up with urology  3.  Hypokalemia -Repleted. -Potassium at 4.7  4.  Metastatic breast cancer -CT chest abdomen and pelvis with severe diffuse lytic and sclerotic metastatic bone disease, several areas of masslike enhancement in the muscles of the spine and pelvis worrisome for tumor involvement, no findings suspicious for pulmonary metastatic disease, bilateral pleural effusions left greater than right, no findings suspicious for abdominal/pelvic metastatic disease. 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  Known osseous metastatic disease. -Continue home regimen of Arimidex.   -Change IV Decadron to oral Decadron.   -Medical oncology consulted and following.  -Palliative care consultation pending.  5.  Hypertension -Patient states was on antihypertensive medications but PCP discontinued them per patient. -Blood pressure stable. -Follow.  6.  Hyperlipidemia -Stated was on a statin however held per her oncology recommendations. -Continue to hold statin. -Outpatient follow-up.  7.  Transaminitis -Likely secondary to possible volume overload in the setting of hypoalbuminemia. -CT abdomen and pelvis with no hepatic lesions to suggest  metastatic disease, gallbladder unremarkable, no intra or extrahepatic biliary dilatation. -Status post dose of IV Lasix with no significant change with transaminitis. -Outpatient follow-up.  8.  Anemia of chronic disease -Patient denies any overt bleeding. -Hemoglobin went as low as 7.0 from 8.3 (02/05/2021 ). -Status post transfusion 1 unit packed red blood cells hemoglobin currently at 9.3 this morning.  -Radiation oncology requesting transfusion of packed red blood cells to help with patient's bone marrow as patient to receive radiation treatments. -Follow H&H. -Transfusion threshold hemoglobin < 7.  9.  Constipation -Patient states no bowel movement in over a week prior to admission. -Patient received MiraLAX twice daily, Senokot-S twice daily, sorbitol p.o. x2 doses, Dulcolax suppository with good results. -Resume bowel regimen with MiraLAX and Senokot-S today. -Outpatient follow-up.  10.  Severe protein calorie malnutrition -Likely secondary to metastatic breast cancer. -Continue nutritional supplementation. -Status post IV albumin x1. -Albumin at 3.2 today.  11.  Debility -PT/OT. -May need home health therapies on discharge.       DVT prophylaxis: SCDs Code Status: DNR Family Communication: Updated patient.  No family at bedside. Disposition:   Status is: Inpatient  Remains inpatient appropriate because: Severity of illness       Consultants:  Neuro oncology 02/08/2021: Dr. Mickeal Skinner Medical oncology: Dr.Iruku 02/08/2021 Neurosurgery: Dr. Ronnald Ramp 02/07/2021 Radiation oncology: Dr. Henreitta Cea, PA 02/08/2021  Procedures:  CT head 02/08/2021 Chest x-ray 02/07/2021 MRI of the T and L-spine 02/07/2021 Lumbar puncture on the fluoroscopic guidance by IR: Dr. Earma [place] 02/08/2021 Transfusion 1 unit packed red blood cells 02/09/2021 MRI head MRI C-spine 02/09/2021 Transfusion 2 units packed red blood cells 02/12/2021     Antimicrobials:   None   Subjective: Sitting up in bed.  No chest pain.  No shortness of breath.  No abdominal pain.  Awaiting simulation today.    Objective: Vitals:   02/11/21 1400 02/11/21 2029 02/12/21 0430 02/12/21 0859  BP: (!) 117/59 140/65 132/71 130/60  Pulse: 78 80 78 73  Resp: 18 20 16    Temp: 98.2 F (36.8 C) 97.6 F (36.4 C) 97.9 F (36.6 C)   TempSrc: Oral     SpO2: 91% 95% 92%   Weight:        Intake/Output Summary (Last 24 hours) at 02/12/2021 5625 Last data filed at 02/12/2021 0830 Gross per 24 hour  Intake 360 ml  Output 580 ml  Net -220 ml    Filed Weights   02/08/21 0500 02/09/21 0400 02/11/21 0500  Weight: 45.5 kg 47.8 kg 46.7 kg    Examination:  General exam: NAD. Respiratory system: Lungs clear to auscultation bilaterally.  No wheezes, no crackles, no rhonchi.  Normal respiratory effort.  Speaking in full sentences.  Cardiovascular system: Regular rate rhythm no murmurs rubs or gallops.  No JVD.  No lower extremity edema.    Gastrointestinal system: Abdomen is soft, nontender, nondistended, positive bowel sounds.  No rebound.  No guarding.  Central nervous system: Alert and oriented. No focal neurological deficits. Extremities: Symmetric 5 x 5 power. Skin: No rashes, lesions or ulcers Psychiatry: Judgement and insight appear normal. Mood & affect appropriate.     Data Reviewed: I have personally reviewed following labs and imaging studies  CBC: Recent Labs  Lab 02/05/21 1405 02/07/21 1341 02/08/21 0237 02/09/21 0231 02/09/21 1750 02/10/21 0241 02/11/21 0241 02/12/21 0419  WBC 10.3 8.7 8.1 8.5 10.3 9.7 11.5* 11.5*  NEUTROABS 5.9 4.4 5.2 6.0  --   --  7.5  --   HGB 8.3* 7.6* 7.4* 7.0* 9.7* 8.6* 8.8* 9.3*  HCT 26.3* 24.8* 24.2* 23.1* 30.4* 27.2* 27.3* 29.9*  MCV 112.4* 112.2* 115.2* 112.7* 104.8* 105.0* 104.6* 105.7*  PLT 458* 401* 317 321 360 336 333 335     Basic Metabolic Panel: Recent Labs  Lab 02/08/21 0237 02/09/21 0231 02/10/21 0241  02/11/21 0241 02/12/21 0419  NA 136 135 136 135 134*  K 4.4 4.5 4.6 4.4 4.7  CL 102 104 101 97* 97*  CO2 24 23 28 30 30   GLUCOSE 152* 200* 180* 177* 191*  BUN 12 17 23  25* 29*  CREATININE 0.58 0.55 0.72 0.78 0.82  CALCIUM 9.0 8.5* 8.9 9.3 9.0  MG 2.2 2.3 2.5* 2.4  --   PHOS  --   --   --   --  2.8     GFR: Estimated Creatinine Clearance: 44.4 mL/min (by C-G formula based on SCr of 0.82 mg/dL).  Liver Function Tests: Recent Labs  Lab 02/07/21 1341 02/08/21 0237 02/09/21 0231 02/10/21 0241 02/11/21 0241 02/12/21 0419  AST 63* 56* 42* 50* 61*  --   ALT 51* 48* 43 50* 59*  --   ALKPHOS 153* 159* 137* 144* 141*  --   BILITOT 0.6 0.5 0.3 0.5 0.6  --   PROT 7.7 7.1 6.6 6.6 6.7  --   ALBUMIN 3.2* 2.8* 2.7* 2.7* 3.1* 3.2*     CBG: Recent Labs  Lab 02/09/21 0750 02/10/21 0752 02/10/21 1937 02/11/21 1032 02/12/21 0822  GLUCAP 168* 178* 218* 161* 209*      Recent Results (from the past 240 hour(s))  Resp Panel by RT-PCR (Flu A&B, Covid) Nasopharyngeal Swab     Status: None   Collection Time: 02/07/21  1:57 PM   Specimen: Nasopharyngeal Swab; Nasopharyngeal(NP) swabs in vial transport medium  Result Value Ref Range Status   SARS Coronavirus 2 by RT PCR NEGATIVE NEGATIVE Final    Comment: (NOTE) SARS-CoV-2 target nucleic acids are NOT DETECTED.  The  SARS-CoV-2 RNA is generally detectable in upper respiratory specimens during the acute phase of infection. The lowest concentration of SARS-CoV-2 viral copies this assay can detect is 138 copies/mL. A negative result does not preclude SARS-Cov-2 infection and should not be used as the sole basis for treatment or other patient management decisions. A negative result may occur with  improper specimen collection/handling, submission of specimen other than nasopharyngeal swab, presence of viral mutation(s) within the areas targeted by this assay, and inadequate number of viral copies(<138 copies/mL). A negative result  must be combined with clinical observations, patient history, and epidemiological information. The expected result is Negative.  Fact Sheet for Patients:  EntrepreneurPulse.com.au  Fact Sheet for Healthcare Providers:  IncredibleEmployment.be  This test is no t yet approved or cleared by the [place] FDA and  has been authorized for detection and/or diagnosis of SARS-CoV-2 by FDA under an Emergency Use Authorization (EUA). This EUA will remain  in effect (meaning this test can be used) for the duration of the COVID-19 declaration under Section 564(b)(1) of the Act, 21 U.S.C.section 360bbb-3(b)(1), unless the authorization is terminated  or revoked sooner.       Influenza A by PCR NEGATIVE NEGATIVE Final   Influenza B by PCR NEGATIVE NEGATIVE Final    Comment: (NOTE) The Xpert Xpress SARS-CoV-2/FLU/RSV plus assay is intended as an aid in the diagnosis of influenza from Nasopharyngeal swab specimens and should not be used as a sole basis for treatment. Nasal washings and aspirates are unacceptable for Xpert Xpress SARS-CoV-2/FLU/RSV testing.  Fact Sheet for Patients: EntrepreneurPulse.com.au  Fact Sheet for Healthcare Providers: IncredibleEmployment.be  This test is not yet approved or cleared by the [place] FDA and has been authorized for detection and/or diagnosis of SARS-CoV-2 by FDA under an Emergency Use Authorization (EUA). This EUA will remain in effect (meaning this test can be used) for the duration of the COVID-19 declaration under Section 564(b)(1) of the Act, 21 U.S.C. section 360bbb-3(b)(1), unless the authorization is terminated or revoked.  Performed at [facility], [place] [address] [place], [place] [address]   MRSA Next Gen by PCR, Nasal     Status: None   Collection Time: 02/07/21 10:35 PM   Specimen: Nasal Mucosa; Nasal Swab  Result Value Ref Range  Status   MRSA by PCR Next Gen NOT DETECTED NOT DETECTED Final    Comment: (NOTE) The GeneXpert MRSA Assay (FDA approved for NASAL specimens only), is one component of a comprehensive MRSA colonization surveillance program. It is not intended to diagnose MRSA infection nor to guide or monitor treatment for MRSA infections. Test performance is not FDA approved in patients less than 26 years old. Performed at [facility], [place] [address], [place], [place] [address]   CSF culture w Gram Stain     Status: None   Collection Time: 02/08/21  1:20 PM   Specimen: PATH Cytology CSF; Cerebrospinal Fluid  Result Value Ref Range Status   Specimen Description   Final    CSF Performed at [facility] [address], [place], [place] [address]    Special Requests   Final    NONE Performed at [facility], [place] [address] [place], [place] [address]    Gram Stain   Final    WBC PRESENT,BOTH PMN AND MONONUCLEAR NO ORGANISMS SEEN Gram Stain Report Called to,Read Back By and Verified With: R.PULEO, RN AT 0076 ON 01.19.23 BY N.Sweetie Giebler CYTOSPIN SMEAR Performed at [facility]  [place] [address] [place], [place] [address]    Culture   Final    NO GROWTH 3 DAYS Performed at [facility], [place] [address] [place],  [address]    Report Status 02/12/2021 FINAL  Final          Radiology Studies: No results found.      Scheduled Meds:  anastrozole  1 mg Oral Daily   Chlorhexidine Gluconate Cloth  6 each Topical Daily   cholecalciferol  400 Units Oral Daily   dexamethasone (DECADRON) injection  4 mg Intravenous Q6H   feeding supplement  237 mL Oral BID BM   folic acid  0.5 mg Oral Daily   lidocaine  1 patch Transdermal Q24H   mirtazapine  7.5 mg Oral QHS   multivitamin with minerals  1 tablet Oral Daily   polyethylene glycol  17 g Oral Daily   senna-docusate  1 tablet Oral BID   Vitamin E  400 Units  Oral Daily   Continuous Infusions:     LOS: 5 days    Time spent: 40 minutes    Irine Seal, MD [facility]   To contact the attending provider between 7A-7P or the covering provider during after hours 7P-7A, please log into the web site [email or web address] and access using universal [place] password for that web site. If you do not have the password, please call the hospital operator.  02/12/2021, 9:22 AM

## 2021-02-13 ENCOUNTER — Inpatient Hospital Stay: Payer: Medicare Other

## 2021-02-13 ENCOUNTER — Inpatient Hospital Stay: Payer: Medicare Other | Admitting: Hematology and Oncology

## 2021-02-13 ENCOUNTER — Other Ambulatory Visit: Payer: Self-pay | Admitting: Radiation Oncology

## 2021-02-13 DIAGNOSIS — I10 Essential (primary) hypertension: Secondary | ICD-10-CM | POA: Diagnosis not present

## 2021-02-13 DIAGNOSIS — C799 Secondary malignant neoplasm of unspecified site: Secondary | ICD-10-CM | POA: Diagnosis not present

## 2021-02-13 DIAGNOSIS — R338 Other retention of urine: Secondary | ICD-10-CM | POA: Diagnosis not present

## 2021-02-13 DIAGNOSIS — G834 Cauda equina syndrome: Secondary | ICD-10-CM | POA: Diagnosis not present

## 2021-02-13 DIAGNOSIS — Z170 Estrogen receptor positive status [ER+]: Secondary | ICD-10-CM

## 2021-02-13 LAB — TYPE AND SCREEN
ABO/RH(D): B POS
Antibody Screen: NEGATIVE
Unit division: 0
Unit division: 0
Unit division: 0

## 2021-02-13 LAB — BPAM RBC
Blood Product Expiration Date: 202302062359
Blood Product Expiration Date: 202302212359
Blood Product Expiration Date: 202302212359
ISSUE DATE / TIME: 202301201355
ISSUE DATE / TIME: 202301231252
ISSUE DATE / TIME: 202301231749
Unit Type and Rh: 7300
Unit Type and Rh: 7300
Unit Type and Rh: 7300

## 2021-02-13 LAB — CBC
HCT: 41.8 % (ref 36.0–46.0)
Hemoglobin: 13.6 g/dL (ref 12.0–15.0)
MCH: 32 pg (ref 26.0–34.0)
MCHC: 32.5 g/dL (ref 30.0–36.0)
MCV: 98.4 fL (ref 80.0–100.0)
Platelets: 342 10*3/uL (ref 150–400)
RBC: 4.25 MIL/uL (ref 3.87–5.11)
RDW: 21.9 % — ABNORMAL HIGH (ref 11.5–15.5)
WBC: 11.8 10*3/uL — ABNORMAL HIGH (ref 4.0–10.5)
nRBC: 15 % — ABNORMAL HIGH (ref 0.0–0.2)

## 2021-02-13 LAB — RENAL FUNCTION PANEL
Albumin: 3 g/dL — ABNORMAL LOW (ref 3.5–5.0)
Anion gap: 12 (ref 5–15)
BUN: 36 mg/dL — ABNORMAL HIGH (ref 8–23)
CO2: 30 mmol/L (ref 22–32)
Calcium: 8.7 mg/dL — ABNORMAL LOW (ref 8.9–10.3)
Chloride: 90 mmol/L — ABNORMAL LOW (ref 98–111)
Creatinine, Ser: 0.88 mg/dL (ref 0.44–1.00)
GFR, Estimated: >60 mL/min (ref >60)
Glucose, Bld: 374 mg/dL — ABNORMAL HIGH (ref 70–99)
Phosphorus: 3.8 mg/dL (ref 2.5–4.6)
Potassium: 4.4 mmol/L (ref 3.5–5.1)
Sodium: 132 mmol/L — ABNORMAL LOW (ref 135–145)

## 2021-02-13 LAB — GLUCOSE, CAPILLARY: Glucose-Capillary: 242 mg/dL — ABNORMAL HIGH (ref 70–99)

## 2021-02-13 MED ORDER — DEXAMETHASONE 4 MG PO TABS
4.0000 mg | ORAL_TABLET | Freq: Two times a day (BID) | ORAL | Status: DC
  Administered 2021-02-13 – 2021-02-19 (×13): 4 mg via ORAL
  Filled 2021-02-13 (×13): qty 1

## 2021-02-13 NOTE — Progress Notes (Signed)
Occupational Therapy Treatment Patient Details Name: Leslie [name] MRN: [id number] DOB: 20-May-1945 Today's Date: 02/13/2021   History of present illness Patient is a 76 year old female admitted with urinary retention, back pain with radiation shooting into the legs, 3 to 4-week history of saddle anesthesia. 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  Patient seen in consultation by neurosurgery, Dr. Ronnald Ramp who recommended lumbar puncture, no surgical intervention.   PMH recurrent breast cancer with mets to the bone currently on zoledronate, anastrozole,palbociclib, hypertension, hyperlipidemia   OT comments  Patient was noted to participate in standing for over 8 mins with occasional UE support with standing balance challenges to increase ability to participate in ADLs. Patient would continue to benefit from skilled OT services at this time while admitted and after d/c to address noted deficits in order to improve overall safety and independence in ADLs.     Recommendations for follow up therapy are one component of a multi-disciplinary discharge planning process, led by the attending physician.  Recommendations may be updated based on patient status, additional functional criteria and insurance authorization.    Follow Up Recommendations  Home health OT    Assistance Recommended at Discharge Intermittent Supervision/Assistance  Patient can return home with the following  Assistance with cooking/housework   Equipment Recommendations  Tub/shower seat    Recommendations for Other Services      Precautions / Restrictions Precautions Precautions: Fall Restrictions Weight Bearing Restrictions: No       Mobility Bed Mobility               General  bed mobility comments: patient was up in recliner    Transfers                         Balance Overall balance assessment: Needs assistance, History of Falls Sitting-balance support: Feet supported Sitting balance-Leahy Scale: Good     Standing balance support: Single extremity supported, No upper extremity supported, During functional activity Standing balance-Leahy Scale: Fair Standing balance comment: needed min guard for tasks                           ADL either performed or assessed with clinical judgement   ADL Overall ADL's : Needs assistance/impaired                                       General ADL Comments: patient was able to stand with occasional one UE support on RW for 8.5 mins on this date with education and balance challanges with min guard to complete task. patient was noted to have shakiness in BLE at end of task. patient practiced functional mobility in room with RW with min A to maintain standing balance with turns. Patient was educated on participation in time out of bed with upcoming treatments being important. patient verbalized understanding.    Extremity/Trunk Assessment              Vision       Perception     Praxis      Cognition Arousal/Alertness: Awake/alert Behavior During Therapy: WFL for tasks assessed/performed Overall Cognitive Status: Within Functional  Limits for tasks assessed                                          Exercises      Shoulder Instructions       General Comments      Pertinent Vitals/ Pain       Pain Assessment Pain Assessment: No/denies pain  Home Living                                          Prior Functioning/Environment              Frequency  Min 2X/week        Progress Toward Goals  OT Goals(current goals can now be found in the care plan section)  Progress towards OT goals: Progressing toward goals     Plan  Discharge plan remains appropriate    Co-evaluation                 AM-PAC OT "6 Clicks" Daily Activity     Outcome Measure   Help from another person eating meals?: None Help from another person taking care of personal grooming?: A Little Help from another person toileting, which includes using toliet, bedpan, or urinal?: A Little Help from another person bathing (including washing, rinsing, drying)?: A Little Help from another person to put on and taking off regular upper body clothing?: A Little Help from another person to put on and taking off regular lower body clothing?: A Little 6 Click Score: 19    End of Session Equipment Utilized During Treatment: Gait belt;Rolling walker (2 wheels)  OT Visit Diagnosis: Unsteadiness on feet (R26.81);Other abnormalities of gait and mobility (R26.89);History of falling (Z91.81)   Activity Tolerance Patient tolerated treatment well   Patient Left in chair;with call bell/phone within reach;with chair alarm set   Nurse Communication Mobility status        Time: 1023-1050 OT Time Calculation (min): 27 min  Charges: OT General Charges $OT Visit: 1 Visit OT Treatments $Therapeutic Activity: 23-37 mins  Jackelyn Poling OTR/L, MS Acute Rehabilitation Department Office# [phone] Pager# [phone]   Marcellina Millin 02/13/2021, 1:03 PM

## 2021-02-13 NOTE — Progress Notes (Addendum)
I spoke with the patient to let her know that Dr. Lisbeth Renshaw is planning on 2 weeks of radiation (10 fractions) rather than what I originally thought which was 5-6 weeks. She is very pleased to hear this. Her Hgb has come up quite nicely with 2 units of PRBCs. We will follow her CBC and diff during, at the conclusion, and 1 week post treatment to assess for any cytopenias. She will start treatment on Thursday this week, and will likely go home with a foley catheter the day or so after her first treatment. We will coordinate voiding trial toward the conclusion of radiation as well in our office. I spoke with Dr. Grandville Silos about  this as well as for continuation of Dexamethasone which we will taper after completing treatment.      Carola Rhine, PAC

## 2021-02-13 NOTE — Progress Notes (Signed)
PROGRESS NOTE    Leslie [name]  UUV:[id number] DOB: 30-Apr-1945 DOA: 02/07/2021 PCP: Mayra Neer, MD    Chief Complaint  Patient presents with   Urinary Retention    Brief Narrative:  Patient pleasant 76 year old unfortunate female, history of recurrent breast cancer with mets to the bone currently on zoledronate, anastrozole,palbociclib who was being followed by Dr. Jana Hakim who has retired, hypertension, hyperlipidemia presented to the ED with urinary retention for the past 2 to 3 days, some lower back pain with radiation shooting into the legs, 3 to 4-week history of saddle anesthesia.  Foley catheter placed with good urine output.  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  Patient seen in consultation by neurosurgery, Dr. Ronnald Ramp who recommended lumbar puncture, no surgical intervention at this time, further evaluation by oncology and radiation oncology.   Assessment & Plan:   Principal Problem:   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HCC) Active Problems:   Essential hypertension   Hyperlipidemia   Bone metastases (HCC)   Malignant neoplasm of upper-outer quadrant of left breast in female, estrogen receptor positive ([place])   Acute urinary retention   Protein-calorie malnutrition, severe   Constipation   Leptomeningeal disease   Metastatic malignant neoplasm ([place])  #1 concern for cauda equina syndrome/leptomeningeal spread -Patient presented with urinary retention, few weeks of saddle anesthesia, some intermittent low back pain with radiation to the lower extremities. -MRI of the L and T-spine concerning for multiple epidural tumors/disease in the lower T-spine with moderate stenosis at T10.  Findings concerning for mild cauda equina. -Patient seen in  consultation by neurosurgery, Dr. Ronnald Ramp who reviewed films and feels findings concerning for leptomeningeal spread of tumor with a very poor prognosis.  Concerned that patient may also have some spinal stenosis in the lumbar region causing the claudication-like symptoms, bladder dysfunction may be more related to edema within the conus down from compression at T10 per neurosurgery. -Neurosurgery recommended high-volume lumbar puncture for cytology to rule out leptomeningeal disease, oncology, radiation oncology consultations. -IR consulted for lumbar puncture which was done under fluoroscopy 02/08/2021.  LP studies with cytology showing atypical cells, cellularity of arachnoid/leptomeningeal cells and histiocytes cells with occasional atypical cells of undetermined significance. -Head CT with no acute abnormalities. 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  Per neuro oncology patient will be presented at brain/spine tumor board on 02/12/2021, feel patient may benefit from palliative radiation but limited options and recommending palliative care consultation which has been placed. 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  -Change IV Decadron to oral Decadron. -Radiation oncology following and assessing for palliative radiation and patient underwent simulation 02/12/2021.  -Radiation oncology requested transfusion of packed red blood cells to aid in recovery of marrow as patient to have palliative radiation treatments. -Status post transfusion 2 mornings packed red blood cells 02/12/2021.  -Neuro oncology/medical oncology/radiation oncology/neurosurgery  following and appreciate input and recommendations. -Palliative care following.   2.  Urinary retention -Likely secondary to problem #1. -Foley catheter placed in the ED after bladder scan showed  700 cc of urinary retention with good urine output. -See #1. -Urine output of 2.7 L cc over the past 24 hours. -Likely will be discharged home with Foley catheter in place. -Outpatient follow-up with oncology to determine voiding trial following initiation of radiation. -May need outpatient follow-up with urology  3.  Hypokalemia -Repleted. -Potassium at 4.4  4.  Metastatic breast cancer -CT chest abdomen and pelvis with severe diffuse lytic and sclerotic metastatic bone disease, several areas of masslike enhancement in the muscles of the spine and pelvis worrisome for tumor involvement, no findings suspicious for pulmonary metastatic disease, bilateral pleural effusions left greater than right, no findings suspicious for abdominal/pelvic metastatic disease. 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  Known osseous metastatic disease. -Continue home regimen of Arimidex.   -Change IV Decadron to oral Decadron 4 mg p.o. twice daily with outpatient follow-up with oncology for further taper.   -Medical oncology consulted and following.  -Palliative care following.    5.  Hypertension -Patient states was on antihypertensive medications but PCP discontinued them per patient. -Blood pressure stable. -Follow.  6.  Hyperlipidemia -Stated was on a statin however held per her oncology recommendations. -Continue to hold statin and likely deferral of  resumption per oncology. -Outpatient follow-up.  7.  Transaminitis -Likely secondary to possible volume overload in the setting of hypoalbuminemia. -CT abdomen and pelvis with no hepatic lesions to suggest metastatic disease, gallbladder unremarkable, no intra or extrahepatic biliary dilatation. -Status post dose of IV Lasix with no significant change with transaminitis. -Outpatient follow-up.  8.  Anemia of chronic disease -Patient denies any overt bleeding. -Hemoglobin went as low as 7.0 from 8.3 (02/05/2021 ). -Status post transfusion 3 unit packed red blood cells during this hospitalization with hemoglobin currently at 13.6.  -Radiation oncology requested transfusion of packed red blood cells to help with patient's bone marrow as patient to receive radiation treatments. -Follow H&H. -Transfusion threshold hemoglobin < 7.  9.  Constipation -Patient on presentation had not had a bowel movement in 1 week prior to admission.  -Patient received MiraLAX twice daily, Senokot-S twice daily, sorbitol p.o. x2 doses, Dulcolax suppository with good results. -Continue bowel regimen of MiraLAX, Senokot-S.   -Outpatient follow-up.   10.  Severe protein calorie malnutrition -Likely secondary to metastatic breast cancer. -Continue nutritional supplementation. -Status post IV albumin x1. -Albumin at 3.0 today.  11.  Debility -PT/OT. -On discharge will need home health therapies which have been arranged by TOC.        DVT prophylaxis: SCDs Code Status: DNR Family Communication: Updated patient.  No family at bedside. Disposition:   Status is: Inpatient  Remains inpatient appropriate because: Severity of illness       Consultants:  Neuro oncology 02/08/2021: Dr. Mickeal Skinner Medical oncology: Dr.Iruku 02/08/2021 Neurosurgery: Dr. Ronnald Ramp 02/07/2021 Radiation oncology: Dr. Henreitta Cea, PA 02/08/2021 Palliative care: Dr. Rowe Pavy 02/10/2021  Procedures:  CT head 02/08/2021 Chest x-ray  02/07/2021 MRI of the T and L-spine 02/07/2021 Lumbar puncture on the fluoroscopic guidance by IR: Dr. Earma [place] 02/08/2021 Transfusion 1 unit packed red blood cells 02/09/2021 MRI head MRI C-spine 02/09/2021 Transfusion 2 units packed red blood cells 02/12/2021     Antimicrobials:  None  Subjective: Sitting up in chair eating breakfast.  No chest pain.  No shortness of breath.  No abdominal pain.  Stated underwent simulation yesterday well.  States was told first radiation treatment will be on Thursday but not sure.  No bleeding.  Tolerated transfusion well yesterday.   Objective: Vitals:   02/12/21 2113 02/12/21 2315 02/13/21 0445 02/13/21 0845  BP: (!) 143/74 (!) 126/93 137/75 (!) 142/68  Pulse: 76 75 67 70  Resp: 18 18 18 16   Temp: 98.6 F (37 C) 98.8 F (37.1 C) 98.1 F (36.7 C) 97.9 F (36.6 C)  TempSrc: Oral   Oral  SpO2: 100% 99% 99% 100%  Weight:        Intake/Output Summary (Last 24 hours) at 02/13/2021 1102 Last data filed at 02/13/2021 0900 Gross per 24 hour  Intake 1884.5 ml  Output 2700 ml  Net -815.5 ml    Filed Weights   02/08/21 0500 02/09/21 0400 02/11/21 0500  Weight: 45.5 kg 47.8 kg 46.7 kg    Examination:  General exam: NAD. Respiratory system: CTA B.  No wheezes, no crackles, no rhonchi.  Normal respiratory effort.  Speaking in full sentences.  Cardiovascular system: RRR no murmurs rubs or gallops.  No JVD.  No lower extremity edema. Gastrointestinal system: Abdomen is soft, nontender, nondistended, positive bowel sounds.  No rebound.  No guarding.  Central nervous system: Alert and oriented. No focal neurological deficits. Extremities: Symmetric 5 x 5 power. Skin: No rashes, lesions or ulcers Psychiatry: Judgement and insight appear normal. Mood & affect appropriate.     Data Reviewed: I have personally reviewed following labs and imaging studies  CBC: Recent Labs  Lab 02/07/21 1341 02/08/21 0237 02/09/21 0231 02/09/21 1750  02/10/21 0241 02/11/21 0241 02/12/21 0419 02/13/21 0051  WBC 8.7 8.1 8.5 10.3 9.7 11.5* 11.5* 11.8*  NEUTROABS 4.4 5.2 6.0  --   --  7.5  --   --   HGB 7.6* 7.4* 7.0* 9.7* 8.6* 8.8* 9.3* 13.6  HCT 24.8* 24.2* 23.1* 30.4* 27.2* 27.3* 29.9* 41.8  MCV 112.2* 115.2* 112.7* 104.8* 105.0* 104.6* 105.7* 98.4  PLT 401* 317 321 360 336 333 335 342     Basic Metabolic Panel: Recent Labs  Lab 02/08/21 0237 02/09/21 0231 02/10/21 0241 02/11/21 0241 02/12/21 0419 02/13/21 0051  NA 136 135 136 135 134* 132*  K 4.4 4.5 4.6 4.4 4.7 4.4  CL 102 104 101 97* 97* 90*  CO2 24 23 28 30 30 30   GLUCOSE 152* 200* 180* 177* 191* 374*  BUN 12 17 23  25* 29* 36*  CREATININE 0.58 0.55 0.72 0.78 0.82 0.88  CALCIUM 9.0 8.5* 8.9 9.3 9.0 8.7*  MG 2.2 2.3 2.5* 2.4  --   --   PHOS  --   --   --   --  2.8 3.8     GFR: Estimated Creatinine Clearance: 41.3 mL/min (by C-G formula based on SCr of 0.88 mg/dL).  Liver Function Tests: Recent Labs  Lab 02/07/21 1341 02/08/21 0237 02/09/21 0231 02/10/21 0241 02/11/21 0241 02/12/21 0419 02/13/21 0051  AST 63* 56* 42* 50* 61*  --   --   ALT 51* 48* 43 50* 59*  --   --   ALKPHOS 153* 159* 137* 144* 141*  --   --   BILITOT 0.6 0.5 0.3 0.5 0.6  --   --   PROT 7.7 7.1 6.6 6.6 6.7  --   --   ALBUMIN 3.2* 2.8* 2.7* 2.7*  3.1* 3.2* 3.0*     CBG: Recent Labs  Lab 02/10/21 0752 02/10/21 1937 02/11/21 1032 02/12/21 0822 02/13/21 0806  GLUCAP 178* 218* 161* 209* 242*      Recent Results (from the past 240 hour(s))  Resp Panel by RT-PCR (Flu A&B, Covid) Nasopharyngeal Swab     Status: None   Collection Time: 02/07/21  1:57 PM   Specimen: Nasopharyngeal Swab; Nasopharyngeal(NP) swabs in vial transport medium  Result Value Ref Range Status   SARS Coronavirus 2 by RT PCR NEGATIVE NEGATIVE Final    Comment: (NOTE) SARS-CoV-2 target nucleic acids are NOT DETECTED.  The SARS-CoV-2 RNA is generally detectable in upper respiratory specimens during the  acute phase of infection. The lowest concentration of SARS-CoV-2 viral copies this assay can detect is 138 copies/mL. A negative result does not preclude SARS-Cov-2 infection and should not be used as the sole basis for treatment or other patient management decisions. A negative result may occur with  improper specimen collection/handling, submission of specimen other than nasopharyngeal swab, presence of viral mutation(s) within the areas targeted by this assay, and inadequate number of viral copies(<138 copies/mL). A negative result must be combined with clinical observations, patient history, and epidemiological information. The expected result is Negative.  Fact Sheet for Patients:  EntrepreneurPulse.com.au  Fact Sheet for Healthcare Providers:  IncredibleEmployment.be  This test is no t yet approved or cleared by the [place] FDA and  has been authorized for detection and/or diagnosis of SARS-CoV-2 by FDA under an Emergency Use Authorization (EUA). This EUA will remain  in effect (meaning this test can be used) for the duration of the COVID-19 declaration under Section 564(b)(1) of the Act, 21 U.S.C.section 360bbb-3(b)(1), unless the authorization is terminated  or revoked sooner.       Influenza A by PCR NEGATIVE NEGATIVE Final   Influenza B by PCR NEGATIVE NEGATIVE Final    Comment: (NOTE) The Xpert Xpress SARS-CoV-2/FLU/RSV plus assay is intended as an aid in the diagnosis of influenza from Nasopharyngeal swab specimens and should not be used as a sole basis for treatment. Nasal washings and aspirates are unacceptable for Xpert Xpress SARS-CoV-2/FLU/RSV testing.  Fact Sheet for Patients: EntrepreneurPulse.com.au  Fact Sheet for Healthcare Providers: IncredibleEmployment.be  This test is not yet approved or cleared by the [place] FDA and has been authorized for detection and/or  diagnosis of SARS-CoV-2 by FDA under an Emergency Use Authorization (EUA). This EUA will remain in effect (meaning this test can be used) for the duration of the COVID-19 declaration under Section 564(b)(1) of the Act, 21 U.S.C. section 360bbb-3(b)(1), unless the authorization is terminated or revoked.  Performed at [facility], [place] [address], [place], [place] [address]   MRSA Next Gen by PCR, Nasal     Status: None   Collection Time: 02/07/21 10:35 PM   Specimen: Nasal Mucosa; Nasal Swab  Result Value Ref Range Status   MRSA by PCR Next Gen NOT DETECTED NOT DETECTED Final    Comment: (NOTE) The GeneXpert MRSA Assay (FDA approved for NASAL specimens only), is one component of a comprehensive MRSA colonization surveillance program. It is not intended to diagnose MRSA infection nor to guide or monitor treatment for MRSA infections. Test performance is not FDA approved in patients less than 67 years old. Performed at [facility], [place] [address], [place], [place] [address]   CSF culture w Gram Stain     Status: None   Collection Time: 02/08/21  1:20 PM   Specimen: PATH Cytology CSF; Cerebrospinal Fluid  Result Value Ref Range Status   Specimen Description   Final    CSF Performed at [facility], [place] [address] [place], [place] [address]    Special Requests   Final    NONE Performed at [facility], [place] [address], [place], [place] [address]    Gram Stain   Final    WBC PRESENT,BOTH PMN AND MONONUCLEAR NO ORGANISMS SEEN Gram Stain Report Called to,Read Back By and Verified With: R.PULEO, RN AT 3419 ON 01.19.23 BY N.Glendell Fouse CYTOSPIN SMEAR Performed at [place] [address], [place], [place] [address]    Culture   Final    NO GROWTH 3 DAYS Performed at [facility], [place] [address] [place], [place] [address]    Report Status 02/12/2021 FINAL  Final           Radiology Studies: No results found.      Scheduled Meds:  anastrozole  1 mg Oral Daily   Chlorhexidine Gluconate Cloth  6 each Topical Daily   cholecalciferol  400 Units Oral Daily   dexamethasone  4 mg Oral Q12H   feeding supplement  237 mL Oral BID BM   folic acid  0.5 mg Oral Daily   lidocaine  1 patch Transdermal Q24H   mirtazapine  7.5 mg Oral QHS   multivitamin with minerals  1 tablet Oral Daily   polyethylene glycol  17 g Oral Daily   senna-docusate  1 tablet Oral BID   Vitamin E  400 Units Oral Daily   Continuous Infusions:     LOS: 6 days    Time spent: 40 minutes    Irine Seal, MD [facility]   To contact the attending provider between 7A-7P or the covering provider during after hours 7P-7A, please log into the web site [email or web address] and access using universal [place] password for that web site. If you do not have the password, please call the hospital operator.  02/13/2021, 11:02 AM

## 2021-02-13 NOTE — Progress Notes (Signed)
Inpatient Diabetes Program Recommendations  AACE/ADA: New Consensus Statement on Inpatient Glycemic Control (2015)  Target Ranges:  Prepandial:   less than 140 mg/dL      Peak postprandial:   less than 180 mg/dL (1-2 hours)      Critically ill patients:  140 - 180 mg/dL   Lab Results  Component Value Date   GLUCAP 242 (H) 02/13/2021    Review of Glycemic Control  Latest Reference Range & Units 02/12/21 08:22 02/13/21 08:06  Glucose-Capillary 70 - 99 mg/dL 209 (H) 242 (H)  (H): Data is abnormally high   Inpatient Diabetes Program Recommendations:    If appropriate please consider Glycemic control order set using Novolog 0-9 units TID  Will continue to follow while inpatient.  Thank you, Reche Dixon, MSN, RN Diabetes Coordinator Inpatient Diabetes Program [phone] (team pager from 8a-5p)

## 2021-02-14 DIAGNOSIS — R338 Other retention of urine: Secondary | ICD-10-CM | POA: Diagnosis not present

## 2021-02-14 DIAGNOSIS — G834 Cauda equina syndrome: Secondary | ICD-10-CM | POA: Diagnosis not present

## 2021-02-14 DIAGNOSIS — C50412 Malignant neoplasm of upper-outer quadrant of left female breast: Secondary | ICD-10-CM | POA: Diagnosis not present

## 2021-02-14 DIAGNOSIS — D72829 Elevated white blood cell count, unspecified: Secondary | ICD-10-CM | POA: Diagnosis not present

## 2021-02-14 DIAGNOSIS — G96198 Other disorders of meninges, not elsewhere classified: Secondary | ICD-10-CM | POA: Diagnosis not present

## 2021-02-14 LAB — CBC
HCT: 41.9 % (ref 36.0–46.0)
Hemoglobin: 13.6 g/dL (ref 12.0–15.0)
MCH: 32.2 pg (ref 26.0–34.0)
MCHC: 32.5 g/dL (ref 30.0–36.0)
MCV: 99.3 fL (ref 80.0–100.0)
Platelets: 282 10*3/uL (ref 150–400)
RBC: 4.22 MIL/uL (ref 3.87–5.11)
RDW: 20.9 % — ABNORMAL HIGH (ref 11.5–15.5)
WBC: 12.2 10*3/uL — ABNORMAL HIGH (ref 4.0–10.5)
nRBC: 10.1 % — ABNORMAL HIGH (ref 0.0–0.2)

## 2021-02-14 LAB — RENAL FUNCTION PANEL
Albumin: 3 g/dL — ABNORMAL LOW (ref 3.5–5.0)
Anion gap: 10 (ref 5–15)
BUN: 35 mg/dL — ABNORMAL HIGH (ref 8–23)
CO2: 31 mmol/L (ref 22–32)
Calcium: 8.9 mg/dL (ref 8.9–10.3)
Chloride: 93 mmol/L — ABNORMAL LOW (ref 98–111)
Creatinine, Ser: 0.75 mg/dL (ref 0.44–1.00)
GFR, Estimated: >60 mL/min (ref >60)
Glucose, Bld: 194 mg/dL — ABNORMAL HIGH (ref 70–99)
Phosphorus: 3.6 mg/dL (ref 2.5–4.6)
Potassium: 4.7 mmol/L (ref 3.5–5.1)
Sodium: 134 mmol/L — ABNORMAL LOW (ref 135–145)

## 2021-02-14 LAB — GLUCOSE, CAPILLARY: Glucose-Capillary: 151 mg/dL — ABNORMAL HIGH (ref 70–99)

## 2021-02-14 NOTE — Assessment & Plan Note (Signed)
Nutrition Status: Nutrition Problem: Severe Malnutrition Etiology: chronic illness, cancer and cancer related treatments Signs/Symptoms: severe fat depletion, severe muscle depletion Interventions: Ensure Enlive (each supplement provides 350kcal and 20 grams of protein)

## 2021-02-14 NOTE — Progress Notes (Addendum)
Physical Therapy Treatment Patient Details Name: Leslie [name] MRN: [id number] DOB: 01-Jul-1945 Today's Date: 02/14/2021   History of Present Illness Patient is a 76 year old female admitted with urinary retention, back pain with radiation shooting into the legs, 3 to 4-week history of saddle anesthesia. 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  Patient seen in consultation by neurosurgery, Dr. Ronnald Ramp who recommended lumbar puncture, no surgical intervention.   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 She ambulated 78' with RW and min A for balance, and verbal cues to widen her base of support. Pt reports numbness and tingling with light touch to B feet, and absent sensation to light touch at bilateral thighs. Noted plan to start radiation tomorrow.     Recommendations for follow up therapy are one component of a multi-disciplinary discharge planning process, led by the attending physician.  Recommendations may be updated based on patient status, additional functional criteria and insurance authorization.  Follow Up Recommendations  Home health PT     Assistance Recommended at Discharge Intermittent Supervision/Assistance  Patient can return home with the following A little help with walking and/or transfers;A little help with bathing/dressing/bathroom;Assistance with cooking/housework;Help with stairs or ramp for entrance;Assist for transportation   Equipment Recommendations  Rolling walker (2 wheels)    Recommendations for Other Services        Precautions / Restrictions Precautions Precautions: Fall Restrictions Weight Bearing Restrictions: No     Mobility  Bed Mobility Overal bed mobility: Modified Independent Bed Mobility: Supine to Sit, Sit to Supine     Supine to sit: Modified independent (Device/Increase time) Sit to supine: Modified independent (Device/Increase time)   General bed mobility comments: used bedrail    Transfers Overall transfer level: Needs assistance Equipment used: Rolling walker (2 wheels) Transfers: Sit to/from Stand Sit to Stand: Min assist           General transfer comment: Assist to steady. cues for safety, hand placement.    Ambulation/Gait Ambulation/Gait assistance: Min assist Gait Distance (Feet): 80 Feet Assistive device: Rolling walker (2 wheels) Gait Pattern/deviations: Step-through pattern, Narrow base of support, Ataxic, Decreased stride length Gait velocity: decr     General Gait Details: pronounced pronation of R foot during stance phase, narrow base of support but can actively widen it with onging verbal cues to do so, trunk/neck flexion towards end of walk as pt fatigued   Stairs             Wheelchair Mobility    Modified Rankin (Stroke Patients Only)       Balance Overall balance assessment: Needs assistance, History of Falls Sitting-balance support: Feet supported Sitting balance-Leahy Scale: Good     Standing balance support: During functional activity, Bilateral upper extremity supported Standing balance-Leahy Scale: Fair Standing balance comment: BUE support for dynamic standing balance                            Cognition Arousal/Alertness: Awake/alert Behavior During Therapy: WFL for tasks assessed/performed Overall Cognitive Status: Within Functional Limits for tasks  assessed                                          Exercises      General Comments        Pertinent Vitals/Pain Pain  Assessment Pain Score: 7  Pain Location: R groin/lower abdomen Pain Descriptors / Indicators: Grimacing, Discomfort Pain Intervention(s): Limited activity within patient's tolerance, Monitored during session, Patient requesting pain meds-RN notified, Repositioned    Home Living                          Prior Function            PT Goals (current goals can now be found in the care plan section) Acute Rehab PT Goals Patient Stated Goal: likes to travel to play slot machines PT Goal Formulation: With patient Time For Goal Achievement: 02/22/21 Potential to Achieve Goals: Good    Frequency    Min 3X/week      PT Plan Current plan remains appropriate    Co-evaluation              AM-PAC PT "6 Clicks" Mobility   Outcome Measure  Help needed turning from your back to your side while in a flat bed without using bedrails?: None Help needed moving from lying on your back to sitting on the side of a flat bed without using bedrails?: None Help needed moving to and from a bed to a chair (including a wheelchair)?: A Little Help needed standing up from a chair using your arms (e.g., wheelchair or bedside chair)?: A Little Help needed to walk in hospital room?: A Little Help needed climbing 3-5 steps with a railing? : A Lot 6 Click Score: 19    End of Session Equipment Utilized During Treatment: Gait belt Activity Tolerance: Patient limited by fatigue Patient left: in bed;with call bell/phone within reach;with bed alarm set Nurse Communication: Mobility status PT Visit Diagnosis: History of falling (Z91.81);Difficulty in walking, not elsewhere classified (R26.2);Muscle weakness (generalized) (M62.81);Unsteadiness on feet (R26.81);Pain Pain - Right/Left: Right Pain - part of body:  (groin)     Time: 6720-9470 PT Time Calculation (min) (ACUTE ONLY): 16 min  Charges:  $Gait Training: 8-22 mins                     Blondell Reveal Kistler PT 02/14/2021   Acute Rehabilitation Services Pager [phone] Office [phone]

## 2021-02-14 NOTE — Assessment & Plan Note (Addendum)
Initially on antihypertensives, but as per patient, PCP discontinued these.  Blood pressure has been trending upward with systolic in the 202B.  Not much improved after little diuresis.

## 2021-02-14 NOTE — Assessment & Plan Note (Signed)
Statin held as per oncology recommendations and transaminitis.  Would continue to hold and can resume if appropriate in the future as per oncology.

## 2021-02-14 NOTE — Assessment & Plan Note (Signed)
Continue bowel regimen of MiraLAX and Senokot plus as needed sorbitol.

## 2021-02-14 NOTE — Hospital Course (Addendum)
76 year old female with past medical history of recurrent breast cancer as well as hypertension and hyperlipidemia presented to the emergency room on 1/18 with complaints of urinary retention times several days as well as lower back pain with a 3 to 4-week history of saddle anesthesia.  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  Case discussed with neurosurgery recommended lumbar puncture but no surgical intervention at this time.  Patient met with radiation oncology and after discussion of options, chose 2 weeks of craniospinal radiation, with initial treatment started on 1/26.

## 2021-02-14 NOTE — Assessment & Plan Note (Addendum)
Appreciate radiation oncology intervention.  Craniospinal radiation started 1/26.  Continue steroids with plans to taper following completion of 2-week radiation course.  Patient still feels very worn out.  Next radiation session scheduled for Monday, 1/30.  Given new catheter and frailty, for patient's son who is out of town currently to stay with patient.  Patient's son came back on Sunday night.  Patient be discharged today in the care of her son.  Continue steroids until radiation complete and patient follows up with radiation oncology.

## 2021-02-14 NOTE — Assessment & Plan Note (Signed)
Mild, secondary to steroids.  At this time, no evidence of infection.

## 2021-02-14 NOTE — Progress Notes (Signed)
Nutrition Follow-up  DOCUMENTATION CODES:   Severe malnutrition in context of chronic illness  INTERVENTION:  - continue Ensure Plus High Protein BID.   NUTRITION DIAGNOSIS:   Severe Malnutrition related to chronic illness, cancer and cancer related treatments as evidenced by severe fat depletion, severe muscle depletion. -ongoing  GOAL:   Patient will meet greater than or equal to 90% of their needs -minimally met on average  MONITOR:   PO intake, Supplement acceptance, Labs, Weight trends   ASSESSMENT:   76 year old female with medical history of recurrent L breast cancer with mets to the bone currently on zoledronate, anastrozole, palbociclib, HTN, HLD, PAD, and depression. She presented to the ED due to urinary retention x2-3 days, some lower back pain with radiation shooting into the legs, 3 to 4-week hx of saddle anesthesia. Foley catheter placed with good urine output.  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 Neurosurgery consulted and recommended lumbar puncture and no surgical intervention at this time.  Patient has been eating 50-100% at all meals since breakfast on 1/20 and has been accepting Ensure supplements 90-95% of the time offered.   Weight today is +4 lb compared to weight on 1/22 and +7 lb compared to weight on 1/19 (admission).   No information documented in the edema section of flow sheet this hospitalization. She is noted to be -2.4 L compared to admission.   Per review of notes, plan is for 2 weeks (10 dates) of radiation rather than the originally planned 5-6 weeks. Plan for first XRT to be on 02/15/21.   Labs reviewed; CBGs: 151 mg/dl, Na: 134 mmol/l, Cl: 93 mmol/l, BUN: 35 mg/dl.  Medications reviewed; 400 units  cholecalciferol/day, 0.5 mg folvite/day, 1 tablet multivitamin with minerals/day, 17 g miralax/day, 1 tablet senokot BID, 400 units vitamin E/day.    Diet Order:   Diet Order             Diet regular Room service appropriate? Yes; Fluid consistency: Thin  Diet effective now                   EDUCATION NEEDS:   Education needs have been addressed  Skin:  Skin Assessment: Reviewed RN Assessment  Last BM:  1/23 (type 6)  Height:   Ht Readings from Last 1 Encounters:  01/04/21 5' 0.75" (1.543 m)    Weight:   Wt Readings from Last 1 Encounters:  02/14/21 48.6 kg     Estimated Nutritional Needs:  Kcal:  1600-1850 kcal Protein:  80-95 grams Fluid:  >/= 1.8 L/day     Jarome Matin, MS, RD, LDN Inpatient Clinical Dietitian RD pager # available in [place]  After hours/weekend pager # available in [facility]

## 2021-02-14 NOTE — Assessment & Plan Note (Signed)
Seen by medical and radiation oncology.  CT of abdomen and pelvis and chest note pleural effusions and diffuse metastatic bone disease as well as concerns for possible metastases in the spinal muscles, but no findings suspicious for any abdominal/pelvic metastatic disease.

## 2021-02-14 NOTE — Progress Notes (Signed)
[facility] Progress Note ° °Patient: Leslie [name]    MRN:[id number]  DOA: 02/07/2021    °Date of Service: the patient was seen and examined on 02/14/2021 ° °Brief hospital course: °75-year-old female with past medical history of recurrent breast cancer as well as hypertension and hyperlipidemia presented to the emergency room on 1/18 with complaints of urinary retention times several days as well as lower back pain with a 3 to 4-week history of saddle anesthesia.  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  Case discussed with neurosurgery recommended lumbar puncture but no surgical intervention at this time.  Patient met with radiation oncology and after discussion of options, chose to 2 weeks of craniospinal radiation, with initial treatment to be started on 1/26. 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  Craniospinal radiation to began 1/26.  Continue steroids with plans to taper following completion of 2-week radiation course. ° °Malignant neoplasm of upper-outer quadrant of left breast in female, estrogen receptor positive (HCC) °Seen by medical and radiation oncology.  CT of abdomen and pelvis and chest note pleural effusions and diffuse metastatic bone disease as well as concerns for possible metastases in the spinal muscles, but no findings suspicious for any abdominal/pelvic metastatic disease. ° °Protein-calorie malnutrition, severe- (present on admission) °Nutrition Status: °Nutrition Problem: Severe Malnutrition °Etiology: chronic illness, cancer and cancer related treatments °Signs/Symptoms: severe fat depletion, severe muscle depletion °Interventions: Ensure Enlive (each supplement provides 350kcal and 20 grams of  protein) ° ° ° ° °Essential hypertension- (present on admission) °Initially on antihypertensives, but as per patient, PCP discontinued these.  Blood pressure has been trending upward with systolic in the 150s.  We will start mild diuretic and follow. ° °Constipation- (present on admission) °Continue bowel regimen of MiraLAX and Senokot plus as needed sorbitol. ° °Hyperlipidemia- (present on admission) °Statin held as per oncology recommendations and transaminitis.  Would continue to hold and can resume if appropriate in the future as per oncology. ° °Leukocytosis °Mild, secondary to steroids.  At this time, no evidence of infection. ° °Acute urinary retention- (present on admission) °Secondary to neural impingement from metastases to spine.  Status post Foley catheter placement.  Patient will likely go home with Foley catheter.  Will place on suppressive antibiotics. ° ° ° °Body mass index is 20.42 kg/m².  °Nutrition Problem: Severe Malnutrition °Etiology: chronic illness, cancer and cancer related treatments °   ° °Consultants: °Radiation oncology °Medical oncology °Case discussed with neurosurgery ° °Procedures: °Planned radiation treatment 1/26 ° °Antimicrobials: °None ° °Code Status: Full code ° ° °Subjective: Patient doing well with no complaints ° °Objective: °Blood pressure starting to trend upwards, otherwise vital signs stable °Vitals:  ° 02/13/21 2016 02/14/21 0518  °BP: (!) 145/71 (!) 151/79  °Pulse: 74 76  °Resp: 18 18  °Temp: 98.7 °F (37.1 °C) 98.3 °F (36.8 °C)  °SpO2: 100% (!) 87%  ° ° °Intake/Output Summary (Last 24 hours) at 02/14/2021 1413 °Last data filed at 02/14/2021 0850 °Gross per 24 hour  °Intake --  °Output 300 ml  °Net -300 ml  ° °Filed Weights  ° 02/09/21 0400 02/11/21 0500 02/14/21 0634  °Weight: 47.8 kg 46.7 kg 48.6 kg  ° °Body mass index is 20.42 kg/m². ° °Exam: ° °General: Alert and oriented x3, no acute distress °  HEENT: Normocephalic and atraumatic, mucous membranes are  moist °Cardiovascular: Regular rate and rhythm, S1-S2 °Respiratory: Clear to auscultation bilaterally °Abdomen: Soft, nontender, nondistended, positive bowel sounds °Musculoskeletal: No clubbing or cyanosis, trace pitting edema °Skin: No skin breaks, tears or lesions °Psychiatry: Appropriate, no evidence of psychoses °Neurology: No focal deficits ° °Data Reviewed: °My review of labs, imaging, notes and other tests shows no new significant findings.  ° °Disposition:  °Status is: Inpatient ° °Remains inpatient appropriate because: Starting radiation therapy ° ° °  ° °Family Communication: Declined for me to call any family °DVT Prophylaxis: °Place and maintain sequential compression device Start: 02/12/21 1522 °SCDs Start: 02/07/21 1832 ° ° ° °Author: °Sendil K Krishnan ,MD °02/14/2021 2:13 PM ° °To reach On-call, see care teams to locate the attending and reach out via [email or web address]. °Between 7PM-7AM, please contact night-coverage °If you still have difficulty reaching the attending provider, please page the [facility] (Director on Call) for [facility] on amion for assistance. ° °

## 2021-02-14 NOTE — Assessment & Plan Note (Signed)
Secondary to neural impingement from metastases to spine.  Status post Foley catheter placement.  Patient will likely go home with Foley catheter.  Will place on suppressive antibiotics.

## 2021-02-15 ENCOUNTER — Ambulatory Visit
Admit: 2021-02-15 | Discharge: 2021-02-15 | Disposition: A | Discharge status: Home / Self Care | Payer: Medicare Other | Attending: Radiation Oncology | Admitting: Radiation Oncology

## 2021-02-15 DIAGNOSIS — C50412 Malignant neoplasm of upper-outer quadrant of left female breast: Secondary | ICD-10-CM | POA: Diagnosis not present

## 2021-02-15 DIAGNOSIS — E43 Unspecified severe protein-calorie malnutrition: Secondary | ICD-10-CM

## 2021-02-15 DIAGNOSIS — R338 Other retention of urine: Secondary | ICD-10-CM

## 2021-02-15 DIAGNOSIS — I10 Essential (primary) hypertension: Secondary | ICD-10-CM

## 2021-02-15 DIAGNOSIS — G834 Cauda equina syndrome: Secondary | ICD-10-CM | POA: Diagnosis not present

## 2021-02-15 DIAGNOSIS — Z170 Estrogen receptor positive status [ER+]: Secondary | ICD-10-CM

## 2021-02-15 LAB — BASIC METABOLIC PANEL
Anion gap: 9 (ref 5–15)
BUN: 38 mg/dL — ABNORMAL HIGH (ref 8–23)
CO2: 31 mmol/L (ref 22–32)
Calcium: 8.9 mg/dL (ref 8.9–10.3)
Chloride: 93 mmol/L — ABNORMAL LOW (ref 98–111)
Creatinine, Ser: 0.65 mg/dL (ref 0.44–1.00)
GFR, Estimated: >60 mL/min (ref >60)
Glucose, Bld: 243 mg/dL — ABNORMAL HIGH (ref 70–99)
Potassium: 4.5 mmol/L (ref 3.5–5.1)
Sodium: 133 mmol/L — ABNORMAL LOW (ref 135–145)

## 2021-02-15 LAB — CBC
HCT: 42.1 % (ref 36.0–46.0)
Hemoglobin: 13.9 g/dL (ref 12.0–15.0)
MCH: 32.7 pg (ref 26.0–34.0)
MCHC: 33 g/dL (ref 30.0–36.0)
MCV: 99.1 fL (ref 80.0–100.0)
Platelets: 269 10*3/uL (ref 150–400)
RBC: 4.25 MIL/uL (ref 3.87–5.11)
RDW: 20.2 % — ABNORMAL HIGH (ref 11.5–15.5)
WBC: 12.6 10*3/uL — ABNORMAL HIGH (ref 4.0–10.5)
nRBC: 2.3 % — ABNORMAL HIGH (ref 0.0–0.2)

## 2021-02-15 LAB — GLUCOSE, CAPILLARY: Glucose-Capillary: 220 mg/dL — ABNORMAL HIGH (ref 70–99)

## 2021-02-15 MED ORDER — NITROFURANTOIN MONOHYD MACRO 100 MG PO CAPS
100.0000 mg | ORAL_CAPSULE | Freq: Every day | ORAL | Status: DC
  Administered 2021-02-15 – 2021-02-18 (×4): 100 mg via ORAL
  Filled 2021-02-15 (×5): qty 1

## 2021-02-15 NOTE — Care Management Important Message (Signed)
Important Message  Patient Details IM Letter given to the Patient. Name: Leslie [name] MRN: [id number] Date of Birth: September 19, 1945   Medicare Important Message Given:  Yes     [name] 02/15/2021, 1:49 PM

## 2021-02-15 NOTE — Plan of Care (Signed)
  Problem: Health Behavior/Discharge Planning: Goal: Ability to manage health-related needs will improve Outcome: Progressing   

## 2021-02-15 NOTE — Progress Notes (Signed)
[facility] Progress Note  Patient: Leslie [name]    GYB:[id number]  DOA: 02/07/2021    Date of Service: the patient was seen and examined on 02/15/2021  Brief hospital course: 76 year old female with past medical history of recurrent breast cancer as well as hypertension and hyperlipidemia presented to the emergency room on 1/18 with complaints of urinary retention times several days as well as lower back pain with a 3 to 4-week history of saddle anesthesia.  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  Case discussed with neurosurgery recommended lumbar puncture but no surgical intervention at this time.  Patient met with radiation oncology and after discussion of options, chose to 2 weeks of craniospinal radiation, with initial treatment to be started on 1/26.  Assessment and Plan: * 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[place])- (present on admission) Appreciate radiation oncology intervention.  Craniospinal radiation to began 1/26.  Continue steroids with plans to taper following completion of 2-week radiation course.  Malignant neoplasm of upper-outer quadrant of left breast in female, estrogen receptor positive ([place]) Seen by medical and radiation oncology.  CT of abdomen and pelvis and chest note pleural effusions and diffuse metastatic bone disease as well as concerns for possible metastases in the spinal muscles, but no findings suspicious for any abdominal/pelvic metastatic disease.  Protein-calorie malnutrition, severe- (present on admission) Nutrition Status: Nutrition Problem: Severe Malnutrition Etiology: chronic illness, cancer and cancer related treatments Signs/Symptoms: severe fat depletion, severe muscle depletion Interventions: Ensure Enlive (each supplement provides 350kcal and 20 grams of  protein)     Essential hypertension- (present on admission) Initially on antihypertensives, but as per patient, PCP discontinued these.  Blood pressure has been trending upward with systolic in the 876O.  We will start mild diuretic and follow.  Constipation- (present on admission) Continue bowel regimen of MiraLAX and Senokot plus as needed sorbitol.  Hyperlipidemia- (present on admission) Statin held as per oncology recommendations and transaminitis.  Would continue to hold and can resume if appropriate in the future as per oncology.  Leukocytosis Mild, secondary to steroids.  At this time, no evidence of infection.  Acute urinary retention- (present on admission) Secondary to neural impingement from metastases to spine.  Status post Foley catheter placement.  Patient will likely go home with Foley catheter.  Will place on suppressive antibiotics.    Body mass index is 20.48 kg/m.  Nutrition Problem: Severe Malnutrition Etiology: chronic illness, cancer and cancer related treatments     Consultants: Radiation oncology Medical oncology Case discussed with neurosurgery  Procedures: Planned radiation treatment 1/26  Antimicrobials: Macrobid 100 mg daily  Code Status: Full code   Subjective: Patient doing well with no complaints.  Patient feels a little anxious about radiation today  Objective: Blood pressure starting to trend upwards, otherwise vital signs stable Vitals:   02/15/21 0450 02/15/21 1416  BP: (!) 149/81 138/66  Pulse: 80 80  Resp: 18 16  Temp: 97.7 F (36.5 C) 98.1 F (36.7 C)  SpO2: (!) 89% 100%    Intake/Output Summary (Last 24 hours) at 02/15/2021 1434 Last data filed at 02/15/2021 1236 Gross per 24 hour  Intake --  Output 551 ml  Net -551 ml    Filed Weights   02/11/21 0500 02/14/21 0634 02/15/21 0652  Weight: 46.7 kg 48.6 kg 48.8 kg   Body mass index is 20.48  kg/m.  Exam:  General: Alert and oriented x3, no acute  distress HEENT: Normocephalic and atraumatic, mucous membranes are moist Cardiovascular: Regular rate and rhythm, S1-S2 Respiratory: Clear to auscultation bilaterally Abdomen: Soft, nontender, nondistended, positive bowel sounds Musculoskeletal: No clubbing or cyanosis, trace pitting edema Skin: No skin breaks, tears or lesions Psychiatry: Appropriate, no evidence of psychoses Neurology: No focal deficits  Data Reviewed: My review of labs, imaging, notes and other tests shows no new significant findings.   Disposition:  Status is: Inpatient  Remains inpatient appropriate because: Starting radiation therapy      Family Communication: Declined for me to call any family DVT Prophylaxis: Place and maintain sequential compression device Start: 02/12/21 1522 SCDs Start: 02/07/21 1832    Author: Annita Brod ,MD 02/15/2021 2:34 PM  To reach On-call, see care teams to locate the attending and reach out via [email or web address]. Between 7PM-7AM, please contact night-coverage If you still have difficulty reaching the attending provider, please page the [facility] (Director on Call) for [facility] on amion for assistance.

## 2021-02-15 NOTE — Progress Notes (Signed)
°   02/15/21 1200  Mobility  Activity Stood at bedside;Transferred to/from [facility]  Level of Assistance Contact guard assist, steadying assist  Assistive Device Front wheel walker  Activity Response Tolerated well  $Mobility charge 1 Mobility   Pt agreeable to mobilize this morning. Had pt stand at bedside to begin session as she stated she was feeling "wobbly" this morning. When pt stood, noticed stool actively coming out of her rectum. Brought over [facility] and has pt sit on it. Pt stated she did not feel anything coming out of her rectum. Changed pt linens and gown secondary to the stool. Left pt on [facility], notified RN of pt location and lack of feeling during BM.   [place] Specialist Acute Rehab Services Office: [phone]

## 2021-02-16 ENCOUNTER — Ambulatory Visit
Admit: 2021-02-16 | Discharge: 2021-02-16 | Disposition: A | Discharge status: Home / Self Care | Payer: Medicare Other | Attending: Radiation Oncology | Admitting: Radiation Oncology

## 2021-02-16 DIAGNOSIS — G96198 Other disorders of meninges, not elsewhere classified: Secondary | ICD-10-CM

## 2021-02-16 DIAGNOSIS — G834 Cauda equina syndrome: Secondary | ICD-10-CM | POA: Diagnosis not present

## 2021-02-16 DIAGNOSIS — I10 Essential (primary) hypertension: Secondary | ICD-10-CM | POA: Diagnosis not present

## 2021-02-16 DIAGNOSIS — R338 Other retention of urine: Secondary | ICD-10-CM | POA: Diagnosis not present

## 2021-02-16 LAB — GLUCOSE, CAPILLARY: Glucose-Capillary: 198 mg/dL — ABNORMAL HIGH (ref 70–99)

## 2021-02-16 NOTE — Plan of Care (Signed)
Problem: Education: Goal: Knowledge of General Education information will improve Description: Including pain rating scale, medication(s)/side effects and non-pharmacologic comfort measures Outcome: Progressing   Problem: Clinical Measurements: Goal: Ability to maintain clinical measurements within normal limits will improve Outcome: Progressing Goal: Will remain free from infection Outcome: Progressing Goal: Diagnostic test results will improve Outcome: Progressing Goal: Respiratory complications will improve Outcome: Progressing Goal: Cardiovascular complication will be avoided Outcome: Completed/Met   Problem: Activity: Goal: Risk for activity intolerance will decrease Outcome: Progressing   Problem: Nutrition: Goal: Adequate nutrition will be maintained Outcome: Progressing   Problem: Coping: Goal: Level of anxiety will decrease Outcome: Progressing   Problem: Elimination: Goal: Will not experience complications related to bowel motility Outcome: Progressing   Problem: Pain Managment: Goal: General experience of comfort will improve Outcome: Progressing   Problem: Safety: Goal: Ability to remain free from injury will improve Outcome: Progressing   Problem: Skin Integrity: Goal: Risk for impaired skin integrity will decrease Outcome: Progressing

## 2021-02-16 NOTE — Progress Notes (Signed)
PT Cancellation Note  Patient Details Name: Leslie [name] MRN: [id number] DOB: 11/29/1945   Cancelled Treatment:    Reason Eval/Treat Not Completed: Fatigue/lethargy limiting ability to participate Pt reports being very fatigued due to radiation.  Pt would like to be seen before radiation treatment if possible.  Pt politely declines today.    [name] 02/16/2021, 3:02 PM Arlyce Dice, DPT Acute Rehabilitation Services Pager: [phone] Office: [phone]

## 2021-02-16 NOTE — Progress Notes (Signed)
[facility] Progress Note  Patient: Leslie [name]    OVZ:[id number]  DOA: 02/07/2021    Date of Service: the patient was seen and examined on 02/16/2021  Brief hospital course: 76 year old female with past medical history of recurrent breast cancer as well as hypertension and hyperlipidemia presented to the emergency room on 1/18 with complaints of urinary retention times several days as well as lower back pain with a 3 to 4-week history of saddle anesthesia.  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  Case discussed with neurosurgery recommended lumbar puncture but no surgical intervention at this time.  Patient met with radiation oncology and after discussion of options, chose 2 weeks of craniospinal radiation, with initial treatment started on 1/26.  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  Craniospinal radiation started 1/26 with second dose today.  Continue steroids with plans to taper following completion of 2-week radiation course.  Patient still feels a little weak, will continue to monitor in the hospital  Malignant neoplasm of upper-outer quadrant of left breast in female, estrogen receptor positive ([place]) Seen by medical and radiation oncology.  CT of abdomen and pelvis and chest note pleural effusions and diffuse metastatic bone disease as well as concerns for possible metastases in the spinal muscles, but no findings suspicious for any abdominal/pelvic metastatic disease.  Protein-calorie malnutrition, severe- (present on admission) Nutrition Status: Nutrition Problem: Severe Malnutrition Etiology: chronic illness, cancer and cancer related treatments Signs/Symptoms: severe fat depletion, severe muscle  depletion Interventions: Ensure Enlive (each supplement provides 350kcal and 20 grams of protein)     Essential hypertension- (present on admission) Initially on antihypertensives, but as per patient, PCP discontinued these.  Blood pressure has been trending upward with systolic in the 412I.  We will start mild diuretic and follow.  Constipation- (present on admission) Continue bowel regimen of MiraLAX and Senokot plus as needed sorbitol.  Hyperlipidemia- (present on admission) Statin held as per oncology recommendations and transaminitis.  Would continue to hold and can resume if appropriate in the future as per oncology.  Leukocytosis Mild, secondary to steroids.  At this time, no evidence of infection.  Acute urinary retention- (present on admission) Secondary to neural impingement from metastases to spine.  Status post Foley catheter placement.  Patient will likely go home with Foley catheter.  Will place on suppressive antibiotics.    Body mass index is 19.53 kg/m.  Nutrition Problem: Severe Malnutrition Etiology: chronic illness, cancer and cancer related treatments     Consultants: Radiation oncology Medical oncology Case discussed with neurosurgery  Procedures: Planned radiation treatment 1/26  Antimicrobials: Macrobid 100 mg daily  Code Status: Full code   Subjective: Patient seen this morning before 2nd dose of radiation.  Feels worn out.  Objective: Blood pressure starting to trend upwards, otherwise vital signs stable Vitals:   02/15/21 2054 02/16/21 0422  BP: (!) 146/66 (!) 149/75  Pulse: 91 77  Resp: 17 17  Temp: 98.8 F (37.1 C) 97.7 F (36.5 C)  SpO2: 93% 94%    Intake/Output Summary (Last 24 hours) at 02/16/2021 1302 Last data filed at 02/16/2021 0152 Gross per 24 hour  Intake 340 ml  Output 1075 ml  Net -735 ml    Filed Weights   02/14/21 0634 02/15/21 0652 02/16/21 0428  Weight:  48.6 kg 48.8 kg 46.5 kg   Body mass index is 19.53  kg/m.  Exam:  General: Alert and oriented x3, fatigued. HEENT: Normocephalic and atraumatic, mucous membranes are moist Cardiovascular: Regular rate and rhythm, S1-S2 Respiratory: Clear to auscultation bilaterally Abdomen: Soft, nontender, nondistended, positive bowel sounds Musculoskeletal: No clubbing or cyanosis, trace pitting edema Skin: No skin breaks, tears or lesions Psychiatry: Appropriate, no evidence of psychoses Neurology: No focal deficits  Data Reviewed: My review of labs, imaging, notes and other tests shows no new significant findings.   Disposition:  Status is: Inpatient  Remains inpatient appropriate because: Monitoring of radiation therapy tolerance      Family Communication: Declined for me to call any family DVT Prophylaxis: Place and maintain sequential compression device Start: 02/12/21 1522 SCDs Start: 02/07/21 1832    Author: Annita Brod ,MD 02/16/2021 1:02 PM  To reach On-call, see care teams to locate the attending and reach out via [email or web address]. Between 7PM-7AM, please contact night-coverage If you still have difficulty reaching the attending provider, please page the [facility] (Director on Call) for [facility] on amion for assistance.

## 2021-02-17 LAB — CBC
HCT: 43.2 % (ref 36.0–46.0)
Hemoglobin: 13.6 g/dL (ref 12.0–15.0)
MCH: 31.9 pg (ref 26.0–34.0)
MCHC: 31.5 g/dL (ref 30.0–36.0)
MCV: 101.2 fL — ABNORMAL HIGH (ref 80.0–100.0)
Platelets: 223 10*3/uL (ref 150–400)
RBC: 4.27 MIL/uL (ref 3.87–5.11)
RDW: 19.1 % — ABNORMAL HIGH (ref 11.5–15.5)
WBC: 8.8 10*3/uL (ref 4.0–10.5)
nRBC: 0.3 % — ABNORMAL HIGH (ref 0.0–0.2)

## 2021-02-17 LAB — BASIC METABOLIC PANEL
Anion gap: 9 (ref 5–15)
BUN: 28 mg/dL — ABNORMAL HIGH (ref 8–23)
CO2: 29 mmol/L (ref 22–32)
Calcium: 8.9 mg/dL (ref 8.9–10.3)
Chloride: 93 mmol/L — ABNORMAL LOW (ref 98–111)
Creatinine, Ser: 0.64 mg/dL (ref 0.44–1.00)
GFR, Estimated: >60 mL/min (ref >60)
Glucose, Bld: 202 mg/dL — ABNORMAL HIGH (ref 70–99)
Potassium: 4.8 mmol/L (ref 3.5–5.1)
Sodium: 131 mmol/L — ABNORMAL LOW (ref 135–145)

## 2021-02-17 LAB — GLUCOSE, CAPILLARY: Glucose-Capillary: 178 mg/dL — ABNORMAL HIGH (ref 70–99)

## 2021-02-17 MED ORDER — ALUM & MAG HYDROXIDE-SIMETH 200-200-20 MG/5ML PO SUSP
15.0000 mL | Freq: Four times a day (QID) | ORAL | Status: DC | PRN
  Administered 2021-02-17: 15 mL via ORAL
  Filled 2021-02-17: qty 30

## 2021-02-17 NOTE — TOC Progression Note (Addendum)
Transition of Care [facility]) - Progression Note    Patient Details  Name: Leslie [name] MRN: [id number] Date of Birth: Mar 24, 1945  Transition of Care [facility]) CM/SW Contact  Ross Ludwig, [place] Phone Number: 02/17/2021, 5:47 PM  Clinical Narrative:    Patient is set up with [facility] PT with [facility], equipment has been delivered.  CSW to continue to follow patient's progress throughout discharge planning.   Expected Discharge Plan: [place] Barriers to Discharge: Continued Medical Work up  Expected Discharge Plan and Services Expected Discharge Plan: [place] In-house Referral: Clinical Social Work   Post Acute Care Choice: Durable Medical Equipment, Home Health Living arrangements for the past 2 months: Single Family Home                 DME Arranged: Walker rolling DME Agency: [employer] Date DME Agency Contacted: 02/09/21 Time DME Agency Contacted: 1212 Representative spoke with at DME Agency: [place]: PT, OT [place] Agency: [place] ([place]) Date [place]: 02/08/21   Representative spoke with at [place]: [place] ([place]) Interventions    Readmission Risk Interventions No flowsheet data found.

## 2021-02-17 NOTE — Progress Notes (Signed)
Occupational Therapy Treatment Patient Details Name: Leslie [name] MRN: [id number] DOB: September 25, 1945 Today's Date: 02/17/2021   History of present illness Patient is a 76 year old female admitted with urinary retention, back pain with radiation shooting into the legs, 3 to 4-week history of saddle anesthesia. 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  Patient seen in consultation by neurosurgery, Dr. Ronnald Ramp who recommended lumbar puncture, no surgical intervention.   PMH recurrent breast cancer with mets to the bone currently on zoledronate, anastrozole,palbociclib, hypertension, hyperlipidemia   OT comments  Treatment focused on toileting and functional mobility. Continues to need min assist for safety with walker. Improved gait when able to see her legx.   Recommendations for follow up therapy are one component of a multi-disciplinary discharge planning process, led by the attending physician.  Recommendations may be updated based on patient status, additional functional criteria and insurance authorization.    Follow Up Recommendations  Home health OT    Assistance Recommended at Discharge Intermittent Supervision/Assistance  Patient can return home with the following  Assistance with cooking/housework;A little help with walking and/or transfers   Equipment Recommendations  Tub/shower seat    Recommendations for Other Services      Precautions / Restrictions Precautions Precautions: Fall Restrictions Weight Bearing Restrictions: No        ADL either performed or assessed with clinical judgement   ADL Overall ADL's : Needs assistance/impaired                         Toilet Transfer: Minimal assistance;Rolling walker (2 wheels) Toilet  Transfer Details (indicate cue type and reason): Ambulated to bathroom with RW and min assist for safety due to ataxic gait. Min guard for transfer on to toilet. Toileting- Clothing Manipulation and Hygiene: Min guard;Sitting/lateral lean Toileting - Clothing Manipulation Details (indicate cue type and reason): Able to wipe herself and manage gown.       General ADL Comments: Patient supine in bed when therapist entered the room and agreeable to treatment. She continues to exhibit ataxic gait and requires min assist for safety and steadying. Able to perfomr toileting in seated position. ON return to bed - therapist pulled hospital gown taut and patient able to see LEs better and gait improved. Patient returned to bed for rest with supervision.      Cognition Arousal/Alertness: Awake/alert Behavior During Therapy: WFL for tasks assessed/performed Overall Cognitive Status: Within Functional Limits for tasks assessed                                                     Pertinent Vitals/ Pain       Pain Assessment Pain Assessment: No/denies pain   Frequency  Min 2X/week        Progress Toward Goals  OT Goals(current goals can now be found in the care plan section)  Progress towards OT goals: Progressing toward goals  Acute Rehab OT Goals OT Goal Formulation: With patient Time For Goal Achievement: 02/22/21 Potential to Achieve Goals: Good  Plan Discharge plan remains appropriate    Co-evaluation  OT goals addressed during session: ADL's and self-care      AM-PAC OT "6 Clicks" Daily Activity     Outcome Measure   Help from another person eating meals?: None Help from another person taking care of personal grooming?: A Little Help from another person toileting, which includes using toliet, bedpan, or urinal?: A Little Help from another person bathing (including washing, rinsing, drying)?: A Little Help from another person to put on and  taking off regular upper body clothing?: A Little Help from another person to put on and taking off regular lower body clothing?: A Little 6 Click Score: 19    End of Session Equipment Utilized During Treatment: Rolling walker (2 wheels);Gait belt  OT Visit Diagnosis: Unsteadiness on feet (R26.81);Other abnormalities of gait and mobility (R26.89);History of falling (Z91.81)   Activity Tolerance Patient tolerated treatment well   Patient Left in bed;with call bell/phone within reach;with bed alarm set   Nurse Communication Mobility status        Time: 0037-0488 OT Time Calculation (min): 18 min  Charges: OT General Charges $OT Visit: 1 Visit OT Treatments $Self Care/Home Management : 8-22 mins  Derl Barrow, OTR/L [place]  Office [phone] Pager: [place] 02/17/2021, 1:07 PM

## 2021-02-17 NOTE — Progress Notes (Signed)
[facility] Progress Note  Patient: Leslie [name]    [id number]  DOA: 02/07/2021    Date of Service: the patient was seen and examined on 02/17/2021  Brief hospital course: 76 year old female with past medical history of recurrent breast cancer as well as hypertension and hyperlipidemia presented to the emergency room on 1/18 with complaints of urinary retention times several days as well as lower back pain with a 3 to 4-week history of saddle anesthesia.  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  Case discussed with neurosurgery recommended lumbar puncture but no surgical intervention at this time.  Patient met with radiation oncology and after discussion of options, chose 2 weeks of craniospinal radiation, with initial treatment started on 1/26.  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  Craniospinal radiation started 1/26.  Continue steroids with plans to taper following completion of 2-week radiation course.  Patient still feels very worn out.  Next radiation session scheduled for Monday, 1/30.  Malignant neoplasm of upper-outer quadrant of left breast in female, estrogen receptor positive ([place]) Seen by medical and radiation oncology.  CT of abdomen and pelvis and chest note pleural effusions and diffuse metastatic bone disease as well as concerns for possible metastases in the spinal muscles, but no findings suspicious for any abdominal/pelvic metastatic disease.  Protein-calorie malnutrition, severe- (present on admission) Nutrition Status: Nutrition Problem: Severe Malnutrition Etiology: chronic illness, cancer and cancer related treatments Signs/Symptoms: severe fat depletion, severe muscle  depletion Interventions: Ensure Enlive (each supplement provides 350kcal and 20 grams of protein)     Essential hypertension- (present on admission) Initially on antihypertensives, but as per patient, PCP discontinued these.  Blood pressure has been trending upward with systolic in the 944H.  We will start mild diuretic and follow.  Constipation- (present on admission) Continue bowel regimen of MiraLAX and Senokot plus as needed sorbitol.  Hyperlipidemia- (present on admission) Statin held as per oncology recommendations and transaminitis.  Would continue to hold and can resume if appropriate in the future as per oncology.  Leukocytosis Mild, secondary to steroids.  At this time, no evidence of infection.  Acute urinary retention- (present on admission) Secondary to neural impingement from metastases to spine.  Status post Foley catheter placement.  Patient will likely go home with Foley catheter.  Will place on suppressive antibiotics.    Body mass index is 17.61 kg/m.  Nutrition Problem: Severe Malnutrition Etiology: chronic illness, cancer and cancer related treatments     Consultants: Radiation oncology Medical oncology Case discussed with neurosurgery  Procedures: Ongoing radiation treatments, and accession 1/30  Antimicrobials: Macrobid 100 mg daily  Code Status: Full code   Subjective: Patient complains of significant fatigue, weakness  Objective: Vital signs stable Vitals:   02/17/21 0955 02/17/21 1200  BP: 139/65 117/61  Pulse: 74 80  Resp: 18 16  Temp: 98 F (36.7 C) 98.1 F (36.7 C)  SpO2: 100% 93%    Intake/Output Summary (Last 24 hours) at 02/17/2021 1257 Last data filed at 02/17/2021 1135 Gross per 24 hour  Intake 840 ml  Output 1675 ml  Net -835 ml    Filed Weights   02/15/21 0652 02/16/21 0428 02/17/21 0428  Weight: 48.8 kg 46.5 kg 46.5 kg   Body mass index is 17.61 kg/m.  Exam:  General: Alert and oriented x3, fatigued. HEENT:  Normocephalic and atraumatic, mucous membranes are moist Cardiovascular: Regular rate and rhythm, S1-S2 Respiratory: Clear to auscultation bilaterally Abdomen: Soft, nontender, nondistended, positive bowel sounds Musculoskeletal: No clubbing or cyanosis, trace pitting edema Skin: No skin breaks, tears or lesions Psychiatry: Appropriate, no evidence of psychoses Neurology: No focal deficits  Data Reviewed: My review of labs, imaging, notes and other tests shows no new significant findings.   Disposition:  Status is: Inpatient  Remains inpatient appropriate because: Weakness and fatigue.  Anticipate discharge tomorrow     Family Communication: Declined for me to call any family DVT Prophylaxis: Place and maintain sequential compression device Start: 02/12/21 1522 SCDs Start: 02/07/21 1832    Author: Annita Brod ,MD 02/17/2021 12:57 PM  To reach On-call, see care teams to locate the attending and reach out via [email or web address]. Between 7PM-7AM, please contact night-coverage If you still have difficulty reaching the attending provider, please page the [facility] (Director on Call) for [facility] on amion for assistance.

## 2021-02-18 LAB — GLUCOSE, CAPILLARY: Glucose-Capillary: 196 mg/dL — ABNORMAL HIGH (ref 70–99)

## 2021-02-18 NOTE — Progress Notes (Signed)
[facility] Progress Note  Patient: Leslie [name]    JAS:[id number]  DOA: 02/07/2021    Date of Service: the patient was seen and examined on 02/18/2021  Brief hospital course: 76 year old female with past medical history of recurrent breast cancer as well as hypertension and hyperlipidemia presented to the emergency room on 1/18 with complaints of urinary retention times several days as well as lower back pain with a 3 to 4-week history of saddle anesthesia.  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  Case discussed with neurosurgery recommended lumbar puncture but no surgical intervention at this time.  Patient met with radiation oncology and after discussion of options, chose 2 weeks of craniospinal radiation, with initial treatment started on 1/26.  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  Craniospinal radiation started 1/26.  Continue steroids with plans to taper following completion of 2-week radiation course.  Patient still feels very worn out.  Next radiation session scheduled for Monday, 1/30.  Given new catheter and frailty, for patient's son who is out of town currently to stay with patient.  Patient's son is driving back from out of town and will be back in [place] later tonight.  We will plan to discharge patient tomorrow after radiation in the care of her son.  Malignant neoplasm of upper-outer quadrant of left breast in female, estrogen receptor positive ([place]) Seen by medical and radiation oncology.  CT of abdomen and pelvis and chest note pleural effusions and diffuse metastatic bone disease as well as concerns for possible metastases in the spinal muscles, but no findings suspicious for any abdominal/pelvic  metastatic disease.  Protein-calorie malnutrition, severe- (present on admission) Nutrition Status: Nutrition Problem: Severe Malnutrition Etiology: chronic illness, cancer and cancer related treatments Signs/Symptoms: severe fat depletion, severe muscle depletion Interventions: Ensure Enlive (each supplement provides 350kcal and 20 grams of protein)     Essential hypertension- (present on admission) Initially on antihypertensives, but as per patient, PCP discontinued these.  Blood pressure has been trending upward with systolic in the 419F.  We will start mild diuretic and follow.  Constipation- (present on admission) Continue bowel regimen of MiraLAX and Senokot plus as needed sorbitol.  Hyperlipidemia- (present on admission) Statin held as per oncology recommendations and transaminitis.  Would continue to hold and can resume if appropriate in the future as per oncology.  Leukocytosis Mild, secondary to steroids.  At this time, no evidence of infection.  Acute urinary retention- (present on admission) Secondary to neural impingement from metastases to spine.  Status post Foley catheter placement.  Patient will likely go home with Foley catheter.  Will place on suppressive antibiotics.    Body mass index is 18.33 kg/m.  Nutrition Problem: Severe Malnutrition Etiology: chronic illness, cancer and cancer related treatments     Consultants: Radiation oncology Medical oncology Case discussed with neurosurgery  Procedures: Ongoing radiation treatments, and accession 1/30  Antimicrobials: Macrobid 100 mg daily  Code Status: Full code   Subjective: Patient somewhat tired although better than previous day since she did not have radiation yesterday.  Objective: Vital signs stable Vitals:   02/18/21 1328 02/18/21 1526  BP: 129/72 (!) 161/78  Pulse: 84 94  Resp: (!) 21 18  Temp: 97.9 F (36.6 C) 98.6 F (37 C)  SpO2: 94% 96%  Intake/Output Summary (Last 24  hours) at 02/18/2021 1527 Last data filed at 02/18/2021 1247 Gross per 24 hour  Intake 460 ml  Output 700 ml  Net -240 ml    Filed Weights   02/16/21 0428 02/17/21 0428 02/18/21 0500  Weight: 46.5 kg 46.5 kg 48.4 kg   Body mass index is 18.33 kg/m.  Exam:  General: Alert and oriented x3, fatigued. HEENT: Normocephalic and atraumatic, mucous membranes are moist Cardiovascular: Regular rate and rhythm, S1-S2 Respiratory: Clear to auscultation bilaterally Abdomen: Soft, nontender, nondistended, positive bowel sounds Musculoskeletal: No clubbing or cyanosis, trace pitting edema Skin: No skin breaks, tears or lesions Psychiatry: Appropriate, no evidence of psychoses Neurology: No focal deficits  Data Reviewed: My review of labs, imaging, notes and other tests shows no new significant findings.   Disposition:  Status is: Inpatient  Remains inpatient appropriate because: Patient's son who patient will be discharged in the care of his back in [place] late tonight, from out of town.     Family Communication: Discussed with patient's son by phone DVT Prophylaxis: Place and maintain sequential compression device Start: 02/12/21 1522 SCDs Start: 02/07/21 1832    Author: Annita Brod ,MD 02/18/2021 3:27 PM  To reach On-call, see care teams to locate the attending and reach out via [email or web address]. Between 7PM-7AM, please contact night-coverage If you still have difficulty reaching the attending provider, please page the [facility] (Director on Call) for [facility] on amion for assistance.

## 2021-02-18 NOTE — Progress Notes (Addendum)
Physical Therapy Treatment Patient Details Name: Leslie [name] MRN: [id number] DOB: 07-Jun-1945 Today's Date: 02/18/2021   History of Present Illness Patient is a 76 year old female admitted with urinary retention, back pain with radiation shooting into the legs, 3 to 4-week history of saddle anesthesia. 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  Patient seen in consultation by neurosurgery, Dr. Ronnald Ramp who recommended lumbar puncture, no surgical intervention.   PMH recurrent breast cancer with mets to the bone currently on zoledronate, anastrozole,palbociclib, hypertension, hyperlipidemia    PT Comments    High fall risk! Near fall during first few feet of ambulation on today. Heavy reliance on UEs to compensate for weakness and impaired sensation in bil LEs. Pt reported both feet feeling numb on today. Highly recommend 24/7 care at home. Pt reports her d/c plan is to return home with son assisting as needed.    Recommendations for follow up therapy are one component of a multi-disciplinary discharge planning process, led by the attending physician.  Recommendations may be updated based on patient status, additional functional criteria and insurance authorization.  Follow Up Recommendations  Home health PT     Assistance Recommended at Discharge Frequent or constant Supervision/Assistance  Patient can return home with the following Help with stairs or ramp for entrance;A lot of help with walking and/or transfers;A lot of help with bathing/dressing/bathroom;Assist for transportation;Assistance with cooking/housework   Equipment Recommendations       Recommendations for Other Services       Precautions / Restrictions Precautions Precautions:  Fall Restrictions Weight Bearing Restrictions: No     Mobility  Bed Mobility Overal bed mobility: Modified Independent         Sit to supine: Modified independent (Device/Increase time)   General bed mobility comments: used bedrail    Transfers Overall transfer level: Needs assistance Equipment used: Rolling walker (2 wheels) Transfers: Sit to/from Stand Sit to Stand: Min assist           General transfer comment: Pt could not rise from recliner without assistance. Assist to power up, stabilize, control descent. Cues for safety, technique,  hand/feet placement. High fall risk.    Ambulation/Gait Ambulation/Gait assistance: Min assist Gait Distance (Feet): 50 Feet Assistive device: Rolling walker (2 wheels) Gait Pattern/deviations: Step-to pattern, Step-through pattern, Decreased stride length, Ataxic, Trunk flexed       General Gait Details: Pt with near fall during first few steps. Assisted back into recliner with gait belt. After resting, pt was able to attempt ambulation again. Cues for safety, technique, posture, RW proximity, BOS, step lengths. Gait remains ataxic. Significant support on RW with UEs. Cues for step to pattern to try to increase stability. Poor posture but I think it is partly due to pt having to watch her feet because she has poor proprioception   Stairs             Wheelchair Mobility    Modified Rankin (Stroke Patients Only)       Balance Overall balance assessment: Needs assistance, History of Falls         Standing balance support: During functional activity, Bilateral upper extremity supported Standing balance-Leahy Scale: Poor  Cognition Arousal/Alertness: Awake/alert Behavior During Therapy: WFL for tasks assessed/performed Overall Cognitive Status: Within Functional Limits for tasks assessed                                          Exercises      General  Comments        Pertinent Vitals/Pain Pain Assessment Pain Assessment: Faces Faces Pain Scale: Hurts even more Pain Location: R groin/lower abdomen Pain Descriptors / Indicators: Grimacing, Discomfort Pain Intervention(s): Limited activity within patient's tolerance, Monitored during session, Repositioned    Home Living                          Prior Function            PT Goals (current goals can now be found in the care plan section) Progress towards PT goals: Progressing toward goals    Frequency    Min 3X/week      PT Plan Current plan remains appropriate    Co-evaluation              AM-PAC PT "6 Clicks" Mobility   Outcome Measure  Help needed turning from your back to your side while in a flat bed without using bedrails?: None Help needed moving from lying on your back to sitting on the side of a flat bed without using bedrails?: None Help needed moving to and from a bed to a chair (including a wheelchair)?: A Little Help needed standing up from a chair using your arms (e.g., wheelchair or bedside chair)?: A Little Help needed to walk in hospital room?: A Lot Help needed climbing 3-5 steps with a railing? : A Lot 6 Click Score: 18    End of Session Equipment Utilized During Treatment: Gait belt Activity Tolerance: Patient limited by fatigue Patient left: in bed;with call bell/phone within reach;with bed alarm set   PT Visit Diagnosis: History of falling (Z91.81);Difficulty in walking, not elsewhere classified (R26.2);Muscle weakness (generalized) (M62.81);Unsteadiness on feet (R26.81);Pain Pain - Right/Left: Right Pain - part of body:  (groin/lower abdomen)     Time: 9381-0175 PT Time Calculation (min) (ACUTE ONLY): 25 min  Charges:  $Gait Training: 23-37 mins                         Doreatha Massed, PT Acute Rehabilitation  Office: [phone] Pager: [phone]

## 2021-02-19 ENCOUNTER — Ambulatory Visit: Payer: Medicare Other | Admitting: Radiation Oncology

## 2021-02-19 ENCOUNTER — Ambulatory Visit
Admit: 2021-02-19 | Discharge: 2021-02-19 | Disposition: A | Discharge status: Home / Self Care | Payer: Medicare Other | Attending: Radiation Oncology | Admitting: Radiation Oncology

## 2021-02-19 LAB — GLUCOSE, CAPILLARY: Glucose-Capillary: 169 mg/dL — ABNORMAL HIGH (ref 70–99)

## 2021-02-19 MED ORDER — OXYCODONE-ACETAMINOPHEN 5-325 MG PO TABS
1.0000 | ORAL_TABLET | Freq: Four times a day (QID) | ORAL | 0 refills | Status: DC | PRN
Start: 2021-02-19 — End: 2021-03-09

## 2021-02-19 MED ORDER — NITROFURANTOIN MONOHYD MACRO 100 MG PO CAPS: 100.0000 mg | ORAL_CAPSULE | Freq: Every day | ORAL | 0 refills | Status: AC

## 2021-02-19 MED ORDER — DEXAMETHASONE 4 MG PO TABS: 4.0000 mg | ORAL_TABLET | Freq: Two times a day (BID) | ORAL | 0 refills | Status: DC

## 2021-02-19 MED ORDER — ENSURE ENLIVE PO LIQD: 237.0000 mL | Freq: Two times a day (BID) | ORAL | 12 refills | Status: AC

## 2021-02-19 NOTE — Discharge Summary (Signed)
Physician Discharge Summary   Patient: Leslie [name] MRN: [id number] DOB: [birth year]  Admit date:     02/07/2021  Discharge date: 02/19/21  Discharge Physician: Annita Brod   PCP: Mayra Neer, MD   Recommendations at discharge:   Patient will be discharged home with Foley catheter.  This will be followed up by radiation oncology where after radiation completed, catheter may be removed to see if patient can void. New medication: Decadron 4 mg p.o. every 12 hours.  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 [facility] Course   76 year old female with past medical history of recurrent breast cancer as well as hypertension and hyperlipidemia presented to the emergency room on 1/18 with complaints of urinary retention times several days as well as lower back pain with a 3 to 4-week history of saddle anesthesia.  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  Case discussed with neurosurgery recommended lumbar puncture but no surgical intervention at this time.  Patient met with radiation oncology and after discussion of options, chose 2 weeks of craniospinal radiation, with initial treatment started on 1/26.  * 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[employer])- (present on admission) Appreciate radiation  oncology intervention.  Craniospinal radiation started 1/26.  Continue steroids with plans to taper following completion of 2-week radiation course.  Patient still feels very worn out.  Next radiation session scheduled for Monday, 1/30.  Given new catheter and frailty, for patient's son who is out of town currently to stay with patient.  Patient's son came back on Sunday night.  Patient be discharged today in the care of her son.  Continue steroids until radiation complete and patient follows up with radiation oncology.  Malignant neoplasm of upper-outer quadrant of left breast in female, estrogen receptor positive ([place]) Seen by medical and radiation oncology.  CT of abdomen and pelvis and chest note pleural effusions and diffuse metastatic bone disease as well as concerns for possible metastases in the spinal muscles, but no findings suspicious for any abdominal/pelvic metastatic disease.  Protein-calorie malnutrition, severe- (present on admission) Nutrition Status: Nutrition Problem: Severe Malnutrition Etiology: chronic illness, cancer and cancer related treatments Signs/Symptoms: severe fat depletion, severe muscle depletion Interventions: Ensure Enlive (each supplement provides 350kcal and 20 grams of protein)     Essential hypertension- (present on admission) Initially on antihypertensives, but as per patient, PCP discontinued these.  Blood pressure has been trending upward with systolic in the 578I.  Not much improved after little diuresis.  Constipation- (present on admission) Continue bowel regimen of MiraLAX and Senokot plus as needed sorbitol.  Hyperlipidemia- (present on admission) Statin held as per oncology recommendations and transaminitis.  Would continue to hold and can resume if appropriate in the future as per oncology.  Leukocytosis Mild, secondary to steroids.  At this time, no evidence of infection.  Acute urinary retention- (present on admission)  Secondary to  neural impingement from metastases to spine.  Status post Foley catheter placement.  Patient will likely go home with Foley catheter.  Will place on suppressive antibiotics.        Consultants:  Radiation oncology Medical oncology Case discussed with neurosurgery  Procedures performed: Ongoing radiation treatments Disposition: Home Diet recommendation: Regular diet  DISCHARGE MEDICATION: Allergies as of 02/19/2021       Reactions   Aspirin Nausea And Vomiting   Cetirizine Other (See Comments)   Chest Pain   Rosuvastatin Other (See Comments)   MD suspended this due to bone pain        Medication List     STOP taking these medications    gabapentin 300 MG capsule Commonly known as: NEURONTIN   venlafaxine XR 37.5 MG 24 hr capsule Commonly known as: EFFEXOR-XR       TAKE these medications    anastrozole 1 MG tablet Commonly known as: ARIMIDEX Take 1 tablet (1 mg total) by mouth daily.   atenolol-chlorthalidone 50-25 MG tablet Commonly known as: TENORETIC Take 0.5 tablets by mouth daily as needed (for elevated B/P).   dexamethasone 4 MG tablet Commonly known as: DECADRON Take 1 tablet (4 mg total) by mouth every 12 (twelve) hours.   Evening Primrose Oil 1000 MG Caps Take 1,000 mg by mouth daily.   feeding supplement Liqd Take 237 mLs by mouth 2 (two) times daily between meals.   fluocinonide cream 0.05 % Commonly known as: LIDEX Apply 1 application topically 2 (two) times daily as needed (for irritation- affected areas).   folic acid 929 MCG tablet Commonly known as: FOLVITE Take 400 mcg by mouth daily.   lidocaine 5 % Commonly known as: LIDODERM Place 1 patch onto the skin See admin instructions. Apply 1 patch to the lower back once a day and remove & discard the patch within 12 hours or as directed by MD   mirtazapine 7.5 MG tablet Commonly known as: REMERON Take 7.5 mg by mouth at bedtime.   montelukast 10 MG tablet Commonly known as:  SINGULAIR Take 10 mg by mouth at bedtime.   MULTIVITAMIN PO Take 1 tablet by mouth daily with breakfast.   nitrofurantoin (macrocrystal-monohydrate) 100 MG capsule Commonly known as: MACROBID Take 1 capsule (100 mg total) by mouth at bedtime.   ondansetron 8 MG tablet Commonly known as: ZOFRAN Take 8 mg by mouth every 8 (eight) hours as needed for nausea or vomiting.   oxyCODONE-acetaminophen 5-325 MG tablet Commonly known as: PERCOCET/ROXICET Take 1 tablet by mouth every 6 (six) hours as needed for severe pain.   triamcinolone cream 0.1 % Commonly known as: KENALOG Apply 1 application topically daily as needed (for itching- affectes sites).   Tylenol 8 Hour 650 MG CR tablet Generic drug: acetaminophen Take 650-1,300 mg by mouth every 8 (eight) hours as needed for pain.   Vitamin D3 10 MCG (400 UNIT) Caps Take 400 Units by mouth daily with breakfast.   vitamin E 180 MG (400 UNITS) capsule Take 400 Units by mouth daily.               Durable Medical Equipment  (From admission, onward)           Start     Ordered   02/09/21 0747  For home use only DME Walker rolling  Once       Question Answer Comment  Walker: With 5 Inch Wheels   Patient needs a walker to treat with  the following condition Debility      02/09/21 0746            Follow-up Information     [facility] Follow up.   Why: HH nursing/PT/ and OT will be provided in the home                Discharge Exam: Filed Weights   02/17/21 0428 02/18/21 0500 02/19/21 0500  Weight: 46.5 kg 48.4 kg 46 kg   General: Alert and oriented x3, no acute distress Cardiovascular: Regular rate and rhythm, S1-S2 Lungs to auscultation bilaterally  Condition at discharge: improving  The results of significant diagnostics from this hospitalization (including imaging, microbiology, ancillary and laboratory) are listed below for reference.   Imaging Studies: DG Lumbar Spine  Complete  Result Date: 02/05/2021 CLINICAL DATA:  Hip pain EXAM: LUMBAR SPINE - COMPLETE 4+ VIEW COMPARISON:  08/10/2019 FINDINGS: Levoscoliosis. Vertebral body heights are grossly maintained. 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 Multilevel degenerative change, advanced at L2-L3. Facet degenerative changes at multiple levels. IMPRESSION: 1. Scoliosis with degenerative changes most advanced at L2-L3. 2. 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   On: 02/05/2021 15:33   CT HEAD WO CONTRAST (5MM)  Result Date: 02/08/2021 CLINICAL DATA:  Study prior to lumbar puncture EXAM: CT HEAD WITHOUT CONTRAST TECHNIQUE: Contiguous axial images were obtained from the base of the skull through the vertex without intravenous contrast. RADIATION DOSE REDUCTION: This exam was performed according to the departmental dose-optimization program which includes automated exposure control, adjustment of the mA and/or kV according to patient size and/or use of iterative reconstruction technique. COMPARISON:  MR brain done on 09/13/2009 FINDINGS: Brain: No acute intracranial findings are seen. Ventricles are not dilated. Cortical sulci are prominent. There are no epidural or subdural fluid collections. There is decreased density in the periventricular white matter. Vascular: Unremarkable. Skull: Unremarkable. Sinuses/Orbits: Visualized paranasal sinuses are unremarkable. There is fluid in the right mastoid air cells. There is no break in the cortical margins in the right mastoid. Other: None IMPRESSION: No acute intracranial findings are seen. Atrophy. Small-vessel disease. There is fluid density in the right mastoid air cells suggesting right mastoid effusion or chronic right mastoiditis. Electronically Signed   By: Elmer Picker M.D.   On: 02/08/2021 12:43   CT CHEST W CONTRAST  Result Date: 02/08/2021 CLINICAL DATA:  Restaging  metastatic breast cancer. EXAM: CT CHEST, ABDOMEN, AND PELVIS WITH CONTRAST TECHNIQUE: Multidetector CT imaging of the chest, abdomen and pelvis was performed following the standard protocol during bolus administration of intravenous contrast. RADIATION DOSE REDUCTION: This exam was performed according to the departmental dose-optimization program which includes automated exposure control, adjustment of the mA and/or kV according to patient size and/or use of iterative reconstruction technique. CONTRAST:  25mL OMNIPAQUE IOHEXOL 300 MG/ML  SOLN COMPARISON:  Chest CT 01/03/2021. Abdominal/pelvic CT scan 08/14/2018 FINDINGS: CT CHEST FINDINGS Cardiovascular: The heart is normal in size. No pericardial effusion. The aorta is normal in caliber. No dissection. Stable aortic and coronary artery calcifications. Mediastinum/Nodes: No mediastinal or hilar mass or lymphadenopathy. The esophagus is grossly normal. Lungs/Pleura: Stable emphysematous changes and areas of pulmonary scarring. Small scattered perifissural nodules consistent with small lymph nodes. No worrisome pulmonary lesions to suggest pulmonary metastatic disease. There are bilateral pleural effusions, left larger than right with overlying atelectasis left greater than right. No obvious enhancing pleural nodules. Musculoskeletal: Overall stable appearing severe diffuse metastatic  bone disease with mixed lytic and sclerotic changes and old pathologic fractures. I do not see any obvious new or progressive findings. The left paraspinal muscles are thickened and demonstrate heterogeneous enhancement findings suspicious for tumor involvement. CT ABDOMEN PELVIS FINDINGS Hepatobiliary: No hepatic lesions to suggest metastatic disease. The gallbladder is unremarkable. No intra or extrahepatic biliary dilatation. Pancreas: No mass, inflammation or ductal dilatation. Spleen: Normal size.  No focal lesions. Adrenals/Urinary Tract: The adrenal glands and kidneys are  unremarkable and stable. Small renal cysts are noted. No worrisome renal lesions. The bladder is decompressed by a Foley catheter. Stomach/Bowel: Stomach, duodenum, small bowel and colon are grossly normal. Vascular/Lymphatic: Stable advanced atherosclerotic calcifications involving the aorta and iliac arteries but no aneurysm. Stable small scattered mesenteric and retroperitoneal lymph nodes but no mass or adenopathy. No findings suspicious for peritoneal carcinomatosis. Reproductive: Surgically absent. Other: No free air or free fluid. 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 There is a large lytic lesion involving the right acetabulum but I do not see a definite pathologic fracture. Lytic lesion involving the upper right iliac bone with destruction of the medial cortex. No obvious pathologic hip fracture. Large enhancing masslike area in the left gluteus medius muscle worrisome for a muscle involvement, likely from direct extension. Other areas of abnormal muscle enhancement could suggest muscle lesions. IMPRESSION: 1. 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 2. Several areas of masslike enhancement in the muscles of the spine and pelvis worrisome for tumor involvement. 3. No findings suspicious for pulmonary metastatic disease. 4. Bilateral pleural effusions, left larger than right with overlying atelectasis left greater than right. 5. No findings suspicious for abdominal/pelvic metastatic disease. 6. Stable advanced atherosclerotic calcifications involving the thoracic and abdominal aorta and iliac arteries. Aortic Atherosclerosis (ICD10-I70.0) and Emphysema (ICD10-J43.9). Electronically Signed   By: Marijo Sanes M.D.   On: 02/08/2021 19:23   MR BRAIN W WO CONTRAST  Result Date: 02/09/2021 CLINICAL DATA:  Restaging metastatic cancer, history of  breast cancer with widespread osseous metastatic disease EXAM: MRI HEAD WITHOUT AND WITH CONTRAST MRI CERVICAL SPINE WITHOUT AND WITH CONTRAST TECHNIQUE: Multiplanar, multiecho pulse sequences of the brain and surrounding structures, and cervical spine, to include the craniocervical junction and cervicothoracic junction, were obtained without and with intravenous contrast. CONTRAST:  44mL GADAVIST GADOBUTROL 1 MMOL/ML IV SOLN COMPARISON:  09/13/2009 MRI head, correlation is also made with 02/08/2021 CT head; no prior MRI of the cervical spine, correlation is made with 02/07/2021 MRI thoracic spine FINDINGS: MRI HEAD FINDINGS Brain: No restricted diffusion to suggest acute or subacute infarct. No acute hemorrhage, mass, mass effect, or midline shift. Leptomeningeal enhancement in the anterior temporal lobes (series 20, images 23 and 22), posterior left temporal lobe (series 20, image 22), along the anterior surface of the midbrain (series 28, image 25) and pons (series 30, image 12), and in the cerebellar folia (series 28, images 17-25). Enhancement of the medulla and brainstem are better seen on the cervical spine images (series 26, image 7). No hydrocephalus or extra-axial collection. Scattered T2 hyperintense signal in the periventricular white matter, likely the sequela of mild chronic small vessel ischemic disease. No cerebral edema. Vascular: Normal flow voids. Skull and upper cervical spine: In the midline/left parietal calvarium, there is a 10 x 5 x 8 mm mildly enhancing lesion (series 28, image 35 and series 30, image 14).  Otherwise normal marrow signal. Sinuses/Orbits: No acute finding. Status post bilateral lens replacements. Other: Fluid throughout the right mastoid air cells. MRI CERVICAL SPINE FINDINGS Evaluation is somewhat limited by motion artifact. Alignment: Straightening of the normal cervical lordosis. No listhesis. Vertebrae: Abnormal T1 signal with patchy enhancement, for example in C2 and C6  consistent with known extensive osseous metastatic disease. No evidence of pathologic fracture. For findings in the thoracic spine, see 02/07/2021 MRI thoracic spine. Cord: Leptomeningeal enhancement along the brainstem and majority of the cervical spinal cord (series 26, image 7 and series 27, image 13). The spinal cord demonstrates increased T2 signal centrally from the level of C2 and extending inferiorly throughout the imaged spinal cord, although this is most prominent from the level of C5 extending inferiorly (series 25, image 7 and series 22, image 17). From C6 through C7-T1, there also appears to be prominence of the central canal of the spinal cord (series 22, images 21-27). Posterior Fossa, vertebral arteries, paraspinal tissues: For findings related to the pons and cerebellum, please see MRI brain findings above. Disc levels: Degenerative changes without significant spinal canal stenosis. IMPRESSION: MRI BRAIN: 1. 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 2. No abnormal parenchymal enhancement to suggest parenchymal metastatic disease. 3. Focus of enhancement in the left parietal calvarium is favored to be benign. Attention on follow-up. MRI CERVICAL SPINE 1. Leptomeningeal enhancement along the brainstem and the majority of the cervical spinal cord, with increased T2 signal in the cervical spinal cord, most likely edema. In addition, there may be prominence of the central canal from C6 through C7-T1. 2. Known osseous metastatic disease, without evidence of pathologic fracture. Electronically Signed   By: Merilyn Baba M.D.   On: 02/09/2021 14:28   MR CERVICAL SPINE W WO CONTRAST  Result Date: 02/09/2021 CLINICAL DATA:  Restaging metastatic cancer, history of breast cancer with widespread osseous metastatic disease EXAM: MRI HEAD WITHOUT AND WITH CONTRAST MRI CERVICAL SPINE  WITHOUT AND WITH CONTRAST TECHNIQUE: Multiplanar, multiecho pulse sequences of the brain and surrounding structures, and cervical spine, to include the craniocervical junction and cervicothoracic junction, were obtained without and with intravenous contrast. CONTRAST:  65mL GADAVIST GADOBUTROL 1 MMOL/ML IV SOLN COMPARISON:  09/13/2009 MRI head, correlation is also made with 02/08/2021 CT head; no prior MRI of the cervical spine, correlation is made with 02/07/2021 MRI thoracic spine FINDINGS: MRI HEAD FINDINGS Brain: No restricted diffusion to suggest acute or subacute infarct. No acute hemorrhage, mass, mass effect, or midline shift. Leptomeningeal enhancement in the anterior temporal lobes (series 20, images 23 and 22), posterior left temporal lobe (series 20, image 22), along the anterior surface of the midbrain (series 28, image 25) and pons (series 30, image 12), and in the cerebellar folia (series 28, images 17-25). Enhancement of the medulla and brainstem are better seen on the cervical spine images (series 26, image 7). No hydrocephalus or extra-axial collection. Scattered T2 hyperintense signal in the periventricular white matter, likely the sequela of mild chronic small vessel ischemic disease. No cerebral edema. Vascular: Normal flow voids. Skull and upper cervical spine: In the midline/left parietal calvarium, there is a 10 x 5 x 8 mm mildly enhancing lesion (series 28, image 35 and series 30, image 14). Otherwise normal marrow signal. Sinuses/Orbits: No acute finding. Status post bilateral lens replacements. Other: Fluid throughout the right mastoid air cells. MRI CERVICAL SPINE FINDINGS Evaluation is somewhat limited  by motion artifact. Alignment: Straightening of the normal cervical lordosis. No listhesis. Vertebrae: Abnormal T1 signal with patchy enhancement, for example in C2 and C6 consistent with known extensive osseous metastatic disease. No evidence of pathologic fracture. For findings in the  thoracic spine, see 02/07/2021 MRI thoracic spine. Cord: Leptomeningeal enhancement along the brainstem and majority of the cervical spinal cord (series 26, image 7 and series 27, image 13). The spinal cord demonstrates increased T2 signal centrally from the level of C2 and extending inferiorly throughout the imaged spinal cord, although this is most prominent from the level of C5 extending inferiorly (series 25, image 7 and series 22, image 17). From C6 through C7-T1, there also appears to be prominence of the central canal of the spinal cord (series 22, images 21-27). Posterior Fossa, vertebral arteries, paraspinal tissues: For findings related to the pons and cerebellum, please see MRI brain findings above. Disc levels: Degenerative changes without significant spinal canal stenosis. IMPRESSION: MRI BRAIN: 1. 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 2. No abnormal parenchymal enhancement to suggest parenchymal metastatic disease. 3. Focus of enhancement in the left parietal calvarium is favored to be benign. Attention on follow-up. MRI CERVICAL SPINE 1. Leptomeningeal enhancement along the brainstem and the majority of the cervical spinal cord, with increased T2 signal in the cervical spinal cord, most likely edema. In addition, there may be prominence of the central canal from C6 through C7-T1. 2. Known osseous metastatic disease, without evidence of pathologic fracture. Electronically Signed   By: Merilyn Baba M.D.   On: 02/09/2021 14:28   MR THORACIC SPINE W WO CONTRAST  Result Date: 02/07/2021 CLINICAL DATA:  Back pain. Difficulty with urination. History of breast cancer with widespread osseous metastatic disease. EXAM: MRI THORACIC AND LUMBAR SPINE WITHOUT AND WITH CONTRAST TECHNIQUE: Multiplanar and multiecho pulse sequences of the thoracic and lumbar spine were obtained  without and with intravenous contrast. CONTRAST:  55mL GADAVIST GADOBUTROL 1 MMOL/ML IV SOLN COMPARISON:  Lumbar spine radiographs 02/05/2021. Nuclear medicine whole-body bone scan 01/03/2021. Chest CT 01/03/2021. Lumbar spine MRI 10/04/2019. FINDINGS: MRI THORACIC SPINE FINDINGS The study is moderately motion degraded, greatest on axial sequences. Alignment: Mild lower thoracic dextroscoliosis. No significant listhesis. Vertebrae: Known widespread metastatic disease throughout the vertebral bodies and posterior elements at every thoracic level as well as in the included cervical spine. Pathologic T4 compression fracture with mild vertebral body height loss, similar to the prior chest CT. A T9 superior endplate Schmorl's node is unchanged. There is evidence of epidural tumor in the lower thoracic spine, greatest at T10-11 where there is resultant moderate spinal stenosis. The craniocaudal extent of epidural tumor is difficult to precisely determine although it may extend cranially to approximately T7-8. Cord: Abnormal enhancement along the surface of the spinal cord at least throughout the mid and upper thoracic spine with extension towards the cervical region. Abnormal T2 hyperintensity centrally in the upper and, to a lesser extent, mid thoracic spinal cord which could reflect edema or a syrinx extending towards the cervical region and measuring up to approximately 4 mm in diameter near the cervicothoracic junction. Additional edema in the lower thoracic spinal cord. Paraspinal and other soft tissues: Small left pleural effusion, larger than on the prior chest CT. Widespread STIR hyperintensity and enhancement of the left greater than right posterior paraspinal musculature. Disc levels: Disc degeneration greatest at T10-11 and T11-12 where there is asymmetrically severe  left-sided disc space narrowing. Up to moderate lower thoracic spinal stenosis due to epidural tumor. MRI LUMBAR SPINE FINDINGS Segmentation:   Standard. Alignment: Moderate lumbar levoscoliosis. No significant listhesis. Vertebrae: Chronic L4 Schmorl's nodes and mild chronic L3 compression fracture. Known widespread osseous metastatic disease throughout the lumbar spine and included pelvis. Epidural tumor at the thoracolumbar junction as noted above on the thoracic spine MRI. Mildly prominent epidural enhancement throughout the lumbar spine more caudally, indeterminate for epidural tumor but without a dominant epidural mass in the lumbar spine. Epidural tumor is suspected in the sacrum involving left-sided neural foramina. Prominent perineural cysts at multiple levels as previously seen. Conus medullaris: Extends to the T12-L1 level and demonstrates edema as noted above. There is abnormal nodular enhancement along the surface of the distal spinal cord/conus, and there also appears to be mild enhancement of cauda equina nerve roots in the lower lumbar and sacral canal. Paraspinal and other soft tissues: Chronic asymmetric left psoas and left greater than right posterior paraspinal muscle atrophy. Partially visualized extensive gluteal musculature enhancement bilaterally. Small right renal cyst. Disc levels: L1-2: Disc bulging and mild facet and ligamentum flavum hypertrophy result in moderate right lateral recess stenosis and moderate right and mild left neural foraminal stenosis, similar to the prior MRI. Borderline to mild spinal stenosis. L2-3: Disc bulging and moderate facet and ligamentum flavum hypertrophy result in mild right lateral recess stenosis and severe right neural foraminal stenosis, similar to the prior MRI. Borderline spinal stenosis. L3-4: Right eccentric disc bulging and mild-to-moderate facet and ligamentum flavum hypertrophy result in borderline to mild right lateral recess stenosis and mild right neural foraminal stenosis without spinal stenosis, similar to the prior MRI. L4-5: Minimal disc bulging without stenosis. L5-S1: Negative.  IMPRESSION: 1. Known widespread metastatic disease throughout the thoracic and lumbar spine. 2. 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 3. Abnormal enhancement relatively diffusely along the surface of the thoracic spinal cord with suspected mild involvement of the cauda equina nerve roots compatible with left meningeal disease. 4. Edema in the distal spinal cord/conus as well as edema and/or syrinx in the upper thoracic cord extending into the cervical region. 5. Widespread edema/enhancement involving the thoracic paraspinal musculature and bilateral gluteal musculature, potentially denervation or rhabdomyolysis. 6. Small left pleural effusion. Electronically Signed   By: Logan Bores M.D.   On: 02/07/2021 16:11   MR Lumbar Spine W Wo Contrast  Result Date: 02/07/2021 CLINICAL DATA:  Back pain. Difficulty with urination. History of breast cancer with widespread osseous metastatic disease. EXAM: MRI THORACIC AND LUMBAR SPINE WITHOUT AND WITH CONTRAST TECHNIQUE: Multiplanar and multiecho pulse sequences of the thoracic and lumbar spine were obtained without and with intravenous contrast. CONTRAST:  6mL GADAVIST GADOBUTROL 1 MMOL/ML IV SOLN COMPARISON:  Lumbar spine radiographs 02/05/2021. Nuclear medicine whole-body bone scan 01/03/2021. Chest CT 01/03/2021. Lumbar spine MRI 10/04/2019. FINDINGS: MRI THORACIC SPINE FINDINGS The study is moderately motion degraded, greatest on axial sequences. Alignment: Mild lower thoracic dextroscoliosis. No significant listhesis. Vertebrae: Known widespread metastatic disease throughout the vertebral bodies and posterior elements at every thoracic level as well as in the included cervical spine. Pathologic T4 compression fracture with mild vertebral body height loss, similar to the prior chest CT. A T9 superior endplate Schmorl's node is unchanged. There is evidence of epidural tumor in the lower thoracic spine, greatest at  T10-11 where there is resultant moderate spinal stenosis. The craniocaudal extent of epidural tumor is difficult to  precisely determine although it may extend cranially to approximately T7-8. Cord: Abnormal enhancement along the surface of the spinal cord at least throughout the mid and upper thoracic spine with extension towards the cervical region. Abnormal T2 hyperintensity centrally in the upper and, to a lesser extent, mid thoracic spinal cord which could reflect edema or a syrinx extending towards the cervical region and measuring up to approximately 4 mm in diameter near the cervicothoracic junction. Additional edema in the lower thoracic spinal cord. Paraspinal and other soft tissues: Small left pleural effusion, larger than on the prior chest CT. Widespread STIR hyperintensity and enhancement of the left greater than right posterior paraspinal musculature. Disc levels: Disc degeneration greatest at T10-11 and T11-12 where there is asymmetrically severe left-sided disc space narrowing. Up to moderate lower thoracic spinal stenosis due to epidural tumor. MRI LUMBAR SPINE FINDINGS Segmentation:  Standard. Alignment: Moderate lumbar levoscoliosis. No significant listhesis. Vertebrae: Chronic L4 Schmorl's nodes and mild chronic L3 compression fracture. Known widespread osseous metastatic disease throughout the lumbar spine and included pelvis. Epidural tumor at the thoracolumbar junction as noted above on the thoracic spine MRI. Mildly prominent epidural enhancement throughout the lumbar spine more caudally, indeterminate for epidural tumor but without a dominant epidural mass in the lumbar spine. Epidural tumor is suspected in the sacrum involving left-sided neural foramina. Prominent perineural cysts at multiple levels as previously seen. Conus medullaris: Extends to the T12-L1 level and demonstrates edema as noted above. There is abnormal nodular enhancement along the surface of the distal spinal cord/conus,  and there also appears to be mild enhancement of cauda equina nerve roots in the lower lumbar and sacral canal. Paraspinal and other soft tissues: Chronic asymmetric left psoas and left greater than right posterior paraspinal muscle atrophy. Partially visualized extensive gluteal musculature enhancement bilaterally. Small right renal cyst. Disc levels: L1-2: Disc bulging and mild facet and ligamentum flavum hypertrophy result in moderate right lateral recess stenosis and moderate right and mild left neural foraminal stenosis, similar to the prior MRI. Borderline to mild spinal stenosis. L2-3: Disc bulging and moderate facet and ligamentum flavum hypertrophy result in mild right lateral recess stenosis and severe right neural foraminal stenosis, similar to the prior MRI. Borderline spinal stenosis. L3-4: Right eccentric disc bulging and mild-to-moderate facet and ligamentum flavum hypertrophy result in borderline to mild right lateral recess stenosis and mild right neural foraminal stenosis without spinal stenosis, similar to the prior MRI. L4-5: Minimal disc bulging without stenosis. L5-S1: Negative. IMPRESSION: 1. Known widespread metastatic disease throughout the thoracic and lumbar spine. 2. 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 3. Abnormal enhancement relatively diffusely along the surface of the thoracic spinal cord with suspected mild involvement of the cauda equina nerve roots compatible with left meningeal disease. 4. Edema in the distal spinal cord/conus as well as edema and/or syrinx in the upper thoracic cord extending into the cervical region. 5. Widespread edema/enhancement involving the thoracic paraspinal musculature and bilateral gluteal musculature, potentially denervation or rhabdomyolysis. 6. Small left pleural effusion. Electronically Signed   By: Logan Bores M.D.   On: 02/07/2021 16:11   CT ABDOMEN PELVIS W CONTRAST  Result Date:  02/08/2021 CLINICAL DATA:  Restaging metastatic breast cancer. EXAM: CT CHEST, ABDOMEN, AND PELVIS WITH CONTRAST TECHNIQUE: Multidetector CT imaging of the chest, abdomen and pelvis was performed following the standard protocol during bolus administration of intravenous contrast. RADIATION DOSE REDUCTION: This exam was performed according to the departmental dose-optimization  program which includes automated exposure control, adjustment of the mA and/or kV according to patient size and/or use of iterative reconstruction technique. CONTRAST:  3mL OMNIPAQUE IOHEXOL 300 MG/ML  SOLN COMPARISON:  Chest CT 01/03/2021. Abdominal/pelvic CT scan 08/14/2018 FINDINGS: CT CHEST FINDINGS Cardiovascular: The heart is normal in size. No pericardial effusion. The aorta is normal in caliber. No dissection. Stable aortic and coronary artery calcifications. Mediastinum/Nodes: No mediastinal or hilar mass or lymphadenopathy. The esophagus is grossly normal. Lungs/Pleura: Stable emphysematous changes and areas of pulmonary scarring. Small scattered perifissural nodules consistent with small lymph nodes. No worrisome pulmonary lesions to suggest pulmonary metastatic disease. There are bilateral pleural effusions, left larger than right with overlying atelectasis left greater than right. No obvious enhancing pleural nodules. Musculoskeletal: Overall stable appearing severe diffuse metastatic bone disease with mixed lytic and sclerotic changes and old pathologic fractures. I do not see any obvious new or progressive findings. The left paraspinal muscles are thickened and demonstrate heterogeneous enhancement findings suspicious for tumor involvement. CT ABDOMEN PELVIS FINDINGS Hepatobiliary: No hepatic lesions to suggest metastatic disease. The gallbladder is unremarkable. No intra or extrahepatic biliary dilatation. Pancreas: No mass, inflammation or ductal dilatation. Spleen: Normal size.  No focal lesions. Adrenals/Urinary Tract: The  adrenal glands and kidneys are unremarkable and stable. Small renal cysts are noted. No worrisome renal lesions. The bladder is decompressed by a Foley catheter. Stomach/Bowel: Stomach, duodenum, small bowel and colon are grossly normal. Vascular/Lymphatic: Stable advanced atherosclerotic calcifications involving the aorta and iliac arteries but no aneurysm. Stable small scattered mesenteric and retroperitoneal lymph nodes but no mass or adenopathy. No findings suspicious for peritoneal carcinomatosis. Reproductive: Surgically absent. Other: No free air or free fluid. 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 There is a large lytic lesion involving the right acetabulum but I do not see a definite pathologic fracture. Lytic lesion involving the upper right iliac bone with destruction of the medial cortex. No obvious pathologic hip fracture. Large enhancing masslike area in the left gluteus medius muscle worrisome for a muscle involvement, likely from direct extension. Other areas of abnormal muscle enhancement could suggest muscle lesions. IMPRESSION: 1. 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 2. Several areas of masslike enhancement in the muscles of the spine and pelvis worrisome for tumor involvement. 3. No findings suspicious for pulmonary metastatic disease. 4. Bilateral pleural effusions, left larger than right with overlying atelectasis left greater than right. 5. No findings suspicious for abdominal/pelvic metastatic disease. 6. Stable advanced atherosclerotic calcifications involving the thoracic and abdominal aorta and iliac arteries. Aortic Atherosclerosis (ICD10-I70.0) and Emphysema (ICD10-J43.9). Electronically Signed   By: Marijo Sanes M.D.   On: 02/08/2021 19:23   DG Chest Portable 1 View  Result Date: 02/07/2021 CLINICAL DATA:   Shortness of breath, difficulty urinating EXAM: PORTABLE CHEST 1 VIEW COMPARISON:  10/11/2020 FINDINGS: Mild cardiomegaly. Mild, diffuse bilateral interstitial pulmonary opacity. Small right pleural effusion. Unchanged elevation of the left hemidiaphragm with associated atelectasis or consolidation. The visualized skeletal structures are unremarkable. IMPRESSION: 1. Mild, diffuse bilateral interstitial pulmonary opacity, most consistent with edema. No focal airspace opacity. 2. Small right pleural effusion. 3. Unchanged elevation of the left hemidiaphragm with associated atelectasis or consolidation. Electronically Signed   By: Delanna Ahmadi M.D.   On: 02/07/2021 16:44   VAS [place] ABI WITH/WO TBI  Result Date: 02/01/2021  LOWER EXTREMITY DOPPLER STUDY  Patient Name:  GER NICKS  Date of Exam:   02/01/2021 Medical Rec #: [id number]        Accession #:    [id number] Date of Birth: 25-Dec-1945         Patient Gender: F Patient Age:   25 years Exam Location:  [facility] Procedure:      VAS [place] ABI WITH/WO TBI Referring Phys: Roderic Palau BERRY --------------------------------------------------------------------------------  Indications: Peripheral artery disease, and left SFA intervention. Patient does              not report any claudication symptoms or rest pain. She does report              bilateral leg pain but cancer has come back and now its in her              bones and she believes the pain is coming from that as well as the              medication she is currently taking for it. High Risk Factors: Hypertension, hyperlipidemia, current smoker.  Vascular Interventions: Successful chocolate balloon angioplasty followed by                         drug-eluting balloon angioplasty of a high-grade mid                         left SFA stenosis with an excellent angiographic result                         on 06/19/2017. Comparison Study: In 01/2020, a lower arterial Doppler showed an ABI of 1.05,                    bilaterally. Performing Technologist: Sharlett Iles RVT  Examination Guidelines: A complete evaluation includes at minimum, Doppler waveform signals and systolic blood pressure reading at the level of bilateral brachial, anterior tibial, and posterior tibial arteries, when vessel segments are accessible. Bilateral testing is considered an integral part of a complete examination. Photoelectric Plethysmograph (PPG) waveforms and toe systolic pressure readings are included as required and additional duplex testing as needed. Limited examinations for reoccurring indications may be performed as noted.  ABI Findings: +---------+------------------+-----+---------+--------+  Right     Rt Pressure (mmHg) Index Waveform  Comment   +---------+------------------+-----+---------+--------+  Brachial  129                                          +---------+------------------+-----+---------+--------+  PTA       143                1.09  triphasic           +---------+------------------+-----+---------+--------+  PERO      152                      triphasic 1.16      +---------+------------------+-----+---------+--------+  DP        146                1.11  triphasic           +---------+------------------+-----+---------+--------+  Great Toe 133                1.02  Normal              +---------+------------------+-----+---------+--------+ +---------+------------------+-----+---------+---------+  Left      Lt Pressure (mmHg) Index Waveform  Comment    +---------+------------------+-----+---------+---------+  Brachial  131                                           +---------+------------------+-----+---------+---------+  PTA                                absent    inaudible  +---------+------------------+-----+---------+---------+  PERO      141                      triphasic 1.08       +---------+------------------+-----+---------+---------+  DP        130                0.99  triphasic             +---------+------------------+-----+---------+---------+  Great Toe 99                 0.76  Normal               +---------+------------------+-----+---------+---------+ +-------+-----------+-----------+------------+------------+  ABI/TBI Today's ABI Today's TBI Previous ABI Previous TBI  +-------+-----------+-----------+------------+------------+  Right   1.16        1.02        1.05         .81           +-------+-----------+-----------+------------+------------+  Left    1.08        .76         1.05         .62           +-------+-----------+-----------+------------+------------+  Bilateral ABIs and right TBIs appear essentially unchanged compared to prior study on 02/02/2020. Left TBIs appear increased compared to prior study on 02/02/2020.  Summary: Right: Resting right ankle-brachial index is within normal range. No evidence of significant right lower extremity arterial disease. The right toe-brachial index is normal. Left: Resting left ankle-brachial index is within normal range. No evidence of significant left lower extremity arterial disease. The left toe-brachial index is normal.  *See table(s) above for measurements and observations. See LE Arterial duplex report. Suggest follow up study in 12 months. Electronically signed by Quay Burow MD on 02/01/2021 at 6:17:26 PM.    Final    DG Hip Unilat W or Wo Pelvis 2-3 Views Left  Result Date: 02/05/2021 CLINICAL DATA:  Hip pain EXAM: DG HIP (WITH OR WITHOUT PELVIS) 2-3V LEFT COMPARISON:  Bone scan 01/03/2021 FINDINGS: SI joints are non widened. Pubic symphysis and rami appear intact. 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 No fracture or malalignment. IMPRESSION: 1. No acute osseous abnormality. 2. 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   On: 02/05/2021 15:23   DG Hip Unilat W or Wo Pelvis 2-3 Views Right  Result Date:  02/05/2021 CLINICAL DATA:  Hip pain EXAM: DG HIP (WITH OR WITHOUT PELVIS) 2-3V RIGHT COMPARISON:  Bone scan 01/03/2021 FINDINGS: SI joints are non widened. Pubic symphysis is intact. No fracture or malalignment. Heterogeneous sclerosis within the femurs and pelvic bones consistent with history of skeletal osseous metastatic disease IMPRESSION: 1. No acute osseous abnormality 2. Heterogeneous sclerosis within the pelvic bones and femurs  consistent with history of skeletal metastatic disease Electronically Signed   By: Donavan Foil M.D.   On: 02/05/2021 15:22   VAS [place] LOWER EXTREMITY ARTERIAL DUPLEX  Result Date: 02/01/2021 LOWER EXTREMITY ARTERIAL DUPLEX STUDY Patient Name:  HAJAR PENNINGER  Date of Exam:   02/01/2021 Medical Rec #: [id number]        Accession #:    [id number] Date of Birth: [birth year]         Patient Gender: F Patient Age:   33 years Exam Location:  [facility] Procedure:      VAS [place] LOWER EXTREMITY ARTERIAL DUPLEX Referring Phys: Roderic Palau BERRY --------------------------------------------------------------------------------  Indications: Peripheral artery disease, and left SFA intervention. Patient does              not report any claudication symptoms or rest pain. She does report              bilateral leg pain but cancer has come back and now its in her              bones and she believes the pain is coming from that as well as the              medication she is currently taking for it. High Risk Factors: Hypertension, hyperlipidemia, current smoker.  Vascular Interventions: Successful chocolate balloon angioplasty followed by                         drug-eluting balloon angioplasty of a high-grade mid                         left SFA stenosis with an excellent angiographic result                         on 06/19/2017. Current ABI:            1.16 on the right and 1.08 on the left. Comparison Study: In 01/2020, a lower arterial duplex showed 30-49% stenosis in                   the left proximal  CFA. Patent left mid SFA without evidence of                   restenosis, s/p angioplasty. Performing Technologist: Sharlett Iles RVT  Examination Guidelines: A complete evaluation includes B-mode imaging, spectral Doppler, color Doppler, and power Doppler as needed of all accessible portions of each vessel. Bilateral testing is considered an integral part of a complete examination. Limited examinations for reoccurring indications may be performed as noted.   +----------+--------+-----+---------------+-----------+-------------+  LEFT       PSV cm/s Ratio Stenosis        Waveform    Comments       +----------+--------+-----+---------------+-----------+-------------+  CFA Prox   214            50-74% stenosis triphasic   low end range  +----------+--------+-----+---------------+-----------+-------------+  CFA Mid    191                            triphasic                  +----------+--------+-----+---------------+-----------+-------------+  CFA Distal 148  triphasic                  +----------+--------+-----+---------------+-----------+-------------+  DFA        283                            triphasic   turbulent      +----------+--------+-----+---------------+-----------+-------------+  SFA Prox   155                            biphasic                   +----------+--------+-----+---------------+-----------+-------------+  SFA Mid    118                            triphasic                  +----------+--------+-----+---------------+-----------+-------------+  SFA Distal 166                            multiphasic                +----------+--------+-----+---------------+-----------+-------------+  POP Prox   144                            triphasic                  +----------+--------+-----+---------------+-----------+-------------+  POP Mid    145                            triphasic                  +----------+--------+-----+---------------+-----------+-------------+  POP  Distal 153                            biphasic                   +----------+--------+-----+---------------+-----------+-------------+  TP Trunk   108                            triphasic                  +----------+--------+-----+---------------+-----------+-------------+  Summary: Left: No significant change as compared to previous study. Heterogeneous plaque throughout. 50-74% stenosis in the proximal CFA, low end range. Patent mid SFA without evidence of restenosis, s/p angioplasty.  See table(s) above for measurements and observations. See ABI report. Suggest follow up study in 12 months. Electronically signed by Quay Burow MD on 02/01/2021 at 6:17:09 PM.    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 EXAM: DIAGNOSTIC LUMBAR PUNCTURE UNDER FLUOROSCOPIC GUIDANCE COMPARISON:  Spine MRI 02/07/2021. Today's head CT, dictated separately. FLUOROSCOPY TIME:  Fluoroscopy Time:  2 minutes and 48 seconds Radiation Exposure Index (if provided by the fluoroscopic device): 14.9 mGy Number of Acquired Spot Images: 0 PROCEDURE: Informed consent was obtained from the patient prior to the procedure, including potential complications of headache, allergy, and pain. With the patient prone, the lower back was prepped with Betadine. 1% Lidocaine was used for local anesthesia.  Lumbar puncture was performed at the L4-5 level using a 20 gauge needle with return of clear CSF with an opening pressure of 9 cm water. After obtaining 2 vials each with 2 cc of clear CSF, the return of CSF became sanguinous. 1 cc of sanguinous CSF was obtained. At this point, the tubing was removed and the stylette placed in order to attempt to clear the CSF flow. Despite multiple maneuvers, CSF return could not be obtained a second time. The patient tolerated the procedure well and there were no apparent complications. IMPRESSION: Successful lumbar  puncture, as detailed above. Only a total of 5 cc of CSF were obtained, secondary to the CSF flow becoming sanguinous. Electronically Signed   By: Abigail Miyamoto M.D.   On: 02/08/2021 13:58    Microbiology: Results for orders placed or performed during the hospital encounter of 02/07/21  Resp Panel by RT-PCR (Flu A&B, Covid) Nasopharyngeal Swab     Status: None   Collection Time: 02/07/21  1:57 PM   Specimen: Nasopharyngeal Swab; Nasopharyngeal(NP) swabs in vial transport medium  Result Value Ref Range Status   SARS Coronavirus 2 by RT PCR NEGATIVE NEGATIVE Final    Comment: (NOTE) SARS-CoV-2 target nucleic acids are NOT DETECTED.  The SARS-CoV-2 RNA is generally detectable in upper respiratory specimens during the acute phase of infection. The lowest concentration of SARS-CoV-2 viral copies this assay can detect is 138 copies/mL. A negative result does not preclude SARS-Cov-2 infection and should not be used as the sole basis for treatment or other patient management decisions. A negative result may occur with  improper specimen collection/handling, submission of specimen other than nasopharyngeal swab, presence of viral mutation(s) within the areas targeted by this assay, and inadequate number of viral copies(<138 copies/mL). A negative result must be combined with clinical observations, patient history, and epidemiological information. The expected result is Negative.  Fact Sheet for Patients:  EntrepreneurPulse.com.au  Fact Sheet for Healthcare Providers:  IncredibleEmployment.be  This test is no t yet approved or cleared by the [place] FDA and  has been authorized for detection and/or diagnosis of SARS-CoV-2 by FDA under an Emergency Use Authorization (EUA). This EUA will remain  in effect (meaning this test can be used) for the duration of the COVID-19 declaration under Section 564(b)(1) of the Act, 21 U.S.C.section 360bbb-3(b)(1),  unless the authorization is terminated  or revoked sooner.       Influenza A by PCR NEGATIVE NEGATIVE Final   Influenza B by PCR NEGATIVE NEGATIVE Final    Comment: (NOTE) The Xpert Xpress SARS-CoV-2/FLU/RSV plus assay is intended as an aid in the diagnosis of influenza from Nasopharyngeal swab specimens and should not be used as a sole basis for treatment. Nasal washings and aspirates are unacceptable for Xpert Xpress SARS-CoV-2/FLU/RSV testing.  Fact Sheet for Patients: EntrepreneurPulse.com.au  Fact Sheet for Healthcare Providers: IncredibleEmployment.be  This test is not yet approved or cleared by the [place] FDA and has been authorized for detection and/or diagnosis of SARS-CoV-2 by FDA under an Emergency Use Authorization (EUA). This EUA will remain in effect (meaning this test can be used) for the duration of the COVID-19 declaration under Section 564(b)(1) of the Act, 21 U.S.C. section 360bbb-3(b)(1), unless the authorization is terminated or revoked.  Performed at [facility], [place] [address], [place], [place] [address]   MRSA Next Gen by PCR, Nasal     Status: None   Collection Time: 02/07/21 10:35 PM   Specimen: Nasal Mucosa;  Nasal Swab  Result Value Ref Range Status   MRSA by PCR Next Gen NOT DETECTED NOT DETECTED Final    Comment: (NOTE) The GeneXpert MRSA Assay (FDA approved for NASAL specimens only), is one component of a comprehensive MRSA colonization surveillance program. It is not intended to diagnose MRSA infection nor to guide or monitor treatment for MRSA infections. Test performance is not FDA approved in patients less than 36 years old. Performed at [facility], [place] [address] [place], [place] [address]   CSF culture w Gram Stain     Status: None   Collection Time: 02/08/21  1:20 PM   Specimen: PATH Cytology CSF; Cerebrospinal Fluid  Result Value Ref Range Status    Specimen Description   Final    CSF Performed at [place] [address] [place], [place] [address]    Special Requests   Final    NONE Performed at [facility], [place] [address] [place], [place] [address]    Gram Stain   Final    WBC PRESENT,BOTH PMN AND MONONUCLEAR NO ORGANISMS SEEN Gram Stain Report Called to,Read Back By and Verified With: R.PULEO, RN AT 3142 ON 01.19.23 BY N.THOMPSON CYTOSPIN SMEAR Performed at [place] [address] [place], [place] [address]    Culture   Final    NO GROWTH 3 DAYS Performed at [facility], [place] [address] [place],  [address]    Report Status 02/12/2021 FINAL  Final    Labs: CBC: Recent Labs  Lab 02/13/21 0051 02/14/21 0424 02/15/21 0423 02/17/21 0504  WBC 11.8* 12.2* 12.6* 8.8  HGB 13.6 13.6 13.9 13.6  HCT 41.8 41.9 42.1 43.2  MCV 98.4 99.3 99.1 101.2*  PLT 342 282 269 961   Basic Metabolic Panel: Recent Labs  Lab 02/13/21 0051 02/14/21 0424 02/15/21 0423 02/17/21 0504  NA 132* 134* 133* 131*  K 4.4 4.7 4.5 4.8  CL 90* 93* 93* 93*  CO2 $Re'30 31 31 29  'XJW$ GLUCOSE 374* 194* 243* 202*  BUN 36* 35* 38* 28*  CREATININE 0.88 0.75 0.65 0.64  CALCIUM 8.7* 8.9 8.9 8.9  PHOS 3.8 3.6  --   --    Liver Function Tests: Recent Labs  Lab 02/13/21 0051 02/14/21 0424  ALBUMIN 3.0* 3.0*   CBG: Recent Labs  Lab 02/15/21 0749 02/16/21 0741 02/17/21 0800 02/18/21 0728 02/19/21 0743  GLUCAP 220* 198* 178* 196* 169*    Discharge time spent: less than 30 minutes.  Signed: Annita Brod, MD [facility] 02/19/2021

## 2021-02-19 NOTE — TOC Transition Note (Signed)
Transition of Care [facility]) - CM/SW Discharge Note   Patient Details  Name: Leslie [name] MRN: [id number] Date of Birth: [birth year]  Transition of Care [facility]) CM/SW Contact:  Dessa Phi, RN Phone Number: 02/19/2021, 11:28 AM   Clinical Narrative: d/c home w/AHH-rep Corene Cornea aware of d/c home w/HHRN/PT/OT. [employer] has already delivered rw to rm. No further CM needs.      Final next level of care: [place] Barriers to Discharge: No Barriers Identified   Patient Goals and CMS Choice Patient states their goals for this hospitalization and ongoing recovery are:: Return home with [facility] CMS Medicare.gov Compare Post Acute Care list provided to:: Patient Choice offered to / list presented to : Patient  Discharge Placement                       Discharge Plan and Services In-house Referral: Clinical Social Work Discharge Planning Services: CM Consult Post Acute Care Choice: Durable Medical Equipment, Home Health          DME Arranged: [place] DME Agency: [employer] Date DME Agency Contacted: 02/09/21 Time DME Agency Contacted: 1212 Representative spoke with at DME Agency: [place]: RN [place] Agency: [place] ([place]) Date [place]: 02/08/21   Representative spoke with at [place]: [place] ([place]) Interventions     Readmission Risk Interventions No flowsheet data found.

## 2021-02-19 NOTE — Progress Notes (Signed)
Discharge instructions reviewed with patient and family including medications, prescriptions, and appointments. Instructed patient and family on care of foley catheter and leg bag uses. Patient and family verbalized understanding. Shanayah Kaffenberger, Laurel Dimmer, RN

## 2021-02-19 NOTE — Progress Notes (Signed)
Occupational Therapy Treatment Patient Details Name: Leslie [name] MRN: [id number] DOB: 18-Feb-1945 Today's Date: 02/19/2021   History of present illness Patient is a 76 year old female admitted with urinary retention, back pain with radiation shooting into the legs, 3 to 4-week history of saddle anesthesia. 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  Patient seen in consultation by neurosurgery, Dr. Ronnald Ramp who recommended lumbar puncture, no surgical intervention.   PMH recurrent breast cancer with mets to the bone currently on zoledronate, anastrozole,palbociclib, hypertension, hyperlipidemia   OT comments  Patient and son were educated on ADL assist levels at home, how to maneuver with RW, importance of having min guard with standing/ functional mobility and how to use gait belt. Patient and patients son verbalized understanding. Patients son was also educated on importance of patient being able to see feet to improve balance with RW. Patient verbalized understanding.patient recommended to have 24/7 support from caregiver to be successful in next level of care. Patient's discharge plan remains appropriate at this time. OT will continue to follow acutely.     Recommendations for follow up therapy are one component of a multi-disciplinary discharge planning process, led by the attending physician.  Recommendations may be updated based on patient status, additional functional criteria and insurance authorization.    Follow Up Recommendations  Home health OT    Assistance Recommended at Discharge Intermittent Supervision/Assistance  Patient can return home with the following  Assistance with cooking/housework;A little help with walking and/or transfers   Equipment  Recommendations  Tub/shower seat    Recommendations for Other Services      Precautions / Restrictions Precautions Precautions: Fall Restrictions Weight Bearing Restrictions: No       Mobility Bed Mobility Overal bed mobility: Modified Independent             General bed mobility comments: used bedrail    Transfers                         Balance Overall balance assessment: Needs assistance, History of Falls Sitting-balance support: Feet supported Sitting balance-Leahy Scale: Good     Standing balance support: During functional activity, Bilateral upper extremity supported Standing balance-Leahy Scale: Poor                             ADL either performed or assessed with clinical judgement   ADL Overall ADL's : Needs assistance/impaired     Grooming: Set up;Sitting   Upper Body Bathing: Set up;Sitting Upper Body Bathing Details (indicate cue type and reason): patient compelted seated bathing tasks in recliner with MI after set up with increased time.son educated on seated vs standing ADL assistance.     Upper Body Dressing : Set up;Sitting Upper Body Dressing Details (indicate cue type and reason): in recliner                   General ADL Comments: patients son was educated on proper use of RW for transfers, way to support patient to prevent falling, creating opportunities to see BLE, guarding with RW. patients son verbalized understanding.    Extremity/Trunk Assessment              Vision  Perception     Praxis      Cognition Arousal/Alertness: Awake/alert Behavior During Therapy: WFL for tasks assessed/performed Overall Cognitive Status: Within Functional Limits for tasks assessed                                          Exercises      Shoulder Instructions       General Comments      Pertinent Vitals/ Pain       Pain Assessment Pain Assessment: No/denies pain  Home Living                                           Prior Functioning/Environment              Frequency  Min 2X/week        Progress Toward Goals  OT Goals(current goals can now be found in the care plan section)  Progress towards OT goals: Progressing toward goals     Plan Discharge plan remains appropriate    Co-evaluation                 AM-PAC OT "6 Clicks" Daily Activity     Outcome Measure   Help from another person eating meals?: None Help from another person taking care of personal grooming?: A Little Help from another person toileting, which includes using toliet, bedpan, or urinal?: A Little Help from another person bathing (including washing, rinsing, drying)?: A Little Help from another person to put on and taking off regular upper body clothing?: A Little Help from another person to put on and taking off regular lower body clothing?: A Little 6 Click Score: 19    End of Session Equipment Utilized During Treatment: Rolling walker (2 wheels);Gait belt  OT Visit Diagnosis: Unsteadiness on feet (R26.81);Other abnormalities of gait and mobility (R26.89);History of falling (Z91.81)   Activity Tolerance Patient tolerated treatment well   Patient Left with call bell/phone within reach;in chair;with chair alarm set   Nurse Communication Mobility status        Time: 0865-7846 OT Time Calculation (min): 17 min  Charges: OT General Charges $OT Visit: 1 Visit OT Treatments $Therapeutic Activity: 8-22 mins  Jackelyn Poling OTR/L, MS Acute Rehabilitation Department Office# [phone] Pager# [phone]   Marcellina Millin 02/19/2021, 12:45 PM

## 2021-02-19 NOTE — Progress Notes (Signed)
Report received from M. Bullins RN. No change in assessment, will continue plan of care. Brinlyn Cena, Laurel Dimmer, RN

## 2021-02-20 ENCOUNTER — Ambulatory Visit
Admission: RE | Admit: 2021-02-20 | Discharge: 2021-02-20 | Disposition: A | Discharge status: Home / Self Care | Payer: Medicare Other | Source: Ambulatory Visit | Attending: Radiation Oncology | Admitting: Radiation Oncology

## 2021-02-20 ENCOUNTER — Telehealth (HOSPITAL_COMMUNITY): Payer: Self-pay | Admitting: *Deleted

## 2021-02-20 ENCOUNTER — Other Ambulatory Visit: Payer: Self-pay

## 2021-02-20 DIAGNOSIS — M4804 Spinal stenosis, thoracic region: Secondary | ICD-10-CM | POA: Diagnosis not present

## 2021-02-20 DIAGNOSIS — E43 Unspecified severe protein-calorie malnutrition: Secondary | ICD-10-CM | POA: Diagnosis not present

## 2021-02-20 DIAGNOSIS — Z466 Encounter for fitting and adjustment of urinary device: Secondary | ICD-10-CM | POA: Diagnosis not present

## 2021-02-20 DIAGNOSIS — C50412 Malignant neoplasm of upper-outer quadrant of left female breast: Secondary | ICD-10-CM | POA: Diagnosis not present

## 2021-02-20 DIAGNOSIS — Z79811 Long term (current) use of aromatase inhibitors: Secondary | ICD-10-CM | POA: Diagnosis not present

## 2021-02-20 DIAGNOSIS — G959 Disease of spinal cord, unspecified: Secondary | ICD-10-CM | POA: Insufficient documentation

## 2021-02-20 DIAGNOSIS — R634 Abnormal weight loss: Secondary | ICD-10-CM | POA: Diagnosis not present

## 2021-02-20 DIAGNOSIS — C7949 Secondary malignant neoplasm of other parts of nervous system: Secondary | ICD-10-CM | POA: Diagnosis not present

## 2021-02-20 DIAGNOSIS — Z79891 Long term (current) use of opiate analgesic: Secondary | ICD-10-CM | POA: Diagnosis not present

## 2021-02-20 DIAGNOSIS — Z170 Estrogen receptor positive status [ER+]: Secondary | ICD-10-CM | POA: Diagnosis not present

## 2021-02-20 DIAGNOSIS — R339 Retention of urine, unspecified: Secondary | ICD-10-CM | POA: Diagnosis not present

## 2021-02-20 DIAGNOSIS — C7981 Secondary malignant neoplasm of breast: Secondary | ICD-10-CM | POA: Diagnosis not present

## 2021-02-20 DIAGNOSIS — L89152 Pressure ulcer of sacral region, stage 2: Secondary | ICD-10-CM | POA: Diagnosis not present

## 2021-02-20 DIAGNOSIS — E876 Hypokalemia: Secondary | ICD-10-CM | POA: Diagnosis not present

## 2021-02-20 DIAGNOSIS — Z510 Encounter for antineoplastic radiation therapy: Secondary | ICD-10-CM | POA: Insufficient documentation

## 2021-02-20 DIAGNOSIS — K5900 Constipation, unspecified: Secondary | ICD-10-CM | POA: Diagnosis not present

## 2021-02-20 DIAGNOSIS — I739 Peripheral vascular disease, unspecified: Secondary | ICD-10-CM | POA: Diagnosis not present

## 2021-02-20 DIAGNOSIS — C7931 Secondary malignant neoplasm of brain: Secondary | ICD-10-CM | POA: Diagnosis not present

## 2021-02-20 DIAGNOSIS — G834 Cauda equina syndrome: Secondary | ICD-10-CM | POA: Diagnosis not present

## 2021-02-20 DIAGNOSIS — I10 Essential (primary) hypertension: Secondary | ICD-10-CM | POA: Diagnosis not present

## 2021-02-20 DIAGNOSIS — M5136 Other intervertebral disc degeneration, lumbar region: Secondary | ICD-10-CM | POA: Diagnosis not present

## 2021-02-20 DIAGNOSIS — E785 Hyperlipidemia, unspecified: Secondary | ICD-10-CM | POA: Diagnosis not present

## 2021-02-20 DIAGNOSIS — F32A Depression, unspecified: Secondary | ICD-10-CM | POA: Diagnosis not present

## 2021-02-20 NOTE — Telephone Encounter (Signed)
[name] was contacted by telephone to verify understanding of discharge instructions status post their most recent discharge from the hospital on the date:  02/19/21.  Inpatient discharge AVS was re-reviewed with patient and home health nurse, along with cancer center appointments.  Verification of understanding for oncology specific follow-up was validated using the Teach Back method.    Transportation to appointments were confirmed for the patient as being self/caregiver.  Leslie [name] questions were addressed to their satisfaction upon completion of this post discharge follow-up call for outpatient oncology.

## 2021-02-21 ENCOUNTER — Ambulatory Visit
Admission: RE | Admit: 2021-02-21 | Discharge: 2021-02-21 | Disposition: A | Discharge status: Home / Self Care | Payer: Medicare Other | Source: Ambulatory Visit | Attending: Radiation Oncology | Admitting: Radiation Oncology

## 2021-02-21 ENCOUNTER — Other Ambulatory Visit: Payer: Medicare Other

## 2021-02-21 ENCOUNTER — Ambulatory Visit: Payer: Medicare Other | Admitting: Hematology and Oncology

## 2021-02-21 ENCOUNTER — Telehealth: Payer: Self-pay | Admitting: *Deleted

## 2021-02-21 ENCOUNTER — Ambulatory Visit
Admit: 2021-02-21 | Discharge: 2021-02-21 | Disposition: A | Discharge status: Home / Self Care | Payer: Medicare Other | Attending: Radiation Oncology | Admitting: Radiation Oncology

## 2021-02-21 DIAGNOSIS — C50412 Malignant neoplasm of upper-outer quadrant of left female breast: Secondary | ICD-10-CM | POA: Diagnosis not present

## 2021-02-21 DIAGNOSIS — Z510 Encounter for antineoplastic radiation therapy: Secondary | ICD-10-CM | POA: Insufficient documentation

## 2021-02-21 DIAGNOSIS — Z79811 Long term (current) use of aromatase inhibitors: Secondary | ICD-10-CM | POA: Insufficient documentation

## 2021-02-21 DIAGNOSIS — Z170 Estrogen receptor positive status [ER+]: Secondary | ICD-10-CM | POA: Insufficient documentation

## 2021-02-21 DIAGNOSIS — C7949 Secondary malignant neoplasm of other parts of nervous system: Secondary | ICD-10-CM | POA: Diagnosis not present

## 2021-02-21 DIAGNOSIS — C7931 Secondary malignant neoplasm of brain: Secondary | ICD-10-CM | POA: Diagnosis not present

## 2021-02-21 LAB — CBC WITH DIFFERENTIAL (CANCER CENTER ONLY)
Abs Immature Granulocytes: 0.09 10*3/uL — ABNORMAL HIGH (ref 0.00–0.07)
Basophils Absolute: 0 10*3/uL (ref 0.0–0.1)
Basophils Relative: 0 %
Eosinophils Absolute: 0 10*3/uL (ref 0.0–0.5)
Eosinophils Relative: 0 %
HCT: 41.1 % (ref 36.0–46.0)
Hemoglobin: 13.8 g/dL (ref 12.0–15.0)
Immature Granulocytes: 1 %
Lymphocytes Relative: 7 %
Lymphs Abs: 0.5 10*3/uL — ABNORMAL LOW (ref 0.7–4.0)
MCH: 31.7 pg (ref 26.0–34.0)
MCHC: 33.6 g/dL (ref 30.0–36.0)
MCV: 94.5 fL (ref 80.0–100.0)
Monocytes Absolute: 0.9 10*3/uL (ref 0.1–1.0)
Monocytes Relative: 11 %
Neutro Abs: 6.3 10*3/uL (ref 1.7–7.7)
Neutrophils Relative %: 81 %
Platelet Count: 214 10*3/uL (ref 150–400)
RBC: 4.35 MIL/uL (ref 3.87–5.11)
RDW: 18.1 % — ABNORMAL HIGH (ref 11.5–15.5)
WBC Count: 7.8 10*3/uL (ref 4.0–10.5)
nRBC: 0 % (ref 0.0–0.2)

## 2021-02-21 LAB — CMP (CANCER CENTER ONLY)
ALT: 68 U/L — ABNORMAL HIGH (ref 0–44)
AST: 44 U/L — ABNORMAL HIGH (ref 15–41)
Albumin: 3.4 g/dL — ABNORMAL LOW (ref 3.5–5.0)
Alkaline Phosphatase: 108 U/L (ref 38–126)
Anion gap: 10 (ref 5–15)
BUN: 31 mg/dL — ABNORMAL HIGH (ref 8–23)
CO2: 28 mmol/L (ref 22–32)
Calcium: 9.1 mg/dL (ref 8.9–10.3)
Chloride: 95 mmol/L — ABNORMAL LOW (ref 98–111)
Creatinine: 0.65 mg/dL (ref 0.44–1.00)
GFR, Estimated: >60 mL/min (ref >60)
Glucose, Bld: 177 mg/dL — ABNORMAL HIGH (ref 70–99)
Potassium: 3.9 mmol/L (ref 3.5–5.1)
Sodium: 133 mmol/L — ABNORMAL LOW (ref 135–145)
Total Bilirubin: 0.7 mg/dL (ref 0.3–1.2)
Total Protein: 6.6 g/dL (ref 6.5–8.1)

## 2021-02-21 NOTE — Telephone Encounter (Signed)
Spoke with the patient to let her know that her lab work looked good and we would plan to repeat labs on 02/28/2021.  She verbalized understanding.    Gloriajean Dell. Leonie Green, BSN

## 2021-02-22 ENCOUNTER — Ambulatory Visit
Admission: RE | Admit: 2021-02-22 | Discharge: 2021-02-22 | Disposition: A | Discharge status: Home / Self Care | Payer: Medicare Other | Source: Ambulatory Visit | Attending: Radiation Oncology | Admitting: Radiation Oncology

## 2021-02-22 ENCOUNTER — Other Ambulatory Visit: Payer: Self-pay

## 2021-02-22 DIAGNOSIS — C7931 Secondary malignant neoplasm of brain: Secondary | ICD-10-CM | POA: Diagnosis not present

## 2021-02-22 DIAGNOSIS — G834 Cauda equina syndrome: Secondary | ICD-10-CM | POA: Diagnosis not present

## 2021-02-22 DIAGNOSIS — Z510 Encounter for antineoplastic radiation therapy: Secondary | ICD-10-CM | POA: Diagnosis not present

## 2021-02-22 DIAGNOSIS — M4804 Spinal stenosis, thoracic region: Secondary | ICD-10-CM | POA: Diagnosis not present

## 2021-02-22 DIAGNOSIS — Z79811 Long term (current) use of aromatase inhibitors: Secondary | ICD-10-CM | POA: Diagnosis not present

## 2021-02-22 DIAGNOSIS — M5136 Other intervertebral disc degeneration, lumbar region: Secondary | ICD-10-CM | POA: Diagnosis not present

## 2021-02-22 DIAGNOSIS — L89152 Pressure ulcer of sacral region, stage 2: Secondary | ICD-10-CM | POA: Diagnosis not present

## 2021-02-22 DIAGNOSIS — Z170 Estrogen receptor positive status [ER+]: Secondary | ICD-10-CM | POA: Diagnosis not present

## 2021-02-22 DIAGNOSIS — C7949 Secondary malignant neoplasm of other parts of nervous system: Secondary | ICD-10-CM | POA: Diagnosis not present

## 2021-02-22 DIAGNOSIS — C50412 Malignant neoplasm of upper-outer quadrant of left female breast: Secondary | ICD-10-CM | POA: Diagnosis not present

## 2021-02-22 DIAGNOSIS — C7981 Secondary malignant neoplasm of breast: Secondary | ICD-10-CM | POA: Diagnosis not present

## 2021-02-23 ENCOUNTER — Ambulatory Visit
Admission: RE | Admit: 2021-02-23 | Discharge: 2021-02-23 | Disposition: A | Discharge status: Home / Self Care | Payer: Medicare Other | Source: Ambulatory Visit | Attending: Radiation Oncology | Admitting: Radiation Oncology

## 2021-02-23 DIAGNOSIS — M5136 Other intervertebral disc degeneration, lumbar region: Secondary | ICD-10-CM | POA: Diagnosis not present

## 2021-02-23 DIAGNOSIS — G834 Cauda equina syndrome: Secondary | ICD-10-CM | POA: Diagnosis not present

## 2021-02-23 DIAGNOSIS — Z170 Estrogen receptor positive status [ER+]: Secondary | ICD-10-CM | POA: Diagnosis not present

## 2021-02-23 DIAGNOSIS — C7981 Secondary malignant neoplasm of breast: Secondary | ICD-10-CM | POA: Diagnosis not present

## 2021-02-23 DIAGNOSIS — Z510 Encounter for antineoplastic radiation therapy: Secondary | ICD-10-CM | POA: Diagnosis not present

## 2021-02-23 DIAGNOSIS — M4804 Spinal stenosis, thoracic region: Secondary | ICD-10-CM | POA: Diagnosis not present

## 2021-02-23 DIAGNOSIS — C7949 Secondary malignant neoplasm of other parts of nervous system: Secondary | ICD-10-CM | POA: Diagnosis not present

## 2021-02-23 DIAGNOSIS — C50412 Malignant neoplasm of upper-outer quadrant of left female breast: Secondary | ICD-10-CM | POA: Diagnosis not present

## 2021-02-23 DIAGNOSIS — Z79811 Long term (current) use of aromatase inhibitors: Secondary | ICD-10-CM | POA: Diagnosis not present

## 2021-02-23 DIAGNOSIS — C7931 Secondary malignant neoplasm of brain: Secondary | ICD-10-CM | POA: Diagnosis not present

## 2021-02-23 DIAGNOSIS — L89152 Pressure ulcer of sacral region, stage 2: Secondary | ICD-10-CM | POA: Diagnosis not present

## 2021-02-26 ENCOUNTER — Ambulatory Visit
Admission: RE | Admit: 2021-02-26 | Discharge: 2021-02-26 | Disposition: A | Discharge status: Home / Self Care | Payer: Medicare Other | Source: Ambulatory Visit | Attending: Radiation Oncology | Admitting: Radiation Oncology

## 2021-02-26 ENCOUNTER — Other Ambulatory Visit: Payer: Self-pay

## 2021-02-26 DIAGNOSIS — C7931 Secondary malignant neoplasm of brain: Secondary | ICD-10-CM | POA: Diagnosis not present

## 2021-02-26 DIAGNOSIS — C50412 Malignant neoplasm of upper-outer quadrant of left female breast: Secondary | ICD-10-CM | POA: Diagnosis not present

## 2021-02-26 DIAGNOSIS — Z79811 Long term (current) use of aromatase inhibitors: Secondary | ICD-10-CM | POA: Diagnosis not present

## 2021-02-26 DIAGNOSIS — Z170 Estrogen receptor positive status [ER+]: Secondary | ICD-10-CM | POA: Diagnosis not present

## 2021-02-26 DIAGNOSIS — C7949 Secondary malignant neoplasm of other parts of nervous system: Secondary | ICD-10-CM | POA: Diagnosis not present

## 2021-02-26 DIAGNOSIS — Z510 Encounter for antineoplastic radiation therapy: Secondary | ICD-10-CM | POA: Diagnosis not present

## 2021-02-27 ENCOUNTER — Ambulatory Visit
Admission: RE | Admit: 2021-02-27 | Discharge: 2021-02-27 | Disposition: A | Discharge status: Home / Self Care | Payer: Medicare Other | Source: Ambulatory Visit | Attending: Radiation Oncology | Admitting: Radiation Oncology

## 2021-02-27 DIAGNOSIS — C50412 Malignant neoplasm of upper-outer quadrant of left female breast: Secondary | ICD-10-CM | POA: Diagnosis not present

## 2021-02-27 DIAGNOSIS — C7931 Secondary malignant neoplasm of brain: Secondary | ICD-10-CM | POA: Diagnosis not present

## 2021-02-27 DIAGNOSIS — Z510 Encounter for antineoplastic radiation therapy: Secondary | ICD-10-CM | POA: Diagnosis not present

## 2021-02-27 DIAGNOSIS — Z79811 Long term (current) use of aromatase inhibitors: Secondary | ICD-10-CM | POA: Diagnosis not present

## 2021-02-27 DIAGNOSIS — Z170 Estrogen receptor positive status [ER+]: Secondary | ICD-10-CM | POA: Diagnosis not present

## 2021-02-27 DIAGNOSIS — C7949 Secondary malignant neoplasm of other parts of nervous system: Secondary | ICD-10-CM | POA: Diagnosis not present

## 2021-02-27 NOTE — Progress Notes (Signed)
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  Intent: Palliative  Radiation Treatment Dates: 02/15/2021 through 02/28/2021 Site Technique Total Dose (Gy) Dose per Fx (Gy) Completed Fx Beam Energies  Brain: Brain IMRT 30/30 3 10/10 6X  Cervical Spine: Spine_C-T sp IMRT 30/30 3 10/10 6X  Lumbar Spine: Spine_T-L sp IMRT 30/30 3 10/10 6X   Narrative: The patient tolerated radiation therapy relatively well. She developed fatigue and some nausea during therapy that was controlled with antiemetics. Her blood counts were stable during therapy and will be checked again next week.   Plan: The patient will receive a call in about one month from the radiation oncology department. She will continue follow up with Dr. Chryl Heck as well.   ________________________________________________    Carola Rhine, [facility]

## 2021-02-28 ENCOUNTER — Inpatient Hospital Stay (HOSPITAL_BASED_OUTPATIENT_CLINIC_OR_DEPARTMENT_OTHER): Payer: Medicare Other | Admitting: Hematology and Oncology

## 2021-02-28 ENCOUNTER — Encounter: Payer: Self-pay | Admitting: Hematology and Oncology

## 2021-02-28 ENCOUNTER — Inpatient Hospital Stay: Payer: Medicare Other

## 2021-02-28 ENCOUNTER — Other Ambulatory Visit: Payer: Self-pay | Admitting: *Deleted

## 2021-02-28 ENCOUNTER — Ambulatory Visit
Admission: RE | Admit: 2021-02-28 | Discharge: 2021-02-28 | Disposition: A | Discharge status: Home / Self Care | Payer: Medicare Other | Source: Ambulatory Visit | Attending: Radiation Oncology | Admitting: Radiation Oncology

## 2021-02-28 ENCOUNTER — Ambulatory Visit
Admit: 2021-02-28 | Discharge: 2021-02-28 | Disposition: A | Discharge status: Home / Self Care | Payer: Medicare Other | Attending: Radiation Oncology | Admitting: Radiation Oncology

## 2021-02-28 ENCOUNTER — Other Ambulatory Visit: Payer: Self-pay

## 2021-02-28 ENCOUNTER — Encounter: Payer: Self-pay | Admitting: Radiation Oncology

## 2021-02-28 VITALS — BP 131/65 | HR 74 | Temp 97.5°F | Resp 18 | Ht 64.0 in | Wt 97.9 lb

## 2021-02-28 DIAGNOSIS — C50412 Malignant neoplasm of upper-outer quadrant of left female breast: Secondary | ICD-10-CM

## 2021-02-28 DIAGNOSIS — C7951 Secondary malignant neoplasm of bone: Secondary | ICD-10-CM

## 2021-02-28 DIAGNOSIS — Z170 Estrogen receptor positive status [ER+]: Secondary | ICD-10-CM

## 2021-02-28 DIAGNOSIS — Z79811 Long term (current) use of aromatase inhibitors: Secondary | ICD-10-CM | POA: Insufficient documentation

## 2021-02-28 DIAGNOSIS — C7931 Secondary malignant neoplasm of brain: Secondary | ICD-10-CM | POA: Diagnosis not present

## 2021-02-28 DIAGNOSIS — C7949 Secondary malignant neoplasm of other parts of nervous system: Secondary | ICD-10-CM | POA: Diagnosis not present

## 2021-02-28 DIAGNOSIS — G96198 Other disorders of meninges, not elsewhere classified: Secondary | ICD-10-CM

## 2021-02-28 DIAGNOSIS — Z510 Encounter for antineoplastic radiation therapy: Secondary | ICD-10-CM | POA: Diagnosis not present

## 2021-02-28 LAB — CMP (CANCER CENTER ONLY)
ALT: 64 U/L — ABNORMAL HIGH (ref 0–44)
AST: 33 U/L (ref 15–41)
Albumin: 3.3 g/dL — ABNORMAL LOW (ref 3.5–5.0)
Alkaline Phosphatase: 92 U/L (ref 38–126)
Anion gap: 8 (ref 5–15)
BUN: 30 mg/dL — ABNORMAL HIGH (ref 8–23)
CO2: 27 mmol/L (ref 22–32)
Calcium: 9.2 mg/dL (ref 8.9–10.3)
Chloride: 98 mmol/L (ref 98–111)
Creatinine: 0.58 mg/dL (ref 0.44–1.00)
GFR, Estimated: >60 mL/min (ref >60)
Glucose, Bld: 195 mg/dL — ABNORMAL HIGH (ref 70–99)
Potassium: 4 mmol/L (ref 3.5–5.1)
Sodium: 133 mmol/L — ABNORMAL LOW (ref 135–145)
Total Bilirubin: 0.6 mg/dL (ref 0.3–1.2)
Total Protein: 6.4 g/dL — ABNORMAL LOW (ref 6.5–8.1)

## 2021-02-28 LAB — CBC WITH DIFFERENTIAL (CANCER CENTER ONLY)
Abs Immature Granulocytes: 0.05 10*3/uL (ref 0.00–0.07)
Basophils Absolute: 0 10*3/uL (ref 0.0–0.1)
Basophils Relative: 0 %
Eosinophils Absolute: 0 10*3/uL (ref 0.0–0.5)
Eosinophils Relative: 0 %
HCT: 38.7 % (ref 36.0–46.0)
Hemoglobin: 13.4 g/dL (ref 12.0–15.0)
Immature Granulocytes: 1 %
Lymphocytes Relative: 6 %
Lymphs Abs: 0.2 10*3/uL — ABNORMAL LOW (ref 0.7–4.0)
MCH: 32 pg (ref 26.0–34.0)
MCHC: 34.6 g/dL (ref 30.0–36.0)
MCV: 92.4 fL (ref 80.0–100.0)
Monocytes Absolute: 0.4 10*3/uL (ref 0.1–1.0)
Monocytes Relative: 11 %
Neutro Abs: 3.4 10*3/uL (ref 1.7–7.7)
Neutrophils Relative %: 82 %
Platelet Count: 132 10*3/uL — ABNORMAL LOW (ref 150–400)
RBC: 4.19 MIL/uL (ref 3.87–5.11)
RDW: 17.3 % — ABNORMAL HIGH (ref 11.5–15.5)
WBC Count: 4.2 10*3/uL (ref 4.0–10.5)
nRBC: 0 % (ref 0.0–0.2)

## 2021-02-28 MED ORDER — DEXAMETHASONE 4 MG PO TABS: 4.0000 mg | ORAL_TABLET | Freq: Two times a day (BID) | ORAL | 0 refills | Status: DC

## 2021-02-28 NOTE — Progress Notes (Signed)
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   She underwent restaging CT chest and bone scan yesterday, 01/03/2021. The scans have not been read yet.  She continues on anastrozole.  She does have some hot flashes and vaginal dryness related to this but no arthralgias or myalgias that we can ascertain.  She was also started on zolendronate on 11/09/2019.  Her most recent dose was 10/10/2020 and this is being repeated every 12 weeks.  She is due for dose today  She started palbociclib on 12/02/2019.  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  She arrived with her son to the appointment.  She is in a wheelchair, has an indwelling Foley catheter.  According to son and patient, her pain has been better controlled since she started radiation and since she has been taking pain medication and dexamethasone.  She however is not quite sure if she can void without the Foley catheter.  As she is able to move around a little bit and is able to do some activities of daily living. She denies any other new complaints since her last visit.  REVIEW OF SYSTEMS:  COVID 19 VACCINATION STATUS: Status post Moderna x2 with booster x2   HISTORY  OF CURRENT ILLNESS: From Dr. Eston [name] he patient evaluation note dated 12/07/2008:   "This is a pleasant 76 year old woman from [facility] referred by Leslie [name] for evaluation and treatment of breast cancer. This woman has been in relatively good health.  She undergoes annual screening mammography.  Her husband is a patient of Leslie [name] who has lung cancer.  She had a screening mammogram on 11/11/2008, which showed an abnormality.  A follow up diagnostic mammogram and right breast ultrasound was performed.  This showed a hyperdense spiculated mass 12 oclock left breast measuring 5 x 3 x 1.5 cm.  There is an associated abnormality seen on ultrasound as well.  Biopsy was recommended with BSGI scan performed 11/23/2008 showed an uptake with a 3 cm of intense isotope activity.  An ultrasound-guided biopsy of both the breast mass in the left breast as well as left axilla was performed on 11/23/2008, both of which show invasive mammary carcinoma.  Prognostic panel showed this to be ER positive at 99%, PR positive at 7%, proliferative index 17%, HER-2 was negative with ratio of 1.2.  An E-cadherin test was performed, which was negative suggestive of lobular phenotype.  The patient has subsequently been referred for a neoadjuvant therapy.  Of note is she had an MRI scan on 12/01/2008, which showed a 4.9 x 3.5 x 2.9 cm area of lobular carcinoma 12 oclock position left breast.  Axillary lymph nodes were also seen measuring 1.6 x 1.1 cm.  Multiple right breast cysts were also seen as well."   The patient's subsequent history is as detailed below.   PAST  MEDICAL HISTORY: Past Medical History:  Diagnosis Date   Benign breast cyst in female    Right breast   Cancer of left breast ([place]) 2010   Depression    History of blood transfusion    "related to tubal pregnancy"   Hypertension    PAD (peripheral artery disease) ([place])     PAST SURGICAL HISTORY: Past Surgical History:  Procedure Laterality Date    ABDOMINAL HYSTERECTOMY  1980s   BREAST CYST EXCISION Right ~ 2005   BREAST LUMPECTOMY Left 11/23/2008   ECTOPIC PREGNANCY SURGERY  1966   LOWER EXTREMITY ANGIOGRAM  06/19/2017   LOWER EXTREMITY ANGIOGRAPHY N/A 06/19/2017   Procedure: LOWER EXTREMITY ANGIOGRAPHY;  Surgeon: Leslie Harp, MD;  Location: [place] [facility];  Service: Cardiovascular;  Laterality: N/A;   PERIPHERAL VASCULAR BALLOON ANGIOPLASTY Left 06/19/2017   SFA    PERIPHERAL VASCULAR BALLOON ANGIOPLASTY  06/19/2017   Procedure: PERIPHERAL VASCULAR BALLOON ANGIOPLASTY;  Surgeon: Leslie Harp, MD;  Location: [place] [facility];  Service: Cardiovascular;;  SFA left  hysterectomy in her 63s secondary to tubal pregnancy, ovaries were intact   FAMILY HISTORY: Family History  Problem Relation Age of Onset   Hypertension Mother    Seizures Mother    Heart disease Father    Hypertension Sister    No history of breast or ovarian cancer in the family.  Father had a history of bad heart; mother died at age 61.    The patient has five sisters, two brothers, all of whom in good health.   GYNECOLOGIC HISTORY:  No LMP recorded. Patient has had a hysterectomy. Menarche: 76 years old Age at first live birth:    years old Leslie [name] 4 HRT yes  Hysterectomy? yes BSO? no   SOCIAL HISTORY: (updated 10/2019)  Leslie [name] "Leslie [name]" retired from working as a Engineer, mining for [employer]. She is widowed. Her husband of 13 years had a history of lung cancer. She lives by herself with no pets.  She has three grown sons that live in [place], [place], and [place].  The patient has 1 granddaughter, age 14, who is attending college studying to be an Chief Financial Officer.  The patient attends a [employer]    ADVANCED DIRECTIVES: not in place; the patient plans to name her son Leslie [name], who lives in [place] (and works in Pharmacist, hospital) as her healthcare power of attorney.  He can be reached at [phone]   HEALTH  MAINTENANCE: Social History   Tobacco Use   Smoking status: Every Day    Packs/day: 0.50    Years: 45.00    Pack years: 22.50    Types: Cigarettes   Smokeless tobacco: Never  Vaping Use   Vaping Use: Never used  Substance Use Topics   Alcohol use: Yes    Comment: 06/19/2017 "glass of wine 3-4 times/year"   Drug use: Never     Colonoscopy: none on file  PAP: 2012?  Bone density:    Allergies  Allergen Reactions   Aspirin Nausea And Vomiting   Cetirizine Other (See Comments)    Chest Pain    Rosuvastatin Other (See Comments)    MD suspended this due to bone pain    Current Outpatient Medications  Medication Sig Dispense Refill   anastrozole (ARIMIDEX) 1 MG tablet Take 1 tablet (1 mg total) by mouth daily. 90 tablet 4   atenolol-chlorthalidone (TENORETIC) 50-25 MG per tablet Take 0.5 tablets by mouth daily as needed (  for elevated B/P).     Cholecalciferol (VITAMIN D3) 10 MCG (400 UNIT) CAPS Take 400 Units by mouth daily with breakfast.     dexamethasone (DECADRON) 4 MG tablet Take 1 tablet (4 mg total) by mouth every 12 (twelve) hours. 20 tablet 0   Evening Primrose Oil 1000 MG CAPS Take 1,000 mg by mouth daily.     feeding supplement (ENSURE ENLIVE / ENSURE PLUS) LIQD Take 237 mLs by mouth 2 (two) times daily between meals. 237 mL 12   fluocinonide cream (LIDEX) 9.24 % Apply 1 application topically 2 (two) times daily as needed (for irritation- affected areas).     folic acid (FOLVITE) 268 MCG tablet Take 400 mcg by mouth daily.      lidocaine (LIDODERM) 5 % Place 1 patch onto the skin See admin instructions. Apply 1 patch to the lower back once a day and remove & discard the patch within 12 hours or as directed by MD 30 patch 0   mirtazapine (REMERON) 7.5 MG tablet Take 7.5 mg by mouth at bedtime.     montelukast (SINGULAIR) 10 MG tablet Take 10 mg by mouth at bedtime.     Multiple Vitamin (MULTIVITAMIN PO) Take 1 tablet by mouth daily with breakfast.     nitrofurantoin,  macrocrystal-monohydrate, (MACROBID) 100 MG capsule Take 1 capsule (100 mg total) by mouth at bedtime. 10 capsule 0   ondansetron (ZOFRAN) 8 MG tablet Take 8 mg by mouth every 8 (eight) hours as needed for nausea or vomiting. 20 tablet 0   oxyCODONE-acetaminophen (PERCOCET/ROXICET) 5-325 MG tablet Take 1 tablet by mouth every 6 (six) hours as needed for severe pain. 15 tablet 0   triamcinolone cream (KENALOG) 0.1 % Apply 1 application topically daily as needed (for itching- affectes sites).     TYLENOL 8 HOUR 650 MG CR tablet Take 650-1,300 mg by mouth every 8 (eight) hours as needed for pain.     vitamin E 400 UNIT capsule Take 400 Units by mouth daily.     No current facility-administered medications for this visit.    OBJECTIVE: African-American woman in no acute distress  There were no vitals filed for this visit.      There is no height or weight on file to calculate BMI.   Wt Readings from Last 3 Encounters:  02/19/21 101 lb 6.4 oz (46 kg)  01/04/21 109 lb 3 oz (49.5 kg)  11/09/20 110 lb 11.2 oz (50.2 kg)     ECOG FS:2 - Symptomatic, <50% confined to bed     Physical examination deferred in lieu of counseling.  Patient is frail, petite in a wheelchair. Indwelling Foley catheter noted.  No acute distress.  LAB RESULTS:  CMP     Component Value Date/Time   NA 133 (L) 02/21/2021 1158   NA 145 (H) 06/04/2017 1513   NA 141 02/09/2013 1056   K 3.9 02/21/2021 1158   K 3.4 (L) 02/09/2013 1056   CL 95 (L) 02/21/2021 1158   CL 102 06/03/2012 1106   CO2 28 02/21/2021 1158   CO2 27 02/09/2013 1056   GLUCOSE 177 (H) 02/21/2021 1158   GLUCOSE 134 02/09/2013 1056   GLUCOSE 101 (H) 06/03/2012 1106   BUN 31 (H) 02/21/2021 1158   BUN 11 06/04/2017 1513   BUN 11.0 02/09/2013 1056   CREATININE 0.65 02/21/2021 1158   CREATININE 0.9 02/09/2013 1056   CALCIUM 9.1 02/21/2021 1158   CALCIUM 9.8 02/09/2013 1056   PROT 6.6 02/21/2021  1158   PROT 7.2 10/06/2018 1005   PROT 7.4  02/09/2013 1056   ALBUMIN 3.4 (L) 02/21/2021 1158   ALBUMIN 4.4 10/06/2018 1005   ALBUMIN 4.0 02/09/2013 1056   AST 44 (H) 02/21/2021 1158   AST 19 02/09/2013 1056   ALT 68 (H) 02/21/2021 1158   ALT 20 02/09/2013 1056   ALKPHOS 108 02/21/2021 1158   ALKPHOS 68 02/09/2013 1056   BILITOT 0.7 02/21/2021 1158   BILITOT 0.49 02/09/2013 1056   GFRNONAA >60 02/21/2021 1158   GFRAA >60 06/20/2017 0249    Lab Results  Component Value Date   TOTALPROTELP 7.5 11/03/2019   ALBUMINELP 4.0 11/03/2019   A1GS 0.2 11/03/2019   A2GS 0.8 11/03/2019   BETS 1.1 11/03/2019   GAMS 1.4 11/03/2019   MSPIKE Not Observed 11/03/2019   SPEI Comment 11/03/2019    Lab Results  Component Value Date   WBC 7.8 02/21/2021   NEUTROABS 6.3 02/21/2021   HGB 13.8 02/21/2021   HCT 41.1 02/21/2021   MCV 94.5 02/21/2021   PLT 214 02/21/2021    Lab Results  Component Value Date   LABCA2 33 12/05/2011    No components found for: [id number]  No results for input(s): INR in the last 168 hours.  Lab Results  Component Value Date   LABCA2 33 12/05/2011    No results found for: CAN199  No results found for: CAN125  No results found for: WVP710  Lab Results  Component Value Date   CA2729 41.3 (H) 11/03/2019    No components found for: HGQUANT  Lab Results  Component Value Date   CEA1 2.33 11/03/2019   /  CEA ([facility])  Date Value Ref Range Status  11/03/2019 2.33 0.00 - 5.00 ng/mL Final    Comment:    (NOTE) This test was performed using Architect's Chemiluminescent Microparticle Immunoassay. Values obtained from different assay methods cannot be used interchangeably. Please note that 5-10% of patients who smoke may see CEA levels up to 6.9 ng/mL. Performed at [facility], [place] [address], [place], [place] [address]      No results found for: AFPTUMOR  No results found for: [facility]  Lab Results  Component Value Date   KPAFRELGTCHN 25.8 (H)  11/03/2019   LAMBDASER 17.7 11/03/2019   KAPLAMBRATIO 1.46 11/03/2019   (kappa/lambda light chains)  No results found for: HGBA, HGBA2QUANT, HGBFQUANT, HGBSQUAN (Hemoglobinopathy evaluation)   Lab Results  Component Value Date   LDH 182 06/03/2012    Lab Results  Component Value Date   IRON 40 02/08/2021   TIBC 280 02/08/2021   IRONPCTSAT 14 02/08/2021   (Iron and TIBC)  Lab Results  Component Value Date   FERRITIN 1,662 (H) 02/08/2021    Urinalysis    Component Value Date/Time   COLORURINE YELLOW 08/17/2010 1105   APPEARANCEUR CLEAR 08/17/2010 1105   LABSPEC 1.017 08/17/2010 1105   PHURINE 8.0 08/17/2010 1105   GLUCOSEU NEGATIVE 08/17/2010 1105   HGBUR NEGATIVE 08/17/2010 1105   BILIRUBINUR NEGATIVE 08/17/2010 1105   KETONESUR NEGATIVE 08/17/2010 1105   PROTEINUR NEGATIVE 08/17/2010 1105   UROBILINOGEN 0.2 08/17/2010 1105   NITRITE NEGATIVE 08/17/2010 1105   LEUKOCYTESUR NEGATIVE 08/17/2010 1105    STUDIES: DG Lumbar Spine Complete  Result Date: 02/05/2021 CLINICAL DATA:  Hip pain EXAM: LUMBAR SPINE - COMPLETE 4+ VIEW COMPARISON:  08/10/2019 FINDINGS: Levoscoliosis. Vertebral body heights are grossly maintained. 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 Multilevel degenerative change, advanced at  L2-L3. Facet degenerative changes at multiple levels. IMPRESSION: 1. Scoliosis with degenerative changes most advanced at L2-L3. 2. 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   On: 02/05/2021 15:33   CT HEAD WO CONTRAST (5MM)  Result Date: 02/08/2021 CLINICAL DATA:  Study prior to lumbar puncture EXAM: CT HEAD WITHOUT CONTRAST TECHNIQUE: Contiguous axial images were obtained from the base of the skull through the vertex without intravenous contrast. RADIATION DOSE REDUCTION: This exam was performed according to the departmental dose-optimization program which includes automated exposure  control, adjustment of the mA and/or kV according to patient size and/or use of iterative reconstruction technique. COMPARISON:  MR brain done on 09/13/2009 FINDINGS: Brain: No acute intracranial findings are seen. Ventricles are not dilated. Cortical sulci are prominent. There are no epidural or subdural fluid collections. There is decreased density in the periventricular white matter. Vascular: Unremarkable. Skull: Unremarkable. Sinuses/Orbits: Visualized paranasal sinuses are unremarkable. There is fluid in the right mastoid air cells. There is no break in the cortical margins in the right mastoid. Other: None IMPRESSION: No acute intracranial findings are seen. Atrophy. Small-vessel disease. There is fluid density in the right mastoid air cells suggesting right mastoid effusion or chronic right mastoiditis. Electronically Signed   By: Elmer Picker M.D.   On: 02/08/2021 12:43   CT CHEST W CONTRAST  Result Date: 02/08/2021 CLINICAL DATA:  Restaging metastatic breast cancer. EXAM: CT CHEST, ABDOMEN, AND PELVIS WITH CONTRAST TECHNIQUE: Multidetector CT imaging of the chest, abdomen and pelvis was performed following the standard protocol during bolus administration of intravenous contrast. RADIATION DOSE REDUCTION: This exam was performed according to the departmental dose-optimization program which includes automated exposure control, adjustment of the mA and/or kV according to patient size and/or use of iterative reconstruction technique. CONTRAST:  39mL OMNIPAQUE IOHEXOL 300 MG/ML  SOLN COMPARISON:  Chest CT 01/03/2021. Abdominal/pelvic CT scan 08/14/2018 FINDINGS: CT CHEST FINDINGS Cardiovascular: The heart is normal in size. No pericardial effusion. The aorta is normal in caliber. No dissection. Stable aortic and coronary artery calcifications. Mediastinum/Nodes: No mediastinal or hilar mass or lymphadenopathy. The esophagus is grossly normal. Lungs/Pleura: Stable emphysematous changes and areas of  pulmonary scarring. Small scattered perifissural nodules consistent with small lymph nodes. No worrisome pulmonary lesions to suggest pulmonary metastatic disease. There are bilateral pleural effusions, left larger than right with overlying atelectasis left greater than right. No obvious enhancing pleural nodules. Musculoskeletal: Overall stable appearing severe diffuse metastatic bone disease with mixed lytic and sclerotic changes and old pathologic fractures. I do not see any obvious new or progressive findings. The left paraspinal muscles are thickened and demonstrate heterogeneous enhancement findings suspicious for tumor involvement. CT ABDOMEN PELVIS FINDINGS Hepatobiliary: No hepatic lesions to suggest metastatic disease. The gallbladder is unremarkable. No intra or extrahepatic biliary dilatation. Pancreas: No mass, inflammation or ductal dilatation. Spleen: Normal size.  No focal lesions. Adrenals/Urinary Tract: The adrenal glands and kidneys are unremarkable and stable. Small renal cysts are noted. No worrisome renal lesions. The bladder is decompressed by a Foley catheter. Stomach/Bowel: Stomach, duodenum, small bowel and colon are grossly normal. Vascular/Lymphatic: Stable advanced atherosclerotic calcifications involving the aorta and iliac arteries but no aneurysm. Stable small scattered mesenteric and retroperitoneal lymph nodes but no mass or adenopathy. No findings suspicious for peritoneal carcinomatosis. Reproductive: Surgically absent. Other: No free air or free fluid. Musculoskeletal: Severe diffuse lytic and sclerotic metastatic bone disease markedly progressive since 2020 but I do not have any  more recent scans for comparison. There is a large lytic lesion involving the right acetabulum but I do not see a definite pathologic fracture. Lytic lesion involving the upper right iliac bone with destruction of the medial cortex. No obvious pathologic hip fracture. Large enhancing masslike area in the  left gluteus medius muscle worrisome for a muscle involvement, likely from direct extension. Other areas of abnormal muscle enhancement could suggest muscle lesions. IMPRESSION: 1. 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 2. Several areas of masslike enhancement in the muscles of the spine and pelvis worrisome for tumor involvement. 3. No findings suspicious for pulmonary metastatic disease. 4. Bilateral pleural effusions, left larger than right with overlying atelectasis left greater than right. 5. No findings suspicious for abdominal/pelvic metastatic disease. 6. Stable advanced atherosclerotic calcifications involving the thoracic and abdominal aorta and iliac arteries. Aortic Atherosclerosis (ICD10-I70.0) and Emphysema (ICD10-J43.9). Electronically Signed   By: Marijo Sanes M.D.   On: 02/08/2021 19:23   MR BRAIN W WO CONTRAST  Result Date: 02/09/2021 CLINICAL DATA:  Restaging metastatic cancer, history of breast cancer with widespread osseous metastatic disease EXAM: MRI HEAD WITHOUT AND WITH CONTRAST MRI CERVICAL SPINE WITHOUT AND WITH CONTRAST TECHNIQUE: Multiplanar, multiecho pulse sequences of the brain and surrounding structures, and cervical spine, to include the craniocervical junction and cervicothoracic junction, were obtained without and with intravenous contrast. CONTRAST:  40mL GADAVIST GADOBUTROL 1 MMOL/ML IV SOLN COMPARISON:  09/13/2009 MRI head, correlation is also made with 02/08/2021 CT head; no prior MRI of the cervical spine, correlation is made with 02/07/2021 MRI thoracic spine FINDINGS: MRI HEAD FINDINGS Brain: No restricted diffusion to suggest acute or subacute infarct. No acute hemorrhage, mass, mass effect, or midline shift. Leptomeningeal enhancement in the anterior temporal lobes (series 20, images 23 and 22), posterior left temporal lobe (series 20, image 22), along the anterior surface of the midbrain (series 28, image 25) and pons (series  30, image 12), and in the cerebellar folia (series 28, images 17-25). Enhancement of the medulla and brainstem are better seen on the cervical spine images (series 26, image 7). No hydrocephalus or extra-axial collection. Scattered T2 hyperintense signal in the periventricular white matter, likely the sequela of mild chronic small vessel ischemic disease. No cerebral edema. Vascular: Normal flow voids. Skull and upper cervical spine: In the midline/left parietal calvarium, there is a 10 x 5 x 8 mm mildly enhancing lesion (series 28, image 35 and series 30, image 14). Otherwise normal marrow signal. Sinuses/Orbits: No acute finding. Status post bilateral lens replacements. Other: Fluid throughout the right mastoid air cells. MRI CERVICAL SPINE FINDINGS Evaluation is somewhat limited by motion artifact. Alignment: Straightening of the normal cervical lordosis. No listhesis. Vertebrae: Abnormal T1 signal with patchy enhancement, for example in C2 and C6 consistent with known extensive osseous metastatic disease. No evidence of pathologic fracture. For findings in the thoracic spine, see 02/07/2021 MRI thoracic spine. Cord: Leptomeningeal enhancement along the brainstem and majority of the cervical spinal cord (series 26, image 7 and series 27, image 13). The spinal cord demonstrates increased T2 signal centrally from the level of C2 and extending inferiorly throughout the imaged spinal cord, although this is most prominent from the level of C5 extending inferiorly (series 25, image 7 and series 22, image 17). From C6 through C7-T1, there also appears to be prominence of the central canal of the spinal cord (series 22, images 21-27). Posterior Fossa, vertebral arteries, paraspinal tissues: For findings related to  the pons and cerebellum, please see MRI brain findings above. Disc levels: Degenerative changes without significant spinal canal stenosis. IMPRESSION: MRI BRAIN: 1. Leptomeningeal enhancement along the temporal  lobes, cerebellum, and along the anterior surface of the midbrain and pons, concerning for leptomeningeal metastatic disease. 2. No abnormal parenchymal enhancement to suggest parenchymal metastatic disease. 3. Focus of enhancement in the left parietal calvarium is favored to be benign. Attention on follow-up. MRI CERVICAL SPINE 1. Leptomeningeal enhancement along the brainstem and the majority of the cervical spinal cord, with increased T2 signal in the cervical spinal cord, most likely edema. In addition, there may be prominence of the central canal from C6 through C7-T1. 2. Known osseous metastatic disease, without evidence of pathologic fracture. Electronically Signed   By: Merilyn Baba M.D.   On: 02/09/2021 14:28   MR CERVICAL SPINE W WO CONTRAST  Result Date: 02/09/2021 CLINICAL DATA:  Restaging metastatic cancer, history of breast cancer with widespread osseous metastatic disease EXAM: MRI HEAD WITHOUT AND WITH CONTRAST MRI CERVICAL SPINE WITHOUT AND WITH CONTRAST TECHNIQUE: Multiplanar, multiecho pulse sequences of the brain and surrounding structures, and cervical spine, to include the craniocervical junction and cervicothoracic junction, were obtained without and with intravenous contrast. CONTRAST:  9mL GADAVIST GADOBUTROL 1 MMOL/ML IV SOLN COMPARISON:  09/13/2009 MRI head, correlation is also made with 02/08/2021 CT head; no prior MRI of the cervical spine, correlation is made with 02/07/2021 MRI thoracic spine FINDINGS: MRI HEAD FINDINGS Brain: No restricted diffusion to suggest acute or subacute infarct. No acute hemorrhage, mass, mass effect, or midline shift. Leptomeningeal enhancement in the anterior temporal lobes (series 20, images 23 and 22), posterior left temporal lobe (series 20, image 22), along the anterior surface of the midbrain (series 28, image 25) and pons (series 30, image 12), and in the cerebellar folia (series 28, images 17-25). Enhancement of the medulla and brainstem are  better seen on the cervical spine images (series 26, image 7). No hydrocephalus or extra-axial collection. Scattered T2 hyperintense signal in the periventricular white matter, likely the sequela of mild chronic small vessel ischemic disease. No cerebral edema. Vascular: Normal flow voids. Skull and upper cervical spine: In the midline/left parietal calvarium, there is a 10 x 5 x 8 mm mildly enhancing lesion (series 28, image 35 and series 30, image 14). Otherwise normal marrow signal. Sinuses/Orbits: No acute finding. Status post bilateral lens replacements. Other: Fluid throughout the right mastoid air cells. MRI CERVICAL SPINE FINDINGS Evaluation is somewhat limited by motion artifact. Alignment: Straightening of the normal cervical lordosis. No listhesis. Vertebrae: Abnormal T1 signal with patchy enhancement, for example in C2 and C6 consistent with known extensive osseous metastatic disease. No evidence of pathologic fracture. For findings in the thoracic spine, see 02/07/2021 MRI thoracic spine. Cord: Leptomeningeal enhancement along the brainstem and majority of the cervical spinal cord (series 26, image 7 and series 27, image 13). The spinal cord demonstrates increased T2 signal centrally from the level of C2 and extending inferiorly throughout the imaged spinal cord, although this is most prominent from the level of C5 extending inferiorly (series 25, image 7 and series 22, image 17). From C6 through C7-T1, there also appears to be prominence of the central canal of the spinal cord (series 22, images 21-27). Posterior Fossa, vertebral arteries, paraspinal tissues: For findings related to the pons and cerebellum, please see MRI brain findings above. Disc levels: Degenerative changes without significant spinal canal stenosis. IMPRESSION: MRI BRAIN: 1. 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 2. No abnormal parenchymal enhancement to suggest parenchymal metastatic disease. 3. Focus of enhancement in the left parietal calvarium is favored to be benign. Attention on follow-up. MRI CERVICAL SPINE 1. Leptomeningeal enhancement along the brainstem and the majority of the cervical spinal cord, with increased T2 signal in the cervical spinal cord, most likely edema. In addition, there may be prominence of the central canal from C6 through C7-T1. 2. Known osseous metastatic disease, without evidence of pathologic fracture. Electronically Signed   By: Merilyn Baba M.D.   On: 02/09/2021 14:28   MR THORACIC SPINE W WO CONTRAST  Result Date: 02/07/2021 CLINICAL DATA:  Back pain. Difficulty with urination. History of breast cancer with widespread osseous metastatic disease. EXAM: MRI THORACIC AND LUMBAR SPINE WITHOUT AND WITH CONTRAST TECHNIQUE: Multiplanar and multiecho pulse sequences of the thoracic and lumbar spine were obtained without and with intravenous contrast. CONTRAST:  19mL GADAVIST GADOBUTROL 1 MMOL/ML IV SOLN COMPARISON:  Lumbar spine radiographs 02/05/2021. Nuclear medicine whole-body bone scan 01/03/2021. Chest CT 01/03/2021. Lumbar spine MRI 10/04/2019. FINDINGS: MRI THORACIC SPINE FINDINGS The study is moderately motion degraded, greatest on axial sequences. Alignment: Mild lower thoracic dextroscoliosis. No significant listhesis. Vertebrae: Known widespread metastatic disease throughout the vertebral bodies and posterior elements at every thoracic level as well as in the included cervical spine. Pathologic T4 compression fracture with mild vertebral body height loss, similar to the prior chest CT. A T9 superior endplate Schmorl's node is unchanged. There is evidence of epidural tumor in the lower thoracic spine, greatest at T10-11 where there is resultant moderate spinal stenosis. The craniocaudal extent of epidural tumor is difficult to precisely determine although it may  extend cranially to approximately T7-8. Cord: Abnormal enhancement along the surface of the spinal cord at least throughout the mid and upper thoracic spine with extension towards the cervical region. Abnormal T2 hyperintensity centrally in the upper and, to a lesser extent, mid thoracic spinal cord which could reflect edema or a syrinx extending towards the cervical region and measuring up to approximately 4 mm in diameter near the cervicothoracic junction. Additional edema in the lower thoracic spinal cord. Paraspinal and other soft tissues: Small left pleural effusion, larger than on the prior chest CT. Widespread STIR hyperintensity and enhancement of the left greater than right posterior paraspinal musculature. Disc levels: Disc degeneration greatest at T10-11 and T11-12 where there is asymmetrically severe left-sided disc space narrowing. Up to moderate lower thoracic spinal stenosis due to epidural tumor. MRI LUMBAR SPINE FINDINGS Segmentation:  Standard. Alignment: Moderate lumbar levoscoliosis. No significant listhesis. Vertebrae: Chronic L4 Schmorl's nodes and mild chronic L3 compression fracture. Known widespread osseous metastatic disease throughout the lumbar spine and included pelvis. Epidural tumor at the thoracolumbar junction as noted above on the thoracic spine MRI. Mildly prominent epidural enhancement throughout the lumbar spine more caudally, indeterminate for epidural tumor but without a dominant epidural mass in the lumbar spine. Epidural tumor is suspected in the sacrum involving left-sided neural foramina. Prominent perineural cysts at multiple levels as previously seen. Conus medullaris: Extends to the T12-L1 level and demonstrates edema as noted above. There is abnormal nodular enhancement along the surface of the distal spinal cord/conus, and there also appears to be mild enhancement of cauda equina nerve roots in the lower lumbar and sacral canal. Paraspinal and other soft tissues:  Chronic asymmetric left psoas and left greater than right posterior paraspinal  muscle atrophy. Partially visualized extensive gluteal musculature enhancement bilaterally. Small right renal cyst. Disc levels: L1-2: Disc bulging and mild facet and ligamentum flavum hypertrophy result in moderate right lateral recess stenosis and moderate right and mild left neural foraminal stenosis, similar to the prior MRI. Borderline to mild spinal stenosis. L2-3: Disc bulging and moderate facet and ligamentum flavum hypertrophy result in mild right lateral recess stenosis and severe right neural foraminal stenosis, similar to the prior MRI. Borderline spinal stenosis. L3-4: Right eccentric disc bulging and mild-to-moderate facet and ligamentum flavum hypertrophy result in borderline to mild right lateral recess stenosis and mild right neural foraminal stenosis without spinal stenosis, similar to the prior MRI. L4-5: Minimal disc bulging without stenosis. L5-S1: Negative. IMPRESSION: 1. Known widespread metastatic disease throughout the thoracic and lumbar spine. 2. 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 3. Abnormal enhancement relatively diffusely along the surface of the thoracic spinal cord with suspected mild involvement of the cauda equina nerve roots compatible with left meningeal disease. 4. Edema in the distal spinal cord/conus as well as edema and/or syrinx in the upper thoracic cord extending into the cervical region. 5. Widespread edema/enhancement involving the thoracic paraspinal musculature and bilateral gluteal musculature, potentially denervation or rhabdomyolysis. 6. Small left pleural effusion. Electronically Signed   By: Logan Bores M.D.   On: 02/07/2021 16:11   MR Lumbar Spine W Wo Contrast  Result Date: 02/07/2021 CLINICAL DATA:  Back pain. Difficulty with urination. History of breast cancer with widespread osseous metastatic disease. EXAM: MRI THORACIC  AND LUMBAR SPINE WITHOUT AND WITH CONTRAST TECHNIQUE: Multiplanar and multiecho pulse sequences of the thoracic and lumbar spine were obtained without and with intravenous contrast. CONTRAST:  60mL GADAVIST GADOBUTROL 1 MMOL/ML IV SOLN COMPARISON:  Lumbar spine radiographs 02/05/2021. Nuclear medicine whole-body bone scan 01/03/2021. Chest CT 01/03/2021. Lumbar spine MRI 10/04/2019. FINDINGS: MRI THORACIC SPINE FINDINGS The study is moderately motion degraded, greatest on axial sequences. Alignment: Mild lower thoracic dextroscoliosis. No significant listhesis. Vertebrae: Known widespread metastatic disease throughout the vertebral bodies and posterior elements at every thoracic level as well as in the included cervical spine. Pathologic T4 compression fracture with mild vertebral body height loss, similar to the prior chest CT. A T9 superior endplate Schmorl's node is unchanged. There is evidence of epidural tumor in the lower thoracic spine, greatest at T10-11 where there is resultant moderate spinal stenosis. The craniocaudal extent of epidural tumor is difficult to precisely determine although it may extend cranially to approximately T7-8. Cord: Abnormal enhancement along the surface of the spinal cord at least throughout the mid and upper thoracic spine with extension towards the cervical region. Abnormal T2 hyperintensity centrally in the upper and, to a lesser extent, mid thoracic spinal cord which could reflect edema or a syrinx extending towards the cervical region and measuring up to approximately 4 mm in diameter near the cervicothoracic junction. Additional edema in the lower thoracic spinal cord. Paraspinal and other soft tissues: Small left pleural effusion, larger than on the prior chest CT. Widespread STIR hyperintensity and enhancement of the left greater than right posterior paraspinal musculature. Disc levels: Disc degeneration greatest at T10-11 and T11-12 where there is asymmetrically severe  left-sided disc space narrowing. Up to moderate lower thoracic spinal stenosis due to epidural tumor. MRI LUMBAR SPINE FINDINGS Segmentation:  Standard. Alignment: Moderate lumbar levoscoliosis. No significant listhesis. Vertebrae: Chronic L4 Schmorl's nodes and mild chronic L3 compression fracture. Known widespread osseous metastatic disease  throughout the lumbar spine and included pelvis. Epidural tumor at the thoracolumbar junction as noted above on the thoracic spine MRI. Mildly prominent epidural enhancement throughout the lumbar spine more caudally, indeterminate for epidural tumor but without a dominant epidural mass in the lumbar spine. Epidural tumor is suspected in the sacrum involving left-sided neural foramina. Prominent perineural cysts at multiple levels as previously seen. Conus medullaris: Extends to the T12-L1 level and demonstrates edema as noted above. There is abnormal nodular enhancement along the surface of the distal spinal cord/conus, and there also appears to be mild enhancement of cauda equina nerve roots in the lower lumbar and sacral canal. Paraspinal and other soft tissues: Chronic asymmetric left psoas and left greater than right posterior paraspinal muscle atrophy. Partially visualized extensive gluteal musculature enhancement bilaterally. Small right renal cyst. Disc levels: L1-2: Disc bulging and mild facet and ligamentum flavum hypertrophy result in moderate right lateral recess stenosis and moderate right and mild left neural foraminal stenosis, similar to the prior MRI. Borderline to mild spinal stenosis. L2-3: Disc bulging and moderate facet and ligamentum flavum hypertrophy result in mild right lateral recess stenosis and severe right neural foraminal stenosis, similar to the prior MRI. Borderline spinal stenosis. L3-4: Right eccentric disc bulging and mild-to-moderate facet and ligamentum flavum hypertrophy result in borderline to mild right lateral recess stenosis and mild  right neural foraminal stenosis without spinal stenosis, similar to the prior MRI. L4-5: Minimal disc bulging without stenosis. L5-S1: Negative. IMPRESSION: 1. Known widespread metastatic disease throughout the thoracic and lumbar spine. 2. 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 3. Abnormal enhancement relatively diffusely along the surface of the thoracic spinal cord with suspected mild involvement of the cauda equina nerve roots compatible with left meningeal disease. 4. Edema in the distal spinal cord/conus as well as edema and/or syrinx in the upper thoracic cord extending into the cervical region. 5. Widespread edema/enhancement involving the thoracic paraspinal musculature and bilateral gluteal musculature, potentially denervation or rhabdomyolysis. 6. Small left pleural effusion. Electronically Signed   By: Logan Bores M.D.   On: 02/07/2021 16:11   CT ABDOMEN PELVIS W CONTRAST  Result Date: 02/08/2021 CLINICAL DATA:  Restaging metastatic breast cancer. EXAM: CT CHEST, ABDOMEN, AND PELVIS WITH CONTRAST TECHNIQUE: Multidetector CT imaging of the chest, abdomen and pelvis was performed following the standard protocol during bolus administration of intravenous contrast. RADIATION DOSE REDUCTION: This exam was performed according to the departmental dose-optimization program which includes automated exposure control, adjustment of the mA and/or kV according to patient size and/or use of iterative reconstruction technique. CONTRAST:  85mL OMNIPAQUE IOHEXOL 300 MG/ML  SOLN COMPARISON:  Chest CT 01/03/2021. Abdominal/pelvic CT scan 08/14/2018 FINDINGS: CT CHEST FINDINGS Cardiovascular: The heart is normal in size. No pericardial effusion. The aorta is normal in caliber. No dissection. Stable aortic and coronary artery calcifications. Mediastinum/Nodes: No mediastinal or hilar mass or lymphadenopathy. The esophagus is grossly normal. Lungs/Pleura: Stable  emphysematous changes and areas of pulmonary scarring. Small scattered perifissural nodules consistent with small lymph nodes. No worrisome pulmonary lesions to suggest pulmonary metastatic disease. There are bilateral pleural effusions, left larger than right with overlying atelectasis left greater than right. No obvious enhancing pleural nodules. Musculoskeletal: Overall stable appearing severe diffuse metastatic bone disease with mixed lytic and sclerotic changes and old pathologic fractures. I do not see any obvious new or progressive findings. The left paraspinal muscles are thickened and demonstrate heterogeneous enhancement findings suspicious for tumor involvement.  CT ABDOMEN PELVIS FINDINGS Hepatobiliary: No hepatic lesions to suggest metastatic disease. The gallbladder is unremarkable. No intra or extrahepatic biliary dilatation. Pancreas: No mass, inflammation or ductal dilatation. Spleen: Normal size.  No focal lesions. Adrenals/Urinary Tract: The adrenal glands and kidneys are unremarkable and stable. Small renal cysts are noted. No worrisome renal lesions. The bladder is decompressed by a Foley catheter. Stomach/Bowel: Stomach, duodenum, small bowel and colon are grossly normal. Vascular/Lymphatic: Stable advanced atherosclerotic calcifications involving the aorta and iliac arteries but no aneurysm. Stable small scattered mesenteric and retroperitoneal lymph nodes but no mass or adenopathy. No findings suspicious for peritoneal carcinomatosis. Reproductive: Surgically absent. Other: No free air or free fluid. 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 There is a large lytic lesion involving the right acetabulum but I do not see a definite pathologic fracture. Lytic lesion involving the upper right iliac bone with destruction of the medial cortex. No obvious pathologic hip fracture.  Large enhancing masslike area in the left gluteus medius muscle worrisome for a muscle involvement, likely from direct extension. Other areas of abnormal muscle enhancement could suggest muscle lesions. IMPRESSION: 1. 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 2. Several areas of masslike enhancement in the muscles of the spine and pelvis worrisome for tumor involvement. 3. No findings suspicious for pulmonary metastatic disease. 4. Bilateral pleural effusions, left larger than right with overlying atelectasis left greater than right. 5. No findings suspicious for abdominal/pelvic metastatic disease. 6. Stable advanced atherosclerotic calcifications involving the thoracic and abdominal aorta and iliac arteries. Aortic Atherosclerosis (ICD10-I70.0) and Emphysema (ICD10-J43.9). Electronically Signed   By: Marijo Sanes M.D.   On: 02/08/2021 19:23   DG Chest Portable 1 View  Result Date: 02/07/2021 CLINICAL DATA:  Shortness of breath, difficulty urinating EXAM: PORTABLE CHEST 1 VIEW COMPARISON:  10/11/2020 FINDINGS: Mild cardiomegaly. Mild, diffuse bilateral interstitial pulmonary opacity. Small right pleural effusion. Unchanged elevation of the left hemidiaphragm with associated atelectasis or consolidation. The visualized skeletal structures are unremarkable. IMPRESSION: 1. Mild, diffuse bilateral interstitial pulmonary opacity, most consistent with edema. No focal airspace opacity. 2. Small right pleural effusion. 3. Unchanged elevation of the left hemidiaphragm with associated atelectasis or consolidation. Electronically Signed   By: Delanna Ahmadi M.D.   On: 02/07/2021 16:44   VAS [place] ABI WITH/WO TBI  Result Date: 02/01/2021  LOWER EXTREMITY DOPPLER STUDY Patient Name:  FLOREAN HOOBLER  Date of Exam:   02/01/2021 Medical Rec #: [id number]        Accession #:    [id number] Date of Birth: 03/03/1945         Patient Gender: F Patient Age:   27 years Exam Location:  [facility]  Procedure:      VAS [place] ABI WITH/WO TBI Referring Phys: Roderic Palau BERRY --------------------------------------------------------------------------------  Indications: Peripheral artery disease, and left SFA intervention. Patient does              not report any claudication symptoms or rest pain. She does report              bilateral leg pain but cancer has come back and now its in her              bones and she believes the pain is coming from that as well as the              medication  she is currently taking for it. High Risk Factors: Hypertension, hyperlipidemia, current smoker.  Vascular Interventions: Successful chocolate balloon angioplasty followed by                         drug-eluting balloon angioplasty of a high-grade mid                         left SFA stenosis with an excellent angiographic result                         on 06/19/2017. Comparison Study: In 01/2020, a lower arterial Doppler showed an ABI of 1.05,                   bilaterally. Performing Technologist: Sharlett Iles RVT  Examination Guidelines: A complete evaluation includes at minimum, Doppler waveform signals and systolic blood pressure reading at the level of bilateral brachial, anterior tibial, and posterior tibial arteries, when vessel segments are accessible. Bilateral testing is considered an integral part of a complete examination. Photoelectric Plethysmograph (PPG) waveforms and toe systolic pressure readings are included as required and additional duplex testing as needed. Limited examinations for reoccurring indications may be performed as noted.  ABI Findings: +---------+------------------+-----+---------+--------+  Right     Rt Pressure (mmHg) Index Waveform  Comment   +---------+------------------+-----+---------+--------+  Brachial  129                                          +---------+------------------+-----+---------+--------+  PTA       143                1.09  triphasic            +---------+------------------+-----+---------+--------+  PERO      152                      triphasic 1.16      +---------+------------------+-----+---------+--------+  DP        146                1.11  triphasic           +---------+------------------+-----+---------+--------+  Great Toe 133                1.02  Normal              +---------+------------------+-----+---------+--------+ +---------+------------------+-----+---------+---------+  Left      Lt Pressure (mmHg) Index Waveform  Comment    +---------+------------------+-----+---------+---------+  Brachial  131                                           +---------+------------------+-----+---------+---------+  PTA                                absent    inaudible  +---------+------------------+-----+---------+---------+  PERO      141                      triphasic 1.08       +---------+------------------+-----+---------+---------+  DP        130  0.99  triphasic            +---------+------------------+-----+---------+---------+  Great Toe 99                 0.76  Normal               +---------+------------------+-----+---------+---------+ +-------+-----------+-----------+------------+------------+  ABI/TBI Today's ABI Today's TBI Previous ABI Previous TBI  +-------+-----------+-----------+------------+------------+  Right   1.16        1.02        1.05         .81           +-------+-----------+-----------+------------+------------+  Left    1.08        .76         1.05         .62           +-------+-----------+-----------+------------+------------+  Bilateral ABIs and right TBIs appear essentially unchanged compared to prior study on 02/02/2020. Left TBIs appear increased compared to prior study on 02/02/2020.  Summary: Right: Resting right ankle-brachial index is within normal range. No evidence of significant right lower extremity arterial disease. The right toe-brachial index is normal. Left: Resting left ankle-brachial index is  within normal range. No evidence of significant left lower extremity arterial disease. The left toe-brachial index is normal.  *See table(s) above for measurements and observations. See LE Arterial duplex report. Suggest follow up study in 12 months. Electronically signed by Quay Burow MD on 02/01/2021 at 6:17:26 PM.    Final    DG Hip Unilat W or Wo Pelvis 2-3 Views Left  Result Date: 02/05/2021 CLINICAL DATA:  Hip pain EXAM: DG HIP (WITH OR WITHOUT PELVIS) 2-3V LEFT COMPARISON:  Bone scan 01/03/2021 FINDINGS: SI joints are non widened. Pubic symphysis and rami appear intact. 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 No fracture or malalignment. IMPRESSION: 1. No acute osseous abnormality. 2. 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   On: 02/05/2021 15:23   DG Hip Unilat W or Wo Pelvis 2-3 Views Right  Result Date: 02/05/2021 CLINICAL DATA:  Hip pain EXAM: DG HIP (WITH OR WITHOUT PELVIS) 2-3V RIGHT COMPARISON:  Bone scan 01/03/2021 FINDINGS: SI joints are non widened. Pubic symphysis is intact. No fracture or malalignment. Heterogeneous sclerosis within the femurs and pelvic bones consistent with history of skeletal osseous metastatic disease IMPRESSION: 1. No acute osseous abnormality 2. Heterogeneous sclerosis within the pelvic bones and femurs consistent with history of skeletal metastatic disease Electronically Signed   By: Donavan Foil M.D.   On: 02/05/2021 15:22   VAS [place] LOWER EXTREMITY ARTERIAL DUPLEX  Result Date: 02/01/2021 LOWER EXTREMITY ARTERIAL DUPLEX STUDY Patient Name:  DANEA MANTER  Date of Exam:   02/01/2021 Medical Rec #: [id number]        Accession #:    [id number] Date of Birth: 10/03/1945         Patient Gender: F Patient Age:   80 years Exam Location:  [facility] Procedure:      VAS [place] LOWER EXTREMITY ARTERIAL DUPLEX Referring Phys: Roderic Palau BERRY  --------------------------------------------------------------------------------  Indications: Peripheral artery disease, and left SFA intervention. Patient does              not report any claudication symptoms or rest pain. She does report              bilateral leg pain but cancer has come back and  now its in her              bones and she believes the pain is coming from that as well as the              medication she is currently taking for it. High Risk Factors: Hypertension, hyperlipidemia, current smoker.  Vascular Interventions: Successful chocolate balloon angioplasty followed by                         drug-eluting balloon angioplasty of a high-grade mid                         left SFA stenosis with an excellent angiographic result                         on 06/19/2017. Current ABI:            1.16 on the right and 1.08 on the left. Comparison Study: In 01/2020, a lower arterial duplex showed 30-49% stenosis in                   the left proximal CFA. Patent left mid SFA without evidence of                   restenosis, s/p angioplasty. Performing Technologist: Sharlett Iles RVT  Examination Guidelines: A complete evaluation includes B-mode imaging, spectral Doppler, color Doppler, and power Doppler as needed of all accessible portions of each vessel. Bilateral testing is considered an integral part of a complete examination. Limited examinations for reoccurring indications may be performed as noted.   +----------+--------+-----+---------------+-----------+-------------+  LEFT       PSV cm/s Ratio Stenosis        Waveform    Comments       +----------+--------+-----+---------------+-----------+-------------+  CFA Prox   214            50-74% stenosis triphasic   low end range  +----------+--------+-----+---------------+-----------+-------------+  CFA Mid    191                            triphasic                  +----------+--------+-----+---------------+-----------+-------------+  CFA Distal 148                             triphasic                  +----------+--------+-----+---------------+-----------+-------------+  DFA        283                            triphasic   turbulent      +----------+--------+-----+---------------+-----------+-------------+  SFA Prox   155                            biphasic                   +----------+--------+-----+---------------+-----------+-------------+  SFA Mid    118                            triphasic                  +----------+--------+-----+---------------+-----------+-------------+  SFA Distal 166                            multiphasic                +----------+--------+-----+---------------+-----------+-------------+  POP Prox   144                            triphasic                  +----------+--------+-----+---------------+-----------+-------------+  POP Mid    145                            triphasic                  +----------+--------+-----+---------------+-----------+-------------+  POP Distal 153                            biphasic                   +----------+--------+-----+---------------+-----------+-------------+  TP Trunk   108                            triphasic                  +----------+--------+-----+---------------+-----------+-------------+  Summary: Left: No significant change as compared to previous study. Heterogeneous plaque throughout. 50-74% stenosis in the proximal CFA, low end range. Patent mid SFA without evidence of restenosis, s/p angioplasty.  See table(s) above for measurements and observations. See ABI report. Suggest follow up study in 12 months. Electronically signed by Quay Burow MD on 02/01/2021 at 6:17:09 PM.    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 EXAM: DIAGNOSTIC LUMBAR PUNCTURE UNDER FLUOROSCOPIC GUIDANCE COMPARISON:  Spine MRI 02/07/2021. Today's head CT, dictated separately. FLUOROSCOPY TIME:   Fluoroscopy Time:  2 minutes and 48 seconds Radiation Exposure Index (if provided by the fluoroscopic device): 14.9 mGy Number of Acquired Spot Images: 0 PROCEDURE: Informed consent was obtained from the patient prior to the procedure, including potential complications of headache, allergy, and pain. With the patient prone, the lower back was prepped with Betadine. 1% Lidocaine was used for local anesthesia. Lumbar puncture was performed at the L4-5 level using a 20 gauge needle with return of clear CSF with an opening pressure of 9 cm water. After obtaining 2 vials each with 2 cc of clear CSF, the return of CSF became sanguinous. 1 cc of sanguinous CSF was obtained. At this point, the tubing was removed and the stylette placed in order to attempt to clear the CSF flow. Despite multiple maneuvers, CSF return could not be obtained a second time. The patient tolerated the procedure well and there were no apparent complications. IMPRESSION: Successful lumbar puncture, as detailed above. Only a total of 5 cc of CSF were obtained, secondary to the CSF flow becoming sanguinous. Electronically Signed   By: Abigail Miyamoto M.D.   On: 02/08/2021 13:58     ELIGIBLE FOR AVAILABLE RESEARCH PROTOCOL: no  ASSESSMENT: 76 y.o. [place] woman  1.  Status post left breast upper outer quadrant biopsy and left axillary needle core biopsy on 11/23/2008 for clinical T3 N1, stage  IIA invasive lobular carcinoma, E-cadherin negative, estrogen and progesterone receptor positive, HER-2 not amplified, with an MIB-1 of 17% Ki-67 17%   2.    On neoadjuvant letrozole started in 12/2008, interrupted at the time of her definitive surgery, then resumed post-op   4.  Status post left breast lumpectomy with regional lymph node resection 07/12/2009 for a stage IIA, pT1c pN1, invasive lobular carcinoma,with angiolymphatic invasion identified  (a) 2 of 8 removed left nodes were involved   (b) repeat prognostic panel again estrogen receptor  positive, not progesterone receptor negative, with an MIB-1 of 74% and HER-2 not amplified   (c) additional surgery for margin clearance 08/04/2009 showed only atypical lobular hyperplasia   5. Oncotype score of 20 predicts a 5-year rate of recurrence outside the breast with antiestrogens as the only systemic therapy of 13%.  It also predicts no significant benefit from chemotherapy.    6.  status post radiation therapy 09/26/2009-11/11/18  7.  letrozole continued through December 2015 (5 years)  METASTATIC DISEASE: September 2021 8.  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  zoledronate started 11/10/2019, repeat every 12 weeks  10.  anastrozole started 11/05/2019  (a) palbociclib 125 mg daily, 21/7, started 12/02/2019  11. Restaging studies:  (A) chest CT with contrast 06/04/2020 shows no visceral disease, unchanged bony lesions  (B) bone scan 06/04/2020 shows no change compared to 03/09/2020  (C) bone scan 01/03/2021  (D) chest CT 01/03/2021   12.  She was admitted to the hospital with chief complaint of urinary retention and severe back pain from 02/07/2021 to 02/19/2021, case discussed in neuro-oncology conference and the recommendation was to complete craniospinal irradiation.  She started this on 02/16/2020.  She also had systemic imaging which suggested severe diffuse lytic and sclerotic metastatic bone disease, several areas of masslike enhancement in the muscles of spine and pelvis worrisome for tumor involvement, bilateral pleural effusions, left larger than right.  No findings  suspicious for pulmonary metastatic disease or abdominal pelvic metastatic disease.  She completed palliative doses of craniospinal irradiation and is here for follow-up.  PLAN: We have discussed about the poor prognosis with leptomeningeal metastatic disease.  Patient appears completely shocked and surprised and tells me that she was told that she could have a good response from radiation.  I have discussed that the life expectancy can vary from a few weeks to few months and is most likely under 6 months for most patients with leptomeningeal disease.  We have discussed about trying capecitabine as a next option for chemotherapy if she is willing to try more treatment although I am concerned that she might be a bit too frail to tolerate any intensive chemotherapy.  She is not quite sure about chemotherapy.  I tried to explain to her that radiation alone will not stall the tumor for too long since there has been tumor growing outside craniospinal system.  If she decides not proceed with chemotherapy or any other form of systemic treatment, I brought up the topic of hospice and she panicked and she said she is not ready to do any hospice.  She at the  same time is not ready to proceed with systemic chemotherapy.  We will schedule a telephone visit in about a week to 10 days to review her treatment options.  I have discussed with [facility] radiation oncology team to call the patient and to discuss any other treatment options or prognosis in general since patient appears to be quite surprised by our discussion today.  Radiation oncology team to assist with dexamethasone dosing as well as taper and with Foley catheter removal.  Phone visit in 7 days to 10 days to review options. Total encounter time: 40 minutes.   *Total Encounter Time as defined by the Centers for Medicare and Medicaid Services includes, in addition to the face-to-face time of a patient visit (documented in the note above) non-face-to-face  time: obtaining and reviewing outside history, ordering and reviewing medications, tests or procedures, care coordination (communications with other health care professionals or caregivers) and documentation in the medical record.

## 2021-03-01 ENCOUNTER — Telehealth: Payer: Self-pay | Admitting: Radiation Oncology

## 2021-03-01 ENCOUNTER — Ambulatory Visit: Payer: Medicare Other

## 2021-03-01 DIAGNOSIS — L89152 Pressure ulcer of sacral region, stage 2: Secondary | ICD-10-CM | POA: Diagnosis not present

## 2021-03-01 DIAGNOSIS — M4804 Spinal stenosis, thoracic region: Secondary | ICD-10-CM | POA: Diagnosis not present

## 2021-03-01 DIAGNOSIS — M5136 Other intervertebral disc degeneration, lumbar region: Secondary | ICD-10-CM | POA: Diagnosis not present

## 2021-03-01 DIAGNOSIS — G834 Cauda equina syndrome: Secondary | ICD-10-CM | POA: Diagnosis not present

## 2021-03-01 DIAGNOSIS — C7981 Secondary malignant neoplasm of breast: Secondary | ICD-10-CM | POA: Diagnosis not present

## 2021-03-01 DIAGNOSIS — C50412 Malignant neoplasm of upper-outer quadrant of left female breast: Secondary | ICD-10-CM | POA: Diagnosis not present

## 2021-03-01 NOTE — Telephone Encounter (Signed)
I spoke with the patient and her son Felisha Claytor, her son who lives in [place]. They met with Dr. Chryl Heck in person yesterday. She continues to take Dexamethasone 4 mg BID since her discharge on 02/19/21. She also continues to have an indwelling foley catheter. Today we reviewed her prior imaging that diagnosed her leptomeningeal disease. We discussed her CSF analysis and the prognosis for leptomeningeal disease (LMD). While breast histology responds better to radiation and may give longer prognosis than most other tumor types causing LMD, we did discuss that the course she received was palliative in nature. She is currently trying to determine with Dr. Chryl Heck if her performance status is sufficient to proceed with any systemic therapy. She is too debilitated and has poor reserves for IV chemotherapy, but Dr. Chryl Heck is considering oral Xeloda. It has been very difficult for her to see her current situation as different than how she's been able to live and functional at a high capacity despite having metastatic disease seen since September 2021. We discussed the differences of CNS disease and specifically how aggressive her condition is. We also discussed supportive teams who can administer care at home such as palliative care and or hospice. We reviewed goals of hospice and clarified common myths about the goals of how hospice care. She was offered a referral and would be closest to the hospice and palliative care team through [facility]. I offered her a referral to consider all their resources and here about them from the agency. She will consider this and let us know. Otherwise she is in agreement that quality of life is still very important to her, and while she wants to get better, she does realize that there is not a cure for her cancer, it's just been very tough to see herself at this point and reconcile with the trajectory of her illness being much more aggressive than the last time metastatic disease was  found. She desires an attempt at a voiding trial, considering steroid taper with hopes of contined improvement in her walking. She is currently walking at home with a walker but has been more optimistic about this improving. We will plan to repeat her blood work next Wednesday at 2pm, see her at 2:30 pm in our department for a voiding trial (pt's preference we do this for her). She will also consider meeting with palliative/hospice team and at the conclusion of our discussion she and her son were both in agreement with this plan.     Carola Rhine, PAC

## 2021-03-02 ENCOUNTER — Emergency Department (HOSPITAL_COMMUNITY): Payer: Medicare Other

## 2021-03-02 ENCOUNTER — Other Ambulatory Visit: Payer: Self-pay | Admitting: *Deleted

## 2021-03-02 ENCOUNTER — Encounter: Payer: Self-pay | Admitting: Oncology

## 2021-03-02 ENCOUNTER — Other Ambulatory Visit: Payer: Self-pay

## 2021-03-02 ENCOUNTER — Ambulatory Visit: Payer: Medicare Other

## 2021-03-02 ENCOUNTER — Telehealth: Payer: Self-pay | Admitting: *Deleted

## 2021-03-02 ENCOUNTER — Other Ambulatory Visit: Payer: Self-pay | Admitting: Radiation Oncology

## 2021-03-02 ENCOUNTER — Emergency Department (HOSPITAL_COMMUNITY)
Admission: EM | Admit: 2021-03-02 | Discharge: 2021-03-03 | Disposition: A | Discharge status: Home / Self Care | Payer: Medicare Other | Source: Home / Self Care | Attending: Emergency Medicine | Admitting: Emergency Medicine

## 2021-03-02 ENCOUNTER — Encounter (HOSPITAL_COMMUNITY): Payer: Self-pay

## 2021-03-02 DIAGNOSIS — J90 Pleural effusion, not elsewhere classified: Secondary | ICD-10-CM | POA: Diagnosis present

## 2021-03-02 DIAGNOSIS — L89152 Pressure ulcer of sacral region, stage 2: Secondary | ICD-10-CM | POA: Diagnosis not present

## 2021-03-02 DIAGNOSIS — Z20822 Contact with and (suspected) exposure to covid-19: Secondary | ICD-10-CM | POA: Diagnosis present

## 2021-03-02 DIAGNOSIS — Z79899 Other long term (current) drug therapy: Secondary | ICD-10-CM | POA: Insufficient documentation

## 2021-03-02 DIAGNOSIS — R8281 Pyuria: Secondary | ICD-10-CM | POA: Diagnosis present

## 2021-03-02 DIAGNOSIS — R531 Weakness: Secondary | ICD-10-CM | POA: Diagnosis not present

## 2021-03-02 DIAGNOSIS — K429 Umbilical hernia without obstruction or gangrene: Secondary | ICD-10-CM | POA: Insufficient documentation

## 2021-03-02 DIAGNOSIS — M4804 Spinal stenosis, thoracic region: Secondary | ICD-10-CM | POA: Diagnosis not present

## 2021-03-02 DIAGNOSIS — R1030 Lower abdominal pain, unspecified: Secondary | ICD-10-CM | POA: Diagnosis not present

## 2021-03-02 DIAGNOSIS — Z170 Estrogen receptor positive status [ER+]: Secondary | ICD-10-CM | POA: Diagnosis not present

## 2021-03-02 DIAGNOSIS — R110 Nausea: Secondary | ICD-10-CM | POA: Insufficient documentation

## 2021-03-02 DIAGNOSIS — E861 Hypovolemia: Secondary | ICD-10-CM | POA: Diagnosis present

## 2021-03-02 DIAGNOSIS — C800 Disseminated malignant neoplasm, unspecified: Secondary | ICD-10-CM | POA: Insufficient documentation

## 2021-03-02 DIAGNOSIS — M5136 Other intervertebral disc degeneration, lumbar region: Secondary | ICD-10-CM | POA: Diagnosis not present

## 2021-03-02 DIAGNOSIS — G834 Cauda equina syndrome: Secondary | ICD-10-CM | POA: Diagnosis not present

## 2021-03-02 DIAGNOSIS — C7981 Secondary malignant neoplasm of breast: Secondary | ICD-10-CM | POA: Diagnosis not present

## 2021-03-02 DIAGNOSIS — C799 Secondary malignant neoplasm of unspecified site: Secondary | ICD-10-CM

## 2021-03-02 DIAGNOSIS — E785 Hyperlipidemia, unspecified: Secondary | ICD-10-CM | POA: Diagnosis present

## 2021-03-02 DIAGNOSIS — I700 Atherosclerosis of aorta: Secondary | ICD-10-CM | POA: Diagnosis not present

## 2021-03-02 DIAGNOSIS — R109 Unspecified abdominal pain: Secondary | ICD-10-CM | POA: Diagnosis not present

## 2021-03-02 DIAGNOSIS — Z515 Encounter for palliative care: Secondary | ICD-10-CM | POA: Diagnosis not present

## 2021-03-02 DIAGNOSIS — C7949 Secondary malignant neoplasm of other parts of nervous system: Secondary | ICD-10-CM | POA: Diagnosis not present

## 2021-03-02 DIAGNOSIS — K5900 Constipation, unspecified: Secondary | ICD-10-CM

## 2021-03-02 DIAGNOSIS — C7951 Secondary malignant neoplasm of bone: Secondary | ICD-10-CM | POA: Diagnosis present

## 2021-03-02 DIAGNOSIS — I10 Essential (primary) hypertension: Secondary | ICD-10-CM | POA: Diagnosis present

## 2021-03-02 DIAGNOSIS — I739 Peripheral vascular disease, unspecified: Secondary | ICD-10-CM | POA: Diagnosis present

## 2021-03-02 DIAGNOSIS — Z8249 Family history of ischemic heart disease and other diseases of the circulatory system: Secondary | ICD-10-CM | POA: Diagnosis not present

## 2021-03-02 DIAGNOSIS — Z7189 Other specified counseling: Secondary | ICD-10-CM | POA: Diagnosis not present

## 2021-03-02 DIAGNOSIS — R339 Retention of urine, unspecified: Secondary | ICD-10-CM | POA: Diagnosis present

## 2021-03-02 DIAGNOSIS — R1084 Generalized abdominal pain: Secondary | ICD-10-CM | POA: Diagnosis not present

## 2021-03-02 DIAGNOSIS — Z853 Personal history of malignant neoplasm of breast: Secondary | ICD-10-CM | POA: Diagnosis not present

## 2021-03-02 DIAGNOSIS — N3000 Acute cystitis without hematuria: Secondary | ICD-10-CM | POA: Diagnosis not present

## 2021-03-02 DIAGNOSIS — R627 Adult failure to thrive: Secondary | ICD-10-CM | POA: Diagnosis not present

## 2021-03-02 DIAGNOSIS — E871 Hypo-osmolality and hyponatremia: Secondary | ICD-10-CM | POA: Diagnosis not present

## 2021-03-02 DIAGNOSIS — F17210 Nicotine dependence, cigarettes, uncomplicated: Secondary | ICD-10-CM | POA: Diagnosis present

## 2021-03-02 DIAGNOSIS — Z66 Do not resuscitate: Secondary | ICD-10-CM | POA: Diagnosis present

## 2021-03-02 DIAGNOSIS — C50412 Malignant neoplasm of upper-outer quadrant of left female breast: Secondary | ICD-10-CM | POA: Diagnosis not present

## 2021-03-02 DIAGNOSIS — Z888 Allergy status to other drugs, medicaments and biological substances status: Secondary | ICD-10-CM | POA: Diagnosis not present

## 2021-03-02 LAB — COMPREHENSIVE METABOLIC PANEL
ALT: 73 U/L — ABNORMAL HIGH (ref 0–44)
AST: 39 U/L (ref 15–41)
Albumin: 3.2 g/dL — ABNORMAL LOW (ref 3.5–5.0)
Alkaline Phosphatase: 86 U/L (ref 38–126)
Anion gap: 10 (ref 5–15)
BUN: 25 mg/dL — ABNORMAL HIGH (ref 8–23)
CO2: 26 mmol/L (ref 22–32)
Calcium: 9 mg/dL (ref 8.9–10.3)
Chloride: 96 mmol/L — ABNORMAL LOW (ref 98–111)
Creatinine, Ser: 0.58 mg/dL (ref 0.44–1.00)
GFR, Estimated: >60 mL/min (ref >60)
Glucose, Bld: 208 mg/dL — ABNORMAL HIGH (ref 70–99)
Potassium: 4.2 mmol/L (ref 3.5–5.1)
Sodium: 132 mmol/L — ABNORMAL LOW (ref 135–145)
Total Bilirubin: 0.6 mg/dL (ref 0.3–1.2)
Total Protein: 6.2 g/dL — ABNORMAL LOW (ref 6.5–8.1)

## 2021-03-02 LAB — CBC
HCT: 41.3 % (ref 36.0–46.0)
Hemoglobin: 13.8 g/dL (ref 12.0–15.0)
MCH: 31.7 pg (ref 26.0–34.0)
MCHC: 33.4 g/dL (ref 30.0–36.0)
MCV: 94.9 fL (ref 80.0–100.0)
Platelets: 132 10*3/uL — ABNORMAL LOW (ref 150–400)
RBC: 4.35 MIL/uL (ref 3.87–5.11)
RDW: 17.2 % — ABNORMAL HIGH (ref 11.5–15.5)
WBC: 4.7 10*3/uL (ref 4.0–10.5)
nRBC: 0 % (ref 0.0–0.2)

## 2021-03-02 LAB — URINALYSIS, ROUTINE W REFLEX MICROSCOPIC
Bilirubin Urine: NEGATIVE
Glucose, UA: NEGATIVE mg/dL
Hgb urine dipstick: NEGATIVE
Ketones, ur: NEGATIVE mg/dL
Nitrite: NEGATIVE
Protein, ur: NEGATIVE mg/dL
RBC / HPF: >50 RBC/hpf — ABNORMAL HIGH (ref 0–5)
Specific Gravity, Urine: 1.025 (ref 1.005–1.030)
WBC, UA: >50 WBC/hpf — ABNORMAL HIGH (ref 0–5)
pH: 6 (ref 5.0–8.0)

## 2021-03-02 LAB — LIPASE, BLOOD: Lipase: 28 U/L (ref 11–51)

## 2021-03-02 MED ORDER — HYDROMORPHONE HCL 1 MG/ML IJ SOLN
1.0000 mg | Freq: Once | INTRAMUSCULAR | Status: AC
  Administered 2021-03-02: 1 mg via INTRAVENOUS
  Filled 2021-03-02: qty 1

## 2021-03-02 MED ORDER — SODIUM CHLORIDE 0.9 % IV BOLUS
500.0000 mL | Freq: Once | INTRAVENOUS | Status: AC
  Administered 2021-03-02: 500 mL via INTRAVENOUS

## 2021-03-02 MED ORDER — FLEET ENEMA 7-19 GM/118ML RE ENEM
1.0000 | ENEMA | Freq: Once | RECTAL | Status: AC
  Administered 2021-03-03: 1 via RECTAL
  Filled 2021-03-02: qty 1

## 2021-03-02 MED ORDER — MORPHINE SULFATE (PF) 4 MG/ML IV SOLN: 4.0000 mg | Freq: Once | INTRAVENOUS | Status: DC

## 2021-03-02 MED ORDER — MAGNESIUM HYDROXIDE 400 MG/5ML PO SUSP
5.0000 mL | Freq: Every day | ORAL | Status: DC
  Filled 2021-03-02: qty 30

## 2021-03-02 MED ORDER — SODIUM CHLORIDE 0.9 % IV SOLN
1.0000 g | Freq: Once | INTRAVENOUS | Status: AC
  Administered 2021-03-02: 1 g via INTRAVENOUS
  Filled 2021-03-02: qty 10

## 2021-03-02 MED ORDER — POLYETHYLENE GLYCOL 3350 17 G PO PACK: 17.0000 g | PACK | Freq: Every day | ORAL | Status: DC

## 2021-03-02 MED ORDER — ONDANSETRON HCL 4 MG/2ML IJ SOLN
4.0000 mg | Freq: Once | INTRAMUSCULAR | Status: AC
  Administered 2021-03-02: 4 mg via INTRAVENOUS
  Filled 2021-03-02: qty 2

## 2021-03-02 MED ORDER — IOHEXOL 300 MG/ML  SOLN
100.0000 mL | Freq: Once | INTRAMUSCULAR | Status: AC | PRN
  Administered 2021-03-02: 100 mL via INTRAVENOUS

## 2021-03-02 NOTE — ED Provider Notes (Signed)
[facility] DEPT Provider Note   CSN: [id number] Arrival date & time: 03/02/21  1945     History  Chief Complaint  Patient presents with   Abdominal Pain    Leslie [name] is a 76 y.o. female.  HPI Patient is a 76 year old female with a history of leptomeningeal metastatic disease who presents to the emergency department due to lower abdominal pain that started earlier today.  She states that she was taking nap at the time.  She endorses nausea but no vomiting or diarrhea.  No chest pain or shortness of breath.  Patient has indwelling Foley catheter and denies any urinary complaints.    Home Medications Prior to Admission medications   Medication Sig Start Date End Date Taking? Authorizing Provider  anastrozole (ARIMIDEX) 1 MG tablet Take 1 tablet (1 mg total) by mouth daily. 12/12/20   Magrinat, Virgie Dad, MD  atenolol-chlorthalidone (TENORETIC) 50-25 MG per tablet Take 0.5 tablets by mouth daily as needed (for elevated B/P).    [provider]  Cholecalciferol (VITAMIN D3) 10 MCG (400 UNIT) CAPS Take 400 Units by mouth daily with breakfast.    [provider]  dexamethasone (DECADRON) 4 MG tablet Take 1 tablet (4 mg total) by mouth every 12 (twelve) hours. 02/28/21   Benay Pike, MD  Evening Primrose Oil 1000 MG CAPS Take 1,000 mg by mouth daily.    [provider]  feeding supplement (ENSURE ENLIVE / ENSURE PLUS) LIQD Take 237 mLs by mouth 2 (two) times daily between meals. 02/19/21   Annita Brod, MD  fluocinonide cream (LIDEX) 2.37 % Apply 1 application topically 2 (two) times daily as needed (for irritation- affected areas).    [provider]  folic acid (FOLVITE) 628 MCG tablet Take 400 mcg by mouth daily.     [provider]  lidocaine (LIDODERM) 5 % Place 1 patch onto the skin See admin instructions. Apply 1 patch to the lower back once a day and remove & discard the patch within 12 hours or as  directed by MD 11/09/20   Magrinat, Virgie Dad, MD  mirtazapine (REMERON) 7.5 MG tablet Take 7.5 mg by mouth at bedtime.    [provider]  montelukast (SINGULAIR) 10 MG tablet Take 10 mg by mouth at bedtime.    [provider]  Multiple Vitamin (MULTIVITAMIN PO) Take 1 tablet by mouth daily with breakfast.    [provider]  nitrofurantoin, macrocrystal-monohydrate, (MACROBID) 100 MG capsule Take 1 capsule (100 mg total) by mouth at bedtime. 02/19/21   Annita Brod, MD  ondansetron (ZOFRAN) 8 MG tablet Take 8 mg by mouth every 8 (eight) hours as needed for nausea or vomiting. 11/09/20   Magrinat, Virgie Dad, MD  oxyCODONE-acetaminophen (PERCOCET/ROXICET) 5-325 MG tablet Take 1 tablet by mouth every 6 (six) hours as needed for severe pain. 02/19/21   Annita Brod, MD  triamcinolone cream (KENALOG) 0.1 % Apply 1 application topically daily as needed (for itching- affectes sites). 05/07/19   [provider]  TYLENOL 8 HOUR 650 MG CR tablet Take 650-1,300 mg by mouth every 8 (eight) hours as needed for pain.    [provider]  vitamin E 400 UNIT capsule Take 400 Units by mouth daily.    [provider]      Allergies    Aspirin, Cetirizine, and Rosuvastatin    Review of Systems   Review of Systems  All other systems reviewed and are negative.  Ten systems reviewed and are negative for acute change, except as noted in the HPI.   Physical Exam Updated Vital Signs BP 128/71    Pulse 73    Temp 98.1 F (36.7 C) (Oral)    Resp 16    Ht 5\' 4"  (1.626 m)    Wt 44 kg    SpO2 99%    BMI 16.65 kg/m  Physical Exam Vitals and nursing note reviewed.  Constitutional:      General: She is not in acute distress.    Appearance: Normal appearance. She is well-developed and normal weight. She is not ill-appearing, toxic-appearing or diaphoretic.  HENT:     Head: Normocephalic and atraumatic.     Right Ear: External ear normal.     Left Ear:  External ear normal.     Nose: Nose normal.     Mouth/Throat:     Mouth: Mucous membranes are moist.     Pharynx: Oropharynx is clear. No oropharyngeal exudate or posterior oropharyngeal erythema.  Eyes:     Extraocular Movements: Extraocular movements intact.  Cardiovascular:     Rate and Rhythm: Normal rate and regular rhythm.     Pulses: Normal pulses.     Heart sounds: Normal heart sounds. No murmur heard.   No friction rub. No gallop.  Pulmonary:     Effort: Pulmonary effort is normal. No respiratory distress.     Breath sounds: Normal breath sounds. No stridor. No wheezing, rhonchi or rales.  Abdominal:     General: Abdomen is flat.     Palpations: Abdomen is soft.     Tenderness: There is abdominal tenderness.     Hernia: A hernia is present. Hernia is present in the umbilical area.     Comments: Abdomen is flat and soft.  Mild tenderness noted diffusely in the lower abdomen.  Musculoskeletal:        General: Normal range of motion.     Cervical back: Normal range of motion and neck supple. No tenderness.  Skin:    General: Skin is warm and dry.  Neurological:     General: No focal deficit present.     Mental Status: She is alert and oriented to person, place, and time.  Psychiatric:        Mood and Affect: Mood normal.        Behavior: Behavior normal.   ED Results / Procedures / Treatments   Labs (all labs ordered are listed, but only abnormal results are displayed) Labs Reviewed  COMPREHENSIVE METABOLIC PANEL - Abnormal; Notable for the following components:      Result Value   Sodium 132 (*)    Chloride 96 (*)    Glucose, Bld 208 (*)    BUN 25 (*)    Total Protein 6.2 (*)    Albumin 3.2 (*)    ALT 73 (*)    All other components within normal limits  CBC - Abnormal; Notable for the following components:   RDW 17.2 (*)    Platelets 132 (*)    All other components within normal limits  URINALYSIS, ROUTINE W REFLEX MICROSCOPIC - Abnormal; Notable for the  following components:   Color, Urine AMBER (*)    APPearance CLOUDY (*)    Leukocytes,Ua LARGE (*)    RBC / HPF >50 (*)    WBC, UA >50 (*)    Bacteria, UA RARE (*)    All other components within normal limits  LIPASE, BLOOD   EKG  None  Radiology CT ABDOMEN PELVIS W CONTRAST  Result Date: 03/02/2021 CLINICAL DATA:  Abdomen pain EXAM: CT ABDOMEN AND PELVIS WITH CONTRAST TECHNIQUE: Multidetector CT imaging of the abdomen and pelvis was performed using the standard protocol following bolus administration of intravenous contrast. RADIATION DOSE REDUCTION: This exam was performed according to the departmental dose-optimization program which includes automated exposure control, adjustment of the mA and/or kV according to patient size and/or use of iterative reconstruction technique. CONTRAST:  168mL OMNIPAQUE IOHEXOL 300 MG/ML  SOLN COMPARISON:  CT 08/14/2018, CT 02/08/2021 FINDINGS: Lower chest: Small right and small moderate left pleural effusions. Normal cardiac size. Partial consolidations in the lower lobes likely due to atelectasis. Hepatobiliary: No focal liver abnormality is seen. No gallstones, gallbladder wall thickening, or biliary dilatation. Pancreas: Unremarkable. No pancreatic ductal dilatation or surrounding inflammatory changes. Spleen: Normal in size without focal abnormality. Adrenals/Urinary Tract: Adrenal glands are within normal limits. No hydronephrosis. Cyst midpole left kidney. Foley catheter in the bladder. Stomach/Bowel: The stomach is nonenlarged. No dilated small bowel. No acute bowel wall thickening. Large stool burden. Vascular/Lymphatic: Advanced aortic atherosclerosis. No aneurysm. No increasing adenopathy. Reproductive: Status post hysterectomy. No adnexal masses. Other: Negative for pelvic effusion or free air. Musculoskeletal: Heterogenous masslike areas of muscle enhancement involving the gluteal regions, right anterior hip muscles, right iliacus muscle, and the  paraspinal musculature. Extensive lytic and sclerotic lesions consistent with widespread skeletal metastatic disease. IMPRESSION: 1. Negative for bowel obstruction or bowel wall thickening. Large stool burden suggesting constipation. 2. Small moderate left and small right-sided pleural effusion with probable passive atelectasis at the left greater than right lower lobes 3. 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 Extensive heterogeneous masslike enhancement involving the gluteal muscles, paraspinal muscles, and right hip muscles concerning for soft tissue metastatic disease. Electronically Signed   By: Donavan Foil M.D.   On: 03/02/2021 22:29    Procedures Procedures   Medications Ordered in ED Medications  magnesium hydroxide (MILK OF MAGNESIA) suspension 5 mL (has no administration in time range)  iohexol (OMNIPAQUE) 300 MG/ML solution 100 mL (100 mLs Intravenous Contrast Given 03/02/21 2143)  sodium chloride 0.9 % bolus 500 mL (0 mLs Intravenous Stopped 03/03/21 0030)  ondansetron (ZOFRAN) injection 4 mg (4 mg Intravenous Given 03/02/21 2310)  cefTRIAXone (ROCEPHIN) 1 g in sodium chloride 0.9 % 100 mL IVPB (0 g Intravenous Stopped 03/03/21 0030)  HYDROmorphone (DILAUDID) injection 1 mg (1 mg Intravenous Given 03/02/21 2335)  sodium phosphate (FLEET) 7-19 GM/118ML enema 1 enema (1 enema Rectal Given 03/03/21 0001)    ED Course/ Medical Decision Making/ A&P                           Medical Decision Making Amount and/or Complexity of Data Reviewed Labs: ordered.  Risk OTC drugs. Prescription drug management.   Pt is a 76 y.o. female with history of leptomeningeal metastatic disease who presents to the emergency department due to lower abdominal pain as well as nausea.  Labs: CBC with an RDW of 17.2 and platelets of 132. CMP with a sodium of 132, chloride 96, glucose of 2 8, BUN 25, total protein 6.2, albumin of 3.2, ALT of 73. Lipase of 28. UA with large leukocytes, greater  than 50 red blood cells, greater than 50 white blood cells, rare bacteria.  Imaging: CT scan of abdomen/pelvis showing MPRESSION: 1. Negative for bowel obstruction or bowel wall thickening. Large stool burden suggesting constipation. 2. Small moderate left  and small right-sided pleural effusion with probable passive atelectasis at the left greater than right lower lobes 3. 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 Extensive heterogeneous masslike enhancement involving the gluteal muscles, paraspinal muscles, and right hip muscles concerning for soft tissue metastatic disease. Electronically Signed   By: Donavan Foil M.D.   On: 03/02/2021 22:29    I, Rayna Sexton, PA-C, personally reviewed and evaluated these images and lab results as part of my medical decision-making.  On my exam abdomen is soft.  Patient has mild tenderness diffusely across the lower abdomen.  I obtained a CT scan with findings as noted above.  Negative for bowel obstruction or wall thickening.  She does have a large stool burden suggesting constipation.  Patient has a history of metastatic cancer and is currently taking multiple narcotic pain medications which is likely the source of her constipation.  She was given milk of magnesia as well as a fleets enema.  Produced a small amount of stool.  Patient's pain was treated with Dilaudid.  She notes significant improvement and is lying comfortably in bed.  We discussed her additional CT scan findings concerning for metastatic disease.  It appears that she was evaluated by oncology 2 days ago and was found to have a poor prognosis.  They discussed hospice at that time but patient was not amenable.  We discussed this further.  She appears more amenable to the idea of hospice at this time.  She states she is going to follow back up with her oncologist on Monday to discuss this further.  She states that she still has an adequate amount of pain medications at home.  Recommended that  she start taking MiraLAX for constipation.  Lab work with abnormalities as noted above.  CBC without leukocytosis.  CMP shows a sodium of 132 and chloride of 96.  She was given 500 cc of normal saline for this.  Lipase within normal limits at 28.  UA shows large leukocytes, greater than 50 red blood cells, as well as greater than 50 white blood cells.  She does have an indwelling Foley catheter.  Patient was given 1 g of Rocephin.  Feel that the patient is stable for discharge at this time and she is agreeable.  Discussed return precautions.  Her questions were answered and she was amicable at the time of discharge.  Vital signs stable.  Note: Portions of this report may have been transcribed using voice recognition software. Every effort was made to ensure accuracy; however, inadvertent computerized transcription errors may be present.    Final Clinical Impression(s) / ED Diagnoses Final diagnoses:  Constipation, unspecified constipation type  Lower abdominal pain  Metastatic malignant neoplasm, unspecified site [facility])   Rx / [place] Orders ED Discharge Orders     None         Rayna Sexton, PA-C 03/03/21 0154    Maudie Flakes, MD 03/03/21 [id number]

## 2021-03-02 NOTE — ED Triage Notes (Signed)
Patient arrives from home with c/o lower abdominal pain that began tonight. Pt endorses nausea but denies any vomiting or diarrhea.

## 2021-03-02 NOTE — Telephone Encounter (Signed)
Spoke with the patients son who voiced concerns that his mom needs more help at home than he can provide.  They had a discussion with the PA on yesterday (see note) and he states he is ready to discuss palliative care options.  He reports he will talk with his mom to get her on board with the discussion.  I let him know we will place the referral to [facility].  He asked that they reach out to him versus his mom.  PA updated on discussion.  Gloriajean Dell. Leonie Green, BSN

## 2021-03-02 NOTE — ED Provider Triage Note (Signed)
Emergency Medicine Provider Triage Evaluation Note  Leslie [name] , a 76 y.o. female  was evaluated in triage.  Pt complains of lower abdominal pain.  That started around 4pm.  Pain is constant. She has a catheter in place.  She reports normal output.    Last bowel movement was two days ago and was nromal for her. She states that pain is on both sides.    She is currently getting radiation, not chemo.  Physical Exam  BP (!) 148/78 (BP Location: Right Arm)    Pulse 74    Temp 98.1 F (36.7 C) (Oral)    Resp 16    Ht 5\' 4"  (1.626 m)    Wt 44 kg    SpO2 94%    BMI 16.65 kg/m  Gen:   Awake, no distress   Resp:  Normal effort  MSK:   Moves extremities without difficulty  Other:  Lower abdomen TTP.   Medical Decision Making  Medically screening exam initiated at 8:12 PM.  Appropriate orders placed.  Leslie [name] was informed that the remainder of the evaluation will be completed by another provider, this initial triage assessment does not replace that evaluation, and the importance of remaining in the ED until their evaluation is complete.     Lorin Glass, [place] 03/02/21 2020

## 2021-03-02 NOTE — ED Notes (Signed)
Patient transported to CT 

## 2021-03-03 NOTE — ED Notes (Signed)
Pt has had runny brown liquid post enema but no passing of actual stool. EDP informed.

## 2021-03-03 NOTE — Discharge Instructions (Signed)
Please continue to take your prescribed pain medications as needed for your symptoms.  I would recommend they begin taking MiraLAX for your constipation.  This can be purchased over-the-counter.  Please follow the instructions on the box.  Please follow-up with your oncologist on Monday.  If you develop any new or worsening symptoms please come back to the emergency department.

## 2021-03-04 ENCOUNTER — Emergency Department (HOSPITAL_COMMUNITY): Payer: Medicare Other

## 2021-03-04 ENCOUNTER — Encounter (HOSPITAL_COMMUNITY): Payer: Self-pay | Admitting: Emergency Medicine

## 2021-03-04 ENCOUNTER — Inpatient Hospital Stay (HOSPITAL_COMMUNITY)
Admission: EM | Admit: 2021-03-04 | Discharge: 2021-03-10 | DRG: 641 | Disposition: A | Discharge status: Home Health | Payer: Medicare Other | Attending: Internal Medicine | Admitting: Internal Medicine

## 2021-03-04 DIAGNOSIS — L8990 Pressure ulcer of unspecified site, unspecified stage: Secondary | ICD-10-CM | POA: Insufficient documentation

## 2021-03-04 DIAGNOSIS — N3000 Acute cystitis without hematuria: Secondary | ICD-10-CM | POA: Diagnosis not present

## 2021-03-04 DIAGNOSIS — R339 Retention of urine, unspecified: Secondary | ICD-10-CM | POA: Diagnosis present

## 2021-03-04 DIAGNOSIS — C50412 Malignant neoplasm of upper-outer quadrant of left female breast: Secondary | ICD-10-CM

## 2021-03-04 DIAGNOSIS — Z20822 Contact with and (suspected) exposure to covid-19: Secondary | ICD-10-CM | POA: Diagnosis present

## 2021-03-04 DIAGNOSIS — E861 Hypovolemia: Secondary | ICD-10-CM | POA: Diagnosis present

## 2021-03-04 DIAGNOSIS — Z170 Estrogen receptor positive status [ER+]: Secondary | ICD-10-CM | POA: Diagnosis not present

## 2021-03-04 DIAGNOSIS — R109 Unspecified abdominal pain: Secondary | ICD-10-CM | POA: Diagnosis not present

## 2021-03-04 DIAGNOSIS — Z79899 Other long term (current) drug therapy: Secondary | ICD-10-CM

## 2021-03-04 DIAGNOSIS — R627 Adult failure to thrive: Secondary | ICD-10-CM | POA: Diagnosis not present

## 2021-03-04 DIAGNOSIS — Z8249 Family history of ischemic heart disease and other diseases of the circulatory system: Secondary | ICD-10-CM

## 2021-03-04 DIAGNOSIS — C7951 Secondary malignant neoplasm of bone: Secondary | ICD-10-CM | POA: Diagnosis present

## 2021-03-04 DIAGNOSIS — J90 Pleural effusion, not elsewhere classified: Secondary | ICD-10-CM | POA: Diagnosis present

## 2021-03-04 DIAGNOSIS — Z978 Presence of other specified devices: Secondary | ICD-10-CM

## 2021-03-04 DIAGNOSIS — I739 Peripheral vascular disease, unspecified: Secondary | ICD-10-CM | POA: Diagnosis present

## 2021-03-04 DIAGNOSIS — Z853 Personal history of malignant neoplasm of breast: Secondary | ICD-10-CM

## 2021-03-04 DIAGNOSIS — I10 Essential (primary) hypertension: Secondary | ICD-10-CM | POA: Diagnosis present

## 2021-03-04 DIAGNOSIS — R8281 Pyuria: Secondary | ICD-10-CM

## 2021-03-04 DIAGNOSIS — E871 Hypo-osmolality and hyponatremia: Principal | ICD-10-CM | POA: Diagnosis present

## 2021-03-04 DIAGNOSIS — Z888 Allergy status to other drugs, medicaments and biological substances status: Secondary | ICD-10-CM

## 2021-03-04 DIAGNOSIS — Z515 Encounter for palliative care: Secondary | ICD-10-CM

## 2021-03-04 DIAGNOSIS — R531 Weakness: Secondary | ICD-10-CM | POA: Diagnosis not present

## 2021-03-04 DIAGNOSIS — Z66 Do not resuscitate: Secondary | ICD-10-CM | POA: Diagnosis present

## 2021-03-04 DIAGNOSIS — E785 Hyperlipidemia, unspecified: Secondary | ICD-10-CM | POA: Diagnosis present

## 2021-03-04 DIAGNOSIS — G834 Cauda equina syndrome: Secondary | ICD-10-CM | POA: Diagnosis present

## 2021-03-04 DIAGNOSIS — R1084 Generalized abdominal pain: Secondary | ICD-10-CM | POA: Diagnosis not present

## 2021-03-04 DIAGNOSIS — R1030 Lower abdominal pain, unspecified: Secondary | ICD-10-CM

## 2021-03-04 DIAGNOSIS — F17210 Nicotine dependence, cigarettes, uncomplicated: Secondary | ICD-10-CM | POA: Diagnosis present

## 2021-03-04 DIAGNOSIS — C7949 Secondary malignant neoplasm of other parts of nervous system: Secondary | ICD-10-CM | POA: Diagnosis present

## 2021-03-04 LAB — COMPREHENSIVE METABOLIC PANEL
ALT: 67 U/L — ABNORMAL HIGH (ref 0–44)
AST: 40 U/L (ref 15–41)
Albumin: 3 g/dL — ABNORMAL LOW (ref 3.5–5.0)
Alkaline Phosphatase: 70 U/L (ref 38–126)
Anion gap: 10 (ref 5–15)
BUN: 20 mg/dL (ref 8–23)
CO2: 23 mmol/L (ref 22–32)
Calcium: 8.3 mg/dL — ABNORMAL LOW (ref 8.9–10.3)
Chloride: 92 mmol/L — ABNORMAL LOW (ref 98–111)
Creatinine, Ser: 0.48 mg/dL (ref 0.44–1.00)
GFR, Estimated: >60 mL/min (ref >60)
Glucose, Bld: 137 mg/dL — ABNORMAL HIGH (ref 70–99)
Potassium: 4.1 mmol/L (ref 3.5–5.1)
Sodium: 125 mmol/L — ABNORMAL LOW (ref 135–145)
Total Bilirubin: 0.6 mg/dL (ref 0.3–1.2)
Total Protein: 5.9 g/dL — ABNORMAL LOW (ref 6.5–8.1)

## 2021-03-04 LAB — CBC
HCT: 39.1 % (ref 36.0–46.0)
Hemoglobin: 13.3 g/dL (ref 12.0–15.0)
MCH: 31.6 pg (ref 26.0–34.0)
MCHC: 34 g/dL (ref 30.0–36.0)
MCV: 92.9 fL (ref 80.0–100.0)
Platelets: 124 10*3/uL — ABNORMAL LOW (ref 150–400)
RBC: 4.21 MIL/uL (ref 3.87–5.11)
RDW: 17.1 % — ABNORMAL HIGH (ref 11.5–15.5)
WBC: 5.2 10*3/uL (ref 4.0–10.5)
nRBC: 0 % (ref 0.0–0.2)

## 2021-03-04 LAB — URINALYSIS, ROUTINE W REFLEX MICROSCOPIC
Bilirubin Urine: NEGATIVE
Glucose, UA: NEGATIVE mg/dL
Hgb urine dipstick: NEGATIVE
Ketones, ur: 5 mg/dL — AB
Nitrite: NEGATIVE
Protein, ur: NEGATIVE mg/dL
Specific Gravity, Urine: 1.03 (ref 1.005–1.030)
pH: 6 (ref 5.0–8.0)

## 2021-03-04 LAB — BASIC METABOLIC PANEL
Anion gap: 8 (ref 5–15)
BUN: 18 mg/dL (ref 8–23)
CO2: 23 mmol/L (ref 22–32)
Calcium: 8.1 mg/dL — ABNORMAL LOW (ref 8.9–10.3)
Chloride: 96 mmol/L — ABNORMAL LOW (ref 98–111)
Creatinine, Ser: <0.3 mg/dL — ABNORMAL LOW (ref 0.44–1.00)
Glucose, Bld: 111 mg/dL — ABNORMAL HIGH (ref 70–99)
Potassium: 4 mmol/L (ref 3.5–5.1)
Sodium: 127 mmol/L — ABNORMAL LOW (ref 135–145)

## 2021-03-04 LAB — LIPASE, BLOOD: Lipase: 26 U/L (ref 11–51)

## 2021-03-04 MED ORDER — ONDANSETRON HCL 4 MG PO TABS: 4.0000 mg | ORAL_TABLET | Freq: Four times a day (QID) | ORAL | Status: DC | PRN

## 2021-03-04 MED ORDER — ANASTROZOLE 1 MG PO TABS
1.0000 mg | ORAL_TABLET | Freq: Every day | ORAL | Status: DC
  Administered 2021-03-05 – 2021-03-10 (×6): 1 mg via ORAL
  Filled 2021-03-04 (×6): qty 1

## 2021-03-04 MED ORDER — SORBITOL 70 % SOLN: 30.0000 mL | Freq: Every day | Status: DC | PRN

## 2021-03-04 MED ORDER — OXYCODONE-ACETAMINOPHEN 5-325 MG PO TABS
1.0000 | ORAL_TABLET | Freq: Four times a day (QID) | ORAL | Status: DC | PRN
  Administered 2021-03-07 – 2021-03-09 (×7): 1 via ORAL
  Filled 2021-03-04 (×7): qty 1

## 2021-03-04 MED ORDER — SODIUM CHLORIDE (PF) 0.9 % IJ SOLN
INTRAMUSCULAR | Status: AC
  Filled 2021-03-04: qty 50

## 2021-03-04 MED ORDER — FLUOCINONIDE 0.05 % EX CREA: 1.0000 "application " | TOPICAL_CREAM | Freq: Two times a day (BID) | CUTANEOUS | Status: DC | PRN

## 2021-03-04 MED ORDER — LIDOCAINE 5 % EX PTCH
1.0000 | MEDICATED_PATCH | Freq: Every day | CUTANEOUS | Status: DC
  Administered 2021-03-05 – 2021-03-10 (×6): 1 via TRANSDERMAL
  Filled 2021-03-04 (×6): qty 1

## 2021-03-04 MED ORDER — DEXAMETHASONE 4 MG PO TABS
4.0000 mg | ORAL_TABLET | Freq: Two times a day (BID) | ORAL | Status: DC
  Administered 2021-03-04 – 2021-03-06 (×4): 4 mg via ORAL
  Filled 2021-03-04 (×4): qty 1

## 2021-03-04 MED ORDER — IOHEXOL 9 MG/ML PO SOLN: 500.0000 mL | ORAL | Status: AC

## 2021-03-04 MED ORDER — MIRTAZAPINE 15 MG PO TABS
7.5000 mg | ORAL_TABLET | Freq: Every day | ORAL | Status: DC
  Administered 2021-03-04 – 2021-03-09 (×6): 7.5 mg via ORAL
  Filled 2021-03-04 (×6): qty 1

## 2021-03-04 MED ORDER — VITAMIN E 45 MG (100 UNIT) PO CAPS: 400.0000 [IU] | ORAL_CAPSULE | Freq: Every day | ORAL | Status: DC

## 2021-03-04 MED ORDER — DOCUSATE SODIUM 100 MG PO CAPS
100.0000 mg | ORAL_CAPSULE | Freq: Two times a day (BID) | ORAL | Status: DC
  Administered 2021-03-04 – 2021-03-10 (×12): 100 mg via ORAL
  Filled 2021-03-04 (×12): qty 1

## 2021-03-04 MED ORDER — MONTELUKAST SODIUM 10 MG PO TABS
10.0000 mg | ORAL_TABLET | Freq: Every day | ORAL | Status: DC
  Administered 2021-03-05 – 2021-03-09 (×5): 10 mg via ORAL
  Filled 2021-03-04 (×6): qty 1

## 2021-03-04 MED ORDER — ACETAMINOPHEN 650 MG RE SUPP: 650.0000 mg | Freq: Four times a day (QID) | RECTAL | Status: DC | PRN

## 2021-03-04 MED ORDER — SODIUM CHLORIDE 0.9 % IV BOLUS: 500.0000 mL | Freq: Once | INTRAVENOUS | Status: DC

## 2021-03-04 MED ORDER — ENOXAPARIN SODIUM 30 MG/0.3ML IJ SOSY
30.0000 mg | PREFILLED_SYRINGE | INTRAMUSCULAR | Status: DC
  Administered 2021-03-04 – 2021-03-09 (×6): 30 mg via SUBCUTANEOUS
  Filled 2021-03-04 (×6): qty 0.3

## 2021-03-04 MED ORDER — VITAMIN E 45 MG (100 UNIT) PO CAPS
400.0000 [IU] | ORAL_CAPSULE | Freq: Every day | ORAL | Status: DC
  Administered 2021-03-05 – 2021-03-10 (×6): 400 [IU] via ORAL
  Filled 2021-03-04 (×6): qty 4

## 2021-03-04 MED ORDER — SODIUM CHLORIDE 0.9% FLUSH
3.0000 mL | Freq: Two times a day (BID) | INTRAVENOUS | Status: DC
  Administered 2021-03-04 – 2021-03-10 (×8): 3 mL via INTRAVENOUS

## 2021-03-04 MED ORDER — ONDANSETRON HCL 4 MG/2ML IJ SOLN
4.0000 mg | Freq: Once | INTRAMUSCULAR | Status: AC
  Administered 2021-03-04: 4 mg via INTRAVENOUS
  Filled 2021-03-04: qty 2

## 2021-03-04 MED ORDER — IOHEXOL 12 MG/ML PO SOLN: 500.0000 mL | ORAL | Status: DC

## 2021-03-04 MED ORDER — FLEET ENEMA 7-19 GM/118ML RE ENEM: 1.0000 | ENEMA | Freq: Once | RECTAL | Status: DC | PRN

## 2021-03-04 MED ORDER — ONDANSETRON HCL 4 MG/2ML IJ SOLN
4.0000 mg | Freq: Four times a day (QID) | INTRAMUSCULAR | Status: DC | PRN
  Administered 2021-03-05 – 2021-03-06 (×2): 4 mg via INTRAVENOUS
  Filled 2021-03-04 (×2): qty 2

## 2021-03-04 MED ORDER — SODIUM CHLORIDE 0.9 % IV SOLN
INTRAVENOUS | Status: DC
  Administered 2021-03-04: 1000 mL via INTRAVENOUS

## 2021-03-04 MED ORDER — IOHEXOL 9 MG/ML PO SOLN
ORAL | Status: AC
  Filled 2021-03-04: qty 500

## 2021-03-04 MED ORDER — MAGNESIUM HYDROXIDE 400 MG/5ML PO SUSP: 30.0000 mL | Freq: Every day | ORAL | Status: DC | PRN

## 2021-03-04 MED ORDER — CHOLECALCIFEROL 10 MCG (400 UNIT) PO TABS
400.0000 [IU] | ORAL_TABLET | Freq: Every day | ORAL | Status: DC
  Administered 2021-03-05 – 2021-03-10 (×6): 400 [IU] via ORAL
  Filled 2021-03-04 (×6): qty 1

## 2021-03-04 MED ORDER — TRIAMCINOLONE ACETONIDE 0.1 % EX CREA: 1.0000 "application " | TOPICAL_CREAM | Freq: Every day | CUTANEOUS | Status: DC | PRN

## 2021-03-04 MED ORDER — HYDROMORPHONE HCL 1 MG/ML IJ SOLN
1.0000 mg | Freq: Once | INTRAMUSCULAR | Status: AC
  Administered 2021-03-04: 1 mg via INTRAVENOUS
  Filled 2021-03-04: qty 1

## 2021-03-04 MED ORDER — ENSURE ENLIVE PO LIQD
237.0000 mL | Freq: Two times a day (BID) | ORAL | Status: DC
  Administered 2021-03-05 – 2021-03-10 (×10): 237 mL via ORAL
  Filled 2021-03-04 (×2): qty 237

## 2021-03-04 MED ORDER — ADULT MULTIVITAMIN W/MINERALS CH
ORAL_TABLET | Freq: Every day | ORAL | Status: DC
  Administered 2021-03-05 – 2021-03-10 (×6): 1 via ORAL
  Filled 2021-03-04 (×6): qty 1

## 2021-03-04 MED ORDER — FOLIC ACID 1 MG PO TABS
1000.0000 ug | ORAL_TABLET | Freq: Every day | ORAL | Status: DC
  Administered 2021-03-05 – 2021-03-10 (×6): 1 mg via ORAL
  Filled 2021-03-04 (×6): qty 1

## 2021-03-04 MED ORDER — ACETAMINOPHEN 325 MG PO TABS
650.0000 mg | ORAL_TABLET | Freq: Four times a day (QID) | ORAL | Status: DC | PRN
  Administered 2021-03-08 – 2021-03-09 (×3): 650 mg via ORAL
  Filled 2021-03-04 (×3): qty 2

## 2021-03-04 MED ORDER — CEPHALEXIN 250 MG PO CAPS
250.0000 mg | ORAL_CAPSULE | Freq: Three times a day (TID) | ORAL | Status: DC
  Administered 2021-03-04 – 2021-03-08 (×13): 250 mg via ORAL
  Filled 2021-03-04 (×14): qty 1

## 2021-03-04 MED ORDER — VITAMIN D3 10 MCG (400 UNIT) PO CAPS: 400.0000 [IU] | ORAL_CAPSULE | Freq: Every day | ORAL | Status: DC

## 2021-03-04 MED ORDER — IOHEXOL 300 MG/ML  SOLN
100.0000 mL | Freq: Once | INTRAMUSCULAR | Status: AC | PRN
  Administered 2021-03-04: 100 mL via INTRAVENOUS

## 2021-03-04 NOTE — ED Triage Notes (Signed)
Per EMS, patient from home with lower abdominal pain x2 days. Seen for same x2 days ago. Hx breast cancer.

## 2021-03-04 NOTE — H&P (Addendum)
History and Physical   Leslie [name] KTG:[id number] DOB: 01-11-1946 DOA: 03/04/2021  PCP: Mayra Neer, MD   Patient coming from: Home  Chief Complaint: Abdominal pain  HPI: Leslie [name] is a 76 y.o. 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  Patient initially began to experience abdominal pain 2 days ago was seen in the ED 2 days ago for the same.  Labs and CT were reassuring but there was significant stool burden/constipation present.  This improved with efforts for cleanout and large bowel movement patient was subsequently discharged home on MiraLAX.  After returning home she has not had a repeat bowel movement despite compliance with MiraLAX.  She also states her abdominal pain returned and has been severe as well.  She has passed some gas but has also had nausea and decreased p.o. intake.  EDP did discuss possibility of discharge home versus admission and patient is anxious about going home and also has abnormal lab values as below.  Patient denies fevers, chills, chest pain, shortness of breath.  ED Course: Vital signs in the ED stable.  Lab work-up showed CMP with sodium 125, chloride 92, calcium 8.3 which corrects considering albumin 3.0, protein 5.9, ALT stable at 67.  CBC with platelets stable at 124.  Lipase normal.  Urinalysis with small leuks and rare bacteria with urine culture pending.  CT of the abdomen pelvis showed no obstruction or free air, no hydronephrosis, bilateral pleural effusions consistent with associated atelectasis versus pneumonia, bony mets also noted.  Patient received Dilaudid, Keflex, Zofran, IV fluids in the ED.  Review of Systems: As per HPI otherwise all other systems reviewed and are negative.  Past Medical History:  Diagnosis Date   Benign breast cyst in female    Right breast   Cancer of  left breast ([place]) 2010   Depression    History of blood transfusion    "related to tubal pregnancy"   Hypertension    PAD (peripheral artery disease) ([place])     Past Surgical History:  Procedure Laterality Date   ABDOMINAL HYSTERECTOMY  1980s   BREAST CYST EXCISION Right ~ 2005   BREAST LUMPECTOMY Left 11/23/2008   ECTOPIC PREGNANCY SURGERY  1966   LOWER EXTREMITY ANGIOGRAM  06/19/2017   LOWER EXTREMITY ANGIOGRAPHY N/A 06/19/2017   Procedure: LOWER EXTREMITY ANGIOGRAPHY;  Surgeon: Lorretta Harp, MD;  Location: [facility];  Service: Cardiovascular;  Laterality: N/A;   PERIPHERAL VASCULAR BALLOON ANGIOPLASTY Left 06/19/2017   SFA    PERIPHERAL VASCULAR BALLOON ANGIOPLASTY  06/19/2017   Procedure: PERIPHERAL VASCULAR BALLOON ANGIOPLASTY;  Surgeon: Lorretta Harp, MD;  Location: [facility];  Service: Cardiovascular;;  SFA left    Social History  reports that she has been smoking cigarettes. She has a 22.50 pack-year smoking history. She has never used smokeless tobacco. She reports current alcohol use. She reports that she does not use drugs.  Allergies  Allergen Reactions   Aspirin Nausea And Vomiting   Cetirizine Other (See Comments)    Chest Pain    Rosuvastatin Other (See Comments)    MD suspended this due to bone pain    Family History  Problem Relation Age of Onset   Hypertension Mother    Seizures Mother    Heart disease Father    Hypertension Sister   Reviewed on admission  Prior to Admission medications  Medication Sig Start Date End Date Taking? Authorizing Provider  anastrozole (ARIMIDEX) 1 MG tablet Take 1 tablet (1 mg total) by mouth daily. 12/12/20  Yes Magrinat, Virgie Dad, MD  Cholecalciferol (VITAMIN D3) 10 MCG (400 UNIT) CAPS Take 400 Units by mouth daily with breakfast.   Yes [provider]  dexamethasone (DECADRON) 4 MG tablet Take 1 tablet (4 mg total) by mouth every 12 (twelve) hours. 02/28/21  Yes Iruku, Arletha Pili, MD   Evening Primrose Oil 1000 MG CAPS Take 1,000 mg by mouth daily.   Yes [provider]  feeding supplement (ENSURE ENLIVE / ENSURE PLUS) LIQD Take 237 mLs by mouth 2 (two) times daily between meals. 02/19/21  Yes Annita Brod, MD  fluocinonide cream (LIDEX) 6.06 % Apply 1 application topically 2 (two) times daily as needed (for irritation- affected areas).   Yes [provider]  folic acid (FOLVITE) 301 MCG tablet Take 400 mcg by mouth daily.    Yes [provider]  lidocaine (LIDODERM) 5 % Place 1 patch onto the skin See admin instructions. Apply 1 patch to the lower back once a day and remove & discard the patch within 12 hours or as directed by MD 11/09/20  Yes Magrinat, Virgie Dad, MD  mirtazapine (REMERON) 7.5 MG tablet Take 7.5 mg by mouth at bedtime.   Yes [provider]  montelukast (SINGULAIR) 10 MG tablet Take 10 mg by mouth at bedtime.   Yes [provider]  Multiple Vitamin (MULTIVITAMIN PO) Take 1 tablet by mouth daily with breakfast.   Yes [provider]  nitrofurantoin, macrocrystal-monohydrate, (MACROBID) 100 MG capsule Take 1 capsule (100 mg total) by mouth at bedtime. 02/19/21  Yes Annita Brod, MD  ondansetron (ZOFRAN) 8 MG tablet Take 8 mg by mouth every 8 (eight) hours as needed for nausea or vomiting. 11/09/20  Yes Magrinat, Virgie Dad, MD  oxyCODONE-acetaminophen (PERCOCET/ROXICET) 5-325 MG tablet Take 1 tablet by mouth every 6 (six) hours as needed for severe pain. 02/19/21  Yes Annita Brod, MD  triamcinolone cream (KENALOG) 0.1 % Apply 1 application topically daily as needed (for itching- affectes sites). 05/07/19  Yes [provider]  vitamin E 400 UNIT capsule Take 400 Units by mouth daily.   Yes [provider]    Physical Exam: Vitals:   03/04/21 1630 03/04/21 1700 03/04/21 1800 03/04/21 1930  BP: 130/72 (!) 156/67 135/68 (!) 142/71  Pulse: 66 70 69 66  Resp: 15 15 19 18   Temp:       TempSrc:      SpO2: 92% 90% 91% (!) 86%    Physical Exam Constitutional:      General: She is not in acute distress.    Appearance: Normal appearance.  HENT:     Head: Normocephalic and atraumatic.     Mouth/Throat:     Mouth: Mucous membranes are moist.     Pharynx: Oropharynx is clear.  Eyes:     Extraocular Movements: Extraocular movements intact.     Pupils: Pupils are equal, round, and reactive to light.  Cardiovascular:     Rate and Rhythm: Normal rate and regular rhythm.     Pulses: Normal pulses.     Heart sounds: Normal heart sounds.  Pulmonary:     Effort: Pulmonary effort is normal. No respiratory distress.     Breath sounds: Normal breath sounds.  Abdominal:     General: Bowel sounds are normal. There is no distension.  Palpations: Abdomen is soft.     Tenderness: There is abdominal tenderness in the left upper quadrant.  Musculoskeletal:        General: No swelling or deformity.  Skin:    General: Skin is warm and dry.  Neurological:     General: No focal deficit present.     Mental Status: Mental status is at baseline.    Labs on Admission: I have personally reviewed following labs and imaging studies  CBC: Recent Labs  Lab 02/28/21 1322 03/02/21 2010 03/04/21 1225  WBC 4.2 4.7 5.2  NEUTROABS 3.4  --   --   HGB 13.4 13.8 13.3  HCT 38.7 41.3 39.1  MCV 92.4 94.9 92.9  PLT 132* 132* 124*    Basic Metabolic Panel: Recent Labs  Lab 02/28/21 1322 03/02/21 2010 03/04/21 1225  NA 133* 132* 125*  K 4.0 4.2 4.1  CL 98 96* 92*  CO2 27 26 23   GLUCOSE 195* 208* 137*  BUN 30* 25* 20  CREATININE 0.58 0.58 0.48  CALCIUM 9.2 9.0 8.3*    GFR: Estimated Creatinine Clearance: 42.9 mL/min (by C-G formula based on SCr of 0.48 mg/dL).  Liver Function Tests: Recent Labs  Lab 02/28/21 1322 03/02/21 2010 03/04/21 1225  AST 33 39 40  ALT 64* 73* 67*  ALKPHOS 92 86 70  BILITOT 0.6 0.6 0.6  PROT 6.4* 6.2* 5.9*  ALBUMIN 3.3* 3.2* 3.0*    Urine  analysis:    Component Value Date/Time   COLORURINE YELLOW 03/04/2021 1415   APPEARANCEUR HAZY (A) 03/04/2021 1415   LABSPEC 1.030 03/04/2021 1415   PHURINE 6.0 03/04/2021 1415   GLUCOSEU NEGATIVE 03/04/2021 1415   HGBUR NEGATIVE 03/04/2021 1415   BILIRUBINUR NEGATIVE 03/04/2021 1415   KETONESUR 5 (A) 03/04/2021 1415   PROTEINUR NEGATIVE 03/04/2021 1415   UROBILINOGEN 0.2 08/17/2010 1105   NITRITE NEGATIVE 03/04/2021 1415   LEUKOCYTESUR SMALL (A) 03/04/2021 1415    Radiological Exams on Admission: CT Abdomen Pelvis W Contrast  Result Date: 03/04/2021 CLINICAL DATA:  Abdominal pain, metastatic breast carcinoma EXAM: CT ABDOMEN AND PELVIS WITH CONTRAST TECHNIQUE: Multidetector CT imaging of the abdomen and pelvis was performed using the standard protocol following bolus administration of intravenous contrast. RADIATION DOSE REDUCTION: This exam was performed according to the departmental dose-optimization program which includes automated exposure control, adjustment of the mA and/or kV according to patient size and/or use of iterative reconstruction technique. CONTRAST:  138mL OMNIPAQUE IOHEXOL 300 MG/ML  SOLN COMPARISON:  03/02/2021 FINDINGS: Lower chest: Small to moderate bilateral pleural effusions are seen, more so on the left side. There are patchy infiltrates in the posterior aspects of both lower lung fields with interval worsening. Hepatobiliary: No focal abnormality is seen in the liver. There is subtle increased density in lumen of gallbladder, possibly due to vicarious excretion of iodinated contrast from previous CT. There is no wall thickening in gallbladder. Pancreas: No focal abnormality is seen. There is prominence of pancreatic duct in the head and body. Spleen: Spleen appears smaller than usual in size. Adrenals/Urinary Tract: Adrenals are unremarkable. There is no hydronephrosis. There are no renal or ureteral stones. There is 1.6 cm smooth marginated fluid density lesion in the  midportion of left kidney, possibly a cyst urinary bladder is not distended. Foley catheter is seen in the bladder. Stomach/Bowel: Stomach is unremarkable. Small bowel loops are not dilated. Appendix is not distinctly seen. There is no focal pericecal inflammation. There is no significant wall thickening in the colon.  There is no pericolic stranding or fluid collection. Vascular/Lymphatic: Arterial calcifications are seen in the aorta and its major branches. Reproductive: Uterus is not seen. There are no dominant adnexal masses. Other: There is possible trace amount of free fluid in the right side of pelvis. There is no pneumoperitoneum. Small right inguinal hernia containing fat is seen. There is subcutaneous edema in the abdominal wall. Musculoskeletal: There is patchy enhancement in the paraspinal muscles, right iliacus muscle and gluteal muscles on both sides, more so on the left side. Left psoas muscle appears smaller than usual. These findings have not changed significantly. There are numerous lytic lesions of varying sizes in the skeletal structures consistent with skeletal metastatic disease. IMPRESSION: There is no evidence of intestinal obstruction or pneumoperitoneum. There is no hydronephrosis. Small to moderate bilateral pleural effusions, more so on the left side. There are patchy infiltrates in both lower lung fields with possible slight worsening suggesting atelectasis/pneumonia. Extensive skeletal metastatic disease. There is abnormal patchy enhancement in multiple muscles suggesting possible active inflammatory or neoplastic process. Other findings as described in the body of the report. Extensive skeletal metastatic disease. There is patchy enhancement in the multiple muscles as described in the body of the report which may suggest inflammatory or neoplastic process in the soft tissues. Electronically Signed   By: Elmer Picker M.D.   On: 03/04/2021 17:10   CT ABDOMEN PELVIS W  CONTRAST  Result Date: 03/02/2021 CLINICAL DATA:  Abdomen pain EXAM: CT ABDOMEN AND PELVIS WITH CONTRAST TECHNIQUE: Multidetector CT imaging of the abdomen and pelvis was performed using the standard protocol following bolus administration of intravenous contrast. RADIATION DOSE REDUCTION: This exam was performed according to the departmental dose-optimization program which includes automated exposure control, adjustment of the mA and/or kV according to patient size and/or use of iterative reconstruction technique. CONTRAST:  139mL OMNIPAQUE IOHEXOL 300 MG/ML  SOLN COMPARISON:  CT 08/14/2018, CT 02/08/2021 FINDINGS: Lower chest: Small right and small moderate left pleural effusions. Normal cardiac size. Partial consolidations in the lower lobes likely due to atelectasis. Hepatobiliary: No focal liver abnormality is seen. No gallstones, gallbladder wall thickening, or biliary dilatation. Pancreas: Unremarkable. No pancreatic ductal dilatation or surrounding inflammatory changes. Spleen: Normal in size without focal abnormality. Adrenals/Urinary Tract: Adrenal glands are within normal limits. No hydronephrosis. Cyst midpole left kidney. Foley catheter in the bladder. Stomach/Bowel: The stomach is nonenlarged. No dilated small bowel. No acute bowel wall thickening. Large stool burden. Vascular/Lymphatic: Advanced aortic atherosclerosis. No aneurysm. No increasing adenopathy. Reproductive: Status post hysterectomy. No adnexal masses. Other: Negative for pelvic effusion or free air. Musculoskeletal: Heterogenous masslike areas of muscle enhancement involving the gluteal regions, right anterior hip muscles, right iliacus muscle, and the paraspinal musculature. Extensive lytic and sclerotic lesions consistent with widespread skeletal metastatic disease. IMPRESSION: 1. Negative for bowel obstruction or bowel wall thickening. Large stool burden suggesting constipation. 2. Small moderate left and small right-sided pleural  effusion with probable passive atelectasis at the left greater than right lower lobes 3. 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 Extensive heterogeneous masslike enhancement involving the gluteal muscles, paraspinal muscles, and right hip muscles concerning for soft tissue metastatic disease. Electronically Signed   By: Donavan Foil M.D.   On: 03/02/2021 22:29    EKG: Not performed in the ED.  Assessment/Plan Principal Problem:   Hyponatremia Active Problems:   Malignant neoplasm of upper-outer quadrant of left breast in female, estrogen receptor positive (HCC)   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HCC)   Abdominal pain  Hyponatremia > Patient incidentally noted to have moderate hyponatremia on lab work in the ED.  Sodium 125 and this is acute as sodium was checked less than 48 hours ago during previous visit and was 132. > She reports decreased p.o. intake in the setting of abdominal pain and nausea.  Likely hypovolemic hyponatremia.  IV fluids initiated in the ED with normal saline at 100 cc an hour. > Less concern for overcorrection considering patient's hyponatremia is acute.  However will correct at a steady pace with IV fluids and monitor with serial BMPs. - Trend BMP every 4 hours - Adjust current rate of IV fluids based on change in sodium on repeat BMP now, leave at 100 cc an hour for now - Check serum osmolality, urine osmolality, urine sodium for completeness  Abdominal pain History of constipation > No clear etiology for patient's recurrent abdominal pain at this time.  CT negative for acute abnormality.  Patient is on opioid medication for pain could be some degree of slow transit, previously her pain was relieved with large bowel movement but no large stool burden noted on CT today. - Continue with supportive care - Continue scheduled laxatives - As needed laxatives MiraLAX for mild, sorbitol for moderate, Fleet enema for severe constipation  Breast cancer > Known bony  metastasis and leptomeningeal disease, recent imaging was suspicious for MSK mets. - Following with Dr. Chryl Heck of oncology.  With leptomeningeal disease, per oncology note, patient has poor prognosis possibly less than 6 months. - No current plan for intensive chemotherapy - Still in the process of deciding on approach for treatment versus palliation as she was apprised by the prognosis - Continue home anastrozole, Decadron - Continue as needed oxycodone  DVT prophylaxis: Lovenox Code Status:   DNR Family Communication:  None on admission.  Patient states family is up-to-date and that she is being admitted. Disposition Plan:   Patient is from:  Home  Anticipated DC to:  Home  Anticipated DC date:  1 to 2 days  Anticipated DC barriers: None  Consults called:  None Admission status:  Observation, telemetry  Severity of Illness: The appropriate patient status for this patient is OBSERVATION. Observation status is judged to be reasonable and necessary in order to provide the required intensity of service to ensure the patient's safety. The patient's presenting symptoms, physical exam findings, and initial radiographic and laboratory data in the context of their medical condition is felt to place them at decreased risk for further clinical deterioration. Furthermore, it is anticipated that the patient will be medically stable for discharge from the hospital within 2 midnights of admission.    Marcelyn Bruins MD [facility]  How to contact the [facility] Attending or Consulting provider [place] or covering provider during after hours [place], for this patient?   Check the care team in [facility] and look for a) attending/consulting [facility] provider listed and b) the [facility] team listed Log into [email or web address] and use [place]'s universal password to access. If you do not have the password, please contact the hospital operator. Locate the [facility] provider you are looking for under [facility] and page  to a number that you can be directly reached. If you still have difficulty reaching the provider, please page the [facility] (Director on Call) for the Hospitalists listed on amion for assistance.  03/04/2021, 8:33 PM

## 2021-03-04 NOTE — ED Notes (Signed)
Pt NAD in bed, a/ox4, states she has been having "bad" lower ABD pain that is causing her to not be able to sleep, x several days. Pt states she was here Friday for same. +constipation, denies n/v/d fever. ABD flat, soft, nontender.

## 2021-03-04 NOTE — ED Notes (Signed)
Patient transported to CT 

## 2021-03-04 NOTE — ED Provider Notes (Signed)
[facility] DEPT Provider Note   CSN: [id number] Arrival date & time: 03/04/21  1153     History  Chief Complaint  Patient presents with   Abdominal Pain    Leslie [name] is a 76 y.o. female.  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  Patient presents the emergency department with lower abdominal pain.  She states that she started experiencing this pain last Friday.  She was seen in our emergency department Friday night.  She had fairly reassuring labs and had a CT scan that was negative for bowel obstruction or wall thickening.  It did suggest constipation with a large amount of stool burden as well as worsening mets to her gluteal muscles, paraspinal muscles, and right hip muscles.  She was treated with milk of magnesia, Fleet enema, and pain medication for her symptoms.  Per the patient and her family member, patient had a very large bowel movement following this treatment and had a small bowel movement following this time in the ED.  Pain had been alleviated prior to discharge.  She was discharged home with MiraLAX.  Patient has been compliant with the MiraLAX for 2 days, however has not had a repeat bowel movement since.  The abdominal pain returned shortly after her discharge and she states it is very severe.  She is passing some gas, however she is very nauseous and has had decreased p.o. intake because she feels like she is in the throw up every time she eats something.  Denies any fevers or chills, chest pain, shortness of breath, dizziness, or weakness.   Abdominal Pain Associated symptoms: constipation and nausea   Associated symptoms: no chest pain, no chills, no diarrhea, no dysuria, no fever, no hematuria, no shortness of breath and no vomiting       Home Medications Prior to Admission medications   Medication Sig Start Date End Date Taking? Authorizing Provider  anastrozole (ARIMIDEX)  1 MG tablet Take 1 tablet (1 mg total) by mouth daily. 12/12/20  Yes Magrinat, Virgie Dad, MD  Cholecalciferol (VITAMIN D3) 10 MCG (400 UNIT) CAPS Take 400 Units by mouth daily with breakfast.   Yes [provider]  dexamethasone (DECADRON) 4 MG tablet Take 1 tablet (4 mg total) by mouth every 12 (twelve) hours. 02/28/21  Yes Iruku, Arletha Pili, MD  Evening Primrose Oil 1000 MG CAPS Take 1,000 mg by mouth daily.   Yes [provider]  feeding supplement (ENSURE ENLIVE / ENSURE PLUS) LIQD Take 237 mLs by mouth 2 (two) times daily between meals. 02/19/21  Yes Annita Brod, MD  fluocinonide cream (LIDEX) 7.32 % Apply 1 application topically 2 (two) times daily as needed (for irritation- affected areas).   Yes [provider]  folic acid (FOLVITE) 202 MCG tablet Take 400 mcg by mouth daily.    Yes [provider]  lidocaine (LIDODERM) 5 % Place 1 patch onto the skin See admin instructions. Apply 1 patch to the lower back once a day and remove & discard the patch within 12 hours or as directed by MD 11/09/20  Yes Magrinat, Virgie Dad, MD  mirtazapine (REMERON) 7.5 MG tablet Take 7.5 mg by mouth at bedtime.   Yes [provider]  montelukast (SINGULAIR) 10 MG tablet Take 10 mg by mouth at bedtime.   Yes [provider]  Multiple Vitamin (MULTIVITAMIN PO) Take 1 tablet by mouth daily with breakfast.   Yes [provider]  nitrofurantoin, macrocrystal-monohydrate, (MACROBID) 100 MG capsule Take 1 capsule (100 mg total) by mouth at bedtime. 02/19/21  Yes Annita Brod, MD  ondansetron (ZOFRAN) 8 MG tablet Take 8 mg by mouth every 8 (eight) hours as needed for nausea or vomiting. 11/09/20  Yes Magrinat, Virgie Dad, MD  oxyCODONE-acetaminophen (PERCOCET/ROXICET) 5-325 MG tablet Take 1 tablet by mouth every 6 (six) hours as needed for severe pain. 02/19/21  Yes Annita Brod, MD  triamcinolone cream (KENALOG) 0.1 % Apply 1 application topically daily  as needed (for itching- affectes sites). 05/07/19  Yes [provider]  vitamin E 400 UNIT capsule Take 400 Units by mouth daily.   Yes [provider]      Allergies    Aspirin, Cetirizine, and Rosuvastatin    Review of Systems   Review of Systems  Constitutional:  Positive for appetite change. Negative for chills and fever.  Respiratory:  Negative for shortness of breath.   Cardiovascular:  Negative for chest pain.  Gastrointestinal:  Positive for abdominal pain, constipation and nausea. Negative for abdominal distention, blood in stool, diarrhea and vomiting.  Genitourinary:  Positive for pelvic pain. Negative for dysuria, flank pain and hematuria.  Musculoskeletal:  Negative for back pain.  Neurological:  Negative for dizziness and weakness.  Psychiatric/Behavioral:  Negative for confusion.   All other systems reviewed and are negative.  Physical Exam Updated Vital Signs BP (!) 156/67    Pulse 70    Temp 98.3 F (36.8 C) (Oral)    Resp 15    SpO2 90%  Physical Exam Vitals and nursing note reviewed.  Constitutional:      General: She is not in acute distress.    Appearance: Normal appearance. She is ill-appearing. She is not toxic-appearing or diaphoretic.     Comments: Chronically ill appearing  HENT:     Head: Normocephalic and atraumatic.     Nose: No nasal deformity.     Mouth/Throat:     Lips: Pink. No lesions.     Mouth: No injury, lacerations, oral lesions or angioedema.     Pharynx: Uvula midline. No uvula swelling.  Eyes:     General: Gaze aligned appropriately. Scleral icterus present.        Right eye: No discharge.        Left eye: No discharge.     Conjunctiva/sclera: Conjunctivae normal.     Right eye: Right conjunctiva is not injected. No exudate or hemorrhage.    Left eye: Left conjunctiva is not injected. No exudate or hemorrhage. Cardiovascular:     Rate and Rhythm: Normal rate and regular rhythm.     Pulses: Normal pulses.           Radial pulses are 2+ on the right side and 2+ on the left side.       Dorsalis pedis pulses are 2+ on the right side and 2+ on the left side.     Heart sounds: Normal heart sounds, S1 normal and S2 normal. Heart sounds not distant. No murmur heard.   No friction rub. No gallop. No S3 or S4 sounds.  Pulmonary:     Effort: Pulmonary effort is normal. No accessory muscle usage or respiratory distress.     Breath sounds: Normal breath sounds. No stridor. No wheezing, rhonchi or rales.  Chest:     Chest wall: No tenderness.  Abdominal:     General: Abdomen is flat. Bowel sounds are normal. There is no distension.  Palpations: Abdomen is soft. There is no mass or pulsatile mass.     Tenderness: There is abdominal tenderness in the right lower quadrant, suprapubic area and left lower quadrant. There is no right CVA tenderness, left CVA tenderness, guarding or rebound.     Hernia: No hernia is present.  Musculoskeletal:     Right lower leg: No edema.     Left lower leg: No edema.  Skin:    General: Skin is warm and dry.     Coloration: Skin is not cyanotic, jaundiced, mottled or pale.     Findings: No bruising, erythema, lesion or rash.  Neurological:     General: No focal deficit present.     Mental Status: She is alert and oriented to person, place, and time.     GCS: GCS eye subscore is 4. GCS verbal subscore is 5. GCS motor subscore is 6.  Psychiatric:        Mood and Affect: Mood normal.        Behavior: Behavior normal. Behavior is cooperative.    ED Results / Procedures / Treatments   Labs (all labs ordered are listed, but only abnormal results are displayed) Labs Reviewed  COMPREHENSIVE METABOLIC PANEL - Abnormal; Notable for the following components:      Result Value   Sodium 125 (*)    Chloride 92 (*)    Glucose, Bld 137 (*)    Calcium 8.3 (*)    Total Protein 5.9 (*)    Albumin 3.0 (*)    ALT 67 (*)    All other components within normal limits  CBC - Abnormal;  Notable for the following components:   RDW 17.1 (*)    Platelets 124 (*)    All other components within normal limits  URINALYSIS, ROUTINE W REFLEX MICROSCOPIC - Abnormal; Notable for the following components:   APPearance HAZY (*)    Ketones, ur 5 (*)    Leukocytes,Ua SMALL (*)    Bacteria, UA RARE (*)    Crystals PRESENT (*)    All other components within normal limits  URINE CULTURE  LIPASE, BLOOD    EKG None  Radiology CT Abdomen Pelvis W Contrast  Result Date: 03/04/2021 CLINICAL DATA:  Abdominal pain, metastatic breast carcinoma EXAM: CT ABDOMEN AND PELVIS WITH CONTRAST TECHNIQUE: Multidetector CT imaging of the abdomen and pelvis was performed using the standard protocol following bolus administration of intravenous contrast. RADIATION DOSE REDUCTION: This exam was performed according to the departmental dose-optimization program which includes automated exposure control, adjustment of the mA and/or kV according to patient size and/or use of iterative reconstruction technique. CONTRAST:  160mL OMNIPAQUE IOHEXOL 300 MG/ML  SOLN COMPARISON:  03/02/2021 FINDINGS: Lower chest: Small to moderate bilateral pleural effusions are seen, more so on the left side. There are patchy infiltrates in the posterior aspects of both lower lung fields with interval worsening. Hepatobiliary: No focal abnormality is seen in the liver. There is subtle increased density in lumen of gallbladder, possibly due to vicarious excretion of iodinated contrast from previous CT. There is no wall thickening in gallbladder. Pancreas: No focal abnormality is seen. There is prominence of pancreatic duct in the head and body. Spleen: Spleen appears smaller than usual in size. Adrenals/Urinary Tract: Adrenals are unremarkable. There is no hydronephrosis. There are no renal or ureteral stones. There is 1.6 cm smooth marginated fluid density lesion in the midportion of left kidney, possibly a cyst urinary bladder is not  distended. Foley catheter is  seen in the bladder. Stomach/Bowel: Stomach is unremarkable. Small bowel loops are not dilated. Appendix is not distinctly seen. There is no focal pericecal inflammation. There is no significant wall thickening in the colon. There is no pericolic stranding or fluid collection. Vascular/Lymphatic: Arterial calcifications are seen in the aorta and its major branches. Reproductive: Uterus is not seen. There are no dominant adnexal masses. Other: There is possible trace amount of free fluid in the right side of pelvis. There is no pneumoperitoneum. Small right inguinal hernia containing fat is seen. There is subcutaneous edema in the abdominal wall. Musculoskeletal: There is patchy enhancement in the paraspinal muscles, right iliacus muscle and gluteal muscles on both sides, more so on the left side. Left psoas muscle appears smaller than usual. These findings have not changed significantly. There are numerous lytic lesions of varying sizes in the skeletal structures consistent with skeletal metastatic disease. IMPRESSION: There is no evidence of intestinal obstruction or pneumoperitoneum. There is no hydronephrosis. Small to moderate bilateral pleural effusions, more so on the left side. There are patchy infiltrates in both lower lung fields with possible slight worsening suggesting atelectasis/pneumonia. Extensive skeletal metastatic disease. There is abnormal patchy enhancement in multiple muscles suggesting possible active inflammatory or neoplastic process. Other findings as described in the body of the report. Extensive skeletal metastatic disease. There is patchy enhancement in the multiple muscles as described in the body of the report which may suggest inflammatory or neoplastic process in the soft tissues. Electronically Signed   By: Elmer Picker M.D.   On: 03/04/2021 17:10   CT ABDOMEN PELVIS W CONTRAST  Result Date: 03/02/2021 CLINICAL DATA:  Abdomen pain EXAM: CT  ABDOMEN AND PELVIS WITH CONTRAST TECHNIQUE: Multidetector CT imaging of the abdomen and pelvis was performed using the standard protocol following bolus administration of intravenous contrast. RADIATION DOSE REDUCTION: This exam was performed according to the departmental dose-optimization program which includes automated exposure control, adjustment of the mA and/or kV according to patient size and/or use of iterative reconstruction technique. CONTRAST:  138mL OMNIPAQUE IOHEXOL 300 MG/ML  SOLN COMPARISON:  CT 08/14/2018, CT 02/08/2021 FINDINGS: Lower chest: Small right and small moderate left pleural effusions. Normal cardiac size. Partial consolidations in the lower lobes likely due to atelectasis. Hepatobiliary: No focal liver abnormality is seen. No gallstones, gallbladder wall thickening, or biliary dilatation. Pancreas: Unremarkable. No pancreatic ductal dilatation or surrounding inflammatory changes. Spleen: Normal in size without focal abnormality. Adrenals/Urinary Tract: Adrenal glands are within normal limits. No hydronephrosis. Cyst midpole left kidney. Foley catheter in the bladder. Stomach/Bowel: The stomach is nonenlarged. No dilated small bowel. No acute bowel wall thickening. Large stool burden. Vascular/Lymphatic: Advanced aortic atherosclerosis. No aneurysm. No increasing adenopathy. Reproductive: Status post hysterectomy. No adnexal masses. Other: Negative for pelvic effusion or free air. Musculoskeletal: Heterogenous masslike areas of muscle enhancement involving the gluteal regions, right anterior hip muscles, right iliacus muscle, and the paraspinal musculature. Extensive lytic and sclerotic lesions consistent with widespread skeletal metastatic disease. IMPRESSION: 1. Negative for bowel obstruction or bowel wall thickening. Large stool burden suggesting constipation. 2. Small moderate left and small right-sided pleural effusion with probable passive atelectasis at the left greater than right  lower lobes 3. 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 Extensive heterogeneous masslike enhancement involving the gluteal muscles, paraspinal muscles, and right hip muscles concerning for soft tissue metastatic disease. Electronically Signed   By: Donavan Foil M.D.   On: 03/02/2021 22:29    Procedures Procedures  Cardiac monitoring while in  ED  Medications Ordered in ED Medications  iohexol (OMNIPAQUE) 9 MG/ML oral solution (has no administration in time range)  iohexol (OMNIPAQUE) 9 MG/ML oral solution 500 mL (has no administration in time range)  sodium chloride (PF) 0.9 % injection (has no administration in time range)  0.9 %  sodium chloride infusion (1,000 mLs Intravenous New Bag/Given 03/04/21 1752)  cephALEXin (KEFLEX) capsule 250 mg (250 mg Oral Given 03/04/21 1750)  HYDROmorphone (DILAUDID) injection 1 mg (1 mg Intravenous Given 03/04/21 1408)  ondansetron (ZOFRAN) injection 4 mg (4 mg Intravenous Given 03/04/21 1407)  sodium chloride (PF) 0.9 % injection (  Given by Other 03/04/21 1423)  iohexol (OMNIPAQUE) 300 MG/ML solution 100 mL (100 mLs Intravenous Contrast Given 03/04/21 1643)    ED Course/ Medical Decision Making/ A&P                           Medical Decision Making Problems Addressed: Acute cystitis without hematuria: complicated acute illness or injury Failure to thrive in adult: acute illness or injury with systemic symptoms that poses a threat to life or bodily functions Hyponatremia: complicated acute illness or injury Lower abdominal pain: complicated acute illness or injury with systemic symptoms  Amount and/or Complexity of Data Reviewed Independent Historian:     Details: Patient provided most of history, but she is accompanied by her son who helped with some of the finer details External Data Reviewed: labs, radiology and notes.    Details: Reviewed previous admission labs, notes, and radiology. Reviewed recent ED notes, labs, and imaging Labs:  ordered. Decision-making details documented in ED Course. Radiology: ordered. Decision-making details documented in ED Course.  Risk Prescription drug management. Decision regarding hospitalization.   This is a 76 y.o. 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  She was recently evaluated in our emergency department on Friday and was diagnosed with constipation.  The pain is returned and she is continuing to have it.  She has not had a bowel movement since the large bowel movements that she had on Friday after her enema.  He has associated nausea and poor p.o. intake.  Plan to repeat CT scan to evaluate stool burden and possible obstruction. I had my attending physician, Dr. Pearline Cables evaluate this patient who agrees with labs and CT scan at this time  I personally reviewed all laboratory work and imaging. Abnormal results outlined below. Labs notable for a decrease in sodium from 133 to 125 in two days. She does not have evidence of an AKI or any obvious labs demonstrating severe dehydration. Albumin chronically low indicating likely long term malnutrition. Urine notable for small LE, small ketones, rare bacteria, 11-20 WBC, mucus, yeast, and crystals present. Overall this is really nonspecific. May indicate possible UTI, but these findings could also be present in several other infectious processes. In comparison to Friday's UA, it is actually improved, without positive blood, and with the addition of crystals.   CT scan There is no evidence of intestinal obstruction or pneumoperitoneum.  There is no hydronephrosis.     Small to moderate bilateral pleural effusions, more so on the left  side. There are patchy infiltrates in both lower lung fields with  possible slight worsening suggesting atelectasis/pneumonia.     Extensive skeletal metastatic disease. There is abnormal patchy  enhancement in  multiple muscles suggesting possible active  inflammatory or neoplastic  process.     Other findings as described in the body of the report.     Extensive skeletal metastatic disease. There is patchy enhancement  in the multiple muscles as described in the body of the report which  may suggest inflammatory or neoplastic process in the soft tissues.    Impression: There was no evidence of bowel obstruction or continued constipation on CT.  It is possible that abdominal pain is caused by acute cystitis.  On reevaluation, patient is continuing to have pain following IV Dilaudid and zofran. She is unable to tolerate p.o. intake at this time.  I discussed discharge versus admission with patient and she prefers to be admitted at this time. She is acutely hyponatremic to 125 and not eating or drinking at home so I feel like admission for rehydration and pain management is reasonable.  5:59 PM Care of Leslie [name]  transferred to [facility] and Dr. Pearline Cables at the end of my shift as the patient will require reassessment once labs/imaging have resulted. Patient presentation, ED course, and plan of care discussed with review of all pertinent labs and imaging. Please see his/her note for further details regarding further ED course and disposition. Plan at time of handoff is return hospitalist phone call for admission. This may be altered or completely changed at the discretion of the oncoming team pending results of further workup.   I have discussed this patient with my attending physician, Dr. Pearline Cables who has made changes to the plan accordingly.  Portions of this note were generated with [employer]. Dictation errors may occur despite best attempts at proofreading.  Final Clinical Impression(s) / ED Diagnoses Final diagnoses:  Hyponatremia  Lower abdominal pain  Failure to thrive in adult  Acute cystitis without hematuria    Rx / DC Orders ED Discharge Orders     None          Adolphus Birchwood, PA-C 24/40/10 2725    Lianne Cure, DO 36/64/40 1002

## 2021-03-05 ENCOUNTER — Ambulatory Visit: Payer: Medicare Other

## 2021-03-05 ENCOUNTER — Other Ambulatory Visit: Payer: Self-pay

## 2021-03-05 ENCOUNTER — Encounter: Payer: Self-pay | Admitting: Oncology

## 2021-03-05 ENCOUNTER — Encounter (HOSPITAL_COMMUNITY): Payer: Self-pay | Admitting: Internal Medicine

## 2021-03-05 DIAGNOSIS — C7951 Secondary malignant neoplasm of bone: Secondary | ICD-10-CM | POA: Diagnosis present

## 2021-03-05 DIAGNOSIS — J90 Pleural effusion, not elsewhere classified: Secondary | ICD-10-CM | POA: Diagnosis present

## 2021-03-05 DIAGNOSIS — Z170 Estrogen receptor positive status [ER+]: Secondary | ICD-10-CM | POA: Diagnosis not present

## 2021-03-05 DIAGNOSIS — Z66 Do not resuscitate: Secondary | ICD-10-CM | POA: Diagnosis present

## 2021-03-05 DIAGNOSIS — Z79899 Other long term (current) drug therapy: Secondary | ICD-10-CM | POA: Diagnosis not present

## 2021-03-05 DIAGNOSIS — R627 Adult failure to thrive: Secondary | ICD-10-CM | POA: Diagnosis present

## 2021-03-05 DIAGNOSIS — R8281 Pyuria: Secondary | ICD-10-CM | POA: Diagnosis present

## 2021-03-05 DIAGNOSIS — I739 Peripheral vascular disease, unspecified: Secondary | ICD-10-CM | POA: Diagnosis present

## 2021-03-05 DIAGNOSIS — G834 Cauda equina syndrome: Secondary | ICD-10-CM | POA: Diagnosis present

## 2021-03-05 DIAGNOSIS — C7949 Secondary malignant neoplasm of other parts of nervous system: Secondary | ICD-10-CM | POA: Diagnosis present

## 2021-03-05 DIAGNOSIS — E861 Hypovolemia: Secondary | ICD-10-CM | POA: Diagnosis present

## 2021-03-05 DIAGNOSIS — R339 Retention of urine, unspecified: Secondary | ICD-10-CM | POA: Diagnosis present

## 2021-03-05 DIAGNOSIS — Z888 Allergy status to other drugs, medicaments and biological substances status: Secondary | ICD-10-CM | POA: Diagnosis not present

## 2021-03-05 DIAGNOSIS — Z853 Personal history of malignant neoplasm of breast: Secondary | ICD-10-CM | POA: Diagnosis not present

## 2021-03-05 DIAGNOSIS — F17210 Nicotine dependence, cigarettes, uncomplicated: Secondary | ICD-10-CM | POA: Diagnosis present

## 2021-03-05 DIAGNOSIS — Z7189 Other specified counseling: Secondary | ICD-10-CM | POA: Diagnosis not present

## 2021-03-05 DIAGNOSIS — E871 Hypo-osmolality and hyponatremia: Secondary | ICD-10-CM | POA: Diagnosis present

## 2021-03-05 DIAGNOSIS — Z8249 Family history of ischemic heart disease and other diseases of the circulatory system: Secondary | ICD-10-CM | POA: Diagnosis not present

## 2021-03-05 DIAGNOSIS — Z515 Encounter for palliative care: Secondary | ICD-10-CM | POA: Diagnosis not present

## 2021-03-05 DIAGNOSIS — Z20822 Contact with and (suspected) exposure to covid-19: Secondary | ICD-10-CM | POA: Diagnosis present

## 2021-03-05 DIAGNOSIS — C50412 Malignant neoplasm of upper-outer quadrant of left female breast: Secondary | ICD-10-CM | POA: Diagnosis not present

## 2021-03-05 DIAGNOSIS — I10 Essential (primary) hypertension: Secondary | ICD-10-CM | POA: Diagnosis present

## 2021-03-05 DIAGNOSIS — E785 Hyperlipidemia, unspecified: Secondary | ICD-10-CM | POA: Diagnosis present

## 2021-03-05 LAB — CBC
HCT: 39.5 % (ref 36.0–46.0)
Hemoglobin: 13.6 g/dL (ref 12.0–15.0)
MCH: 31.8 pg (ref 26.0–34.0)
MCHC: 34.4 g/dL (ref 30.0–36.0)
MCV: 92.3 fL (ref 80.0–100.0)
Platelets: 130 10*3/uL — ABNORMAL LOW (ref 150–400)
RBC: 4.28 MIL/uL (ref 3.87–5.11)
RDW: 17.2 % — ABNORMAL HIGH (ref 11.5–15.5)
WBC: 5 10*3/uL (ref 4.0–10.5)
nRBC: 0 % (ref 0.0–0.2)

## 2021-03-05 LAB — RESP PANEL BY RT-PCR (FLU A&B, COVID) ARPGX2
Influenza A by PCR: NEGATIVE
Influenza B by PCR: NEGATIVE
SARS Coronavirus 2 by RT PCR: NEGATIVE

## 2021-03-05 LAB — BASIC METABOLIC PANEL
Anion gap: 7 (ref 5–15)
Anion gap: 6 (ref 5–15)
BUN: 16 mg/dL (ref 8–23)
BUN: 12 mg/dL (ref 8–23)
CO2: 25 mmol/L (ref 22–32)
CO2: 21 mmol/L — ABNORMAL LOW (ref 22–32)
Calcium: 8.1 mg/dL — ABNORMAL LOW (ref 8.9–10.3)
Calcium: 6.5 mg/dL — ABNORMAL LOW (ref 8.9–10.3)
Chloride: 97 mmol/L — ABNORMAL LOW (ref 98–111)
Chloride: 105 mmol/L (ref 98–111)
Creatinine, Ser: 0.43 mg/dL — ABNORMAL LOW (ref 0.44–1.00)
Creatinine, Ser: <0.3 mg/dL — ABNORMAL LOW (ref 0.44–1.00)
GFR, Estimated: >60 mL/min (ref >60)
Glucose, Bld: 134 mg/dL — ABNORMAL HIGH (ref 70–99)
Glucose, Bld: 114 mg/dL — ABNORMAL HIGH (ref 70–99)
Potassium: 3.9 mmol/L (ref 3.5–5.1)
Potassium: 3.7 mmol/L (ref 3.5–5.1)
Sodium: 129 mmol/L — ABNORMAL LOW (ref 135–145)
Sodium: 132 mmol/L — ABNORMAL LOW (ref 135–145)

## 2021-03-05 LAB — OSMOLALITY, URINE: Osmolality, Ur: 917 mOsm/kg — ABNORMAL HIGH (ref 300–900)

## 2021-03-05 LAB — SODIUM, URINE, RANDOM: Sodium, Ur: 141 mmol/L

## 2021-03-05 MED ORDER — POLYETHYLENE GLYCOL 3350 17 G PO PACK
17.0000 g | PACK | Freq: Two times a day (BID) | ORAL | Status: DC
  Administered 2021-03-05 – 2021-03-10 (×11): 17 g via ORAL
  Filled 2021-03-05 (×11): qty 1

## 2021-03-05 MED ORDER — CHLORHEXIDINE GLUCONATE CLOTH 2 % EX PADS
6.0000 | MEDICATED_PAD | Freq: Every day | CUTANEOUS | Status: DC
  Administered 2021-03-05 – 2021-03-10 (×6): 6 via TOPICAL

## 2021-03-05 MED ORDER — PANTOPRAZOLE SODIUM 40 MG PO TBEC
40.0000 mg | DELAYED_RELEASE_TABLET | Freq: Two times a day (BID) | ORAL | Status: DC
  Administered 2021-03-05 – 2021-03-10 (×11): 40 mg via ORAL
  Filled 2021-03-05 (×11): qty 1

## 2021-03-05 MED ORDER — SENNOSIDES-DOCUSATE SODIUM 8.6-50 MG PO TABS
1.0000 | ORAL_TABLET | Freq: Two times a day (BID) | ORAL | Status: DC
  Administered 2021-03-05 – 2021-03-10 (×11): 1 via ORAL
  Filled 2021-03-05 (×11): qty 1

## 2021-03-05 MED ORDER — LACTULOSE 10 GM/15ML PO SOLN
20.0000 g | Freq: Once | ORAL | Status: AC
  Administered 2021-03-05: 20 g via ORAL
  Filled 2021-03-05: qty 30

## 2021-03-05 NOTE — Assessment & Plan Note (Addendum)
-  She has estrogen receptor positive breast cancer 2010, metastatic disease to bone 2021, present admission 01/2021 for gout occasional compression, leptomeningeal involvement, underwent craniospinal radiation last admission. -Prognosis is possible less than 6 months.   -Continue with anastrozole, Decadron. -Continue with oxycodone.

## 2021-03-05 NOTE — Hospital Course (Addendum)
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  Patient initially seen in the ED 2 days ago for similar complaints of abdominal pain.  CT was pursued and shows significant stool burden/constipation.  Patient received laxative and she had a large bowel movement with subsequently discharged home on MiraLAX.  She presents with recurrence of abdominal pain, no further bowel movement since discharge form ED. he reports abdominal pain as severe, accompanied by nausea and decreased oral intake.  Patient was found to have hyponatremia with a sodium level of 125, UA with small leukocytes.  CT abdomen and pelvis repeated showed no obstruction or free air, no hydronephrosis, bilateral pleural effusion consistent with associated atelectasis versus pneumonia.  Bony mets also noted.

## 2021-03-05 NOTE — Assessment & Plan Note (Addendum)
Likely secondary to poor oral intake, hypovolemia. Improved initially  with IV fluids.  Presents with a sodium level of 125. Sodium level improved to 132.-- down to 128 today. Check urine osmolality.  Continue with IV fluids.

## 2021-03-05 NOTE — Progress Notes (Signed)
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  Patient initially seen in the ED 2 days ago for similar complaints of abdominal pain.  CT was pursued and shows significant stool burden/constipation.  Patient received laxative and she had a large bowel movement with subsequently discharged home on MiraLAX.  She presents with recurrence of abdominal pain, no further bowel movement since discharge form ED. he reports abdominal pain as severe, accompanied by nausea and decreased oral intake.  Patient was found to have hyponatremia with a sodium level of 125, UA with small leukocytes.  CT abdomen and pelvis repeated showed no obstruction or free air, no hydronephrosis, bilateral pleural effusion consistent with associated atelectasis versus pneumonia.  Bony mets also noted.    Assessment and Plan: * Hyponatremia- (present on admission) Likely secondary to poor oral intake, hypovolemia. Improving with IV fluids.  Continue with IV fluids. Presents with a sodium level of 125. Sodium level improved to 132.  Pyuria Follow urine culture.   Abdominal pain -No clear etiology for patient recurrent abdominal pain.  -CT abdomen and pelvis: No evidence of intestinal obstruction or pneumoperitoneum.  No hydronephrosis.  Small to moderate bilateral pleural effusion.  Patchy infiltrates both lower lung fields.  Extensive skeletal metastatic disease.  There is abnormal patchy enhancement in multiple muscles suggesting possible active inflammatory or neoplastic process. -We  will schedule MiraLAX Senokot and will give a dose of lactulose. -She could have some radiating pain to abdomen metastatic disease to spine.   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 She follows with Dr. Chryl Heck  with oncology. She is supposed to discuss further  goals of cares with Dr. Chryl Heck  in regards if she is going to continue further  Chemotherapy.   Malignant neoplasm of upper-outer quadrant of left breast in female, estrogen receptor positive ([place]) -She has estrogen receptor positive breast cancer 2010, metastatic disease to bone 2021, present admission 01/2021 for gout occasional compression, leptomeningeal involvement, underwent craniospinal radiation last admission. -Prognosis is possible less than 6 months.  We will consult Dr. Chryl Heck  for further goals of care.  -Continue with anastrozole, Decadron. -Continue with oxycodone.          Subjective:  She report some improvement of abdominal pain.  Report poor oral intake several days.  No BM yet.    Physical Exam: Vitals:   03/05/21 0854 03/05/21 1010 03/05/21 1252 03/05/21 1300  BP: (!) 148/76 138/81  117/82  Pulse: 72 71  70  Resp: 16 19  18   Temp:   98.2 F (36.8 C)   TempSrc:   Oral   SpO2: 94% 100%  100%   General. NAD Lungs; CTA Abdomen; soft, mild tenderness.   Data Reviewed:  Prior records, CBC and Bmet reviewed.   Family Communication: care discussed with patient.   Disposition: Status is: inpatient.        Planned Discharge Destination: Home     Time spent: 45 minutes  Author: Elmarie Shiley, MD 03/05/2021 1:30 PM  For on call review [email or web address].

## 2021-03-05 NOTE — Progress Notes (Signed)
° ° °  The son [name] has called our office Saturday and requested that we come to home and discuss hospice services. We were waiting to obtain an order from outside MD to follow through with request. However he has notified us this morning that the pt was now admitted to [facility].  I have educated him that once pt enters hospital that the protocol is for hospital to make referral to the agency they choose.   Please reach out to [place] if there is anything we can do to help with d/c plan and hospice services.   Webb Silversmith RN [phone]

## 2021-03-05 NOTE — Assessment & Plan Note (Addendum)
Follow urine culture.  She has foley catheter.  Discussed with Radiation oncology PA Ebony Hail, if patient decide to proceed with Hospice care, consider keeping foley catheter.

## 2021-03-05 NOTE — Assessment & Plan Note (Addendum)
-  CT abdomen and pelvis: No evidence of intestinal obstruction or pneumoperitoneum.  No hydronephrosis.  Small to moderate bilateral pleural effusion.  Patchy infiltrates both lower lung fields.  Extensive skeletal metastatic disease.  There is abnormal patchy enhancement in multiple muscles suggesting possible active inflammatory or neoplastic process. -Continue with laxatives.  -Added PPI to cover for gastritis, she has been on dexamethasone.  -Continue with oral antibiotics to cover for UTI.

## 2021-03-05 NOTE — Assessment & Plan Note (Addendum)
Patient received radiation treatment last admission. She follows with Dr. Chryl Heck  with oncology. Per Dr Chryl Heck, patient with poor prognosis less than 5-6 months.  Palliative care consulted.  Discussed with Ebony Hail with radiation oncology, patient received palliative radiation. Palliative care consulted.

## 2021-03-06 ENCOUNTER — Ambulatory Visit: Payer: Medicare Other

## 2021-03-06 ENCOUNTER — Telehealth: Payer: Self-pay | Admitting: *Deleted

## 2021-03-06 DIAGNOSIS — Z978 Presence of other specified devices: Secondary | ICD-10-CM

## 2021-03-06 DIAGNOSIS — L8990 Pressure ulcer of unspecified site, unspecified stage: Secondary | ICD-10-CM | POA: Insufficient documentation

## 2021-03-06 LAB — URINE CULTURE: Culture: 70000 — AB

## 2021-03-06 LAB — BASIC METABOLIC PANEL
Anion gap: 9 (ref 5–15)
BUN: 14 mg/dL (ref 8–23)
CO2: 23 mmol/L (ref 22–32)
Calcium: 8 mg/dL — ABNORMAL LOW (ref 8.9–10.3)
Chloride: 96 mmol/L — ABNORMAL LOW (ref 98–111)
Creatinine, Ser: 0.53 mg/dL (ref 0.44–1.00)
GFR, Estimated: >60 mL/min (ref >60)
Glucose, Bld: 131 mg/dL — ABNORMAL HIGH (ref 70–99)
Potassium: 3.7 mmol/L (ref 3.5–5.1)
Sodium: 128 mmol/L — ABNORMAL LOW (ref 135–145)

## 2021-03-06 LAB — OSMOLALITY: Osmolality: 281 mOsm/kg (ref 275–295)

## 2021-03-06 MED ORDER — DEXAMETHASONE 4 MG PO TABS
2.0000 mg | ORAL_TABLET | Freq: Two times a day (BID) | ORAL | Status: DC
  Administered 2021-03-06 – 2021-03-10 (×8): 2 mg via ORAL
  Filled 2021-03-06 (×8): qty 1

## 2021-03-06 NOTE — Progress Notes (Signed)
I spoke with Dr. Tyrell Antonio yesterday and we will plan to defer foley management to hospice care. I decreased the steroid dose to dexamethasone 2 mg BID. Her dose can be decreased to 2 mg daily next week if she does not develop progressive weakness, and then every other day the following week again if tolerated for a week and a half prior to discontinuing. We will cancel our visit originally scheduled for tomorrow due to inpt status.

## 2021-03-06 NOTE — Progress Notes (Signed)
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  Patient initially seen in the ED 2 days ago for similar complaints of abdominal pain.  CT was pursued and shows significant stool burden/constipation.  Patient received laxative and she had a large bowel movement with subsequently discharged home on MiraLAX.  She presents with recurrence of abdominal pain, no further bowel movement since discharge form ED. he reports abdominal pain as severe, accompanied by nausea and decreased oral intake.  Patient was found to have hyponatremia with a sodium level of 125, UA with small leukocytes.  CT abdomen and pelvis repeated showed no obstruction or free air, no hydronephrosis, bilateral pleural effusion consistent with associated atelectasis versus pneumonia.  Bony mets also noted.    Assessment and Plan: * Hyponatremia- (present on admission) Likely secondary to poor oral intake, hypovolemia. Improved initially  with IV fluids.  Presents with a sodium level of 125. Sodium level improved to 132.-- down to 128 today. Check urine osmolality.  Continue with IV fluids.   Foley catheter in place See pyuria A and P.   Pyuria Follow urine culture.  She has foley catheter.  Discussed with Radiation oncology PA Leslie [name], if patient decide to proceed with Hospice care, consider keeping foley catheter.   Abdominal pain -CT abdomen and pelvis: No evidence of intestinal obstruction or pneumoperitoneum.  No hydronephrosis.  Small to moderate bilateral pleural effusion.   Patchy infiltrates both lower lung fields.  Extensive skeletal metastatic disease.  There is abnormal patchy enhancement in multiple muscles suggesting possible active inflammatory or neoplastic process. -Continue with laxatives.  -Added PPI to cover for gastritis, she has been on dexamethasone.  -Continue with oral antibiotics to cover for UTI.   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 She follows with Dr. Chryl [name]  with oncology. Per Dr Leslie [name], patient with poor prognosis less than 5-6 months.  Palliative care consulted.  Discussed with Leslie [name] with radiation oncology, patient received palliative radiation. Palliative care consulted.    Malignant neoplasm of upper-outer quadrant of left breast in female, estrogen receptor positive ([name]) -She has estrogen receptor positive breast cancer 2010, metastatic disease to bone 2021, present admission 01/2021 for gout occasional compression, leptomeningeal involvement, underwent craniospinal radiation last admission. -Prognosis is possible less than 6 months.   -Continue with anastrozole, Decadron. -Continue with oxycodone.          Subjective:  She report some improvement of abdominal pain. She had BM. She report supra pubic and epigastric pain.     Physical Exam: Vitals:   03/05/21 1754 03/05/21 2148 03/06/21 0130 03/06/21 1224  BP: 138/72 134/74 140/71 119/63  Pulse: 73 74 75 80  Resp: 20 18 18 18   Temp: 98.1 F (36.7 C) 99.6 F (37.6 C) 99.1 F (37.3 C) 97.9 F (36.6 C)  TempSrc: Oral Oral Oral Oral  SpO2: 94% 94% 90% 93%  Weight:      Height:       General; NAD Lungs; CTA Abdomen; BS  present, soft nt  Data Reviewed:  Prior records, CBC and Bmet reviewed.   Family Communication: care discussed  with patient.   Disposition: Status is: inpatient. Awaiting palliative care consult.        Planned Discharge Destination: Home     Time spent: 45  minutes  Author: Elmarie Shiley, MD 03/06/2021 3:15 PM  For on call review [email or web address].

## 2021-03-06 NOTE — Telephone Encounter (Signed)
Spoke with the patient's son and let him know that the PA has spoken with the hospitalist to let them know that we were planning for voiding trial to try and remove her urinary catheter.  We would also like to decrease her steroid dose to 2mg  BID.  She was scheduled for an appointment here in our clinic tomorrow and we have canceled this due to her being inpatient.  We have also placed hospice/palliative referral for his mom to be seen while in the hospital. He verbalized understanding.  He was very appreciative of the call and will reach out if he has further questions.  Gloriajean Dell. Leonie Green, BSN

## 2021-03-06 NOTE — Plan of Care (Signed)
  Problem: Nutrition: Goal: Adequate nutrition will be maintained Outcome: Progressing   Problem: Coping: Goal: Level of anxiety will decrease Outcome: Progressing   Problem: Pain Managment: Goal: General experience of comfort will improve Outcome: Progressing   Problem: Safety: Goal: Ability to remain free from injury will improve Outcome: Progressing   

## 2021-03-06 NOTE — Assessment & Plan Note (Signed)
See pyuria A and P.

## 2021-03-06 NOTE — Plan of Care (Signed)

## 2021-03-06 NOTE — Progress Notes (Signed)
Initial Nutrition Assessment  DOCUMENTATION CODES:   Severe malnutrition in context of chronic illness, Underweight  INTERVENTION:  - Encourage PO intake - Ensure Enlive po TID, each supplement provides 350 kcal and 20 grams of protein. - Daily snacks TID to optimize nutritional intake - Continue MVI daily  NUTRITION DIAGNOSIS:   Severe Malnutrition related to chronic illness (breast cancer with bony mets, leptomeningeal disease) as evidenced by severe fat depletion, severe muscle depletion.  GOAL:   Patient will meet greater than or equal to 90% of their needs  MONITOR:   PO intake, Supplement acceptance, Labs, Weight trends, Skin  REASON FOR ASSESSMENT:   Consult, Malnutrition Screening Tool Assessment of nutrition requirement/status  ASSESSMENT:   Pt admitted from home with abdominal pain of unknown etiology. 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 No current plan for intensive chemotherapy. Noted outpatient hospice was supposed to have meeting with pt, however pt ended up admitted to hospital prior to planned meeting.  Pt reports poor appetite and minimal PO intake for about 2-3 weeks. She endorses nausea, improved stomach pain and early satiety. Spoke with pt about eating after taking nausea medication to help improve appetite and PO intake. Pt reports drinking Ensure at least 3 times per day at home. Will continue to order during admission. At home she is mobile and able to walk without assistance. We discussed eating throughout the day to optimize nutrition. She would like snacks between meals to encourage adequate intake.  She is unsure of her usual and current weight but suspects that she had lost about 15 lbs within the past 3 weeks. Unable to confirm this weight loss  however noted a steady weight loss within the last year.  Medications: anastrozole, keflex, Vitamin D3, decadron, colace, foliv avid, remeron, MVI, protonix, senna, Vitamin E  Labs: sodium 128, corrected calcium 8.8,   NUTRITION - FOCUSED PHYSICAL EXAM:  Flowsheet Row Most Recent Value  Orbital Region Severe depletion  Upper Arm Region Severe depletion  Thoracic and Lumbar Region Moderate depletion  Buccal Region Severe depletion  Temple Region Moderate depletion  Clavicle Bone Region Severe depletion  Clavicle and Acromion Bone Region Severe depletion  Scapular Bone Region Severe depletion  Dorsal Hand Severe depletion  Patellar Region Severe depletion  Anterior Thigh Region Severe depletion  Posterior Calf Region Severe depletion  Edema (RD Assessment) None  Hair Reviewed  Eyes Reviewed  Mouth Reviewed  Skin Reviewed  Nails Reviewed       Diet Order:   Diet Order             Diet regular Room service appropriate? Yes; Fluid consistency: Thin  Diet effective now                   EDUCATION NEEDS:   Education needs have been addressed  Skin:  Skin Assessment: Skin Integrity Issues: Skin Integrity Issues:: Stage III Stage III: coccyx  Last BM:  2/14 (type 6)  Height:   Ht Readings from Last 1 Encounters:  03/05/21 5\' 4"  (1.626 m)    Weight:   Wt Readings from Last 1 Encounters:  03/05/21 46 kg     BMI:  Body mass index is 17.41 kg/m.  Estimated Nutritional Needs:   Kcal:     Protein:     Fluid:  Clayborne Dana, RDN, LDN Clinical Nutrition

## 2021-03-07 ENCOUNTER — Encounter (HOSPITAL_COMMUNITY): Payer: Self-pay | Admitting: Internal Medicine

## 2021-03-07 ENCOUNTER — Ambulatory Visit: Payer: Medicare Other

## 2021-03-07 ENCOUNTER — Ambulatory Visit: Payer: Medicare Other | Admitting: Radiation Oncology

## 2021-03-07 DIAGNOSIS — Z7189 Other specified counseling: Secondary | ICD-10-CM

## 2021-03-07 DIAGNOSIS — Z515 Encounter for palliative care: Secondary | ICD-10-CM

## 2021-03-07 LAB — OSMOLALITY, URINE: Osmolality, Ur: 306 mOsm/kg (ref 300–900)

## 2021-03-07 LAB — SODIUM, URINE, RANDOM: Sodium, Ur: 77 mmol/L

## 2021-03-07 MED ORDER — TAMSULOSIN HCL 0.4 MG PO CAPS
0.4000 mg | ORAL_CAPSULE | Freq: Every day | ORAL | Status: DC
  Administered 2021-03-07 – 2021-03-08 (×2): 0.4 mg via ORAL
  Filled 2021-03-07 (×2): qty 1

## 2021-03-07 NOTE — Progress Notes (Signed)
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  Patient initially seen in the ED 2 days ago for similar complaints of abdominal pain.  CT was pursued and shows significant stool burden/constipation.  Patient received laxative and she had a large bowel movement with subsequently discharged home on MiraLAX.  She presents with recurrence of abdominal pain, no further bowel movement since discharge form ED. he reports abdominal pain as severe, accompanied by nausea and decreased oral intake.  Patient was found to have hyponatremia with a sodium level of 125, UA with small leukocytes.  CT abdomen and pelvis repeated showed no obstruction or free air, no hydronephrosis, bilateral pleural effusion consistent with associated atelectasis versus pneumonia.  Bony mets also noted.    Assessment and Plan:  * Hyponatremia- (present on admission) -Patient presented to the ED due to abnormal pain initially and found to have hyponatremia Hyponatremia likely secondary to poor oral intake, hypovolemia. 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  She has foley catheter.  Discussed with Radiation oncology PA Ebony Hail, if patient decide to proceed with Hospice care, consider keeping foley catheter.   Abdominal pain, likely from constipation, resolved after having bowel movement -CT abdomen and pelvis: No evidence of intestinal obstruction or pneumoperitoneum.  No hydronephrosis.  Small to moderate bilateral pleural effusion.  Patchy infiltrates both lower lung fields.  Extensive skeletal metastatic disease.  There is abnormal patchy enhancement in multiple muscles suggesting possible active inflammatory or neoplastic process. -Continue with laxatives.  -Added PPI to cover for gastritis, she has been on dexamethasone.  -Continue with oral antibiotics to cover for UTI.   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 She follows with Dr. Chryl Heck  with oncology. Per Dr Chryl Heck, patient with poor prognosis less than 5-6 months.  Discussed with Ebony Hail with radiation oncology who states not be able to give more radiation at this point  Palliative care consulted.    Malignant neoplasm of upper-outer quadrant of left breast in female, estrogen receptor positive ([place]) -She has estrogen receptor positive breast cancer 2010, metastatic disease to bone 2021, present admission 01/2021 for gout occasional compression, leptomeningeal involvement, underwent craniospinal radiation last admission. -Prognosis is possible less than 6 months.   -Continue with anastrozole, Decadron. -Continue with oxycodone.  -Continue goals of care discussion  FTT, continue goals of care discussion, disposition unclear at this moment, as she lives by herself prior to admission         Subjective:  She appears very weak, but AAOx3, denies Abdo pain today She desires to have Foley removed  today  Physical Exam: Vitals:   03/06/21 0130 03/06/21 1224 03/06/21 1936 03/07/21 0414  BP: 140/71 119/63 130/67 120/71  Pulse: 75 80 88 79  Resp: 18 18 20 18   Temp: 99.1 F (37.3 C) 97.9 F (36.6 C) 98.4 F (36.9 C) 98.2 F (36.8 C)  TempSrc: Oral Oral Oral Oral  SpO2: 90% 93% 97% 95%  Weight:      Height:       General; weak, AAOx3 Lungs; CTA Abdomen; BS  present, soft nt  Data Reviewed:  Prior records, CBC and Bmet reviewed.   Family Communication: care discussed with patient.   Disposition: Status is: inpatient. Awaiting palliative care consult.        Planned Discharge Destination: Home     Time spent: 45 minutes  Author: Florencia Reasons, MD PhD FACP 03/07/2021 6:44 PM  For on call review [email or web address].

## 2021-03-07 NOTE — Evaluation (Signed)
Physical Therapy Evaluation Patient Details Name: Leslie [name] MRN: [id number] DOB: [birth year] Today's Date: 03/07/2021  History of Present Illness  Leslie [name] is a 76 y.o. female presnets with lower abdominal pain. Pt admitted 2/12 with hyponatremia. CT abdomen and pelvis: There is no evidence of intestinal obstruction or pneumoperitoneum. 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 Pt from home alone, has aide that assists with bathing and dressing daily each morning, ambulates around home with RW and denies falls, has family next door who assists with grocery shopping and household chores. Pt currently requiring mod A to power up to stand and limited to a few steps at bedside. Pt does report she was up in recliner for 2 hours and just got back into bed so she is very fatigued. Pt currently with functional limitations due to the deficits listed below (see PT Problem List). Pt will benefit from skilled PT to increase their independence and safety with mobility to allow discharge to the venue listed below.          Recommendations for follow up therapy are one component of a multi-disciplinary discharge planning process, led by the attending physician.  Recommendations may be updated based on patient status, additional functional criteria and insurance authorization.  Follow Up Recommendations Home health PT    Assistance Recommended at Discharge Frequent or constant Supervision/Assistance  Patient can return home with the following  A lot of help with walking and/or transfers;A lot of help with bathing/dressing/bathroom;Assistance with cooking/housework;Assist for transportation;Help with stairs or ramp for entrance    Equipment Recommendations None recommended by PT  Recommendations for Other Services       Functional Status Assessment Patient has had a recent decline in their functional status and  demonstrates the ability to make significant improvements in function in a reasonable and predictable amount of time.     Precautions / Restrictions Precautions Precautions: Fall Restrictions Weight Bearing Restrictions: No      Mobility  Bed Mobility Overal bed mobility: Needs Assistance Bed Mobility: Supine to Sit  Supine to sit: Min guard  General bed mobility comments: increased time, use of bedrail to mobilize to EOB    Transfers Overall transfer level: Needs assistance Equipment used: Rolling walker (2 wheels) Transfers: Sit to/from Stand Sit to Stand: Mod assist  General transfer comment: pt requires mod A to power to stand, maintains narrow BOS, able to take 3 steps up to [facility] with mod A to steady, maintains weight posterior, too fatigued to ambulate away from EOB    Ambulation/Gait   Stairs            Wheelchair Mobility    Modified Rankin (Stroke Patients Only)       Balance Overall balance assessment: Needs assistance Sitting-balance support: Feet supported Sitting balance-Leahy Scale: Good  Standing balance support: During functional activity, Bilateral upper extremity supported, Reliant on assistive device for balance Standing balance-Leahy Scale: Poor       Pertinent Vitals/Pain Pain Assessment Pain Assessment: No/denies pain    Home Living Family/patient expects to be discharged to:: Private residence Living Arrangements: Alone Available Help at Discharge: Family;Personal care attendant (niece next door) Type of Home: House Home Access: Stairs to enter Entrance Stairs-Rails: None Entrance Stairs-Number of Steps: 2   Home Layout: One level Home Equipment: Cane - single Barista (2 wheels) Additional Comments: pt reports aide comes every morning at 9am to assist  with bathing and dressing then leaves    Prior Function Prior Level of Function : Independent/Modified Independent  Mobility Comments: pt reports using RW around the  home, denies falls ADLs Comments: pt reports aide assists with bath and dressing daily, sister completes household chores for pt     Hand Dominance   Dominant Hand: Right    Extremity/Trunk Assessment   Upper Extremity Assessment Upper Extremity Assessment: Overall WFL for tasks assessed    Lower Extremity Assessment Lower Extremity Assessment: Generalized weakness (AROM WNL, strength grossly 3+/5, reports some numbness/tingling in bil feet all the time)    Cervical / Trunk Assessment Cervical / Trunk Assessment: Normal  Communication   Communication: No difficulties  Cognition Arousal/Alertness: Awake/alert Behavior During Therapy: WFL for tasks assessed/performed Overall Cognitive Status: Within Functional Limits for tasks assessed     General Comments      Exercises     Assessment/Plan    PT Assessment Patient needs continued PT services  PT Problem List Decreased strength;Decreased activity tolerance;Decreased balance;Decreased mobility;Decreased knowledge of use of DME;Decreased safety awareness       PT Treatment Interventions DME instruction;Gait training;Stair training;Functional mobility training;Therapeutic activities;Therapeutic exercise;Balance training;Patient/family education    PT Goals (Current goals can be found in the Care Plan section)  Acute Rehab PT Goals Patient Stated Goal: home with family to support PT Goal Formulation: With patient Time For Goal Achievement: 03/21/21 Potential to Achieve Goals: Good    Frequency Min 3X/week     Co-evaluation               AM-PAC PT "6 Clicks" Mobility  Outcome Measure Help needed turning from your back to your side while in a flat bed without using bedrails?: A Little Help needed moving from lying on your back to sitting on the side of a flat bed without using bedrails?: A Little Help needed moving to and from a bed to a chair (including a wheelchair)?: A Little Help needed standing up from a  chair using your arms (e.g., wheelchair or bedside chair)?: A Little Help needed to walk in hospital room?: A Lot Help needed climbing 3-5 steps with a railing? : Total 6 Click Score: 15    End of Session Equipment Utilized During Treatment: Gait belt Activity Tolerance: Patient limited by fatigue Patient left: in bed;with call bell/phone within reach;with bed alarm set Nurse Communication: Mobility status PT Visit Diagnosis: Unsteadiness on feet (R26.81);Other abnormalities of gait and mobility (R26.89);Muscle weakness (generalized) (M62.81)    Time: 0350-0938 PT Time Calculation (min) (ACUTE ONLY): 14 min   Charges:   PT Evaluation $PT Eval Low Complexity: 1 Low           Tori Maretta Overdorf PT, DPT 03/07/21, 1:06 PM

## 2021-03-07 NOTE — Consult Note (Signed)
Consultation Note Date: 03/07/2021   Patient Name: Leslie [name]  DOB: [birth year]  MRN: [id number]  Age / Sex: 76 y.o., female  PCP: Mayra Neer, MD Referring Physician: Florencia Reasons, MD  Reason for Consultation: Establishing goals of care  HPI/Patient Profile: 76 y.o. 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  Patient initially seen in the ED 2 days ago for similar complaints of abdominal pain.  CT was pursued and shows significant stool burden/constipation.  Patient received laxative and she had a large bowel movement with subsequently discharged home on MiraLAX.   She presents with recurrence of abdominal pain, no further bowel movement since discharge form ED. he reports abdominal pain as severe, accompanied by nausea and decreased oral intake.   Patient was found to have hyponatremia with a sodium level of 125, UA with small leukocytes.  CT abdomen and pelvis repeated showed no obstruction or free air, no hydronephrosis, bilateral pleural effusion consistent with associated atelectasis versus pneumonia.  Bony mets also noted.   Clinical Assessment and Goals of Care:  Patient admitted with hyponatremia, pyuria abdominal pain. A palliative consult has been requested for ongoing goals of care discussions.   The patient has been seen and evaluated previously by palliative service.  Patient is awake alert resting in bed.  She states that she participated some with physical therapy.  She states that she feels stronger than when she came in.  I discussed with her about her current condition.  Broad goals of  care discussions also undertaken.  Goals wishes and values attempted to be explored.  NEXT OF KIN  Son   SUMMARY OF RECOMMENDATIONS    DNR Goals of care and disposition options discussions held with the patient: She states that she participated well with PT, she is considering home with PT and outpatient palliative support. She also wishes to follow up with Dr Lisbeth Renshaw as well. I discussed with her about seeking hospice support after PT efforts are done and after she has followed up with Dr Lisbeth Renshaw.  Continue current mode of care. Continue goals of care discussions with the patient in order to better outline her broad goals and wishes for herself.  Thank you for the consult. PMT to follow along.   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 The following aspects are pertinent.  Past Medical History:  Diagnosis Date   Benign breast  cyst in female    Right breast   Cancer of left breast ([place]) 2010   Depression    History of blood transfusion    "related to tubal pregnancy"   Hypertension    PAD (peripheral artery disease) ([place])    Social History   Socioeconomic History   Marital status: Widowed    Spouse name: Not on file   Number of children: Not on file   Years of education: Not on file   Highest education level: Not on file  Occupational History   Not on file  Tobacco Use   Smoking status: Every Day    Packs/day: 0.50    Years: 45.00    Pack years: 22.50    Types: Cigarettes   Smokeless  tobacco: Never  Vaping Use   Vaping Use: Never used  Substance and Sexual Activity   Alcohol use: Yes    Comment: 06/19/2017 "glass of wine 3-4 times/year"   Drug use: Never   Sexual activity: Not Currently    Birth control/protection: Post-menopausal, Surgical  Other Topics Concern   Not on file  Social History Narrative   Not on file   Social Determinants of Health   Financial Resource Strain: Not on file  Food Insecurity: Not on file  Transportation Needs: Not on file  Physical Activity: Not on file  Stress: Not on file  Social Connections: Not on file   Family History  Problem Relation Age of Onset   Hypertension Mother    Seizures Mother    Heart disease Father    Hypertension Sister    Scheduled Meds:  anastrozole  1 mg Oral Daily   cephALEXin  250 mg Oral Q8H   Chlorhexidine Gluconate Cloth  6 each Topical Daily   cholecalciferol  400 Units Oral Daily   dexamethasone  2 mg Oral Q12H   docusate sodium  100 mg Oral BID   enoxaparin (LOVENOX) injection  30 mg Subcutaneous Q24H   feeding supplement  237 mL Oral BID BM   folic acid  2,542 mcg Oral Daily   lidocaine  1 patch Transdermal Daily   mirtazapine  7.5 mg Oral QHS   montelukast  10 mg Oral QHS   multivitamin with minerals   Oral Q breakfast   pantoprazole  40 mg Oral BID   polyethylene glycol  17 g Oral BID   senna-docusate  1 tablet Oral BID   sodium chloride flush  3 mL Intravenous Q12H   vitamin E  400 Units Oral Daily   Continuous Infusions:  sodium chloride 100 mL/hr at 03/06/21 2122   PRN Meds:.acetaminophen **OR** acetaminophen, fluocinonide cream, magnesium hydroxide, ondansetron **OR** ondansetron (ZOFRAN) IV, oxyCODONE-acetaminophen, sodium phosphate, sorbitol, triamcinolone cream Medications Prior to Admission:  Prior to Admission medications   Medication Sig Start Date End Date Taking? Authorizing Provider  anastrozole (ARIMIDEX) 1 MG tablet Take 1 tablet (1 mg total) by mouth daily.  12/12/20  Yes Magrinat, Virgie Dad, MD  Cholecalciferol (VITAMIN D3) 10 MCG (400 UNIT) CAPS Take 400 Units by mouth daily with breakfast.   Yes [provider]  dexamethasone (DECADRON) 4 MG tablet Take 1 tablet (4 mg total) by mouth every 12 (twelve) hours. 02/28/21  Yes Iruku, Arletha Pili, MD  Evening Primrose Oil 1000 MG CAPS Take 1,000 mg by mouth daily.   Yes [provider]  feeding supplement (ENSURE ENLIVE / ENSURE PLUS) LIQD Take 237 mLs by mouth 2 (two) times daily between meals. 02/19/21  Yes [place] Pink,  Trenton Gammon, MD  fluocinonide cream (LIDEX) 5.62 % Apply 1 application topically 2 (two) times daily as needed (for irritation- affected areas).   Yes [provider]  folic acid (FOLVITE) 563 MCG tablet Take 400 mcg by mouth daily.    Yes [provider]  lidocaine (LIDODERM) 5 % Place 1 patch onto the skin See admin instructions. Apply 1 patch to the lower back once a day and remove & discard the patch within 12 hours or as directed by MD 11/09/20  Yes Magrinat, Virgie Dad, MD  mirtazapine (REMERON) 7.5 MG tablet Take 7.5 mg by mouth at bedtime.   Yes [provider]  montelukast (SINGULAIR) 10 MG tablet Take 10 mg by mouth at bedtime.   Yes [provider]  Multiple Vitamin (MULTIVITAMIN PO) Take 1 tablet by mouth daily with breakfast.   Yes [provider]  nitrofurantoin, macrocrystal-monohydrate, (MACROBID) 100 MG capsule Take 1 capsule (100 mg total) by mouth at bedtime. 02/19/21  Yes Annita Brod, MD  ondansetron (ZOFRAN) 8 MG tablet Take 8 mg by mouth every 8 (eight) hours as needed for nausea or vomiting. 11/09/20  Yes Magrinat, Virgie Dad, MD  oxyCODONE-acetaminophen (PERCOCET/ROXICET) 5-325 MG tablet Take 1 tablet by mouth every 6 (six) hours as needed for severe pain. 02/19/21  Yes Annita Brod, MD  triamcinolone cream (KENALOG) 0.1 % Apply 1 application topically daily as needed (for itching- affectes sites). 05/07/19  Yes  [provider]  vitamin E 400 UNIT capsule Take 400 Units by mouth daily.   Yes [provider]   Allergies  Allergen Reactions   Aspirin Nausea And Vomiting   Cetirizine Other (See Comments)    Chest Pain    Rosuvastatin Other (See Comments)    MD suspended this due to bone pain   Review of Systems + weakness.   Physical Exam Awake alert Resting in bed Generalized weakness Regular work of breathing S 1 S 2 Abdomen not tender  Vital Signs: BP 120/71 (BP Location: Right Arm)    Pulse 79    Temp 98.2 F (36.8 C) (Oral)    Resp 18    Ht 5\' 4"  (1.626 m)    Wt 46 kg    SpO2 95%    BMI 17.41 kg/m  Pain Scale: 0-10   Pain Score: 0-No pain   SpO2: SpO2: 95 % O2 Device:SpO2: 95 % O2 Flow Rate: .   IO: Intake/output summary:  Intake/Output Summary (Last 24 hours) at 03/07/2021 1309 Last data filed at 03/07/2021 8937 Gross per 24 hour  Intake 236 ml  Output 1400 ml  Net -1164 ml    LBM: Last BM Date : 03/06/21 Baseline Weight: Weight: 46 kg Most recent weight: Weight: 46 kg     Palliative Assessment/Data:   PPS 50%  Time In:  1300 Time Out:  1400 Time Total:  60  Greater than 50%  of this time was spent counseling and coordinating care related to the above assessment and plan.  Signed by: Loistine Chance, MD   Please contact Palliative Medicine Team phone at [phone] for questions and concerns.  For individual provider: See Shea Evans

## 2021-03-08 ENCOUNTER — Inpatient Hospital Stay: Payer: Medicare Other | Admitting: Hematology and Oncology

## 2021-03-08 ENCOUNTER — Encounter: Payer: Self-pay | Admitting: Radiation Oncology

## 2021-03-08 ENCOUNTER — Ambulatory Visit: Payer: Medicare Other

## 2021-03-08 DIAGNOSIS — E871 Hypo-osmolality and hyponatremia: Secondary | ICD-10-CM | POA: Diagnosis not present

## 2021-03-08 LAB — BASIC METABOLIC PANEL
Anion gap: 7 (ref 5–15)
BUN: 10 mg/dL (ref 8–23)
CO2: 22 mmol/L (ref 22–32)
Calcium: 7.9 mg/dL — ABNORMAL LOW (ref 8.9–10.3)
Chloride: 105 mmol/L (ref 98–111)
Creatinine, Ser: 0.55 mg/dL (ref 0.44–1.00)
GFR, Estimated: >60 mL/min (ref >60)
Glucose, Bld: 132 mg/dL — ABNORMAL HIGH (ref 70–99)
Potassium: 3.5 mmol/L (ref 3.5–5.1)
Sodium: 134 mmol/L — ABNORMAL LOW (ref 135–145)

## 2021-03-08 LAB — MAGNESIUM: Magnesium: 2 mg/dL (ref 1.7–2.4)

## 2021-03-08 NOTE — Progress Notes (Signed)
Daily Progress Note   Patient Name: Leslie [name]       Date: 03/08/2021 DOB: 09-19-1945  Age: 76 y.o. MRN#: [id number] Attending Physician: Florencia Reasons, MD Primary Care Physician: Mayra Neer, MD Admit Date: 03/04/2021  Reason for Consultation/Follow-up: Establishing goals of care  Subjective:  Resting in bed, son [name] arrived at bedside during my visit.  "If I am not going to have more treatments, then what am I going to do?"  Length of Stay: 3  Current Medications: Scheduled Meds:   anastrozole  1 mg Oral Daily   cephALEXin  250 mg Oral Q8H   Chlorhexidine Gluconate Cloth  6 each Topical Daily   cholecalciferol  400 Units Oral Daily   dexamethasone  2 mg Oral Q12H   docusate sodium  100 mg Oral BID   enoxaparin (LOVENOX) injection  30 mg Subcutaneous Q24H   feeding supplement  237 mL Oral BID BM   folic acid  9,794 mcg Oral Daily   lidocaine  1 patch Transdermal Daily   mirtazapine  7.5 mg Oral QHS   montelukast  10 mg Oral QHS   multivitamin with minerals   Oral Q breakfast   pantoprazole  40 mg Oral BID   polyethylene glycol  17 g Oral BID   senna-docusate  1 tablet Oral BID   sodium chloride flush  3 mL Intravenous Q12H   tamsulosin  0.4 mg Oral QPC breakfast   vitamin E  400 Units Oral Daily    Continuous Infusions:  sodium chloride 100 mL/hr at 03/07/21 1853    PRN Meds: acetaminophen **OR** acetaminophen, fluocinonide cream, magnesium hydroxide, ondansetron **OR** ondansetron (ZOFRAN) IV, oxyCODONE-acetaminophen, sodium phosphate, sorbitol, triamcinolone cream  Physical Exam         Awake alert Oriented Clear breath sounds S 1 S 2  Abdomen not distended No edema  Vital Signs: BP 117/63 (BP Location: Right Arm)    Pulse 75    Temp 98.7 F (37.1 C)  (Oral)    Resp 14    Ht 5\' 4"  (1.626 m)    Wt 46 kg    SpO2 97%    BMI 17.41 kg/m  SpO2: SpO2: 97 % O2 Device: O2 Device: Room Air O2 Flow Rate:    Intake/output summary:  Intake/Output Summary (Last 24 hours) at  03/08/2021 1220 Last data filed at 03/08/2021 0520 Gross per 24 hour  Intake 1362.91 ml  Output 2650 ml  Net -1287.09 ml   LBM: Last BM Date : 03/06/21 Baseline Weight: Weight: 46 kg Most recent weight: Weight: 46 kg       Palliative Assessment/Data:      Patient Active Problem List   Diagnosis Date Noted   Pressure injury of skin 03/06/2021   Foley catheter in place 03/06/2021   Pyuria 03/05/2021   Acute hyponatremia 03/05/2021   Hyponatremia 03/04/2021   Abdominal pain 03/04/2021   Leukocytosis 02/14/2021   Constipation 02/10/2021   Leptomeningeal disease    Metastatic malignant neoplasm ([place])    Protein-calorie malnutrition, severe 02/08/2021   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[place]) 02/07/2021   Acute urinary retention 02/07/2021   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[facility])    Spinal cord lesion ([facility])    Malignant neoplasm of upper-outer quadrant of left breast in female, estrogen receptor positive ([place]) 11/05/2019   Bone metastases ([place]) 10/29/2019   Hyperlipidemia 07/08/2017   Claudication in peripheral vascular disease ([place]) 06/19/2017   Essential hypertension 06/04/2017   Peripheral arterial disease ([place]) 06/04/2017   Tobacco abuse 06/04/2017   Hot flashes 11/26/2010    Palliative Care Assessment & Plan   Patient Profile:  76 year old past medical history significant for hypertension, hyperlipidemia, PAD, 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  Patient initially seen in the ED 2 days ago for similar complaints of abdominal pain.  CT was pursued and shows significant  stool burden/constipation.  Patient received laxative and she had a large bowel movement with subsequently discharged home on MiraLAX.   She presents with recurrence of abdominal pain, no further bowel movement since discharge form ED. he reports abdominal pain as severe, accompanied by nausea and decreased oral intake.   Patient was found to have hyponatremia with a sodium level of 125, UA with small leukocytes.  CT abdomen and pelvis repeated showed no obstruction or free air, no hydronephrosis, bilateral pleural effusion consistent with associated atelectasis versus pneumonia.  Bony mets also noted  Assessment:  Hyponatremia improved since admission, patient feeling better than when she came in.  Ongoing goals of care discussions.   Recommendations/Plan:  Broad goals of care discussions undertaken with patient as well as her son [name] who arrived at bedside. Reviewed pertinent information from her medical chart. Differences between hospice and palliative care were explained. Disposition options also explored.  Patient wishes to go back home towards the end of this hospitalization, however, she states she is still seeking more clarification regarding her cancer directed care. Discussed with her, to the best of my ability about life limiting condition of leptomeningeal metastases in the setting of breast cancer and what recommendations have  been put forth by her cancer treatment team.  Appreciate multi disciplinary support from oncology and radiation-oncology colleagues. PMT to continue to follow and help support the patient's goals for her care.    Code Status:    Code Status Orders  (From admission, onward)           Start     Ordered   03/04/21 2032  Do not attempt resuscitation (DNR)  Continuous       Question Answer Comment  In the event of cardiac or respiratory ARREST Do not call a code blue   In the event of  cardiac or respiratory ARREST Do not perform Intubation, CPR,  defibrillation or ACLS   In the event of cardiac or respiratory ARREST Use medication by any route, position, wound care, and other measures to relive pain and suffering. May use oxygen, suction and manual treatment of airway obstruction as needed for comfort.      03/04/21 2032           Code Status History     Date Active Date Inactive Code Status Order ID Comments User Context   03/04/2021 1824 03/04/2021 2032 Full Code [id number]  Marcelyn Bruins, MD ED   02/07/2021 1836 02/19/2021 2214 DNR [id number]  Eugenie Filler, MD ED   06/19/2017 1704 06/20/2017 1428 Full Code [id number]  Lorretta Harp, MD Inpatient      Advance Directive Documentation    Flowsheet Row Most Recent Value  Type of Advance Directive Living will  Pre-existing out of facility DNR order (yellow form or pink MOST form) --  "MOST" Form in Place? --       Prognosis:  Guarded   Discharge Planning: To Be Determined  Care plan was discussed with patient and son, IDT  Thank you for allowing the Palliative Medicine Team to assist in the care of this patient.   Time In: 12 Time Out: 12.35 Total Time 35 Prolonged Time Billed No       Greater than 50%  of this time was spent counseling and coordinating care related to the above assessment and plan.  Loistine Chance, MD  Please contact Palliative Medicine Team phone at [phone] for questions and concerns.

## 2021-03-08 NOTE — Progress Notes (Addendum)
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  She is found to have hyponatremia for which she started IV hydration Abdomen pain has resolved after treating constipation .    Assessment and Plan:  * Hyponatremia- (present on admission) -Patient presented to the ED due to abnormal pain initially and found to have hyponatremia -Hyponatremia likely secondary to poor oral intake, hypovolemia. 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  No hydronephrosis.  Small to moderate bilateral pleural effusion.  Patchy infiltrates both lower lung fields.  Extensive skeletal metastatic disease.  There is abnormal patchy enhancement in multiple muscles suggesting possible active inflammatory or neoplastic process. -Continue with laxatives.  -Added PPI to cover for gastritis, she has been on dexamethasone   Cauda Equina compression/urinary  retention -She received radiation treatment from 1/26 to 2/8, Per radiation oncology ,no more radiation treatment planned -Currently on Decadron 2 mg twice daily -Patient  had a Foley catheter placed on 1/18, failed voiding trial on 2/15, new Foley placed on 2/15 -she Will likely be Foley dependent due to cauda equina compression syndrome, will need to have Foley replaced monthly, patient expressed understanding  Pyuria Foley exchanged  .  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 Chryl Heck  with oncology. Per Dr Chryl Heck, patient with poor prognosis less than 5-6 months.  Discussed with Ebony Hail with radiation oncology who states not be able to give more radiation at this point  Palliative care consulted.     FTT, patient desires to go home with home health, recommend outpatient palliative care to continue follow        Subjective:  She appears very stronger today, she is AAOx3, denies pain today    Physical Exam: Vitals:   03/06/21 1936 03/07/21 0414 03/07/21 2132 03/08/21 0402  BP: 130/67 120/71 118/68 117/63  Pulse: 88 79 77 75  Resp: 20 18 18 14   Temp: 98.4 F (36.9 C) 98.2 F (36.8 C) 98.2 F (36.8 C) 98.7 F (37.1 C)  TempSrc: Oral Oral Oral Oral  SpO2: 97%  95% 96% 97%  Weight:      Height:       General; weak, AAOx3 Lungs; CTA Abdomen; BS  present, soft nt  Data Reviewed:  Prior records, CBC and Bmet  reviewed.   Family Communication: care discussed with patient., and son over the phone  Disposition: Status is: inpatient.    Planned Discharge Destination: Home     Time spent: 45 minutes  Author: Florencia Reasons, MD PhD FACP 03/08/2021 4:38 PM  For on call review [email or web address].

## 2021-03-08 NOTE — Care Management Important Message (Signed)
Important Message  Patient Details IM Letter given to the Patient. Name: Leslie [name] MRN: [id number] Date of Birth: September 30, 1945   Medicare Important Message Given:  Yes     [name] 03/08/2021, 1:10 PM

## 2021-03-08 NOTE — Progress Notes (Signed)
°  Radiation Oncology         [phone] ________________________________  Name: Leslie [name]  AJG:[id number]  Date of Service: 03/08/21  DOB: Feb 14, 1945   Steroid Taper Instructions   You currently have a prescription for Dexamethasone 4 mg Tablets.    Beginning 03/13/21: Take 1/2 of a tablet (which is 2 mg) once a day  Beginning 03/20/21: Take 1/2 of a tablet (which is 2 mg) every other day and stop on 03/30/21.   Please call our office if you develop progressive weakness, numbess, or loss of control of the lower extremities.

## 2021-03-08 NOTE — Progress Notes (Signed)
I spoke with the patient this evening. We discussed that we would not recommend additional radiotherapy. She is considering oral Xeloda, but we reviewed the realistic trajectory of her leptomeningeal disease has on her overall prognosis. I encouraged the patient to continue with plans for outpatient palliative care which she is in agreement with and can be coordinated with [facility] and Palliative Care. They are already aware of her case. She was unable to void on her own this week after her foley was d/c'd so it is now back in place. She is still hopeful to try again. I would think that if she's unable to void on her own if foley was removed again in the next 2-3 weeks, she would likely need to keep this long term, but urology could also weigh in. She is aware of this too after discussing with Dr. Chryl Heck. We reviewed continued slow steroid taper and I've placed a documentation encounter for this that can be printed for the patient so she has instructions at discharge.     Carola Rhine, PAC

## 2021-03-09 ENCOUNTER — Encounter (HOSPITAL_COMMUNITY): Payer: Self-pay | Admitting: Internal Medicine

## 2021-03-09 ENCOUNTER — Encounter: Payer: Self-pay | Admitting: Hematology and Oncology

## 2021-03-09 ENCOUNTER — Ambulatory Visit: Payer: Medicare Other

## 2021-03-09 ENCOUNTER — Other Ambulatory Visit (HOSPITAL_COMMUNITY): Payer: Self-pay

## 2021-03-09 ENCOUNTER — Telehealth: Payer: Self-pay

## 2021-03-09 ENCOUNTER — Other Ambulatory Visit: Payer: Self-pay | Admitting: Hematology and Oncology

## 2021-03-09 ENCOUNTER — Encounter: Payer: Self-pay | Admitting: Oncology

## 2021-03-09 DIAGNOSIS — G96198 Other disorders of meninges, not elsewhere classified: Secondary | ICD-10-CM

## 2021-03-09 DIAGNOSIS — G959 Disease of spinal cord, unspecified: Secondary | ICD-10-CM

## 2021-03-09 DIAGNOSIS — C50412 Malignant neoplasm of upper-outer quadrant of left female breast: Secondary | ICD-10-CM

## 2021-03-09 DIAGNOSIS — Z170 Estrogen receptor positive status [ER+]: Secondary | ICD-10-CM

## 2021-03-09 MED ORDER — CAPECITABINE 500 MG PO TABS
750.0000 mg/m2 | ORAL_TABLET | Freq: Two times a day (BID) | ORAL | 2 refills | Status: AC
  Filled 2021-03-09: qty 56, 14d supply, fill #0
  Filled 2021-03-12: qty 56, 21d supply, fill #0

## 2021-03-09 MED ORDER — ONDANSETRON HCL 8 MG PO TABS: 8.0000 mg | ORAL_TABLET | Freq: Three times a day (TID) | ORAL | 0 refills | Status: AC | PRN

## 2021-03-09 MED ORDER — OXYCODONE-ACETAMINOPHEN 5-325 MG PO TABS: 1.0000 | ORAL_TABLET | Freq: Four times a day (QID) | ORAL | 0 refills | Status: AC | PRN

## 2021-03-09 MED ORDER — POLYETHYLENE GLYCOL 3350 17 G PO PACK: 17.0000 g | PACK | Freq: Two times a day (BID) | ORAL | 0 refills | Status: AC

## 2021-03-09 MED ORDER — PANTOPRAZOLE SODIUM 40 MG PO TBEC: 40.0000 mg | DELAYED_RELEASE_TABLET | Freq: Two times a day (BID) | ORAL | 0 refills | Status: AC

## 2021-03-09 MED ORDER — DEXAMETHASONE 2 MG PO TABS: ORAL_TABLET | ORAL | 0 refills | Status: AC

## 2021-03-09 MED ORDER — SENNOSIDES-DOCUSATE SODIUM 8.6-50 MG PO TABS: 1.0000 | ORAL_TABLET | Freq: Two times a day (BID) | ORAL | Status: AC

## 2021-03-09 MED ORDER — POLYETHYLENE GLYCOL 3350 17 G PO PACK: 17.0000 g | PACK | Freq: Every day | ORAL | Status: DC

## 2021-03-09 NOTE — Clinical Social Work Note (Cosign Needed)
°  °  Durable Medical Equipment  (From admission, onward)           Start     Ordered   03/09/21 1029  For home use only DME Tub bench  Once        03/09/21 1028   03/09/21 0903  For home use only DME Bedside commode  Once       Question:  Patient needs a bedside commode to treat with the following condition  Answer:  FTT (failure to thrive) in adult   03/09/21 0902   03/09/21 0903  For home use only DME Shower stool  Once        03/09/21 0903   03/09/21 0901  For home use only DME Hospital bed  Once       Question Answer Comment  Length of Need Lifetime   Bed type Semi-electric   Support Surface: Low Air loss Mattress      03/09/21 0901

## 2021-03-09 NOTE — Telephone Encounter (Signed)
Oral Oncology Pharmacist Encounter  Received new prescription for capecitabine (Xeloda) for the treatment of metastatic ER positive, PR negative, HER2 negative breast cancer, planned duration until disease progression or unacceptable toxicity. Medication dose and frequency assessed.   Labs from 03/02/21 (CBC) and 03/04/21 (CMP) assessed, no interventions needed.  Current medication list in Epic reviewed, DDIs with Xeloda identified: - mirtazapine (cat B): can cause qtc prolongation, last EKG on 02/10/21 shows QTC 404; normal sinus rhythm.   - folic acid: will monitor for increased toxicities   Evaluated chart and no patient barriers to medication adherence noted.   Patient agreement for treatment documented in MD note on 03/09/21.  Prescription has been e-scribed to the [facility] for benefits analysis and approval.  Oral Oncology Clinic will continue to follow for insurance authorization, copayment issues, initial counseling and start date.  Drema Halon, PharmD Hematology/Oncology Clinical Pharmacist [facility] [phone] 03/09/2021 8:48 AM

## 2021-03-09 NOTE — Progress Notes (Signed)
Physical Therapy Treatment Patient Details Name: Leslie [name] MRN: [id number] DOB: November 02, 1945 Today's Date: 03/09/2021   History of Present Illness BUFFY EHLER is a 76 y.o. female presnets with lower abdominal pain. Pt admitted 2/12 with hyponatremia. CT abdomen and pelvis: There is no evidence of intestinal obstruction or pneumoperitoneum. PMH: PAD, HTN, HLD, breast cancer with mets and leptomeningeal disease    PT Comments    Pt requires min A to power to stand with initial attempt, educated on glute/quad activation and increasing momentum while pushing from seated surface and able to complete following reps with min guard. Pt ambulates 20 ft in room with RW, then able to complete 2nd bout after seated rest. Pt and family in room states pt will have aide present to assist with bathing and dressing, family will be able to assist pt as needed, and family in room reports pt mobilizing "looks normal". Pt tolerates remaining up in recliner at [place].   Recommendations for follow up therapy are one component of a multi-disciplinary discharge planning process, led by the attending physician.  Recommendations may be updated based on patient status, additional functional criteria and insurance authorization.  Follow Up Recommendations  Home health PT     Assistance Recommended at Discharge Frequent or constant Supervision/Assistance  Patient can return home with the following A little help with walking and/or transfers;A little help with bathing/dressing/bathroom;Assistance with cooking/housework;Assist for transportation;Help with stairs or ramp for entrance   Equipment Recommendations  None recommended by PT    Recommendations for Other Services       Precautions / Restrictions Precautions Precautions: Fall Restrictions Weight Bearing Restrictions: No     Mobility  Bed Mobility  General bed mobility comments: in recliner    Transfers Overall transfer level: Needs  assistance Equipment used: Rolling walker (2 wheels) Transfers: Sit to/from Stand Sit to Stand: Min assist, Min guard  General transfer comment: min A initially with pt attempting to pull on RW to power to stand, with 3 follows reps pt pushing from seated surface with education on quad/glute activation and increased speed and pt able to power to stand with min guard    Ambulation/Gait Ambulation/Gait assistance: Min guard Gait Distance (Feet): 20 Feet (x2) Assistive device: Rolling walker (2 wheels) Gait Pattern/deviations: Step-to pattern, Decreased stride length, Narrow base of support Gait velocity: decreased  General Gait Details: pt with initial shakiness upon standing, ambulates 20 ft with RW, slow step to pattern with narrow BOS, then able to complete additional ambulation after seated rest break, no overt LOB and family in room reports pt "looks normal" with ambulation   Stairs             Wheelchair Mobility    Modified Rankin (Stroke Patients Only)       Balance Overall balance assessment: Needs assistance  Standing balance support: During functional activity, Bilateral upper extremity supported, Reliant on assistive device for balance Standing balance-Leahy Scale: Poor     Cognition Arousal/Alertness: Awake/alert Behavior During Therapy: WFL for tasks assessed/performed Overall Cognitive Status: Within Functional Limits for tasks assessed     Exercises      General Comments        Pertinent Vitals/Pain Pain Assessment Pain Assessment: Faces Faces Pain Scale: Hurts a little bit Pain Location: low back Pain Descriptors / Indicators: Grimacing, Discomfort Pain Intervention(s): Limited activity within patient's tolerance, Monitored during session    Home Living  Prior Function            PT Goals (current goals can now be found in the care plan section) Acute Rehab PT Goals Patient Stated Goal: home with family  to support PT Goal Formulation: With patient Time For Goal Achievement: 03/21/21 Potential to Achieve Goals: Good Progress towards PT goals: Progressing toward goals    Frequency    Min 3X/week      PT Plan Current plan remains appropriate    Co-evaluation              AM-PAC PT "6 Clicks" Mobility   Outcome Measure  Help needed turning from your back to your side while in a flat bed without using bedrails?: A Little Help needed moving from lying on your back to sitting on the side of a flat bed without using bedrails?: A Little Help needed moving to and from a bed to a chair (including a wheelchair)?: A Little Help needed standing up from a chair using your arms (e.g., wheelchair or bedside chair)?: A Little Help needed to walk in hospital room?: A Little Help needed climbing 3-5 steps with a railing? : A Lot 6 Click Score: 17    End of Session Equipment Utilized During Treatment: Gait belt Activity Tolerance: Patient tolerated treatment well Patient left: in chair;with call bell/phone within reach;with family/visitor present Nurse Communication: Mobility status PT Visit Diagnosis: Unsteadiness on feet (R26.81);Other abnormalities of gait and mobility (R26.89);Muscle weakness (generalized) (M62.81)     Time: 2811-8867 PT Time Calculation (min) (ACUTE ONLY): 20 min  Charges:  $Gait Training: 8-22 mins                      Tori Amayra Kiedrowski PT, DPT 03/09/21, 1:44 PM

## 2021-03-09 NOTE — Telephone Encounter (Signed)
Oral Oncology Patient Advocate Encounter  After completing a benefits investigation, prior authorization for Xeloda is not required at this time through Medicare B.  Patient's copay is $20.41.    [place] [facility] [place] Phone [phone] Fax [phone] 03/09/2021 8:37 AM

## 2021-03-09 NOTE — Discharge Summary (Signed)
Discharge Summary  Leslie [name] YKD:[id number] DOB: 09-Jul-1945  PCP: Mayra Neer, MD  Admit date: 03/04/2021 Discharge date: 03/09/2021  Time spent: 51mins, more than 50% time spent on coordination of care.  Case discussed with social worker and palliative care physician Dr Rowe Pavy. son updated at bedside.  Recommendations for Outpatient Follow-up:  F/u with PCP within a week  for hospital discharge follow up, repeat cbc/bmp at follow up F/u with oncology F/u [facility] as needed Home health/outpatient palliative care   Discharge Diagnoses:  Active Hospital Problems   Diagnosis Date Noted   Hyponatremia 03/04/2021   Pressure injury of skin 03/06/2021   Foley catheter in place 03/06/2021   Pyuria 03/05/2021   Acute hyponatremia 03/05/2021   Abdominal pain 03/04/2021   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HCC)    Malignant neoplasm of upper-outer quadrant of left breast in female, estrogen receptor positive ([place]) 11/05/2019    Resolved Hospital Problems  No resolved problems to display.    Discharge Condition: stable  Diet recommendation: Regular diet  Filed Weights   03/05/21 1406  Weight: 46 kg    History of present illness: (Per admitting MD Dr. Trilby Drummer) Chief Complaint: Abdominal pain   HPI: Leslie [name] is a 76 y.o. 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  Patient initially began to experience abdominal pain 2 days ago was seen in the ED 2 days ago for the same.  Labs and CT were reassuring but there was significant stool burden/constipation present.  This improved with efforts for cleanout and large bowel movement patient was subsequently discharged home on MiraLAX.  After returning home she has not had a repeat bowel movement despite compliance with MiraLAX.  She also states her abdominal  pain returned and has been severe as well.  She has passed some gas but has also had nausea and decreased p.o. intake.  EDP did discuss possibility of discharge home versus admission and patient is anxious about going home and also has abnormal lab values as below.  Patient denies fevers, chills, chest pain, shortness of breath.   ED Course: Vital signs in the ED stable.  Lab work-up showed CMP with sodium 125, chloride 92, calcium 8.3 which corrects considering albumin 3.0, protein 5.9, ALT stable at 67.  CBC with platelets stable at 124.  Lipase normal.  Urinalysis with small leuks and rare bacteria with urine culture pending.  CT of the abdomen pelvis showed no obstruction or free air, no hydronephrosis, bilateral pleural effusions consistent with associated atelectasis versus pneumonia, bony mets also noted.  Patient received Dilaudid, Keflex, Zofran, IV fluids in the ED.    Hospital Course:  Principal Problem:   Hyponatremia Active Problems:   Malignant neoplasm of upper-outer quadrant of left breast in female, estrogen receptor positive (HCC)   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HCC)   Abdominal pain   Pyuria   Acute hyponatremia   Pressure injury of skin   Foley catheter in place   Assessment and Plan:  76 year old past medical history significant for , PAD s/p balloon angioplasty to mid left S FA in 2019, recurrent breast cancer with diffuse leptomeningeal disease from surface of brain to cauda equina nerve root and   widespread metastatic disease throughout the thoracic and lumbar spine, thoracic paraspinal muscle and bilateral gluteal muscle on MRI 1//2023, she was hospitalized from 02/07/18 23 to 02/19/2021 due to cauda Equina compression received  cranial radiation , started on steroid and discharged with a Foley catheter ,presenting with recurrent abdominal pain, accompanied by nausea and decreased oral intake.   She is found to have hyponatremia for which she started IV  hydration Abdomen pain has resolved after treating constipation .    Hyponatremia- (present on admission) -Patient presented to the ED due to abnormal pain initially and found to have hyponatremia -Hyponatremia likely secondary to poor oral intake, hypovolemia. 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  No hydronephrosis.  Small to moderate bilateral pleural effusion.  Patchy infiltrates both lower lung fields.  Extensive skeletal metastatic disease.  There is abnormal patchy enhancement in multiple muscles suggesting possible active inflammatory or neoplastic process. -Continue with laxatives.  -Added PPI to cover for gastritis, she has been on dexamethasone     Cauda Equina compression/urinary retention -She received radiation treatment from 1/26 to 2/8, Per radiation oncology ,no more radiation treatment planned -Currently on Decadron 2 mg twice daily -Patient  had a Foley catheter placed on 02/07/2021, failed voiding trial on 2/15, new Foley placed on 03/07/2021 -she Will likely be Foley dependent due to cauda equina compression syndrome, will need to have Foley replaced monthly, patient expressed understanding -[facility] information provided if he desires to follow-up with urology   Pyuria Foley exchanged  .  Malignant neoplasm of upper-outer quadrant of left breast in female, estrogen receptor positive ([place]) 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[place])- (present on admission) recurrent breast cancer with diffuse leptomeningeal disease from surface of brain to cauda equina nerve root and  widespread metastatic disease throughout the thoracic and lumbar  spine, thoracic paraspinal muscle and bilateral gluteal muscle on MRI 1//2023,  -she was hospitalized from 02/07/18 23 to 02/19/2021 due to cauda Equina compression received cranial radiation , started on steroid and discharged with a Foley catheter  --radiation oncology do not plan for further radiation at this point, recommend discharge on steroid taper.  -Palliative care consulted, she desires to go home with home health and outpatient palliative care, she desires a trial of Xeloda, she is to follow-up with [place] oncology Dr. Chryl Heck       FTT, patient desires to go home with home health, recommend outpatient palliative care to continue follow  Code status: DNR   Discharge Exam: BP 122/63 (BP Location: Right Arm)    Pulse 83    Temp 98.4 F (36.9 C) (Oral)    Resp 16    Ht 5\' 4"  (1.626 m)    Wt 46 kg    SpO2 93%    BMI 17.41 kg/m   General: AAOx3. + foley Cardiovascular: RRR Respiratory: Normal respiratory effort    Discharge Instructions     Diet general   Complete by: As directed    Discharge wound care:   Complete by: As directed    Pressure offloading   Increase activity slowly   Complete by: As directed       Allergies as of 03/09/2021       Reactions   Aspirin Nausea And Vomiting   Cetirizine Other (See Comments)   Chest Pain   Rosuvastatin Other (See Comments)   MD suspended this due to bone pain  Medication List     TAKE these medications    anastrozole 1 MG tablet Commonly known as: ARIMIDEX Take 1 tablet (1 mg total) by mouth daily.   capecitabine 500 MG tablet Commonly known as: XELODA Take 2 tablets (1,000 mg total) by mouth 2 (two) times daily after a meal. Take on days 1-14. Repeat every 21 days.   dexamethasone 2 MG tablet Commonly known as: DECADRON Take 2mg  twice a day on 2/18 , 2/19, 2/20, then take 2mg  once a day from 2/21 for a week, then take 2mg  every other day from 2/28, stop on 03/30/2021 What changed:  medication strength how  much to take how to take this when to take this additional instructions   Evening Primrose Oil 1000 MG Caps Take 1,000 mg by mouth daily.   feeding supplement Liqd Take 237 mLs by mouth 2 (two) times daily between meals.   fluocinonide cream 0.05 % Commonly known as: LIDEX Apply 1 application topically 2 (two) times daily as needed (for irritation- affected areas).   folic acid 195 MCG tablet Commonly known as: FOLVITE Take 400 mcg by mouth daily.   lidocaine 5 % Commonly known as: LIDODERM Place 1 patch onto the skin See admin instructions. Apply 1 patch to the lower back once a day and remove & discard the patch within 12 hours or as directed by MD   mirtazapine 7.5 MG tablet Commonly known as: REMERON Take 7.5 mg by mouth at bedtime.   montelukast 10 MG tablet Commonly known as: SINGULAIR Take 10 mg by mouth at bedtime.   MULTIVITAMIN PO Take 1 tablet by mouth daily with breakfast.   nitrofurantoin (macrocrystal-monohydrate) 100 MG capsule Commonly known as: MACROBID Take 1 capsule (100 mg total) by mouth at bedtime.   ondansetron 8 MG tablet Commonly known as: ZOFRAN Take 1 tablet (8 mg total) by mouth every 8 (eight) hours as needed for nausea or vomiting.   oxyCODONE-acetaminophen 5-325 MG tablet Commonly known as: PERCOCET/ROXICET Take 1 tablet by mouth every 6 (six) hours as needed for up to 7 days for severe pain.   pantoprazole 40 MG tablet Commonly known as: PROTONIX Take 1 tablet (40 mg total) by mouth 2 (two) times daily.   polyethylene glycol 17 g packet Commonly known as: MIRALAX / GLYCOLAX Take 17 g by mouth 2 (two) times daily.   senna-docusate 8.6-50 MG tablet Commonly known as: Senokot-S Take 1 tablet by mouth 2 (two) times daily.   triamcinolone cream 0.1 % Commonly known as: KENALOG Apply 1 application topically daily as needed (for itching- affectes sites).   Vitamin D3 10 MCG (400 UNIT) Caps Take 400 Units by mouth daily with  breakfast.   vitamin E 180 MG (400 UNITS) capsule Take 400 Units by mouth daily.               Durable Medical Equipment  (From admission, onward)           Start     Ordered   03/09/21 1029  For home use only DME Tub bench  Once        03/09/21 1028   03/09/21 0903  For home use only DME Bedside commode  Once       Question:  Patient needs a bedside commode to treat with the following condition  Answer:  FTT (failure to thrive) in adult   03/09/21 0902   03/09/21 0903  For home use only DME Shower stool  Once  03/09/21 0903   03/09/21 0901  For home use only DME Hospital bed  Once       Question Answer Comment  Length of Need Lifetime   Bed type Semi-electric   Support Surface: Low Air loss Mattress      03/09/21 0901              Discharge Care Instructions  (From admission, onward)           Start     Ordered   03/09/21 0000  Discharge wound care:       Comments: Pressure offloading   03/09/21 0749           Allergies  Allergen Reactions   Aspirin Nausea And Vomiting   Cetirizine Other (See Comments)    Chest Pain    Rosuvastatin Other (See Comments)    MD suspended this due to bone pain    Follow-up Information     [facility] Follow up.   Why: catheter clinic for voiding trial Contact information: [place]        Mayra Neer, MD Follow up.   Specialty: Family Medicine Contact information: [address] [place] [address] [phone]         Benay Pike, MD Follow up.   Specialty: Hematology and Oncology Contact information: [place] [address] [place], [place] Follow up.   Why: Your home health agency will be in contact with you about resuming services Contact information: [place] [address] [phone]                   The results of significant diagnostics from this hospitalization (including imaging, microbiology, ancillary and laboratory) are listed below for reference.    Significant Diagnostic Studies: CT HEAD WO CONTRAST (5MM)  Result Date: 02/08/2021 CLINICAL DATA:  Study prior to lumbar puncture EXAM: CT HEAD WITHOUT CONTRAST TECHNIQUE: Contiguous axial images were obtained from the base of the skull through the vertex without intravenous contrast. RADIATION DOSE REDUCTION: This exam was performed according to the departmental dose-optimization program which includes automated exposure control, adjustment of the mA and/or kV according to patient size and/or use of iterative reconstruction technique. COMPARISON:  MR brain done on 09/13/2009 FINDINGS: Brain: No acute intracranial findings are seen. Ventricles are not dilated. Cortical sulci are prominent. There are no epidural or subdural fluid collections. There is decreased density in the periventricular white matter. Vascular: Unremarkable. Skull: Unremarkable. Sinuses/Orbits: Visualized paranasal sinuses are unremarkable. There is fluid in the right mastoid air cells. There is no break in the cortical margins in the right mastoid. Other: None IMPRESSION: No acute intracranial findings are seen. Atrophy. Small-vessel disease. There is fluid density in the right mastoid air cells suggesting right mastoid effusion or chronic right mastoiditis. Electronically Signed   By: Elmer Picker M.D.   On: 02/08/2021 12:43   CT CHEST W CONTRAST  Result Date: 02/08/2021 CLINICAL DATA:  Restaging metastatic breast cancer. EXAM: CT CHEST, ABDOMEN, AND PELVIS WITH CONTRAST TECHNIQUE: Multidetector CT imaging of the chest, abdomen and pelvis was performed following the standard protocol during bolus administration of intravenous contrast. RADIATION DOSE REDUCTION: This exam was performed according to the departmental dose-optimization program which includes  automated exposure control, adjustment of the mA and/or kV according to patient size and/or use  of iterative reconstruction technique. CONTRAST:  69mL OMNIPAQUE IOHEXOL 300 MG/ML  SOLN COMPARISON:  Chest CT 01/03/2021. Abdominal/pelvic CT scan 08/14/2018 FINDINGS: CT CHEST FINDINGS Cardiovascular: The heart is normal in size. No pericardial effusion. The aorta is normal in caliber. No dissection. Stable aortic and coronary artery calcifications. Mediastinum/Nodes: No mediastinal or hilar mass or lymphadenopathy. The esophagus is grossly normal. Lungs/Pleura: Stable emphysematous changes and areas of pulmonary scarring. Small scattered perifissural nodules consistent with small lymph nodes. No worrisome pulmonary lesions to suggest pulmonary metastatic disease. There are bilateral pleural effusions, left larger than right with overlying atelectasis left greater than right. No obvious enhancing pleural nodules. Musculoskeletal: Overall stable appearing severe diffuse metastatic bone disease with mixed lytic and sclerotic changes and old pathologic fractures. I do not see any obvious new or progressive findings. The left paraspinal muscles are thickened and demonstrate heterogeneous enhancement findings suspicious for tumor involvement. CT ABDOMEN PELVIS FINDINGS Hepatobiliary: No hepatic lesions to suggest metastatic disease. The gallbladder is unremarkable. No intra or extrahepatic biliary dilatation. Pancreas: No mass, inflammation or ductal dilatation. Spleen: Normal size.  No focal lesions. Adrenals/Urinary Tract: The adrenal glands and kidneys are unremarkable and stable. Small renal cysts are noted. No worrisome renal lesions. The bladder is decompressed by a Foley catheter. Stomach/Bowel: Stomach, duodenum, small bowel and colon are grossly normal. Vascular/Lymphatic: Stable advanced atherosclerotic calcifications involving the aorta and iliac arteries but no aneurysm. Stable small scattered mesenteric and  retroperitoneal lymph nodes but no mass or adenopathy. No findings suspicious for peritoneal carcinomatosis. Reproductive: Surgically absent. Other: No free air or free fluid. 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 There is a large lytic lesion involving the right acetabulum but I do not see a definite pathologic fracture. Lytic lesion involving the upper right iliac bone with destruction of the medial cortex. No obvious pathologic hip fracture. Large enhancing masslike area in the left gluteus medius muscle worrisome for a muscle involvement, likely from direct extension. Other areas of abnormal muscle enhancement could suggest muscle lesions. IMPRESSION: 1. 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 2. Several areas of masslike enhancement in the muscles of the spine and pelvis worrisome for tumor involvement. 3. No findings suspicious for pulmonary metastatic disease. 4. Bilateral pleural effusions, left larger than right with overlying atelectasis left greater than right. 5. No findings suspicious for abdominal/pelvic metastatic disease. 6. Stable advanced atherosclerotic calcifications involving the thoracic and abdominal aorta and iliac arteries. Aortic Atherosclerosis (ICD10-I70.0) and Emphysema (ICD10-J43.9). Electronically Signed   By: Marijo Sanes M.D.   On: 02/08/2021 19:23   MR BRAIN W WO CONTRAST  Result Date: 02/09/2021 CLINICAL DATA:  Restaging metastatic cancer, history of breast cancer with widespread osseous metastatic disease EXAM: MRI HEAD WITHOUT AND WITH CONTRAST MRI CERVICAL SPINE WITHOUT AND WITH CONTRAST TECHNIQUE: Multiplanar, multiecho pulse sequences of the brain and surrounding structures, and cervical spine, to include the craniocervical junction and cervicothoracic junction, were obtained without and with intravenous  contrast. CONTRAST:  45mL GADAVIST GADOBUTROL 1 MMOL/ML IV SOLN COMPARISON:  09/13/2009 MRI head, correlation is also made with 02/08/2021 CT head; no prior MRI of the cervical spine, correlation is made with 02/07/2021 MRI thoracic spine FINDINGS: MRI HEAD FINDINGS Brain: No restricted diffusion to suggest acute or subacute infarct. No acute hemorrhage, mass, mass effect, or midline shift. Leptomeningeal enhancement in the anterior temporal lobes (series 20,  images 23 and 22), posterior left temporal lobe (series 20, image 22), along the anterior surface of the midbrain (series 28, image 25) and pons (series 30, image 12), and in the cerebellar folia (series 28, images 17-25). Enhancement of the medulla and brainstem are better seen on the cervical spine images (series 26, image 7). No hydrocephalus or extra-axial collection. Scattered T2 hyperintense signal in the periventricular white matter, likely the sequela of mild chronic small vessel ischemic disease. No cerebral edema. Vascular: Normal flow voids. Skull and upper cervical spine: In the midline/left parietal calvarium, there is a 10 x 5 x 8 mm mildly enhancing lesion (series 28, image 35 and series 30, image 14). Otherwise normal marrow signal. Sinuses/Orbits: No acute finding. Status post bilateral lens replacements. Other: Fluid throughout the right mastoid air cells. MRI CERVICAL SPINE FINDINGS Evaluation is somewhat limited by motion artifact. Alignment: Straightening of the normal cervical lordosis. No listhesis. Vertebrae: Abnormal T1 signal with patchy enhancement, for example in C2 and C6 consistent with known extensive osseous metastatic disease. No evidence of pathologic fracture. For findings in the thoracic spine, see 02/07/2021 MRI thoracic spine. Cord: Leptomeningeal enhancement along the brainstem and majority of the cervical spinal cord (series 26, image 7 and series 27, image 13). The spinal cord demonstrates increased T2 signal centrally  from the level of C2 and extending inferiorly throughout the imaged spinal cord, although this is most prominent from the level of C5 extending inferiorly (series 25, image 7 and series 22, image 17). From C6 through C7-T1, there also appears to be prominence of the central canal of the spinal cord (series 22, images 21-27). Posterior Fossa, vertebral arteries, paraspinal tissues: For findings related to the pons and cerebellum, please see MRI brain findings above. Disc levels: Degenerative changes without significant spinal canal stenosis. IMPRESSION: MRI BRAIN: 1. 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 2. No abnormal parenchymal enhancement to suggest parenchymal metastatic disease. 3. Focus of enhancement in the left parietal calvarium is favored to be benign. Attention on follow-up. MRI CERVICAL SPINE 1. Leptomeningeal enhancement along the brainstem and the majority of the cervical spinal cord, with increased T2 signal in the cervical spinal cord, most likely edema. In addition, there may be prominence of the central canal from C6 through C7-T1. 2. Known osseous metastatic disease, without evidence of pathologic fracture. Electronically Signed   By: Merilyn Baba M.D.   On: 02/09/2021 14:28   MR CERVICAL SPINE W WO CONTRAST  Result Date: 02/09/2021 CLINICAL DATA:  Restaging metastatic cancer, history of breast cancer with widespread osseous metastatic disease EXAM: MRI HEAD WITHOUT AND WITH CONTRAST MRI CERVICAL SPINE WITHOUT AND WITH CONTRAST TECHNIQUE: Multiplanar, multiecho pulse sequences of the brain and surrounding structures, and cervical spine, to include the craniocervical junction and cervicothoracic junction, were obtained without and with intravenous contrast. CONTRAST:  12mL GADAVIST GADOBUTROL 1 MMOL/ML IV SOLN COMPARISON:  09/13/2009 MRI head, correlation is also  made with 02/08/2021 CT head; no prior MRI of the cervical spine, correlation is made with 02/07/2021 MRI thoracic spine FINDINGS: MRI HEAD FINDINGS Brain: No restricted diffusion to suggest acute or subacute infarct. No acute hemorrhage, mass, mass effect, or midline shift. Leptomeningeal enhancement in the anterior temporal lobes (series 20, images 23 and 22), posterior left temporal lobe (series 20, image 22), along the anterior surface of the midbrain (series 28, image 25) and pons (series 30, image 12),  and in the cerebellar folia (series 28, images 17-25). Enhancement of the medulla and brainstem are better seen on the cervical spine images (series 26, image 7). No hydrocephalus or extra-axial collection. Scattered T2 hyperintense signal in the periventricular white matter, likely the sequela of mild chronic small vessel ischemic disease. No cerebral edema. Vascular: Normal flow voids. Skull and upper cervical spine: In the midline/left parietal calvarium, there is a 10 x 5 x 8 mm mildly enhancing lesion (series 28, image 35 and series 30, image 14). Otherwise normal marrow signal. Sinuses/Orbits: No acute finding. Status post bilateral lens replacements. Other: Fluid throughout the right mastoid air cells. MRI CERVICAL SPINE FINDINGS Evaluation is somewhat limited by motion artifact. Alignment: Straightening of the normal cervical lordosis. No listhesis. Vertebrae: Abnormal T1 signal with patchy enhancement, for example in C2 and C6 consistent with known extensive osseous metastatic disease. No evidence of pathologic fracture. For findings in the thoracic spine, see 02/07/2021 MRI thoracic spine. Cord: Leptomeningeal enhancement along the brainstem and majority of the cervical spinal cord (series 26, image 7 and series 27, image 13). The spinal cord demonstrates increased T2 signal centrally from the level of C2 and extending inferiorly throughout the imaged spinal cord, although this is most prominent from  the level of C5 extending inferiorly (series 25, image 7 and series 22, image 17). From C6 through C7-T1, there also appears to be prominence of the central canal of the spinal cord (series 22, images 21-27). Posterior Fossa, vertebral arteries, paraspinal tissues: For findings related to the pons and cerebellum, please see MRI brain findings above. Disc levels: Degenerative changes without significant spinal canal stenosis. IMPRESSION: MRI BRAIN: 1. 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 2. No abnormal parenchymal enhancement to suggest parenchymal metastatic disease. 3. Focus of enhancement in the left parietal calvarium is favored to be benign. Attention on follow-up. MRI CERVICAL SPINE 1. Leptomeningeal enhancement along the brainstem and the majority of the cervical spinal cord, with increased T2 signal in the cervical spinal cord, most likely edema. In addition, there may be prominence of the central canal from C6 through C7-T1. 2. Known osseous metastatic disease, without evidence of pathologic fracture. Electronically Signed   By: Merilyn Baba M.D.   On: 02/09/2021 14:28   CT Abdomen Pelvis W Contrast  Result Date: 03/04/2021 CLINICAL DATA:  Abdominal pain, metastatic breast carcinoma EXAM: CT ABDOMEN AND PELVIS WITH CONTRAST TECHNIQUE: Multidetector CT imaging of the abdomen and pelvis was performed using the standard protocol following bolus administration of intravenous contrast. RADIATION DOSE REDUCTION: This exam was performed according to the departmental dose-optimization program which includes automated exposure control, adjustment of the mA and/or kV according to patient size and/or use of iterative reconstruction technique. CONTRAST:  18mL OMNIPAQUE IOHEXOL 300 MG/ML  SOLN COMPARISON:  03/02/2021 FINDINGS: Lower chest: Small to moderate bilateral pleural  effusions are seen, more so on the left side. There are patchy infiltrates in the posterior aspects of both lower lung fields with interval worsening. Hepatobiliary: No focal abnormality is seen in the liver. There is subtle increased density in lumen of gallbladder, possibly due to vicarious excretion of iodinated contrast from previous CT. There is no wall thickening in gallbladder. Pancreas: No focal abnormality is seen. There is prominence of pancreatic duct in the head and body. Spleen: Spleen appears smaller than usual in size. Adrenals/Urinary Tract: Adrenals are unremarkable. There is no hydronephrosis. There  are no renal or ureteral stones. There is 1.6 cm smooth marginated fluid density lesion in the midportion of left kidney, possibly a cyst urinary bladder is not distended. Foley catheter is seen in the bladder. Stomach/Bowel: Stomach is unremarkable. Small bowel loops are not dilated. Appendix is not distinctly seen. There is no focal pericecal inflammation. There is no significant wall thickening in the colon. There is no pericolic stranding or fluid collection. Vascular/Lymphatic: Arterial calcifications are seen in the aorta and its major branches. Reproductive: Uterus is not seen. There are no dominant adnexal masses. Other: There is possible trace amount of free fluid in the right side of pelvis. There is no pneumoperitoneum. Small right inguinal hernia containing fat is seen. There is subcutaneous edema in the abdominal wall. Musculoskeletal: There is patchy enhancement in the paraspinal muscles, right iliacus muscle and gluteal muscles on both sides, more so on the left side. Left psoas muscle appears smaller than usual. These findings have not changed significantly. There are numerous lytic lesions of varying sizes in the skeletal structures consistent with skeletal metastatic disease. IMPRESSION: There is no evidence of intestinal obstruction or pneumoperitoneum. There is no hydronephrosis.  Small to moderate bilateral pleural effusions, more so on the left side. There are patchy infiltrates in both lower lung fields with possible slight worsening suggesting atelectasis/pneumonia. Extensive skeletal metastatic disease. There is abnormal patchy enhancement in multiple muscles suggesting possible active inflammatory or neoplastic process. Other findings as described in the body of the report. Extensive skeletal metastatic disease. There is patchy enhancement in the multiple muscles as described in the body of the report which may suggest inflammatory or neoplastic process in the soft tissues. Electronically Signed   By: Elmer Picker M.D.   On: 03/04/2021 17:10   CT ABDOMEN PELVIS W CONTRAST  Result Date: 03/02/2021 CLINICAL DATA:  Abdomen pain EXAM: CT ABDOMEN AND PELVIS WITH CONTRAST TECHNIQUE: Multidetector CT imaging of the abdomen and pelvis was performed using the standard protocol following bolus administration of intravenous contrast. RADIATION DOSE REDUCTION: This exam was performed according to the departmental dose-optimization program which includes automated exposure control, adjustment of the mA and/or kV according to patient size and/or use of iterative reconstruction technique. CONTRAST:  143mL OMNIPAQUE IOHEXOL 300 MG/ML  SOLN COMPARISON:  CT 08/14/2018, CT 02/08/2021 FINDINGS: Lower chest: Small right and small moderate left pleural effusions. Normal cardiac size. Partial consolidations in the lower lobes likely due to atelectasis. Hepatobiliary: No focal liver abnormality is seen. No gallstones, gallbladder wall thickening, or biliary dilatation. Pancreas: Unremarkable. No pancreatic ductal dilatation or surrounding inflammatory changes. Spleen: Normal in size without focal abnormality. Adrenals/Urinary Tract: Adrenal glands are within normal limits. No hydronephrosis. Cyst midpole left kidney. Foley catheter in the bladder. Stomach/Bowel: The stomach is nonenlarged. No  dilated small bowel. No acute bowel wall thickening. Large stool burden. Vascular/Lymphatic: Advanced aortic atherosclerosis. No aneurysm. No increasing adenopathy. Reproductive: Status post hysterectomy. No adnexal masses. Other: Negative for pelvic effusion or free air. Musculoskeletal: Heterogenous masslike areas of muscle enhancement involving the gluteal regions, right anterior hip muscles, right iliacus muscle, and the paraspinal musculature. Extensive lytic and sclerotic lesions consistent with widespread skeletal metastatic disease. IMPRESSION: 1. Negative for bowel obstruction or bowel wall thickening. Large stool burden suggesting constipation. 2. Small moderate left and small right-sided pleural effusion with probable passive atelectasis at the left greater than right lower lobes 3. 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 Extensive heterogeneous masslike enhancement involving the gluteal muscles, paraspinal muscles, and right  hip muscles concerning for soft tissue metastatic disease. Electronically Signed   By: Donavan Foil M.D.   On: 03/02/2021 22:29   CT ABDOMEN PELVIS W CONTRAST  Result Date: 02/08/2021 CLINICAL DATA:  Restaging metastatic breast cancer. EXAM: CT CHEST, ABDOMEN, AND PELVIS WITH CONTRAST TECHNIQUE: Multidetector CT imaging of the chest, abdomen and pelvis was performed following the standard protocol during bolus administration of intravenous contrast. RADIATION DOSE REDUCTION: This exam was performed according to the departmental dose-optimization program which includes automated exposure control, adjustment of the mA and/or kV according to patient size and/or use of iterative reconstruction technique. CONTRAST:  76mL OMNIPAQUE IOHEXOL 300 MG/ML  SOLN COMPARISON:  Chest CT 01/03/2021. Abdominal/pelvic CT scan 08/14/2018 FINDINGS: CT CHEST FINDINGS Cardiovascular: The heart is normal in size. No pericardial effusion. The aorta is normal in caliber. No dissection. Stable  aortic and coronary artery calcifications. Mediastinum/Nodes: No mediastinal or hilar mass or lymphadenopathy. The esophagus is grossly normal. Lungs/Pleura: Stable emphysematous changes and areas of pulmonary scarring. Small scattered perifissural nodules consistent with small lymph nodes. No worrisome pulmonary lesions to suggest pulmonary metastatic disease. There are bilateral pleural effusions, left larger than right with overlying atelectasis left greater than right. No obvious enhancing pleural nodules. Musculoskeletal: Overall stable appearing severe diffuse metastatic bone disease with mixed lytic and sclerotic changes and old pathologic fractures. I do not see any obvious new or progressive findings. The left paraspinal muscles are thickened and demonstrate heterogeneous enhancement findings suspicious for tumor involvement. CT ABDOMEN PELVIS FINDINGS Hepatobiliary: No hepatic lesions to suggest metastatic disease. The gallbladder is unremarkable. No intra or extrahepatic biliary dilatation. Pancreas: No mass, inflammation or ductal dilatation. Spleen: Normal size.  No focal lesions. Adrenals/Urinary Tract: The adrenal glands and kidneys are unremarkable and stable. Small renal cysts are noted. No worrisome renal lesions. The bladder is decompressed by a Foley catheter. Stomach/Bowel: Stomach, duodenum, small bowel and colon are grossly normal. Vascular/Lymphatic: Stable advanced atherosclerotic calcifications involving the aorta and iliac arteries but no aneurysm. Stable small scattered mesenteric and retroperitoneal lymph nodes but no mass or adenopathy. No findings suspicious for peritoneal carcinomatosis. Reproductive: Surgically absent. Other: No free air or free fluid. 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 There is a large lytic lesion involving the right acetabulum but I do  not see a definite pathologic fracture. Lytic lesion involving the upper right iliac bone with destruction of the medial cortex. No obvious pathologic hip fracture. Large enhancing masslike area in the left gluteus medius muscle worrisome for a muscle involvement, likely from direct extension. Other areas of abnormal muscle enhancement could suggest muscle lesions. IMPRESSION: 1. 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 2. Several areas of masslike enhancement in the muscles of the spine and pelvis worrisome for tumor involvement. 3. No findings suspicious for pulmonary metastatic disease. 4. Bilateral pleural effusions, left larger than right with overlying atelectasis left greater than right. 5. No findings suspicious for abdominal/pelvic metastatic disease. 6. Stable advanced atherosclerotic calcifications involving the thoracic and abdominal aorta and iliac arteries. Aortic Atherosclerosis (ICD10-I70.0) and Emphysema (ICD10-J43.9). Electronically Signed   By: Marijo Sanes M.D.   On: 02/08/2021 19:23   DG Chest Portable 1 View  Result Date: 02/07/2021 CLINICAL DATA:  Shortness of breath, difficulty urinating EXAM: PORTABLE CHEST 1 VIEW COMPARISON:  10/11/2020 FINDINGS: Mild cardiomegaly. Mild, diffuse bilateral interstitial pulmonary opacity. Small right  pleural effusion. Unchanged elevation of the left hemidiaphragm with associated atelectasis or consolidation. The visualized skeletal structures are unremarkable. IMPRESSION: 1. Mild, diffuse bilateral interstitial pulmonary opacity, most consistent with edema. No focal airspace opacity. 2. Small right pleural effusion. 3. Unchanged elevation of the left hemidiaphragm with associated atelectasis or consolidation. Electronically Signed   By: Delanna Ahmadi M.D.   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  EXAM: DIAGNOSTIC LUMBAR PUNCTURE UNDER FLUOROSCOPIC GUIDANCE COMPARISON:  Spine MRI 02/07/2021. Today's head CT, dictated separately. FLUOROSCOPY TIME:  Fluoroscopy Time:  2 minutes and 48 seconds Radiation Exposure Index (if provided by the fluoroscopic device): 14.9 mGy Number of Acquired Spot Images: 0 PROCEDURE: Informed consent was obtained from the patient prior to the procedure, including potential complications of headache, allergy, and pain. With the patient prone, the lower back was prepped with Betadine. 1% Lidocaine was used for local anesthesia. Lumbar puncture was performed at the L4-5 level using a 20 gauge needle with return of clear CSF with an opening pressure of 9 cm water. After obtaining 2 vials each with 2 cc of clear CSF, the return of CSF became sanguinous. 1 cc of sanguinous CSF was obtained. At this point, the tubing was removed and the stylette placed in order to attempt to clear the CSF flow. Despite multiple maneuvers, CSF return could not be obtained a second time. The patient tolerated the procedure well and there were no apparent complications. IMPRESSION: Successful lumbar puncture, as detailed above. Only a total of 5 cc of CSF were obtained, secondary to the CSF flow becoming sanguinous. Electronically Signed   By: Abigail Miyamoto M.D.   On: 02/08/2021 13:58    Microbiology: Recent Results (from the past 240 hour(s))  Urine Culture     Status: Abnormal   Collection Time: 03/04/21  2:15 PM   Specimen: Urine, Suprapubic  Result Value Ref Range Status   Specimen Description   Final    URINE, SUPRAPUBIC Performed at [facility] [address] [place], [place] [address]    Special Requests   Final    NONE Performed at [facility], [place] [address] [place], [place] [address]    Culture (A)  Final    70,000 COLONIES/mL ENTEROCOCCUS FAECALIS 40,000 COLONIES/mL YEAST    Report Status 03/06/2021 FINAL  Final  Resp Panel by RT-PCR (Flu A&B, Covid)  Nasopharyngeal Swab     Status: None   Collection Time: 03/05/21  8:53 AM   Specimen: Nasopharyngeal Swab; Nasopharyngeal(NP) swabs in vial transport medium  Result Value Ref Range Status   SARS Coronavirus 2 by RT PCR NEGATIVE NEGATIVE Final    Comment: (NOTE) SARS-CoV-2 target nucleic acids are NOT DETECTED.  The SARS-CoV-2 RNA is generally detectable in upper respiratory specimens during the acute phase of infection. The lowest concentration of SARS-CoV-2 viral copies this assay can detect is 138 copies/mL. A negative result does not preclude SARS-Cov-2 infection and should not be used as the sole basis for treatment or other patient management decisions. A negative result may occur with  improper specimen collection/handling, submission of specimen other than nasopharyngeal swab, presence of viral mutation(s) within the areas targeted by this assay, and inadequate number of viral copies(<138 copies/mL). A negative result must be combined with clinical observations, patient history, and epidemiological information. The expected result is Negative.  Fact Sheet for Patients:  EntrepreneurPulse.com.au  Fact Sheet for Healthcare Providers:  IncredibleEmployment.be  This test is no t yet approved or cleared by the [place] FDA and  has been authorized for detection and/or diagnosis of SARS-CoV-2 by FDA under an Emergency Use Authorization (EUA). This EUA will remain  in effect (meaning this test can be used) for the duration of the COVID-19 declaration under Section 564(b)(1) of the Act, 21 U.S.C.section 360bbb-3(b)(1), unless the authorization is terminated  or revoked sooner.       Influenza A by PCR NEGATIVE NEGATIVE Final   Influenza B by PCR NEGATIVE NEGATIVE Final    Comment: (NOTE) The Xpert Xpress SARS-CoV-2/FLU/RSV plus assay is intended as an aid in the diagnosis of influenza from Nasopharyngeal swab specimens and should not be  used as a sole basis for treatment. Nasal washings and aspirates are unacceptable for Xpert Xpress SARS-CoV-2/FLU/RSV testing.  Fact Sheet for Patients: EntrepreneurPulse.com.au  Fact Sheet for Healthcare Providers: IncredibleEmployment.be  This test is not yet approved or cleared by the [place] FDA and has been authorized for detection and/or diagnosis of SARS-CoV-2 by FDA under an Emergency Use Authorization (EUA). This EUA will remain in effect (meaning this test can be used) for the duration of the COVID-19 declaration under Section 564(b)(1) of the Act, 21 U.S.C. section 360bbb-3(b)(1), unless the authorization is terminated or revoked.  Performed at [facility], [place] [address], [place], [place] [address]      Labs: Basic Metabolic Panel: Recent Labs  Lab 03/04/21 2250 03/05/21 0610 03/05/21 0940 03/06/21 0521 03/08/21 0442  NA 127* 129* 132* 128* 134*  K 4.0 3.9 3.7 3.7 3.5  CL 96* 97* 105 96* 105  CO2 23 25 21* 23 22  GLUCOSE 111* 134* 114* 131* 132*  BUN 18 16 12 14 10   CREATININE <0.30* 0.43* <0.30* 0.53 0.55  CALCIUM 8.1* 8.1* 6.5* 8.0* 7.9*  MG  --   --   --   --  2.0   Liver Function Tests: Recent Labs  Lab 03/02/21 2010 03/04/21 1225  AST 39 40  ALT 73* 67*  ALKPHOS 86 70  BILITOT 0.6 0.6  PROT 6.2* 5.9*  ALBUMIN 3.2* 3.0*   Recent Labs  Lab 03/02/21 2010 03/04/21 1225  LIPASE 28 26   No results for input(s): AMMONIA in the last 168 hours. CBC: Recent Labs  Lab 03/02/21 2010 03/04/21 1225 03/05/21 0610  WBC 4.7 5.2 5.0  HGB 13.8 13.3 13.6  HCT 41.3 39.1 39.5  MCV 94.9 92.9 92.3  PLT 132* 124* 130*   Cardiac Enzymes: No results for input(s): CKTOTAL, CKMB, CKMBINDEX, TROPONINI in the last 168 hours. BNP: BNP (last 3 results) No results for input(s): BNP in the last 8760 hours.  ProBNP (last 3 results) No results for input(s): PROBNP in the last 8760  hours.  CBG: No results for input(s): GLUCAP in the last 168 hours.  FURTHER DISCHARGE INSTRUCTIONS:   Get Medicines reviewed and adjusted: Please take all your medications with you for your next visit with your Primary MD   Laboratory/radiological data: Please request your Primary MD to go over all hospital tests and procedure/radiological results at the follow up, please ask your Primary MD to get all Hospital records sent to his/her office.   In some cases, they will be blood work, cultures and biopsy results pending at the time of your discharge. Please request that your primary care M.D. goes through all the records of your hospital data and follows up on  these results.   Also Note the following: If you experience worsening of your admission symptoms, develop shortness of breath, life threatening emergency, suicidal or homicidal thoughts you must seek medical attention immediately by calling 911 or calling your MD immediately  if symptoms less severe.   You must read complete instructions/literature along with all the possible adverse reactions/side effects for all the Medicines you take and that have been prescribed to you. Take any new Medicines after you have completely understood and accpet all the possible adverse reactions/side effects.    Do not drive when taking Pain medications or sleeping medications (Benzodaizepines)   Do not take more than prescribed Pain, Sleep and Anxiety Medications. It is not advisable to combine anxiety,sleep and pain medications without talking with your primary care practitioner   Special Instructions: If you have smoked or chewed Tobacco  in the last 2 yrs please stop smoking, stop any regular Alcohol  and or any Recreational drug use.   Wear Seat belts while driving.   Please note: You were cared for by a hospitalist during your hospital stay. Once you are discharged, your primary care physician will handle any further medical issues. Please  note that NO REFILLS for any discharge medications will be authorized once you are discharged, as it is imperative that you return to your primary care physician (or establish a relationship with a primary care physician if you do not have one) for your post hospital discharge needs so that they can reassess your need for medications and monitor your lab values.     Signed:  Florencia Reasons MD, PhD, FACP  [facility] 03/09/2021, 3:13 PM

## 2021-03-09 NOTE — Progress Notes (Signed)
START ON PATHWAY REGIMEN - Breast     A cycle is every 21 days:     Capecitabine   **Always confirm dose/schedule in your pharmacy ordering system**  Patient Characteristics: Distant Metastases or Locoregional Recurrent Disease - Unresected or Locally Advanced Unresectable Disease Progressing after Neoadjuvant and Local Therapies, HER2 Low/Negative/Unknown, ER Positive, Chemotherapy, HER2 Negative/Unknown, Second Line Therapeutic Status: Distant Metastases HER2 Status: Negative (-) ER Status: Positive (+) PR Status: Negative (-) Therapy Approach Indicated: Standard Chemotherapy/Endocrine Therapy Line of Therapy: Second Line Intent of Therapy: Non-Curative / Palliative Intent, Discussed with Patient

## 2021-03-09 NOTE — Progress Notes (Signed)
Daily Progress Note   Patient Name: Leslie [name]       Date: 03/09/2021 DOB: 10/14/1945  Age: 76 y.o. MRN#: [id number] Attending Physician: Florencia Reasons, MD Primary Care Physician: Mayra Neer, MD Admit Date: 03/04/2021  Reason for Consultation/Follow-up: Establishing goals of care  Subjective:  Resting in bed, son at bedside during my visit. Patient states she is feeling better than yesterday, she recalls meeting with her oncologist and states she is looking forward to starting PO chemotherapy.     Length of Stay: 4  Current Medications: Scheduled Meds:   anastrozole  1 mg Oral Daily   Chlorhexidine Gluconate Cloth  6 each Topical Daily   cholecalciferol  400 Units Oral Daily   dexamethasone  2 mg Oral Q12H   docusate sodium  100 mg Oral BID   enoxaparin (LOVENOX) injection  30 mg Subcutaneous Q24H   feeding supplement  237 mL Oral BID BM   folic acid  7,588 mcg Oral Daily   lidocaine  1 patch Transdermal Daily   mirtazapine  7.5 mg Oral QHS   montelukast  10 mg Oral QHS   multivitamin with minerals   Oral Q breakfast   pantoprazole  40 mg Oral BID   polyethylene glycol  17 g Oral BID   senna-docusate  1 tablet Oral BID   sodium chloride flush  3 mL Intravenous Q12H   vitamin E  400 Units Oral Daily    Continuous Infusions:    PRN Meds: acetaminophen **OR** acetaminophen, fluocinonide cream, magnesium hydroxide, ondansetron **OR** ondansetron (ZOFRAN) IV, oxyCODONE-acetaminophen, sodium phosphate, sorbitol, triamcinolone cream  Physical Exam         Awake alert Oriented Clear breath sounds S 1 S 2  Abdomen not distended No edema  Vital Signs: BP 122/63 (BP Location: Right Arm)    Pulse 83    Temp 98.4 F (36.9 C) (Oral)    Resp 16    Ht 5\' 4"  (1.626 m)    Wt 46  kg    SpO2 93%    BMI 17.41 kg/m  SpO2: SpO2: 93 % O2 Device: O2 Device: Room Air O2 Flow Rate:    Intake/output summary:  Intake/Output Summary (Last 24 hours) at 03/09/2021 1130 Last data filed at 03/09/2021 0405 Gross per 24 hour  Intake 1156.35 ml  Output 250 ml  Net 906.35 ml    LBM: Last BM Date : 03/06/21 Baseline Weight: Weight: 46 kg Most recent weight: Weight: 46 kg       Palliative Assessment/Data:      Patient Active Problem List   Diagnosis Date Noted   Pressure injury of skin 03/06/2021   Foley catheter in place 03/06/2021   Pyuria 03/05/2021   Acute hyponatremia 03/05/2021   Hyponatremia 03/04/2021   Abdominal pain 03/04/2021   Leukocytosis 02/14/2021   Constipation 02/10/2021   Leptomeningeal disease    Metastatic malignant neoplasm ([place])    Protein-calorie malnutrition, severe 02/08/2021   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[place]) 02/07/2021   Acute urinary retention 02/07/2021   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[facility])    Spinal cord lesion ([facility])    Malignant neoplasm of upper-outer quadrant of left breast in female, estrogen receptor positive ([place]) 11/05/2019   Bone metastases ([place]) 10/29/2019   Hyperlipidemia 07/08/2017   Claudication in peripheral vascular disease ([place]) 06/19/2017   Essential hypertension 06/04/2017   Peripheral arterial disease ([place]) 06/04/2017   Tobacco abuse 06/04/2017   Hot flashes 11/26/2010    Palliative Care Assessment & Plan   Patient Profile:  76 year old past medical history significant for hypertension, hyperlipidemia, PAD, 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  Patient initially seen in the ED 2 days ago for similar complaints of abdominal pain.  CT was pursued and shows significant stool burden/constipation.  Patient received laxative  and she had a large bowel movement with subsequently discharged home on MiraLAX.   She presents with recurrence of abdominal pain, no further bowel movement since discharge form ED. he reports abdominal pain as severe, accompanied by nausea and decreased oral intake.   Patient was found to have hyponatremia with a sodium level of 125, UA with small leukocytes.  CT abdomen and pelvis repeated showed no obstruction or free air, no hydronephrosis, bilateral pleural effusion consistent with associated atelectasis versus pneumonia.  Bony mets also noted  Assessment:  Hyponatremia improved since admission, patient feeling better than when she came in.  Ongoing goals of care discussions.   Recommendations/Plan:  Recommend home with home based PT, outpatient palliative care. No further PMT specific recommendations Continue current mode of care.     Code Status:    Code Status Orders  (From admission, onward)           Start     Ordered   03/04/21 2032  Do not attempt resuscitation (DNR)  Continuous       Question Answer Comment  In the event of cardiac or respiratory ARREST Do not call a code blue   In the event of cardiac or respiratory ARREST Do not perform Intubation, CPR, defibrillation or ACLS   In the event of cardiac or respiratory ARREST Use medication by any route, position, wound care, and other measures to relive pain and suffering. May use oxygen, suction and manual treatment of airway obstruction as needed for comfort.      03/04/21 2032           Code Status History     Date Active Date Inactive Code Status Order ID Comments User Context   03/04/2021 1824 03/04/2021 2032 Full Code [id number]  Marcelyn Bruins, MD ED   02/07/2021 1836 02/19/2021 2214 DNR [id number]  Eugenie Filler, MD ED   06/19/2017  1704 06/20/2017 1428 Full Code [id number]  Lorretta Harp, MD Inpatient      Advance Directive Documentation    Flowsheet Row Most Recent Value  Type of Advance  Directive Living will  Pre-existing out of facility DNR order (yellow form or pink MOST form) --  "MOST" Form in Place? --       Prognosis:  Guarded   Discharge Planning: Home with PT and outpatient palliative.   Care plan was discussed with patient and son, IDT  Thank you for allowing the Palliative Medicine Team to assist in the care of this patient.   Time In: 11 Time Out: 11.25 Total Time 25 Prolonged Time Billed No       Greater than 50%  of this time was spent counseling and coordinating care related to the above assessment and plan.  Loistine Chance, MD  Please contact Palliative Medicine Team phone at [phone] for questions and concerns.

## 2021-03-09 NOTE — TOC Transition Note (Addendum)
Transition of Care [facility]) - CM/SW Discharge Note   Patient Details  Name: Leslie [name] MRN: [id number] Date of Birth: Nov 19, 1945  [facility]) CM/SW Contact:  Trish Mage, LCSW Phone Number: 03/09/2021, 12:46 PM   Clinical Narrative:   Patient who is stable for d/c will return home with [facility] services once we have confirmation of delivery of DME.  [facility] will provide RN and PT services.  [facility], after some initial confusion, has confirmed delivery of DME hospital bed, tub bench and bedside commode to the home, likely somewhere between 8 and 9PM tonight. TOC will continue to follow during the course of hospitalization.  Addendum: [place] to make outpatient palliative referral.     Final next level of care: Home w Home Health Services Barriers to Discharge: Other (must enter comment) (home delivery of dme)   Patient Goals and CMS Choice        Discharge Placement                       Discharge Plan and Services                                     Social Determinants of Health (SDOH) Interventions     Readmission Risk Interventions No flowsheet data found.

## 2021-03-09 NOTE — Progress Notes (Signed)
[place]  [phone] Fax:([phone]) [phone]   MEDICAL ONCOLOGY - INITIAL CONSULTATION  Referral MD Dr Rowe Pavy   Reason for Referral: Discuss goals of care  Chief Complaint  Patient presents with   Abdominal Pain   HPI:   This is a very pleasant 76 year old female patient with past medical history significant for metastatic breast cancer, prior prognostics ER positive PR negative, HER2 negative was on Ibrance and palbociclib from November 2021 until most recently when she was admitted to the hospital with progression.  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  She underwent palliative craniospinal irradiation and was seen in the clinic couple weeks ago when we discussed about trying oral chemotherapy versus palliative care and hospice.  She most recently was admitted again abdominal pain prior to the hospitalization.  She was started on laxatives and proton pump inhibitors to cover for gastritis and she has been on dexamethasone.  She continues to need indwelling Foley catheter, failed a void trial.  At the time of my visit both sons were at bedside.  Patient clearly expresses desire to try more treatment despite poor prognosis.  She understands quality of life is clearly very important but she feels well, has been able to move around, has been eating better and would like to try some more treatment.  She is not ready for hospice at this time.  She denies any severe abdominal pain since she came to the hospital.  She is able to ambulate with some assistance.  She denies any change in her breathing.  Rest of the pertinent 10 point ROS reviewed and negative.  Past Medical History:  Diagnosis Date   Benign breast  cyst in female    Right breast   Cancer of left breast ([place]) 2010   Depression    History of blood transfusion    "related to tubal pregnancy"   Hypertension    PAD (peripheral artery disease) ([place])   :   Past Surgical History:  Procedure Laterality Date   ABDOMINAL HYSTERECTOMY  1980s   BREAST CYST EXCISION Right ~ 2005   BREAST LUMPECTOMY Left 11/23/2008   ECTOPIC PREGNANCY SURGERY  1966   LOWER EXTREMITY ANGIOGRAM  06/19/2017   LOWER EXTREMITY ANGIOGRAPHY N/A 06/19/2017   Procedure: LOWER EXTREMITY ANGIOGRAPHY;  Surgeon: Lorretta Harp, MD;  Location: [facility];  Service: Cardiovascular;  Laterality: N/A;   PERIPHERAL VASCULAR BALLOON ANGIOPLASTY Left 06/19/2017   SFA    PERIPHERAL VASCULAR BALLOON ANGIOPLASTY  06/19/2017   Procedure: PERIPHERAL VASCULAR BALLOON ANGIOPLASTY;  Surgeon: Lorretta Harp, MD;  Location: [facility];  Service: Cardiovascular;;  SFA left  :   Current Facility-Administered Medications  Medication Dose Route Frequency Provider Last Rate Last Admin   acetaminophen (TYLENOL) tablet 650 mg  650 mg Oral Q6H PRN Marcelyn Bruins, MD   650 mg at 03/08/21 1820   Or   acetaminophen (TYLENOL) suppository 650 mg  650 mg Rectal Q6H PRN Marcelyn Bruins, MD       anastrozole (ARIMIDEX) tablet 1 mg  1 mg Oral Daily Marcelyn Bruins, MD   1 mg at 03/08/21 1012   Chlorhexidine Gluconate Cloth 2 % PADS 6 each  6 each Topical Daily Regalado, Belkys  A, MD   6 each at 03/08/21 1207   cholecalciferol (VITAMIN D3) tablet 400 Units  400 Units Oral Daily Synetta Fail, MD   400 Units at 03/08/21 1012   dexamethasone (DECADRON) tablet 2 mg  2 mg Oral Q12H Ronny Bacon, PA-C   2 mg at 03/08/21 2115   docusate sodium (COLACE) capsule 100 mg  100 mg Oral BID Synetta Fail, MD   100 mg at 03/08/21 2115   enoxaparin (LOVENOX) injection 30 mg  30 mg Subcutaneous Q24H Synetta Fail, MD   30 mg at 03/08/21 2116   feeding  supplement (ENSURE ENLIVE / ENSURE PLUS) liquid 237 mL  237 mL Oral BID BM Synetta Fail, MD   237 mL at 03/08/21 1529   fluocinonide cream (LIDEX) 0.05 % 1 application  1 application Topical BID PRN Synetta Fail, MD       folic acid (FOLVITE) tablet 1 mg  1,000 mcg Oral Daily Synetta Fail, MD   1 mg at 03/08/21 1012   lidocaine (LIDODERM) 5 % 1 patch  1 patch Transdermal Daily Synetta Fail, MD   1 patch at 03/08/21 1013   magnesium hydroxide (MILK OF MAGNESIA) suspension 30 mL  30 mL Oral Daily PRN Synetta Fail, MD       mirtazapine (REMERON) tablet 7.5 mg  7.5 mg Oral QHS Synetta Fail, MD   7.5 mg at 03/08/21 2114   montelukast (SINGULAIR) tablet 10 mg  10 mg Oral QHS Synetta Fail, MD   10 mg at 03/08/21 2114   multivitamin with minerals tablet   Oral Q breakfast Synetta Fail, MD   1 tablet at 03/08/21 1012   ondansetron (ZOFRAN) tablet 4 mg  4 mg Oral Q6H PRN Synetta Fail, MD       Or   ondansetron [facility]) injection 4 mg  4 mg Intravenous Q6H PRN Synetta Fail, MD   4 mg at 03/06/21 1713   oxyCODONE-acetaminophen (PERCOCET/ROXICET) 5-325 MG per tablet 1 tablet  1 tablet Oral Q6H PRN Synetta Fail, MD   1 tablet at 03/08/21 2115   pantoprazole (PROTONIX) EC tablet 40 mg  40 mg Oral BID Regalado, Belkys A, MD   40 mg at 03/08/21 2114   polyethylene glycol (MIRALAX / GLYCOLAX) packet 17 g  17 g Oral BID Regalado, Belkys A, MD   17 g at 03/08/21 2115   senna-docusate (Senokot-S) tablet 1 tablet  1 tablet Oral BID Regalado, Belkys A, MD   1 tablet at 03/08/21 2114   sodium chloride flush (NS) 0.9 % injection 3 mL  3 mL Intravenous Q12H Synetta Fail, MD   3 mL at 03/08/21 2116   sodium phosphate (FLEET) 7-19 GM/118ML enema 1 enema  1 enema Rectal Once PRN Synetta Fail, MD       sorbitol 70 % solution 30 mL  30 mL Oral Daily PRN Synetta Fail, MD       triamcinolone cream (KENALOG) 0.1 % cream 1 application   1 application Topical Daily PRN Synetta Fail, MD       vitamin E capsule 400 Units  400 Units Oral Daily Synetta Fail, MD   400 Units at 03/08/21 1012      Allergies  Allergen Reactions   Aspirin Nausea And Vomiting   Cetirizine Other (See Comments)    Chest Pain    Rosuvastatin Other (See Comments)  MD suspended this due to bone pain  :   Family History  Problem Relation Age of Onset   Hypertension Mother    Seizures Mother    Heart disease Father    Hypertension Sister   :   Social History   Socioeconomic History   Marital status: Widowed    Spouse name: Not on file   Number of children: Not on file   Years of education: Not on file   Highest education level: Not on file  Occupational History   Not on file  Tobacco Use   Smoking status: Every Day    Packs/day: 0.50    Years: 45.00    Pack years: 22.50    Types: Cigarettes   Smokeless tobacco: Never  Vaping Use   Vaping Use: Never used  Substance and Sexual Activity   Alcohol use: Yes    Comment: 06/19/2017 "glass of wine 3-4 times/year"   Drug use: Never   Sexual activity: Not Currently    Birth control/protection: Post-menopausal, Surgical  Other Topics Concern   Not on file  Social History Narrative   Not on file   Social Determinants of Health   Financial Resource Strain: Not on file  Food Insecurity: Not on file  Transportation Needs: Not on file  Physical Activity: Not on file  Stress: Not on file  Social Connections: Not on file  Intimate Partner Violence: Not on file  :   Exam: Patient Vitals for the past 24 hrs:  BP Temp Temp src Pulse Resp SpO2  03/09/21 0359 122/63 98.4 F (36.9 C) Oral 83 16 93 %  03/08/21 2006 122/62 98.1 F (36.7 C) Oral 79 20 95 %    General:  well-nourished in no acute distress.  Rest of the physical exam deferred in lieu of counseling.  Lab Results  Component Value Date   WBC 5.0 03/05/2021   HGB 13.6 03/05/2021   HCT 39.5 03/05/2021    PLT 130 (L) 03/05/2021   GLUCOSE 132 (H) 03/08/2021   CHOL 195 10/06/2018   TRIG 124 10/06/2018   HDL 57 10/06/2018   LDLCALC 116 (H) 10/06/2018   ALT 67 (H) 03/04/2021   AST 40 03/04/2021   NA 134 (L) 03/08/2021   K 3.5 03/08/2021   CL 105 03/08/2021   CREATININE 0.55 03/08/2021   BUN 10 03/08/2021   CO2 22 03/08/2021    CT HEAD WO CONTRAST (5MM)  Result Date: 02/08/2021 CLINICAL DATA:  Study prior to lumbar puncture EXAM: CT HEAD WITHOUT CONTRAST TECHNIQUE: Contiguous axial images were obtained from the base of the skull through the vertex without intravenous contrast. RADIATION DOSE REDUCTION: This exam was performed according to the departmental dose-optimization program which includes automated exposure control, adjustment of the mA and/or kV according to patient size and/or use of iterative reconstruction technique. COMPARISON:  MR brain done on 09/13/2009 FINDINGS: Brain: No acute intracranial findings are seen. Ventricles are not dilated. Cortical sulci are prominent. There are no epidural or subdural fluid collections. There is decreased density in the periventricular white matter. Vascular: Unremarkable. Skull: Unremarkable. Sinuses/Orbits: Visualized paranasal sinuses are unremarkable. There is fluid in the right mastoid air cells. There is no break in the cortical margins in the right mastoid. Other: None IMPRESSION: No acute intracranial findings are seen. Atrophy. Small-vessel disease. There is fluid density in the right mastoid air cells suggesting right mastoid effusion or chronic right mastoiditis. Electronically Signed   By: Elmer Picker M.D.   On:  02/08/2021 12:43   CT CHEST W CONTRAST  Result Date: 02/08/2021 CLINICAL DATA:  Restaging metastatic breast cancer. EXAM: CT CHEST, ABDOMEN, AND PELVIS WITH CONTRAST TECHNIQUE: Multidetector CT imaging of the chest, abdomen and pelvis was performed following the standard protocol during bolus administration of intravenous  contrast. RADIATION DOSE REDUCTION: This exam was performed according to the departmental dose-optimization program which includes automated exposure control, adjustment of the mA and/or kV according to patient size and/or use of iterative reconstruction technique. CONTRAST:  62mL OMNIPAQUE IOHEXOL 300 MG/ML  SOLN COMPARISON:  Chest CT 01/03/2021. Abdominal/pelvic CT scan 08/14/2018 FINDINGS: CT CHEST FINDINGS Cardiovascular: The heart is normal in size. No pericardial effusion. The aorta is normal in caliber. No dissection. Stable aortic and coronary artery calcifications. Mediastinum/Nodes: No mediastinal or hilar mass or lymphadenopathy. The esophagus is grossly normal. Lungs/Pleura: Stable emphysematous changes and areas of pulmonary scarring. Small scattered perifissural nodules consistent with small lymph nodes. No worrisome pulmonary lesions to suggest pulmonary metastatic disease. There are bilateral pleural effusions, left larger than right with overlying atelectasis left greater than right. No obvious enhancing pleural nodules. Musculoskeletal: Overall stable appearing severe diffuse metastatic bone disease with mixed lytic and sclerotic changes and old pathologic fractures. I do not see any obvious new or progressive findings. The left paraspinal muscles are thickened and demonstrate heterogeneous enhancement findings suspicious for tumor involvement. CT ABDOMEN PELVIS FINDINGS Hepatobiliary: No hepatic lesions to suggest metastatic disease. The gallbladder is unremarkable. No intra or extrahepatic biliary dilatation. Pancreas: No mass, inflammation or ductal dilatation. Spleen: Normal size.  No focal lesions. Adrenals/Urinary Tract: The adrenal glands and kidneys are unremarkable and stable. Small renal cysts are noted. No worrisome renal lesions. The bladder is decompressed by a Foley catheter. Stomach/Bowel: Stomach, duodenum, small bowel and colon are grossly normal. Vascular/Lymphatic: Stable  advanced atherosclerotic calcifications involving the aorta and iliac arteries but no aneurysm. Stable small scattered mesenteric and retroperitoneal lymph nodes but no mass or adenopathy. No findings suspicious for peritoneal carcinomatosis. Reproductive: Surgically absent. Other: No free air or free fluid. 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 There is a large lytic lesion involving the right acetabulum but I do not see a definite pathologic fracture. Lytic lesion involving the upper right iliac bone with destruction of the medial cortex. No obvious pathologic hip fracture. Large enhancing masslike area in the left gluteus medius muscle worrisome for a muscle involvement, likely from direct extension. Other areas of abnormal muscle enhancement could suggest muscle lesions. IMPRESSION: 1. 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 2. Several areas of masslike enhancement in the muscles of the spine and pelvis worrisome for tumor involvement. 3. No findings suspicious for pulmonary metastatic disease. 4. Bilateral pleural effusions, left larger than right with overlying atelectasis left greater than right. 5. No findings suspicious for abdominal/pelvic metastatic disease. 6. Stable advanced atherosclerotic calcifications involving the thoracic and abdominal aorta and iliac arteries. Aortic Atherosclerosis (ICD10-I70.0) and Emphysema (ICD10-J43.9). Electronically Signed   By: Marijo Sanes M.D.   On: 02/08/2021 19:23   MR BRAIN W WO CONTRAST  Result Date: 02/09/2021 CLINICAL DATA:  Restaging metastatic cancer, history of breast cancer with widespread osseous metastatic disease EXAM: MRI HEAD WITHOUT AND WITH CONTRAST MRI CERVICAL SPINE WITHOUT AND WITH CONTRAST TECHNIQUE: Multiplanar, multiecho pulse sequences of the brain and surrounding structures,  and cervical spine, to include the craniocervical  junction and cervicothoracic junction, were obtained without and with intravenous contrast. CONTRAST:  28mL GADAVIST GADOBUTROL 1 MMOL/ML IV SOLN COMPARISON:  09/13/2009 MRI head, correlation is also made with 02/08/2021 CT head; no prior MRI of the cervical spine, correlation is made with 02/07/2021 MRI thoracic spine FINDINGS: MRI HEAD FINDINGS Brain: No restricted diffusion to suggest acute or subacute infarct. No acute hemorrhage, mass, mass effect, or midline shift. Leptomeningeal enhancement in the anterior temporal lobes (series 20, images 23 and 22), posterior left temporal lobe (series 20, image 22), along the anterior surface of the midbrain (series 28, image 25) and pons (series 30, image 12), and in the cerebellar folia (series 28, images 17-25). Enhancement of the medulla and brainstem are better seen on the cervical spine images (series 26, image 7). No hydrocephalus or extra-axial collection. Scattered T2 hyperintense signal in the periventricular white matter, likely the sequela of mild chronic small vessel ischemic disease. No cerebral edema. Vascular: Normal flow voids. Skull and upper cervical spine: In the midline/left parietal calvarium, there is a 10 x 5 x 8 mm mildly enhancing lesion (series 28, image 35 and series 30, image 14). Otherwise normal marrow signal. Sinuses/Orbits: No acute finding. Status post bilateral lens replacements. Other: Fluid throughout the right mastoid air cells. MRI CERVICAL SPINE FINDINGS Evaluation is somewhat limited by motion artifact. Alignment: Straightening of the normal cervical lordosis. No listhesis. Vertebrae: Abnormal T1 signal with patchy enhancement, for example in C2 and C6 consistent with known extensive osseous metastatic disease. No evidence of pathologic fracture. For findings in the thoracic spine, see 02/07/2021 MRI thoracic spine. Cord: Leptomeningeal enhancement along the brainstem and majority of  the cervical spinal cord (series 26, image 7 and series 27, image 13). The spinal cord demonstrates increased T2 signal centrally from the level of C2 and extending inferiorly throughout the imaged spinal cord, although this is most prominent from the level of C5 extending inferiorly (series 25, image 7 and series 22, image 17). From C6 through C7-T1, there also appears to be prominence of the central canal of the spinal cord (series 22, images 21-27). Posterior Fossa, vertebral arteries, paraspinal tissues: For findings related to the pons and cerebellum, please see MRI brain findings above. Disc levels: Degenerative changes without significant spinal canal stenosis. IMPRESSION: MRI BRAIN: 1. 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 2. No abnormal parenchymal enhancement to suggest parenchymal metastatic disease. 3. Focus of enhancement in the left parietal calvarium is favored to be benign. Attention on follow-up. MRI CERVICAL SPINE 1. Leptomeningeal enhancement along the brainstem and the majority of the cervical spinal cord, with increased T2 signal in the cervical spinal cord, most likely edema. In addition, there may be prominence of the central canal from C6 through C7-T1. 2. Known osseous metastatic disease, without evidence of pathologic fracture. Electronically Signed   By: Merilyn Baba M.D.   On: 02/09/2021 14:28   MR CERVICAL SPINE W WO CONTRAST  Result Date: 02/09/2021 CLINICAL DATA:  Restaging metastatic cancer, history of breast cancer with widespread osseous metastatic disease EXAM: MRI HEAD WITHOUT AND WITH CONTRAST MRI CERVICAL SPINE WITHOUT AND WITH CONTRAST TECHNIQUE: Multiplanar, multiecho pulse sequences of the brain and surrounding structures, and cervical spine, to include the craniocervical junction and cervicothoracic junction, were obtained without and  with intravenous contrast. CONTRAST:  77mL GADAVIST GADOBUTROL 1 MMOL/ML IV SOLN COMPARISON:  09/13/2009 MRI head, correlation is also made  with 02/08/2021 CT head; no prior MRI of the cervical spine, correlation is made with 02/07/2021 MRI thoracic spine FINDINGS: MRI HEAD FINDINGS Brain: No restricted diffusion to suggest acute or subacute infarct. No acute hemorrhage, mass, mass effect, or midline shift. Leptomeningeal enhancement in the anterior temporal lobes (series 20, images 23 and 22), posterior left temporal lobe (series 20, image 22), along the anterior surface of the midbrain (series 28, image 25) and pons (series 30, image 12), and in the cerebellar folia (series 28, images 17-25). Enhancement of the medulla and brainstem are better seen on the cervical spine images (series 26, image 7). No hydrocephalus or extra-axial collection. Scattered T2 hyperintense signal in the periventricular white matter, likely the sequela of mild chronic small vessel ischemic disease. No cerebral edema. Vascular: Normal flow voids. Skull and upper cervical spine: In the midline/left parietal calvarium, there is a 10 x 5 x 8 mm mildly enhancing lesion (series 28, image 35 and series 30, image 14). Otherwise normal marrow signal. Sinuses/Orbits: No acute finding. Status post bilateral lens replacements. Other: Fluid throughout the right mastoid air cells. MRI CERVICAL SPINE FINDINGS Evaluation is somewhat limited by motion artifact. Alignment: Straightening of the normal cervical lordosis. No listhesis. Vertebrae: Abnormal T1 signal with patchy enhancement, for example in C2 and C6 consistent with known extensive osseous metastatic disease. No evidence of pathologic fracture. For findings in the thoracic spine, see 02/07/2021 MRI thoracic spine. Cord: Leptomeningeal enhancement along the brainstem and majority of the cervical spinal cord (series 26, image 7 and series 27, image 13). The spinal cord demonstrates increased T2  signal centrally from the level of C2 and extending inferiorly throughout the imaged spinal cord, although this is most prominent from the level of C5 extending inferiorly (series 25, image 7 and series 22, image 17). From C6 through C7-T1, there also appears to be prominence of the central canal of the spinal cord (series 22, images 21-27). Posterior Fossa, vertebral arteries, paraspinal tissues: For findings related to the pons and cerebellum, please see MRI brain findings above. Disc levels: Degenerative changes without significant spinal canal stenosis. IMPRESSION: MRI BRAIN: 1. 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 2. No abnormal parenchymal enhancement to suggest parenchymal metastatic disease. 3. Focus of enhancement in the left parietal calvarium is favored to be benign. Attention on follow-up. MRI CERVICAL SPINE 1. Leptomeningeal enhancement along the brainstem and the majority of the cervical spinal cord, with increased T2 signal in the cervical spinal cord, most likely edema. In addition, there may be prominence of the central canal from C6 through C7-T1. 2. Known osseous metastatic disease, without evidence of pathologic fracture. Electronically Signed   By: Merilyn Baba M.D.   On: 02/09/2021 14:28   MR THORACIC SPINE W WO CONTRAST  Result Date: 02/07/2021 CLINICAL DATA:  Back pain. Difficulty with urination. History of breast cancer with widespread osseous metastatic disease. EXAM: MRI THORACIC AND LUMBAR SPINE WITHOUT AND WITH CONTRAST TECHNIQUE: Multiplanar and multiecho pulse sequences of the thoracic and lumbar spine were obtained without and with intravenous contrast. CONTRAST:  50mL GADAVIST GADOBUTROL 1 MMOL/ML IV SOLN COMPARISON:  Lumbar spine radiographs 02/05/2021. Nuclear medicine whole-body bone scan 01/03/2021. Chest CT 01/03/2021. Lumbar spine MRI  10/04/2019. FINDINGS: MRI THORACIC SPINE FINDINGS The study is moderately motion degraded, greatest on axial sequences. Alignment: Mild lower thoracic dextroscoliosis. No significant listhesis. Vertebrae: Known widespread metastatic disease throughout the vertebral  bodies and posterior elements at every thoracic level as well as in the included cervical spine. Pathologic T4 compression fracture with mild vertebral body height loss, similar to the prior chest CT. A T9 superior endplate Schmorl's node is unchanged. There is evidence of epidural tumor in the lower thoracic spine, greatest at T10-11 where there is resultant moderate spinal stenosis. The craniocaudal extent of epidural tumor is difficult to precisely determine although it may extend cranially to approximately T7-8. Cord: Abnormal enhancement along the surface of the spinal cord at least throughout the mid and upper thoracic spine with extension towards the cervical region. Abnormal T2 hyperintensity centrally in the upper and, to a lesser extent, mid thoracic spinal cord which could reflect edema or a syrinx extending towards the cervical region and measuring up to approximately 4 mm in diameter near the cervicothoracic junction. Additional edema in the lower thoracic spinal cord. Paraspinal and other soft tissues: Small left pleural effusion, larger than on the prior chest CT. Widespread STIR hyperintensity and enhancement of the left greater than right posterior paraspinal musculature. Disc levels: Disc degeneration greatest at T10-11 and T11-12 where there is asymmetrically severe left-sided disc space narrowing. Up to moderate lower thoracic spinal stenosis due to epidural tumor. MRI LUMBAR SPINE FINDINGS Segmentation:  Standard. Alignment: Moderate lumbar levoscoliosis. No significant listhesis. Vertebrae: Chronic L4 Schmorl's nodes and mild chronic L3 compression fracture. Known widespread osseous metastatic disease throughout the lumbar spine and  included pelvis. Epidural tumor at the thoracolumbar junction as noted above on the thoracic spine MRI. Mildly prominent epidural enhancement throughout the lumbar spine more caudally, indeterminate for epidural tumor but without a dominant epidural mass in the lumbar spine. Epidural tumor is suspected in the sacrum involving left-sided neural foramina. Prominent perineural cysts at multiple levels as previously seen. Conus medullaris: Extends to the T12-L1 level and demonstrates edema as noted above. There is abnormal nodular enhancement along the surface of the distal spinal cord/conus, and there also appears to be mild enhancement of cauda equina nerve roots in the lower lumbar and sacral canal. Paraspinal and other soft tissues: Chronic asymmetric left psoas and left greater than right posterior paraspinal muscle atrophy. Partially visualized extensive gluteal musculature enhancement bilaterally. Small right renal cyst. Disc levels: L1-2: Disc bulging and mild facet and ligamentum flavum hypertrophy result in moderate right lateral recess stenosis and moderate right and mild left neural foraminal stenosis, similar to the prior MRI. Borderline to mild spinal stenosis. L2-3: Disc bulging and moderate facet and ligamentum flavum hypertrophy result in mild right lateral recess stenosis and severe right neural foraminal stenosis, similar to the prior MRI. Borderline spinal stenosis. L3-4: Right eccentric disc bulging and mild-to-moderate facet and ligamentum flavum hypertrophy result in borderline to mild right lateral recess stenosis and mild right neural foraminal stenosis without spinal stenosis, similar to the prior MRI. L4-5: Minimal disc bulging without stenosis. L5-S1: Negative. IMPRESSION: 1. Known widespread metastatic disease throughout the thoracic and lumbar spine. 2. 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 3. Abnormal enhancement relatively diffusely  along the surface of the thoracic spinal cord with suspected mild involvement of the cauda equina nerve roots compatible with left meningeal disease. 4. Edema in the distal spinal cord/conus as well as edema and/or syrinx in the upper thoracic cord extending into the cervical region. 5. Widespread edema/enhancement involving the thoracic paraspinal musculature and bilateral gluteal musculature, potentially denervation or rhabdomyolysis. 6. Small left pleural effusion. Electronically Signed  By: Logan Bores M.D.   On: 02/07/2021 16:11   MR Lumbar Spine W Wo Contrast  Result Date: 02/07/2021 CLINICAL DATA:  Back pain. Difficulty with urination. History of breast cancer with widespread osseous metastatic disease. EXAM: MRI THORACIC AND LUMBAR SPINE WITHOUT AND WITH CONTRAST TECHNIQUE: Multiplanar and multiecho pulse sequences of the thoracic and lumbar spine were obtained without and with intravenous contrast. CONTRAST:  87mL GADAVIST GADOBUTROL 1 MMOL/ML IV SOLN COMPARISON:  Lumbar spine radiographs 02/05/2021. Nuclear medicine whole-body bone scan 01/03/2021. Chest CT 01/03/2021. Lumbar spine MRI 10/04/2019. FINDINGS: MRI THORACIC SPINE FINDINGS The study is moderately motion degraded, greatest on axial sequences. Alignment: Mild lower thoracic dextroscoliosis. No significant listhesis. Vertebrae: Known widespread metastatic disease throughout the vertebral bodies and posterior elements at every thoracic level as well as in the included cervical spine. Pathologic T4 compression fracture with mild vertebral body height loss, similar to the prior chest CT. A T9 superior endplate Schmorl's node is unchanged. There is evidence of epidural tumor in the lower thoracic spine, greatest at T10-11 where there is resultant moderate spinal stenosis. The craniocaudal extent of epidural tumor is difficult to precisely determine although it may extend cranially to approximately T7-8. Cord: Abnormal enhancement along the  surface of the spinal cord at least throughout the mid and upper thoracic spine with extension towards the cervical region. Abnormal T2 hyperintensity centrally in the upper and, to a lesser extent, mid thoracic spinal cord which could reflect edema or a syrinx extending towards the cervical region and measuring up to approximately 4 mm in diameter near the cervicothoracic junction. Additional edema in the lower thoracic spinal cord. Paraspinal and other soft tissues: Small left pleural effusion, larger than on the prior chest CT. Widespread STIR hyperintensity and enhancement of the left greater than right posterior paraspinal musculature. Disc levels: Disc degeneration greatest at T10-11 and T11-12 where there is asymmetrically severe left-sided disc space narrowing. Up to moderate lower thoracic spinal stenosis due to epidural tumor. MRI LUMBAR SPINE FINDINGS Segmentation:  Standard. Alignment: Moderate lumbar levoscoliosis. No significant listhesis. Vertebrae: Chronic L4 Schmorl's nodes and mild chronic L3 compression fracture. Known widespread osseous metastatic disease throughout the lumbar spine and included pelvis. Epidural tumor at the thoracolumbar junction as noted above on the thoracic spine MRI. Mildly prominent epidural enhancement throughout the lumbar spine more caudally, indeterminate for epidural tumor but without a dominant epidural mass in the lumbar spine. Epidural tumor is suspected in the sacrum involving left-sided neural foramina. Prominent perineural cysts at multiple levels as previously seen. Conus medullaris: Extends to the T12-L1 level and demonstrates edema as noted above. There is abnormal nodular enhancement along the surface of the distal spinal cord/conus, and there also appears to be mild enhancement of cauda equina nerve roots in the lower lumbar and sacral canal. Paraspinal and other soft tissues: Chronic asymmetric left psoas and left greater than right posterior paraspinal  muscle atrophy. Partially visualized extensive gluteal musculature enhancement bilaterally. Small right renal cyst. Disc levels: L1-2: Disc bulging and mild facet and ligamentum flavum hypertrophy result in moderate right lateral recess stenosis and moderate right and mild left neural foraminal stenosis, similar to the prior MRI. Borderline to mild spinal stenosis. L2-3: Disc bulging and moderate facet and ligamentum flavum hypertrophy result in mild right lateral recess stenosis and severe right neural foraminal stenosis, similar to the prior MRI. Borderline spinal stenosis. L3-4: Right eccentric disc bulging and mild-to-moderate facet and ligamentum flavum hypertrophy result in borderline to mild right lateral  recess stenosis and mild right neural foraminal stenosis without spinal stenosis, similar to the prior MRI. L4-5: Minimal disc bulging without stenosis. L5-S1: Negative. IMPRESSION: 1. Known widespread metastatic disease throughout the thoracic and lumbar spine. 2. 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 3. Abnormal enhancement relatively diffusely along the surface of the thoracic spinal cord with suspected mild involvement of the cauda equina nerve roots compatible with left meningeal disease. 4. Edema in the distal spinal cord/conus as well as edema and/or syrinx in the upper thoracic cord extending into the cervical region. 5. Widespread edema/enhancement involving the thoracic paraspinal musculature and bilateral gluteal musculature, potentially denervation or rhabdomyolysis. 6. Small left pleural effusion. Electronically Signed   By: Logan Bores M.D.   On: 02/07/2021 16:11   CT Abdomen Pelvis W Contrast  Result Date: 03/04/2021 CLINICAL DATA:  Abdominal pain, metastatic breast carcinoma EXAM: CT ABDOMEN AND PELVIS WITH CONTRAST TECHNIQUE: Multidetector CT imaging of the abdomen and pelvis was performed using the standard protocol following bolus  administration of intravenous contrast. RADIATION DOSE REDUCTION: This exam was performed according to the departmental dose-optimization program which includes automated exposure control, adjustment of the mA and/or kV according to patient size and/or use of iterative reconstruction technique. CONTRAST:  161mL OMNIPAQUE IOHEXOL 300 MG/ML  SOLN COMPARISON:  03/02/2021 FINDINGS: Lower chest: Small to moderate bilateral pleural effusions are seen, more so on the left side. There are patchy infiltrates in the posterior aspects of both lower lung fields with interval worsening. Hepatobiliary: No focal abnormality is seen in the liver. There is subtle increased density in lumen of gallbladder, possibly due to vicarious excretion of iodinated contrast from previous CT. There is no wall thickening in gallbladder. Pancreas: No focal abnormality is seen. There is prominence of pancreatic duct in the head and body. Spleen: Spleen appears smaller than usual in size. Adrenals/Urinary Tract: Adrenals are unremarkable. There is no hydronephrosis. There are no renal or ureteral stones. There is 1.6 cm smooth marginated fluid density lesion in the midportion of left kidney, possibly a cyst urinary bladder is not distended. Foley catheter is seen in the bladder. Stomach/Bowel: Stomach is unremarkable. Small bowel loops are not dilated. Appendix is not distinctly seen. There is no focal pericecal inflammation. There is no significant wall thickening in the colon. There is no pericolic stranding or fluid collection. Vascular/Lymphatic: Arterial calcifications are seen in the aorta and its major branches. Reproductive: Uterus is not seen. There are no dominant adnexal masses. Other: There is possible trace amount of free fluid in the right side of pelvis. There is no pneumoperitoneum. Small right inguinal hernia containing fat is seen. There is subcutaneous edema in the abdominal wall. Musculoskeletal: There is patchy enhancement in  the paraspinal muscles, right iliacus muscle and gluteal muscles on both sides, more so on the left side. Left psoas muscle appears smaller than usual. These findings have not changed significantly. There are numerous lytic lesions of varying sizes in the skeletal structures consistent with skeletal metastatic disease. IMPRESSION: There is no evidence of intestinal obstruction or pneumoperitoneum. There is no hydronephrosis. Small to moderate bilateral pleural effusions, more so on the left side. There are patchy infiltrates in both lower lung fields with possible slight worsening suggesting atelectasis/pneumonia. Extensive skeletal metastatic disease. There is abnormal patchy enhancement in multiple muscles suggesting possible active inflammatory or neoplastic process. Other findings as described in the body of the report. Extensive skeletal metastatic disease. There is patchy enhancement  in the multiple muscles as described in the body of the report which may suggest inflammatory or neoplastic process in the soft tissues. Electronically Signed   By: Elmer Picker M.D.   On: 03/04/2021 17:10   CT ABDOMEN PELVIS W CONTRAST  Result Date: 03/02/2021 CLINICAL DATA:  Abdomen pain EXAM: CT ABDOMEN AND PELVIS WITH CONTRAST TECHNIQUE: Multidetector CT imaging of the abdomen and pelvis was performed using the standard protocol following bolus administration of intravenous contrast. RADIATION DOSE REDUCTION: This exam was performed according to the departmental dose-optimization program which includes automated exposure control, adjustment of the mA and/or kV according to patient size and/or use of iterative reconstruction technique. CONTRAST:  116mL OMNIPAQUE IOHEXOL 300 MG/ML  SOLN COMPARISON:  CT 08/14/2018, CT 02/08/2021 FINDINGS: Lower chest: Small right and small moderate left pleural effusions. Normal cardiac size. Partial consolidations in the lower lobes likely due to atelectasis. Hepatobiliary: No focal  liver abnormality is seen. No gallstones, gallbladder wall thickening, or biliary dilatation. Pancreas: Unremarkable. No pancreatic ductal dilatation or surrounding inflammatory changes. Spleen: Normal in size without focal abnormality. Adrenals/Urinary Tract: Adrenal glands are within normal limits. No hydronephrosis. Cyst midpole left kidney. Foley catheter in the bladder. Stomach/Bowel: The stomach is nonenlarged. No dilated small bowel. No acute bowel wall thickening. Large stool burden. Vascular/Lymphatic: Advanced aortic atherosclerosis. No aneurysm. No increasing adenopathy. Reproductive: Status post hysterectomy. No adnexal masses. Other: Negative for pelvic effusion or free air. Musculoskeletal: Heterogenous masslike areas of muscle enhancement involving the gluteal regions, right anterior hip muscles, right iliacus muscle, and the paraspinal musculature. Extensive lytic and sclerotic lesions consistent with widespread skeletal metastatic disease. IMPRESSION: 1. Negative for bowel obstruction or bowel wall thickening. Large stool burden suggesting constipation. 2. Small moderate left and small right-sided pleural effusion with probable passive atelectasis at the left greater than right lower lobes 3. 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 Extensive heterogeneous masslike enhancement involving the gluteal muscles, paraspinal muscles, and right hip muscles concerning for soft tissue metastatic disease. Electronically Signed   By: Donavan Foil M.D.   On: 03/02/2021 22:29   CT ABDOMEN PELVIS W CONTRAST  Result Date: 02/08/2021 CLINICAL DATA:  Restaging metastatic breast cancer. EXAM: CT CHEST, ABDOMEN, AND PELVIS WITH CONTRAST TECHNIQUE: Multidetector CT imaging of the chest, abdomen and pelvis was performed following the standard protocol during bolus administration of intravenous contrast. RADIATION DOSE REDUCTION: This exam was performed according to the departmental dose-optimization program  which includes automated exposure control, adjustment of the mA and/or kV according to patient size and/or use of iterative reconstruction technique. CONTRAST:  34mL OMNIPAQUE IOHEXOL 300 MG/ML  SOLN COMPARISON:  Chest CT 01/03/2021. Abdominal/pelvic CT scan 08/14/2018 FINDINGS: CT CHEST FINDINGS Cardiovascular: The heart is normal in size. No pericardial effusion. The aorta is normal in caliber. No dissection. Stable aortic and coronary artery calcifications. Mediastinum/Nodes: No mediastinal or hilar mass or lymphadenopathy. The esophagus is grossly normal. Lungs/Pleura: Stable emphysematous changes and areas of pulmonary scarring. Small scattered perifissural nodules consistent with small lymph nodes. No worrisome pulmonary lesions to suggest pulmonary metastatic disease. There are bilateral pleural effusions, left larger than right with overlying atelectasis left greater than right. No obvious enhancing pleural nodules. Musculoskeletal: Overall stable appearing severe diffuse metastatic bone disease with mixed lytic and sclerotic changes and old pathologic fractures. I do not see any obvious new or progressive findings. The left paraspinal muscles are thickened and demonstrate heterogeneous enhancement findings suspicious for tumor involvement. CT ABDOMEN PELVIS FINDINGS Hepatobiliary: No hepatic lesions to  suggest metastatic disease. The gallbladder is unremarkable. No intra or extrahepatic biliary dilatation. Pancreas: No mass, inflammation or ductal dilatation. Spleen: Normal size.  No focal lesions. Adrenals/Urinary Tract: The adrenal glands and kidneys are unremarkable and stable. Small renal cysts are noted. No worrisome renal lesions. The bladder is decompressed by a Foley catheter. Stomach/Bowel: Stomach, duodenum, small bowel and colon are grossly normal. Vascular/Lymphatic: Stable advanced atherosclerotic calcifications involving the aorta and iliac arteries but no aneurysm. Stable small scattered  mesenteric and retroperitoneal lymph nodes but no mass or adenopathy. No findings suspicious for peritoneal carcinomatosis. Reproductive: Surgically absent. Other: No free air or free fluid. 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 There is a large lytic lesion involving the right acetabulum but I do not see a definite pathologic fracture. Lytic lesion involving the upper right iliac bone with destruction of the medial cortex. No obvious pathologic hip fracture. Large enhancing masslike area in the left gluteus medius muscle worrisome for a muscle involvement, likely from direct extension. Other areas of abnormal muscle enhancement could suggest muscle lesions. IMPRESSION: 1. 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 2. Several areas of masslike enhancement in the muscles of the spine and pelvis worrisome for tumor involvement. 3. No findings suspicious for pulmonary metastatic disease. 4. Bilateral pleural effusions, left larger than right with overlying atelectasis left greater than right. 5. No findings suspicious for abdominal/pelvic metastatic disease. 6. Stable advanced atherosclerotic calcifications involving the thoracic and abdominal aorta and iliac arteries. Aortic Atherosclerosis (ICD10-I70.0) and Emphysema (ICD10-J43.9). Electronically Signed   By: Marijo Sanes M.D.   On: 02/08/2021 19:23   DG Chest Portable 1 View  Result Date: 02/07/2021 CLINICAL DATA:  Shortness of breath, difficulty urinating EXAM: PORTABLE CHEST 1 VIEW COMPARISON:  10/11/2020 FINDINGS: Mild cardiomegaly. Mild, diffuse bilateral interstitial pulmonary opacity. Small right pleural effusion. Unchanged elevation of the left hemidiaphragm with associated atelectasis or consolidation. The visualized skeletal structures are unremarkable. IMPRESSION: 1. Mild, diffuse bilateral  interstitial pulmonary opacity, most consistent with edema. No focal airspace opacity. 2. Small right pleural effusion. 3. Unchanged elevation of the left hemidiaphragm with associated atelectasis or consolidation. Electronically Signed   By: Delanna Ahmadi M.D.   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 EXAM: DIAGNOSTIC LUMBAR PUNCTURE UNDER FLUOROSCOPIC GUIDANCE COMPARISON:  Spine MRI 02/07/2021. Today's head CT, dictated separately. FLUOROSCOPY TIME:  Fluoroscopy Time:  2 minutes and 48 seconds Radiation Exposure Index (if provided by the fluoroscopic device): 14.9 mGy Number of Acquired Spot Images: 0 PROCEDURE: Informed consent was obtained from the patient prior to the procedure, including potential complications of headache, allergy, and pain. With the patient prone, the lower back was prepped with Betadine. 1% Lidocaine was used for local anesthesia. Lumbar puncture was performed at the L4-5 level using a 20 gauge needle with return of clear CSF with an opening pressure of 9 cm water. After obtaining 2 vials each with 2 cc of clear CSF, the return of CSF became sanguinous. 1 cc of sanguinous CSF was obtained. At this point, the tubing was removed and the stylette placed in order to attempt to clear the CSF flow. Despite multiple maneuvers, CSF return could not be obtained a second time. The patient tolerated the procedure well and there were no apparent  complications. IMPRESSION: Successful lumbar puncture, as detailed above. Only a total of 5 cc of CSF were obtained, secondary to the CSF flow becoming sanguinous. Electronically Signed   By: Abigail Miyamoto M.D.   On: 02/08/2021 13:58     CT HEAD WO CONTRAST (5MM)  Result Date: 02/08/2021 CLINICAL DATA:  Study prior to lumbar puncture EXAM: CT HEAD WITHOUT CONTRAST TECHNIQUE: Contiguous axial images were obtained from the base of the skull  through the vertex without intravenous contrast. RADIATION DOSE REDUCTION: This exam was performed according to the departmental dose-optimization program which includes automated exposure control, adjustment of the mA and/or kV according to patient size and/or use of iterative reconstruction technique. COMPARISON:  MR brain done on 09/13/2009 FINDINGS: Brain: No acute intracranial findings are seen. Ventricles are not dilated. Cortical sulci are prominent. There are no epidural or subdural fluid collections. There is decreased density in the periventricular white matter. Vascular: Unremarkable. Skull: Unremarkable. Sinuses/Orbits: Visualized paranasal sinuses are unremarkable. There is fluid in the right mastoid air cells. There is no break in the cortical margins in the right mastoid. Other: None IMPRESSION: No acute intracranial findings are seen. Atrophy. Small-vessel disease. There is fluid density in the right mastoid air cells suggesting right mastoid effusion or chronic right mastoiditis. Electronically Signed   By: Elmer Picker M.D.   On: 02/08/2021 12:43   CT CHEST W CONTRAST  Result Date: 02/08/2021 CLINICAL DATA:  Restaging metastatic breast cancer. EXAM: CT CHEST, ABDOMEN, AND PELVIS WITH CONTRAST TECHNIQUE: Multidetector CT imaging of the chest, abdomen and pelvis was performed following the standard protocol during bolus administration of intravenous contrast. RADIATION DOSE REDUCTION: This exam was performed according to the departmental dose-optimization program which includes automated exposure control, adjustment of the mA and/or kV according to patient size and/or use of iterative reconstruction technique. CONTRAST:  35mL OMNIPAQUE IOHEXOL 300 MG/ML  SOLN COMPARISON:  Chest CT 01/03/2021. Abdominal/pelvic CT scan 08/14/2018 FINDINGS: CT CHEST FINDINGS Cardiovascular: The heart is normal in size. No pericardial effusion. The aorta is normal in caliber. No dissection. Stable aortic and  coronary artery calcifications. Mediastinum/Nodes: No mediastinal or hilar mass or lymphadenopathy. The esophagus is grossly normal. Lungs/Pleura: Stable emphysematous changes and areas of pulmonary scarring. Small scattered perifissural nodules consistent with small lymph nodes. No worrisome pulmonary lesions to suggest pulmonary metastatic disease. There are bilateral pleural effusions, left larger than right with overlying atelectasis left greater than right. No obvious enhancing pleural nodules. Musculoskeletal: Overall stable appearing severe diffuse metastatic bone disease with mixed lytic and sclerotic changes and old pathologic fractures. I do not see any obvious new or progressive findings. The left paraspinal muscles are thickened and demonstrate heterogeneous enhancement findings suspicious for tumor involvement. CT ABDOMEN PELVIS FINDINGS Hepatobiliary: No hepatic lesions to suggest metastatic disease. The gallbladder is unremarkable. No intra or extrahepatic biliary dilatation. Pancreas: No mass, inflammation or ductal dilatation. Spleen: Normal size.  No focal lesions. Adrenals/Urinary Tract: The adrenal glands and kidneys are unremarkable and stable. Small renal cysts are noted. No worrisome renal lesions. The bladder is decompressed by a Foley catheter. Stomach/Bowel: Stomach, duodenum, small bowel and colon are grossly normal. Vascular/Lymphatic: Stable advanced atherosclerotic calcifications involving the aorta and iliac arteries but no aneurysm. Stable small scattered mesenteric and retroperitoneal lymph nodes but no mass or adenopathy. No findings suspicious for peritoneal carcinomatosis. Reproductive: Surgically absent. Other: No free air or free fluid. 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 There is a large lytic lesion involving the right acetabulum but I do not see a  definite pathologic fracture. Lytic lesion involving the upper right iliac bone with destruction of the medial cortex. No obvious pathologic hip fracture. Large enhancing masslike area in the left gluteus medius muscle worrisome for a muscle involvement, likely from direct extension. Other areas of abnormal muscle enhancement could suggest muscle lesions. IMPRESSION: 1. 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 2. Several areas of masslike enhancement in the muscles of the spine and pelvis worrisome for tumor involvement. 3. No findings suspicious for pulmonary metastatic disease. 4. Bilateral pleural effusions, left larger than right with overlying atelectasis left greater than right. 5. No findings suspicious for abdominal/pelvic metastatic disease. 6. Stable advanced atherosclerotic calcifications involving the thoracic and abdominal aorta and iliac arteries. Aortic Atherosclerosis (ICD10-I70.0) and Emphysema (ICD10-J43.9). Electronically Signed   By: Marijo Sanes M.D.   On: 02/08/2021 19:23   MR BRAIN W WO CONTRAST  Result Date: 02/09/2021 CLINICAL DATA:  Restaging metastatic cancer, history of breast cancer with widespread osseous metastatic disease EXAM: MRI HEAD WITHOUT AND WITH CONTRAST MRI CERVICAL SPINE WITHOUT AND WITH CONTRAST TECHNIQUE: Multiplanar, multiecho pulse sequences of the brain and surrounding structures, and cervical spine, to include the craniocervical junction and cervicothoracic junction, were obtained without and with intravenous contrast. CONTRAST:  49mL GADAVIST GADOBUTROL 1 MMOL/ML [facility] COMPARISON:  09/13/2009 MRI head, correlation is also made with 02/08/2021 CT head; no prior MRI of the cervical spine, correlation is made with 02/07/2021 MRI thoracic spine FINDINGS: MRI HEAD FINDINGS Brain: No restricted diffusion to suggest acute or subacute infarct. No acute hemorrhage, mass, mass effect, or midline shift. Leptomeningeal enhancement in  the anterior temporal lobes (series 20, images 23 and 22), posterior left temporal lobe (series 20, image 22), along the anterior surface of the midbrain (series 28, image 25) and pons (series 30, image 12), and in the cerebellar folia (series 28, images 17-25). Enhancement of the medulla and brainstem are better seen on the cervical spine images (series 26, image 7). No hydrocephalus or extra-axial collection. Scattered T2 hyperintense signal in the periventricular white matter, likely the sequela of mild chronic small vessel ischemic disease. No cerebral edema. Vascular: Normal flow voids. Skull and upper cervical spine: In the midline/left parietal calvarium, there is a 10 x 5 x 8 mm mildly enhancing lesion (series 28, image 35 and series 30, image 14). Otherwise normal marrow signal. Sinuses/Orbits: No acute finding. Status post bilateral lens replacements. Other: Fluid throughout the right mastoid air cells. MRI CERVICAL SPINE FINDINGS Evaluation is somewhat limited by motion artifact. Alignment: Straightening of the normal cervical lordosis. No listhesis. Vertebrae: Abnormal T1 signal with patchy enhancement, for example in C2 and C6 consistent with known extensive osseous metastatic disease. No evidence of pathologic fracture. For findings in the thoracic spine, see 02/07/2021 MRI thoracic spine. Cord: Leptomeningeal enhancement along the brainstem and majority of the cervical spinal cord (series 26, image 7 and series 27, image 13). The spinal cord demonstrates increased T2 signal centrally from the level of C2 and extending inferiorly throughout the imaged spinal cord, although this is most prominent from the level of C5 extending inferiorly (series 25, image 7 and series 22, image 17). From C6 through C7-T1, there also appears to be prominence of the central canal of the spinal cord (series 22, images 21-27). Posterior Fossa, vertebral arteries, paraspinal tissues: For findings  related to the pons and  cerebellum, please see MRI brain findings above. Disc levels: Degenerative changes without significant spinal canal stenosis. IMPRESSION: MRI BRAIN: 1. 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 2. No abnormal parenchymal enhancement to suggest parenchymal metastatic disease. 3. Focus of enhancement in the left parietal calvarium is favored to be benign. Attention on follow-up. MRI CERVICAL SPINE 1. Leptomeningeal enhancement along the brainstem and the majority of the cervical spinal cord, with increased T2 signal in the cervical spinal cord, most likely edema. In addition, there may be prominence of the central canal from C6 through C7-T1. 2. Known osseous metastatic disease, without evidence of pathologic fracture. Electronically Signed   By: Merilyn Baba M.D.   On: 02/09/2021 14:28   MR CERVICAL SPINE W WO CONTRAST  Result Date: 02/09/2021 CLINICAL DATA:  Restaging metastatic cancer, history of breast cancer with widespread osseous metastatic disease EXAM: MRI HEAD WITHOUT AND WITH CONTRAST MRI CERVICAL SPINE WITHOUT AND WITH CONTRAST TECHNIQUE: Multiplanar, multiecho pulse sequences of the brain and surrounding structures, and cervical spine, to include the craniocervical junction and cervicothoracic junction, were obtained without and with intravenous contrast. CONTRAST:  63mL GADAVIST GADOBUTROL 1 MMOL/ML [facility] COMPARISON:  09/13/2009 MRI head, correlation is also made with 02/08/2021 CT head; no prior MRI of the cervical spine, correlation is made with 02/07/2021 MRI thoracic spine FINDINGS: MRI HEAD FINDINGS Brain: No restricted diffusion to suggest acute or subacute infarct. No acute hemorrhage, mass, mass effect, or midline shift. Leptomeningeal enhancement in the anterior temporal lobes (series 20, images 23 and 22), posterior left temporal lobe (series 20, image 22),  along the anterior surface of the midbrain (series 28, image 25) and pons (series 30, image 12), and in the cerebellar folia (series 28, images 17-25). Enhancement of the medulla and brainstem are better seen on the cervical spine images (series 26, image 7). No hydrocephalus or extra-axial collection. Scattered T2 hyperintense signal in the periventricular white matter, likely the sequela of mild chronic small vessel ischemic disease. No cerebral edema. Vascular: Normal flow voids. Skull and upper cervical spine: In the midline/left parietal calvarium, there is a 10 x 5 x 8 mm mildly enhancing lesion (series 28, image 35 and series 30, image 14). Otherwise normal marrow signal. Sinuses/Orbits: No acute finding. Status post bilateral lens replacements. Other: Fluid throughout the right mastoid air cells. MRI CERVICAL SPINE FINDINGS Evaluation is somewhat limited by motion artifact. Alignment: Straightening of the normal cervical lordosis. No listhesis. Vertebrae: Abnormal T1 signal with patchy enhancement, for example in C2 and C6 consistent with known extensive osseous metastatic disease. No evidence of pathologic fracture. For findings in the thoracic spine, see 02/07/2021 MRI thoracic spine. Cord: Leptomeningeal enhancement along the brainstem and majority of the cervical spinal cord (series 26, image 7 and series 27, image 13). The spinal cord demonstrates increased T2 signal centrally from the level of C2 and extending inferiorly throughout the imaged spinal cord, although this is most prominent from the level of C5 extending inferiorly (series 25, image 7 and series 22, image 17). From C6 through C7-T1, there also appears to be prominence of the central canal of the spinal cord (series 22, images 21-27). Posterior Fossa, vertebral arteries, paraspinal tissues: For findings related to the pons and cerebellum, please see MRI brain findings above. Disc levels: Degenerative changes without significant spinal canal  stenosis. IMPRESSION: MRI BRAIN: 1. 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 2. No abnormal parenchymal enhancement to suggest parenchymal metastatic disease. 3. Focus of enhancement in the left parietal calvarium is favored to be benign. Attention on follow-up. MRI CERVICAL SPINE 1. Leptomeningeal enhancement along the brainstem and the majority of the cervical spinal cord, with increased T2 signal in the cervical spinal cord, most likely edema. In addition, there may be prominence of the central canal from C6 through C7-T1. 2. Known osseous metastatic disease, without evidence of pathologic fracture. Electronically Signed   By: Merilyn Baba M.D.   On: 02/09/2021 14:28   MR THORACIC SPINE W WO CONTRAST  Result Date: 02/07/2021 CLINICAL DATA:  Back pain. Difficulty with urination. History of breast cancer with widespread osseous metastatic disease. EXAM: MRI THORACIC AND LUMBAR SPINE WITHOUT AND WITH CONTRAST TECHNIQUE: Multiplanar and multiecho pulse sequences of the thoracic and lumbar spine were obtained without and with intravenous contrast. CONTRAST:  32mL GADAVIST GADOBUTROL 1 MMOL/ML [facility] COMPARISON:  Lumbar spine radiographs 02/05/2021. Nuclear medicine whole-body bone scan 01/03/2021. Chest CT 01/03/2021. Lumbar spine MRI 10/04/2019. FINDINGS: MRI THORACIC SPINE FINDINGS The study is moderately motion degraded, greatest on axial sequences. Alignment: Mild lower thoracic dextroscoliosis. No significant listhesis. Vertebrae: Known widespread metastatic disease throughout the vertebral bodies and posterior elements at every thoracic level as well as in the included cervical spine. Pathologic T4 compression fracture with mild vertebral body height loss, similar to the prior chest CT. A T9 superior endplate Schmorl's node is unchanged. There is evidence of epidural tumor in  the lower thoracic spine, greatest at T10-11 where there is resultant moderate spinal stenosis. The craniocaudal extent of epidural tumor is difficult to precisely determine although it may extend cranially to approximately T7-8. Cord: Abnormal enhancement along the surface of the spinal cord at least throughout the mid and upper thoracic spine with extension towards the cervical region. Abnormal T2 hyperintensity centrally in the upper and, to a lesser extent, mid thoracic spinal cord which could reflect edema or a syrinx extending towards the cervical region and measuring up to approximately 4 mm in diameter near the cervicothoracic junction. Additional edema in the lower thoracic spinal cord. Paraspinal and other soft tissues: Small left pleural effusion, larger than on the prior chest CT. Widespread STIR hyperintensity and enhancement of the left greater than right posterior paraspinal musculature. Disc levels: Disc degeneration greatest at T10-11 and T11-12 where there is asymmetrically severe left-sided disc space narrowing. Up to moderate lower thoracic spinal stenosis due to epidural tumor. MRI LUMBAR SPINE FINDINGS Segmentation:  Standard. Alignment: Moderate lumbar levoscoliosis. No significant listhesis. Vertebrae: Chronic L4 Schmorl's nodes and mild chronic L3 compression fracture. Known widespread osseous metastatic disease throughout the lumbar spine and included pelvis. Epidural tumor at the thoracolumbar junction as noted above on the thoracic spine MRI. Mildly prominent epidural enhancement throughout the lumbar spine more caudally, indeterminate for epidural tumor but without a dominant epidural mass in the lumbar spine. Epidural tumor is suspected in the sacrum involving left-sided neural foramina. Prominent perineural cysts at multiple levels as previously seen. Conus medullaris: Extends to the T12-L1 level and demonstrates edema as noted above. There is abnormal nodular enhancement along the  surface of the distal spinal cord/conus, and there also appears to be mild enhancement of cauda equina nerve roots in the lower lumbar and sacral canal. Paraspinal and other soft tissues: Chronic asymmetric left psoas and left greater than right  posterior paraspinal muscle atrophy. Partially visualized extensive gluteal musculature enhancement bilaterally. Small right renal cyst. Disc levels: L1-2: Disc bulging and mild facet and ligamentum flavum hypertrophy result in moderate right lateral recess stenosis and moderate right and mild left neural foraminal stenosis, similar to the prior MRI. Borderline to mild spinal stenosis. L2-3: Disc bulging and moderate facet and ligamentum flavum hypertrophy result in mild right lateral recess stenosis and severe right neural foraminal stenosis, similar to the prior MRI. Borderline spinal stenosis. L3-4: Right eccentric disc bulging and mild-to-moderate facet and ligamentum flavum hypertrophy result in borderline to mild right lateral recess stenosis and mild right neural foraminal stenosis without spinal stenosis, similar to the prior MRI. L4-5: Minimal disc bulging without stenosis. L5-S1: Negative. IMPRESSION: 1. Known widespread metastatic disease throughout the thoracic and lumbar spine. 2. 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 3. Abnormal enhancement relatively diffusely along the surface of the thoracic spinal cord with suspected mild involvement of the cauda equina nerve roots compatible with left meningeal disease. 4. Edema in the distal spinal cord/conus as well as edema and/or syrinx in the upper thoracic cord extending into the cervical region. 5. Widespread edema/enhancement involving the thoracic paraspinal musculature and bilateral gluteal musculature, potentially denervation or rhabdomyolysis. 6. Small left pleural effusion. Electronically Signed   By: Logan Bores M.D.   On: 02/07/2021 16:11   MR Lumbar  Spine W Wo Contrast  Result Date: 02/07/2021 CLINICAL DATA:  Back pain. Difficulty with urination. History of breast cancer with widespread osseous metastatic disease. EXAM: MRI THORACIC AND LUMBAR SPINE WITHOUT AND WITH CONTRAST TECHNIQUE: Multiplanar and multiecho pulse sequences of the thoracic and lumbar spine were obtained without and with intravenous contrast. CONTRAST:  59mL GADAVIST GADOBUTROL 1 MMOL/ML [facility] COMPARISON:  Lumbar spine radiographs 02/05/2021. Nuclear medicine whole-body bone scan 01/03/2021. Chest CT 01/03/2021. Lumbar spine MRI 10/04/2019. FINDINGS: MRI THORACIC SPINE FINDINGS The study is moderately motion degraded, greatest on axial sequences. Alignment: Mild lower thoracic dextroscoliosis. No significant listhesis. Vertebrae: Known widespread metastatic disease throughout the vertebral bodies and posterior elements at every thoracic level as well as in the included cervical spine. Pathologic T4 compression fracture with mild vertebral body height loss, similar to the prior chest CT. A T9 superior endplate Schmorl's node is unchanged. There is evidence of epidural tumor in the lower thoracic spine, greatest at T10-11 where there is resultant moderate spinal stenosis. The craniocaudal extent of epidural tumor is difficult to precisely determine although it may extend cranially to approximately T7-8. Cord: Abnormal enhancement along the surface of the spinal cord at least throughout the mid and upper thoracic spine with extension towards the cervical region. Abnormal T2 hyperintensity centrally in the upper and, to a lesser extent, mid thoracic spinal cord which could reflect edema or a syrinx extending towards the cervical region and measuring up to approximately 4 mm in diameter near the cervicothoracic junction. Additional edema in the lower thoracic spinal cord. Paraspinal and other soft tissues: Small left pleural effusion, larger than on the prior chest CT. Widespread STIR  hyperintensity and enhancement of the left greater than right posterior paraspinal musculature. Disc levels: Disc degeneration greatest at T10-11 and T11-12 where there is asymmetrically severe left-sided disc space narrowing. Up to moderate lower thoracic spinal stenosis due to epidural tumor. MRI LUMBAR SPINE FINDINGS Segmentation:  Standard. Alignment: Moderate lumbar levoscoliosis. No significant listhesis. Vertebrae: Chronic L4 Schmorl's nodes and mild chronic L3 compression fracture. Known widespread osseous  metastatic disease throughout the lumbar spine and included pelvis. Epidural tumor at the thoracolumbar junction as noted above on the thoracic spine MRI. Mildly prominent epidural enhancement throughout the lumbar spine more caudally, indeterminate for epidural tumor but without a dominant epidural mass in the lumbar spine. Epidural tumor is suspected in the sacrum involving left-sided neural foramina. Prominent perineural cysts at multiple levels as previously seen. Conus medullaris: Extends to the T12-L1 level and demonstrates edema as noted above. There is abnormal nodular enhancement along the surface of the distal spinal cord/conus, and there also appears to be mild enhancement of cauda equina nerve roots in the lower lumbar and sacral canal. Paraspinal and other soft tissues: Chronic asymmetric left psoas and left greater than right posterior paraspinal muscle atrophy. Partially visualized extensive gluteal musculature enhancement bilaterally. Small right renal cyst. Disc levels: L1-2: Disc bulging and mild facet and ligamentum flavum hypertrophy result in moderate right lateral recess stenosis and moderate right and mild left neural foraminal stenosis, similar to the prior MRI. Borderline to mild spinal stenosis. L2-3: Disc bulging and moderate facet and ligamentum flavum hypertrophy result in mild right lateral recess stenosis and severe right neural foraminal stenosis, similar to the prior MRI.  Borderline spinal stenosis. L3-4: Right eccentric disc bulging and mild-to-moderate facet and ligamentum flavum hypertrophy result in borderline to mild right lateral recess stenosis and mild right neural foraminal stenosis without spinal stenosis, similar to the prior MRI. L4-5: Minimal disc bulging without stenosis. L5-S1: Negative. IMPRESSION: 1. Known widespread metastatic disease throughout the thoracic and lumbar spine. 2. 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 3. Abnormal enhancement relatively diffusely along the surface of the thoracic spinal cord with suspected mild involvement of the cauda equina nerve roots compatible with left meningeal disease. 4. Edema in the distal spinal cord/conus as well as edema and/or syrinx in the upper thoracic cord extending into the cervical region. 5. Widespread edema/enhancement involving the thoracic paraspinal musculature and bilateral gluteal musculature, potentially denervation or rhabdomyolysis. 6. Small left pleural effusion. Electronically Signed   By: Logan Bores M.D.   On: 02/07/2021 16:11   CT Abdomen Pelvis W Contrast  Result Date: 03/04/2021 CLINICAL DATA:  Abdominal pain, metastatic breast carcinoma EXAM: CT ABDOMEN AND PELVIS WITH CONTRAST TECHNIQUE: Multidetector CT imaging of the abdomen and pelvis was performed using the standard protocol following bolus administration of intravenous contrast. RADIATION DOSE REDUCTION: This exam was performed according to the departmental dose-optimization program which includes automated exposure control, adjustment of the mA and/or kV according to patient size and/or use of iterative reconstruction technique. CONTRAST:  116mL OMNIPAQUE IOHEXOL 300 MG/ML  [facility] COMPARISON:  03/02/2021 FINDINGS: Lower chest: Small to moderate bilateral pleural effusions are seen, more so on the left side. There are patchy infiltrates in the posterior aspects of both lower lung  fields with interval worsening. Hepatobiliary: No focal abnormality is seen in the liver. There is subtle increased density in lumen of gallbladder, possibly due to vicarious excretion of iodinated contrast from previous CT. There is no wall thickening in gallbladder. Pancreas: No focal abnormality is seen. There is prominence of pancreatic duct in the head and body. Spleen: Spleen appears smaller than usual in size. Adrenals/Urinary Tract: Adrenals are unremarkable. There is no hydronephrosis. There are no renal or ureteral stones. There is 1.6 cm smooth marginated fluid density lesion in the midportion of left kidney, possibly a cyst urinary bladder is not distended. Foley catheter is seen in  the bladder. Stomach/Bowel: Stomach is unremarkable. Small bowel loops are not dilated. Appendix is not distinctly seen. There is no focal pericecal inflammation. There is no significant wall thickening in the colon. There is no pericolic stranding or fluid collection. Vascular/Lymphatic: Arterial calcifications are seen in the aorta and its major branches. Reproductive: Uterus is not seen. There are no dominant adnexal masses. Other: There is possible trace amount of free fluid in the right side of pelvis. There is no pneumoperitoneum. Small right inguinal hernia containing fat is seen. There is subcutaneous edema in the abdominal wall. Musculoskeletal: There is patchy enhancement in the paraspinal muscles, right iliacus muscle and gluteal muscles on both sides, more so on the left side. Left psoas muscle appears smaller than usual. These findings have not changed significantly. There are numerous lytic lesions of varying sizes in the skeletal structures consistent with skeletal metastatic disease. IMPRESSION: There is no evidence of intestinal obstruction or pneumoperitoneum. There is no hydronephrosis. Small to moderate bilateral pleural effusions, more so on the left side. There are patchy infiltrates in both lower lung  fields with possible slight worsening suggesting atelectasis/pneumonia. Extensive skeletal metastatic disease. There is abnormal patchy enhancement in multiple muscles suggesting possible active inflammatory or neoplastic process. Other findings as described in the body of the report. Extensive skeletal metastatic disease. There is patchy enhancement in the multiple muscles as described in the body of the report which may suggest inflammatory or neoplastic process in the soft tissues. Electronically Signed   By: Elmer Picker M.D.   On: 03/04/2021 17:10   CT ABDOMEN PELVIS W CONTRAST  Result Date: 03/02/2021 CLINICAL DATA:  Abdomen pain EXAM: CT ABDOMEN AND PELVIS WITH CONTRAST TECHNIQUE: Multidetector CT imaging of the abdomen and pelvis was performed using the standard protocol following bolus administration of intravenous contrast. RADIATION DOSE REDUCTION: This exam was performed according to the departmental dose-optimization program which includes automated exposure control, adjustment of the mA and/or kV according to patient size and/or use of iterative reconstruction technique. CONTRAST:  153mL OMNIPAQUE IOHEXOL 300 MG/ML  SOLN COMPARISON:  CT 08/14/2018, CT 02/08/2021 FINDINGS: Lower chest: Small right and small moderate left pleural effusions. Normal cardiac size. Partial consolidations in the lower lobes likely due to atelectasis. Hepatobiliary: No focal liver abnormality is seen. No gallstones, gallbladder wall thickening, or biliary dilatation. Pancreas: Unremarkable. No pancreatic ductal dilatation or surrounding inflammatory changes. Spleen: Normal in size without focal abnormality. Adrenals/Urinary Tract: Adrenal glands are within normal limits. No hydronephrosis. Cyst midpole left kidney. Foley catheter in the bladder. Stomach/Bowel: The stomach is nonenlarged. No dilated small bowel. No acute bowel wall thickening. Large stool burden. Vascular/Lymphatic: Advanced aortic atherosclerosis.  No aneurysm. No increasing adenopathy. Reproductive: Status post hysterectomy. No adnexal masses. Other: Negative for pelvic effusion or free air. Musculoskeletal: Heterogenous masslike areas of muscle enhancement involving the gluteal regions, right anterior hip muscles, right iliacus muscle, and the paraspinal musculature. Extensive lytic and sclerotic lesions consistent with widespread skeletal metastatic disease. IMPRESSION: 1. Negative for bowel obstruction or bowel wall thickening. Large stool burden suggesting constipation. 2. Small moderate left and small right-sided pleural effusion with probable passive atelectasis at the left greater than right lower lobes 3. 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 Extensive heterogeneous masslike enhancement involving the gluteal muscles, paraspinal muscles, and right hip muscles concerning for soft tissue metastatic disease. Electronically Signed   By: Donavan Foil M.D.   On: 03/02/2021 22:29   CT ABDOMEN PELVIS W CONTRAST  Result Date: 02/08/2021 CLINICAL  DATA:  Restaging metastatic breast cancer. EXAM: CT CHEST, ABDOMEN, AND PELVIS WITH CONTRAST TECHNIQUE: Multidetector CT imaging of the chest, abdomen and pelvis was performed following the standard protocol during bolus administration of intravenous contrast. RADIATION DOSE REDUCTION: This exam was performed according to the departmental dose-optimization program which includes automated exposure control, adjustment of the mA and/or kV according to patient size and/or use of iterative reconstruction technique. CONTRAST:  72mL OMNIPAQUE IOHEXOL 300 MG/ML  SOLN COMPARISON:  Chest CT 01/03/2021. Abdominal/pelvic CT scan 08/14/2018 FINDINGS: CT CHEST FINDINGS Cardiovascular: The heart is normal in size. No pericardial effusion. The aorta is normal in caliber. No dissection. Stable aortic and coronary artery calcifications. Mediastinum/Nodes: No mediastinal or hilar mass or lymphadenopathy. The esophagus is  grossly normal. Lungs/Pleura: Stable emphysematous changes and areas of pulmonary scarring. Small scattered perifissural nodules consistent with small lymph nodes. No worrisome pulmonary lesions to suggest pulmonary metastatic disease. There are bilateral pleural effusions, left larger than right with overlying atelectasis left greater than right. No obvious enhancing pleural nodules. Musculoskeletal: Overall stable appearing severe diffuse metastatic bone disease with mixed lytic and sclerotic changes and old pathologic fractures. I do not see any obvious new or progressive findings. The left paraspinal muscles are thickened and demonstrate heterogeneous enhancement findings suspicious for tumor involvement. CT ABDOMEN PELVIS FINDINGS Hepatobiliary: No hepatic lesions to suggest metastatic disease. The gallbladder is unremarkable. No intra or extrahepatic biliary dilatation. Pancreas: No mass, inflammation or ductal dilatation. Spleen: Normal size.  No focal lesions. Adrenals/Urinary Tract: The adrenal glands and kidneys are unremarkable and stable. Small renal cysts are noted. No worrisome renal lesions. The bladder is decompressed by a Foley catheter. Stomach/Bowel: Stomach, duodenum, small bowel and colon are grossly normal. Vascular/Lymphatic: Stable advanced atherosclerotic calcifications involving the aorta and iliac arteries but no aneurysm. Stable small scattered mesenteric and retroperitoneal lymph nodes but no mass or adenopathy. No findings suspicious for peritoneal carcinomatosis. Reproductive: Surgically absent. Other: No free air or free fluid. 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 There is a large lytic lesion involving the right acetabulum but I do not see a definite pathologic fracture. Lytic lesion involving the upper right iliac bone with destruction of the medial cortex.  No obvious pathologic hip fracture. Large enhancing masslike area in the left gluteus medius muscle worrisome for a muscle involvement, likely from direct extension. Other areas of abnormal muscle enhancement could suggest muscle lesions. IMPRESSION: 1. 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 2. Several areas of masslike enhancement in the muscles of the spine and pelvis worrisome for tumor involvement. 3. No findings suspicious for pulmonary metastatic disease. 4. Bilateral pleural effusions, left larger than right with overlying atelectasis left greater than right. 5. No findings suspicious for abdominal/pelvic metastatic disease. 6. Stable advanced atherosclerotic calcifications involving the thoracic and abdominal aorta and iliac arteries. Aortic Atherosclerosis (ICD10-I70.0) and Emphysema (ICD10-J43.9). Electronically Signed   By: Marijo Sanes M.D.   On: 02/08/2021 19:23   DG Chest Portable 1 View  Result Date: 02/07/2021 CLINICAL DATA:  Shortness of breath, difficulty urinating EXAM: PORTABLE CHEST 1 VIEW COMPARISON:  10/11/2020 FINDINGS: Mild cardiomegaly. Mild, diffuse bilateral interstitial pulmonary opacity. Small right pleural effusion. Unchanged elevation of the left hemidiaphragm with associated atelectasis or consolidation. The visualized skeletal structures are unremarkable. IMPRESSION: 1. Mild, diffuse bilateral interstitial pulmonary opacity, most consistent with edema. No focal airspace  opacity. 2. Small right pleural effusion. 3. Unchanged elevation of the left hemidiaphragm with associated atelectasis or consolidation. Electronically Signed   By: Delanna Ahmadi M.D.   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 EXAM: DIAGNOSTIC LUMBAR PUNCTURE UNDER FLUOROSCOPIC GUIDANCE COMPARISON:  Spine MRI 02/07/2021. Today's head CT, dictated  separately. FLUOROSCOPY TIME:  Fluoroscopy Time:  2 minutes and 48 seconds Radiation Exposure Index (if provided by the fluoroscopic device): 14.9 mGy Number of Acquired Spot Images: 0 PROCEDURE: Informed consent was obtained from the patient prior to the procedure, including potential complications of headache, allergy, and pain. With the patient prone, the lower back was prepped with Betadine. 1% Lidocaine was used for local anesthesia. Lumbar puncture was performed at the L4-5 level using a 20 gauge needle with return of clear CSF with an opening pressure of 9 cm water. After obtaining 2 vials each with 2 cc of clear CSF, the return of CSF became sanguinous. 1 cc of sanguinous CSF was obtained. At this point, the tubing was removed and the stylette placed in order to attempt to clear the CSF flow. Despite multiple maneuvers, CSF return could not be obtained a second time. The patient tolerated the procedure well and there were no apparent complications. IMPRESSION: Successful lumbar puncture, as detailed above. Only a total of 5 cc of CSF were obtained, secondary to the CSF flow becoming sanguinous. Electronically Signed   By: Abigail Miyamoto M.D.   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 Patient was not in favor of palliative care and hospice at this time.  She was hoping to try more treatment and hence medical oncology was consulted to discuss with the patient.  We had a very lengthy discussion  today.  I have clearly expressed that the prognosis is poor with diffuse disease especially leptomeningeal disease, we have discussed that without treatment life expectancy is in the order of weeks, with treatment it may extend to a few months. Patient is very reluctant to proceed with hospice at this time, she would like to try some more treatment hence we have discussed about trying Xeloda for next line of treatment. I have also offered her second opinion at [facility] or [facility] for any other options.  She is not keen to proceed with second opinion at this time but would likely consider it at a later date. We have discussed the side effects from Xeloda including but not limited to fatigue, nausea, vomiting, diarrhea, increased risk of infections, hand-foot syndrome, severe toxicity in patients with DPD deficiency, coronary vasospasm etc.  She understands that chemotherapy can cause fatal side effects at times.  She is willing to give this a try.  She understands that if her tumor progresses on this, we may have to revisit goals of care depending on her performance status at that time.   She and her family are in agreement with this plan.    We will make her a follow-up outpatient.  Chemo teach will be  arranged. I will plan to follow-up with her in 2 to 3 weeks.  I spent 35 minutes in the care of this patient at least including review of records, documentation coordination with other providers, family.  Thank you for this referral.

## 2021-03-10 NOTE — TOC Transition Note (Addendum)
Transition of Care [facility]) - CM/SW Discharge Note   Patient Details  Name: Leslie [name] MRN: [id number] Date of Birth: [birth year]  Transition of Care [facility]) CM/SW Contact:  Ross Ludwig, LCSW Phone Number: 03/10/2021, 4:31 PM   Clinical Narrative:     Patient will be going home with home health through [place].  CSW signing off please reconsult with any other social work needs, home health agency has been notified of planned discharge.  Patient's equipment has been delivered and will be discharging today.    Final next level of care: [place] Barriers to Discharge: Barriers Resolved   Patient Goals and CMS Choice Patient states their goals for this hospitalization and ongoing recovery are:: To return back home with home health. CMS Medicare.gov Compare Post Acute Care list provided to:: Patient Represenative (must comment) Choice offered to / list presented to : Adult Children  Discharge Placement  Home with home health services.                     Discharge Plan and Services                DME Arranged: Tub bench, Hospital bed, Bedside commode DME Agency: [employer] Date DME Agency Contacted: 03/10/21 Time DME Agency Contacted: [phone] Representative spoke with at DME Agency: [place]: RN, PT [place] Agency: [place] ([place]) Date [place]: 03/09/21 Time [place]: [phone] Representative spoke with at [place]: [place] ([place]) Interventions     Readmission Risk Interventions No flowsheet data found.

## 2021-03-10 NOTE — Progress Notes (Signed)
Pt being discharged to home with son. Discharge instructions and medication education provided to pt.  

## 2021-03-10 NOTE — TOC Progression Note (Addendum)
Transition of Care [facility]) - Progression Note    Patient Details  Name: Leslie [name] MRN: [id number] Date of Birth: Feb 11, 1945  Transition of Care [facility]) CM/SW Contact  Ross Ludwig, [place] Phone Number: 03/10/2021, 11:17 AM  Clinical Narrative:     CSW contacted [employer] to find out if the equipment was delivered last night.  Per [employer], it was not delivered, it will be prioritized and delivered today.  [employer] will contact this CSW once equipment is scheduled for delivery.  2:05pm  CSW was informed that [employer] will be delivering patient's equipment next.  DME tech to contact patient's family for delivery.  Once equipment has been delivered patient can discharge as long as she is medically ready for discharge.    Barriers to Discharge: Other (must enter comment) (home delivery of dme)  Expected Discharge Plan and Services           Expected Discharge Date: 03/09/21                                     Social Determinants of Health (SDOH) Interventions    Readmission Risk Interventions No flowsheet data found.

## 2021-03-12 ENCOUNTER — Other Ambulatory Visit (HOSPITAL_COMMUNITY): Payer: Self-pay

## 2021-03-12 ENCOUNTER — Ambulatory Visit: Payer: Medicare Other

## 2021-03-12 DIAGNOSIS — L89152 Pressure ulcer of sacral region, stage 2: Secondary | ICD-10-CM | POA: Diagnosis not present

## 2021-03-12 DIAGNOSIS — C7981 Secondary malignant neoplasm of breast: Secondary | ICD-10-CM | POA: Diagnosis not present

## 2021-03-12 DIAGNOSIS — G834 Cauda equina syndrome: Secondary | ICD-10-CM | POA: Diagnosis not present

## 2021-03-12 DIAGNOSIS — M4804 Spinal stenosis, thoracic region: Secondary | ICD-10-CM | POA: Diagnosis not present

## 2021-03-12 DIAGNOSIS — C50412 Malignant neoplasm of upper-outer quadrant of left female breast: Secondary | ICD-10-CM | POA: Diagnosis not present

## 2021-03-12 DIAGNOSIS — M5136 Other intervertebral disc degeneration, lumbar region: Secondary | ICD-10-CM | POA: Diagnosis not present

## 2021-03-12 NOTE — Telephone Encounter (Signed)
Oral Chemotherapy Pharmacist Encounter  I spoke with patient for overview of: Xeloda (capecitabine) for the  treatment of metastatic, ER positive, PR negative, HER2 negative breast cancer, planned duration until disease progression or unacceptable drug toxicity.  Counseled patient on administration, dosing, side effects, monitoring, drug-food interactions, safe handling, storage, and disposal.  Patient will take Xeloda $RemoveBef'500mg'MnMkPLxmuh$  tablets, 2 tablets ($RemoveBe'1000mg'qYAtNSFtN$ ) by mouth in AM and 2 tabs ($Remov'1000mg'qlfKcE$ ) by mouth in PM, within 30 minutes of finishing meals, for 14 days on, 7 days off, repeated every 21 days.  Xeloda start date: 03/13/2021  Adverse effects include but are not limited to: fatigue, decreased blood counts, GI upset, diarrhea, mouth sores, and hand-foot syndrome.  Patient has anti-emetic on hand and knows to take it if nausea develops.   Patient will obtain anti diarrheal and alert the office of 4 or more loose stools above baseline.  Reviewed with patient importance of keeping a medication schedule and plan for any missed doses. No barriers to medication adherence identified.  Medication reconciliation performed and medication/allergy list updated.  Patient will pick up the medication from [place] on 03/12/2021.  Patient informed the pharmacy will reach out 5-7 days prior to needing next fill of Xeloda to coordinate continued medication acquisition to prevent break in therapy.  All questions answered.  Mrs. Zendejas voiced understanding and appreciation.   Medication education handout placed in mail for patient. Patient knows to call the office with questions or concerns. Oral [facility] Clinic phone number provided to patient.   Drema Halon, PharmD Hematology/Oncology Clinical Pharmacist [facility] [phone]

## 2021-03-13 ENCOUNTER — Ambulatory Visit: Payer: Medicare Other

## 2021-03-14 ENCOUNTER — Ambulatory Visit: Payer: Medicare Other

## 2021-03-15 ENCOUNTER — Ambulatory Visit: Payer: Medicare Other

## 2021-03-16 ENCOUNTER — Ambulatory Visit: Payer: Medicare Other

## 2021-03-16 DIAGNOSIS — C7981 Secondary malignant neoplasm of breast: Secondary | ICD-10-CM | POA: Diagnosis not present

## 2021-03-16 DIAGNOSIS — M4804 Spinal stenosis, thoracic region: Secondary | ICD-10-CM | POA: Diagnosis not present

## 2021-03-16 DIAGNOSIS — G834 Cauda equina syndrome: Secondary | ICD-10-CM | POA: Diagnosis not present

## 2021-03-16 DIAGNOSIS — C50412 Malignant neoplasm of upper-outer quadrant of left female breast: Secondary | ICD-10-CM | POA: Diagnosis not present

## 2021-03-16 DIAGNOSIS — L89152 Pressure ulcer of sacral region, stage 2: Secondary | ICD-10-CM | POA: Diagnosis not present

## 2021-03-16 DIAGNOSIS — M5136 Other intervertebral disc degeneration, lumbar region: Secondary | ICD-10-CM | POA: Diagnosis not present

## 2021-03-19 ENCOUNTER — Ambulatory Visit: Payer: Medicare Other

## 2021-03-19 DIAGNOSIS — C7951 Secondary malignant neoplasm of bone: Secondary | ICD-10-CM | POA: Diagnosis not present

## 2021-03-19 DIAGNOSIS — G834 Cauda equina syndrome: Secondary | ICD-10-CM | POA: Diagnosis not present

## 2021-03-19 DIAGNOSIS — C50812 Malignant neoplasm of overlapping sites of left female breast: Secondary | ICD-10-CM | POA: Diagnosis not present

## 2021-03-19 DIAGNOSIS — Z515 Encounter for palliative care: Secondary | ICD-10-CM | POA: Diagnosis not present

## 2021-03-20 ENCOUNTER — Ambulatory Visit: Payer: Medicare Other

## 2021-03-20 DIAGNOSIS — C7981 Secondary malignant neoplasm of breast: Secondary | ICD-10-CM | POA: Diagnosis not present

## 2021-03-20 DIAGNOSIS — L89152 Pressure ulcer of sacral region, stage 2: Secondary | ICD-10-CM | POA: Diagnosis not present

## 2021-03-20 DIAGNOSIS — C50412 Malignant neoplasm of upper-outer quadrant of left female breast: Secondary | ICD-10-CM | POA: Diagnosis not present

## 2021-03-20 DIAGNOSIS — M5136 Other intervertebral disc degeneration, lumbar region: Secondary | ICD-10-CM | POA: Diagnosis not present

## 2021-03-20 DIAGNOSIS — G834 Cauda equina syndrome: Secondary | ICD-10-CM | POA: Diagnosis not present

## 2021-03-20 DIAGNOSIS — M4804 Spinal stenosis, thoracic region: Secondary | ICD-10-CM | POA: Diagnosis not present

## 2021-03-21 ENCOUNTER — Ambulatory Visit: Payer: Medicare Other

## 2021-03-21 DIAGNOSIS — L89152 Pressure ulcer of sacral region, stage 2: Secondary | ICD-10-CM | POA: Diagnosis not present

## 2021-03-21 DIAGNOSIS — G834 Cauda equina syndrome: Secondary | ICD-10-CM | POA: Diagnosis not present

## 2021-03-21 DIAGNOSIS — M4804 Spinal stenosis, thoracic region: Secondary | ICD-10-CM | POA: Diagnosis not present

## 2021-03-21 DIAGNOSIS — C50412 Malignant neoplasm of upper-outer quadrant of left female breast: Secondary | ICD-10-CM | POA: Diagnosis not present

## 2021-03-21 DIAGNOSIS — M5136 Other intervertebral disc degeneration, lumbar region: Secondary | ICD-10-CM | POA: Diagnosis not present

## 2021-03-21 DIAGNOSIS — C7981 Secondary malignant neoplasm of breast: Secondary | ICD-10-CM | POA: Diagnosis not present

## 2021-03-22 ENCOUNTER — Encounter: Payer: Self-pay | Admitting: Hematology and Oncology

## 2021-03-22 ENCOUNTER — Ambulatory Visit: Payer: Medicare Other

## 2021-03-22 ENCOUNTER — Inpatient Hospital Stay: Payer: Medicare Other | Attending: Oncology | Admitting: Hematology and Oncology

## 2021-03-22 ENCOUNTER — Inpatient Hospital Stay: Payer: Medicare Other

## 2021-03-22 ENCOUNTER — Other Ambulatory Visit: Payer: Self-pay

## 2021-03-22 VITALS — BP 117/57 | HR 92 | Temp 97.5°F | Resp 16

## 2021-03-22 DIAGNOSIS — Z170 Estrogen receptor positive status [ER+]: Secondary | ICD-10-CM

## 2021-03-22 DIAGNOSIS — G834 Cauda equina syndrome: Secondary | ICD-10-CM | POA: Diagnosis not present

## 2021-03-22 DIAGNOSIS — C7932 Secondary malignant neoplasm of cerebral meninges: Secondary | ICD-10-CM | POA: Diagnosis not present

## 2021-03-22 DIAGNOSIS — K5900 Constipation, unspecified: Secondary | ICD-10-CM | POA: Diagnosis not present

## 2021-03-22 DIAGNOSIS — C7951 Secondary malignant neoplasm of bone: Secondary | ICD-10-CM | POA: Diagnosis not present

## 2021-03-22 DIAGNOSIS — F17210 Nicotine dependence, cigarettes, uncomplicated: Secondary | ICD-10-CM | POA: Diagnosis not present

## 2021-03-22 DIAGNOSIS — Z79811 Long term (current) use of aromatase inhibitors: Secondary | ICD-10-CM | POA: Diagnosis not present

## 2021-03-22 DIAGNOSIS — M4804 Spinal stenosis, thoracic region: Secondary | ICD-10-CM | POA: Diagnosis not present

## 2021-03-22 DIAGNOSIS — Z681 Body mass index (BMI) 19 or less, adult: Secondary | ICD-10-CM | POA: Diagnosis not present

## 2021-03-22 DIAGNOSIS — R339 Retention of urine, unspecified: Secondary | ICD-10-CM | POA: Diagnosis not present

## 2021-03-22 DIAGNOSIS — C7989 Secondary malignant neoplasm of other specified sites: Secondary | ICD-10-CM | POA: Diagnosis not present

## 2021-03-22 DIAGNOSIS — R634 Abnormal weight loss: Secondary | ICD-10-CM | POA: Diagnosis not present

## 2021-03-22 DIAGNOSIS — E43 Unspecified severe protein-calorie malnutrition: Secondary | ICD-10-CM | POA: Diagnosis not present

## 2021-03-22 DIAGNOSIS — C50412 Malignant neoplasm of upper-outer quadrant of left female breast: Secondary | ICD-10-CM

## 2021-03-22 DIAGNOSIS — I10 Essential (primary) hypertension: Secondary | ICD-10-CM | POA: Diagnosis not present

## 2021-03-22 DIAGNOSIS — E871 Hypo-osmolality and hyponatremia: Secondary | ICD-10-CM | POA: Diagnosis not present

## 2021-03-22 DIAGNOSIS — Z7952 Long term (current) use of systemic steroids: Secondary | ICD-10-CM | POA: Diagnosis not present

## 2021-03-22 DIAGNOSIS — R627 Adult failure to thrive: Secondary | ICD-10-CM | POA: Diagnosis not present

## 2021-03-22 DIAGNOSIS — Z466 Encounter for fitting and adjustment of urinary device: Secondary | ICD-10-CM | POA: Diagnosis not present

## 2021-03-22 DIAGNOSIS — G96198 Other disorders of meninges, not elsewhere classified: Secondary | ICD-10-CM | POA: Diagnosis not present

## 2021-03-22 DIAGNOSIS — C7981 Secondary malignant neoplasm of breast: Secondary | ICD-10-CM | POA: Diagnosis not present

## 2021-03-22 DIAGNOSIS — Z79891 Long term (current) use of opiate analgesic: Secondary | ICD-10-CM | POA: Diagnosis not present

## 2021-03-22 DIAGNOSIS — L89152 Pressure ulcer of sacral region, stage 2: Secondary | ICD-10-CM | POA: Diagnosis not present

## 2021-03-22 DIAGNOSIS — F32A Depression, unspecified: Secondary | ICD-10-CM | POA: Diagnosis not present

## 2021-03-22 DIAGNOSIS — I739 Peripheral vascular disease, unspecified: Secondary | ICD-10-CM | POA: Diagnosis not present

## 2021-03-22 DIAGNOSIS — M5136 Other intervertebral disc degeneration, lumbar region: Secondary | ICD-10-CM | POA: Diagnosis not present

## 2021-03-22 DIAGNOSIS — E785 Hyperlipidemia, unspecified: Secondary | ICD-10-CM | POA: Diagnosis not present

## 2021-03-22 LAB — CBC WITH DIFFERENTIAL/PLATELET
Abs Immature Granulocytes: 0.25 10*3/uL — ABNORMAL HIGH (ref 0.00–0.07)
Basophils Absolute: 0 10*3/uL (ref 0.0–0.1)
Basophils Relative: 0 %
Eosinophils Absolute: 0 10*3/uL (ref 0.0–0.5)
Eosinophils Relative: 0 %
HCT: 32.2 % — ABNORMAL LOW (ref 36.0–46.0)
Hemoglobin: 10.7 g/dL — ABNORMAL LOW (ref 12.0–15.0)
Immature Granulocytes: 7 %
Lymphocytes Relative: 5 %
Lymphs Abs: 0.2 10*3/uL — ABNORMAL LOW (ref 0.7–4.0)
MCH: 31.6 pg (ref 26.0–34.0)
MCHC: 33.2 g/dL (ref 30.0–36.0)
MCV: 95 fL (ref 80.0–100.0)
Monocytes Absolute: 0.1 10*3/uL (ref 0.1–1.0)
Monocytes Relative: 2 %
Neutro Abs: 2.9 10*3/uL (ref 1.7–7.7)
Neutrophils Relative %: 86 %
Platelets: 123 10*3/uL — ABNORMAL LOW (ref 150–400)
RBC: 3.39 MIL/uL — ABNORMAL LOW (ref 3.87–5.11)
RDW: 18.5 % — ABNORMAL HIGH (ref 11.5–15.5)
Smear Review: NORMAL
WBC: 3.5 10*3/uL — ABNORMAL LOW (ref 4.0–10.5)
nRBC: 3.2 % — ABNORMAL HIGH (ref 0.0–0.2)

## 2021-03-22 LAB — COMPREHENSIVE METABOLIC PANEL
ALT: 45 U/L — ABNORMAL HIGH (ref 0–44)
AST: 31 U/L (ref 15–41)
Albumin: 3.2 g/dL — ABNORMAL LOW (ref 3.5–5.0)
Alkaline Phosphatase: 83 U/L (ref 38–126)
Anion gap: 8 (ref 5–15)
BUN: 17 mg/dL (ref 8–23)
CO2: 28 mmol/L (ref 22–32)
Calcium: 8.8 mg/dL — ABNORMAL LOW (ref 8.9–10.3)
Chloride: 95 mmol/L — ABNORMAL LOW (ref 98–111)
Creatinine, Ser: 0.54 mg/dL (ref 0.44–1.00)
GFR, Estimated: >60 mL/min (ref >60)
Glucose, Bld: 165 mg/dL — ABNORMAL HIGH (ref 70–99)
Potassium: 3.9 mmol/L (ref 3.5–5.1)
Sodium: 131 mmol/L — ABNORMAL LOW (ref 135–145)
Total Bilirubin: 1 mg/dL (ref 0.3–1.2)
Total Protein: 6.5 g/dL (ref 6.5–8.1)

## 2021-03-22 LAB — CEA (IN HOUSE-CHCC): CEA (CHCC-In House): 7.42 ng/mL — ABNORMAL HIGH (ref 0.00–5.00)

## 2021-03-22 NOTE — Progress Notes (Signed)
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  She is in a wheelchair with her son Leslie [name].  She looks weaker.  She tells me that she has not been able to move much in the past 2 weeks.  She according to Leslie [name] also has gotten progressively weaker.  She wonders if this is because she has not been eating well.  She says she has no appetite, takes a few bites.  She says the chemotherapy has not been hard on her at all.  She has noticed some dry skin but no mouth ulcers, nausea vomiting or diarrhea.  She continues to rely on the Foley catheter for voiding. She also has a bedsore from the hospital, according to son, this has been doing well, he has a home health nurse and a palliative care nurse coming.  REVIEW OF SYSTEMS:  COVID 19 VACCINATION STATUS: Status post Moderna x2 with booster x2   HISTORY OF CURRENT ILLNESS: From Dr. Eston [name] he patient evaluation note dated 12/07/2008:   "This is a pleasant 76 year old woman from [name] referred by Dr. Zenia [name] for evaluation and treatment of breast cancer. This woman has been in relatively good health.  She undergoes annual screening mammography.  Her husband is a patient of Dr. Julien [name] who has lung cancer.  She had a screening mammogram on 11/11/2008, which showed an abnormality.  A follow up diagnostic mammogram and  right breast ultrasound was performed.  This showed a hyperdense spiculated mass 12 oclock left breast measuring 5 x 3 x 1.5 cm.  There is an associated abnormality seen on ultrasound as well.  Biopsy was recommended with BSGI scan performed 11/23/2008 showed an uptake with a 3 cm of intense isotope activity.  An ultrasound-guided biopsy of both the breast mass in the left breast as well as left axilla was performed on 11/23/2008, both of which show invasive mammary carcinoma.  Prognostic panel showed this to be ER positive at 99%, PR positive at 7%, proliferative index 17%, HER-2 was negative with ratio of 1.2.  An E-cadherin test was performed, which was negative suggestive of lobular phenotype.  The patient has subsequently been referred for a neoadjuvant therapy.  Of note is she had an MRI scan on 12/01/2008, which showed a 4.9 x 3.5 x 2.9 cm area of lobular carcinoma 12 oclock position left breast.  Axillary lymph nodes were also seen measuring 1.6 x 1.1 cm.  Multiple right breast cysts were also seen as well."   The patient's subsequent history is as detailed below.   PAST MEDICAL HISTORY: Past Medical History:  Diagnosis Date   Benign breast cyst in female    Right breast   Cancer of left breast ([place]) 2010   Depression    History of blood transfusion    "related to tubal pregnancy"   Hypertension    PAD (peripheral artery disease) ([place] [facility])     PAST SURGICAL HISTORY: Past Surgical History:  Procedure  Laterality Date   ABDOMINAL HYSTERECTOMY  1980s   BREAST CYST EXCISION Right ~ 2005   BREAST LUMPECTOMY Left 11/23/2008   ECTOPIC PREGNANCY SURGERY  1966   LOWER EXTREMITY ANGIOGRAM  06/19/2017   LOWER EXTREMITY ANGIOGRAPHY N/A 06/19/2017   Procedure: LOWER EXTREMITY ANGIOGRAPHY;  Surgeon: Leslie Harp, MD;  Location: [facility];  Service: Cardiovascular;  Laterality: N/A;   PERIPHERAL VASCULAR BALLOON ANGIOPLASTY Left 06/19/2017   SFA    PERIPHERAL VASCULAR BALLOON  ANGIOPLASTY  06/19/2017   Procedure: PERIPHERAL VASCULAR BALLOON ANGIOPLASTY;  Surgeon: Leslie Harp, MD;  Location: [facility];  Service: Cardiovascular;;  SFA left  hysterectomy in her 74s secondary to tubal pregnancy, ovaries were intact   FAMILY HISTORY: Family History  Problem Relation Age of Onset   Hypertension Mother    Seizures Mother    Heart disease Father    Hypertension Sister    No history of breast or ovarian cancer in the family.  Father had a history of bad heart; mother died at age 42.    The patient has five sisters, two brothers, all of whom in good health.   GYNECOLOGIC HISTORY:  No LMP recorded. Patient has had a hysterectomy. Menarche: 75 years old Age at first live birth:    years old Leslie [name] 3 HRT yes  Hysterectomy? yes BSO? no   SOCIAL HISTORY: (updated 10/2019)  Leslie [name] "Leslie [name]" retired from working as a Engineer, mining for [employer]. She is widowed. Her husband of 5 years had a history of lung cancer. She lives by herself with no pets.  She has three grown sons that live in [place], [place], and [place].  The patient has 1 granddaughter, age 34, who is attending college studying to be an Chief Financial Officer.  The patient attends a [name]    ADVANCED DIRECTIVES: not in place; the patient plans to name her son Leslie [name], who lives in [place] (and works in Pharmacist, hospital) as her healthcare power of attorney.  He can be reached at [phone]   HEALTH MAINTENANCE: Social History   Tobacco Use   Smoking status: Every Day    Packs/day: 0.50    Years: 45.00    Pack years: 22.50    Types: Cigarettes   Smokeless tobacco: Never  Vaping Use   Vaping Use: Never used  Substance Use Topics   Alcohol use: Yes    Comment: 06/19/2017 "glass of wine 3-4 times/year"   Drug use: Never     Colonoscopy: none on file  PAP: 2012?  Bone density:    Allergies  Allergen Reactions   Aspirin Nausea And Vomiting    Cetirizine Other (See Comments)    Chest Pain    Rosuvastatin Other (See Comments)    MD suspended this due to bone pain    Current Outpatient Medications  Medication Sig Dispense Refill   anastrozole (ARIMIDEX) 1 MG tablet Take 1 tablet (1 mg total) by mouth daily. 90 tablet 4   capecitabine (XELODA) 500 MG tablet Take 2 tablets (1,000 mg total) by mouth 2 (two) times daily after a meal. Take on days 1-14. Repeat every 21 days. 56 tablet 2   Cholecalciferol (VITAMIN D3) 10 MCG (400 UNIT) CAPS Take 400 Units by mouth daily with breakfast.     dexamethasone (DECADRON) 2 MG tablet Take $RemoveBef'2mg'elMmEsqXer$  twice a day on 2/18 , 2/19, 2/20, then take $RemoveB'2mg'ChYBImdZ$  once a day from 2/21 for a week, then take $RemoveB'2mg'rStNGNPL$   every other day from 2/28, stop on 03/30/2021 17 tablet 0   Evening Primrose Oil 1000 MG CAPS Take 1,000 mg by mouth daily.     feeding supplement (ENSURE ENLIVE / ENSURE PLUS) LIQD Take 237 mLs by mouth 2 (two) times daily between meals. 237 mL 12   fluocinonide cream (LIDEX) 7.84 % Apply 1 application topically 2 (two) times daily as needed (for irritation- affected areas).     folic acid (FOLVITE) 696 MCG tablet Take 400 mcg by mouth daily.      lidocaine (LIDODERM) 5 % Place 1 patch onto the skin See admin instructions. Apply 1 patch to the lower back once a day and remove & discard the patch within 12 hours or as directed by MD 30 patch 0   mirtazapine (REMERON) 7.5 MG tablet Take 7.5 mg by mouth at bedtime.     montelukast (SINGULAIR) 10 MG tablet Take 10 mg by mouth at bedtime.     Multiple Vitamin (MULTIVITAMIN PO) Take 1 tablet by mouth daily with breakfast.     nitrofurantoin, macrocrystal-monohydrate, (MACROBID) 100 MG capsule Take 1 capsule (100 mg total) by mouth at bedtime. 10 capsule 0   ondansetron (ZOFRAN) 8 MG tablet Take 1 tablet (8 mg total) by mouth every 8 (eight) hours as needed for nausea or vomiting. 20 tablet 0   pantoprazole (PROTONIX) 40 MG tablet Take 1 tablet (40 mg total) by mouth 2  (two) times daily. 30 tablet 0   polyethylene glycol (MIRALAX / GLYCOLAX) 17 g packet Take 17 g by mouth 2 (two) times daily. 14 each 0   senna-docusate (SENOKOT-S) 8.6-50 MG tablet Take 1 tablet by mouth 2 (two) times daily.     triamcinolone cream (KENALOG) 0.1 % Apply 1 application topically daily as needed (for itching- affectes sites).     vitamin E 400 UNIT capsule Take 400 Units by mouth daily.     No current facility-administered medications for this visit.    OBJECTIVE: African-American woman in no acute distress  Vitals:   03/22/21 0913  BP: (!) 117/57  Pulse: 92  Resp: 16  Temp: (!) 97.5 F (36.4 C)  SpO2: 94%        There is no height or weight on file to calculate BMI.   Wt Readings from Last 3 Encounters:  03/05/21 101 lb 6.6 oz (46 kg)  03/02/21 97 lb (44 kg)  02/28/21 97 lb 14.4 oz (44.4 kg)     ECOG FS:2 - Symptomatic, <50% confined to bed  Physical Exam Constitutional:      Comments: Very frail in wheelchair  Pulmonary:     Effort: Pulmonary effort is normal.     Breath sounds: Normal breath sounds.  Musculoskeletal:     Cervical back: Normal range of motion and neck supple. No rigidity.  Lymphadenopathy:     Cervical: No cervical adenopathy.  Neurological:     Mental Status: She is alert.     Comments: Left lower extremity weaker than the right lower extremity but is able to move against gravity    I could not examine the bedsore, son Leslie [name] said it is very hard for her to get out of wheelchair for examination on the table.  Moreover he said the nurse has mentioned to him that it has been doing well without any infection.  LAB RESULTS:  CMP     Component Value Date/Time   NA 134 (L) 03/08/2021 0442   NA 145 (H) 06/04/2017 1513  NA 141 02/09/2013 1056   K 3.5 03/08/2021 0442   K 3.4 (L) 02/09/2013 1056   CL 105 03/08/2021 0442   CL 102 06/03/2012 1106   CO2 22 03/08/2021 0442   CO2 27 02/09/2013 1056   GLUCOSE 132 (H) 03/08/2021 0442    GLUCOSE 134 02/09/2013 1056   GLUCOSE 101 (H) 06/03/2012 1106   BUN 10 03/08/2021 0442   BUN 11 06/04/2017 1513   BUN 11.0 02/09/2013 1056   CREATININE 0.55 03/08/2021 0442   CREATININE 0.58 02/28/2021 1322   CREATININE 0.9 02/09/2013 1056   CALCIUM 7.9 (L) 03/08/2021 0442   CALCIUM 9.8 02/09/2013 1056   PROT 5.9 (L) 03/04/2021 1225   PROT 7.2 10/06/2018 1005   PROT 7.4 02/09/2013 1056   ALBUMIN 3.0 (L) 03/04/2021 1225   ALBUMIN 4.4 10/06/2018 1005   ALBUMIN 4.0 02/09/2013 1056   AST 40 03/04/2021 1225   AST 33 02/28/2021 1322   AST 19 02/09/2013 1056   ALT 67 (H) 03/04/2021 1225   ALT 64 (H) 02/28/2021 1322   ALT 20 02/09/2013 1056   ALKPHOS 70 03/04/2021 1225   ALKPHOS 68 02/09/2013 1056   BILITOT 0.6 03/04/2021 1225   BILITOT 0.6 02/28/2021 1322   BILITOT 0.49 02/09/2013 1056   GFRNONAA >60 03/08/2021 0442   GFRNONAA >60 02/28/2021 1322   GFRAA >60 06/20/2017 0249    Lab Results  Component Value Date   TOTALPROTELP 7.5 11/03/2019   ALBUMINELP 4.0 11/03/2019   A1GS 0.2 11/03/2019   A2GS 0.8 11/03/2019   BETS 1.1 11/03/2019   GAMS 1.4 11/03/2019   MSPIKE Not Observed 11/03/2019   SPEI Comment 11/03/2019    Lab Results  Component Value Date   WBC 3.5 (L) 03/22/2021   NEUTROABS PENDING 03/22/2021   HGB 10.7 (L) 03/22/2021   HCT 32.2 (L) 03/22/2021   MCV 95.0 03/22/2021   PLT 123 (L) 03/22/2021    Lab Results  Component Value Date   LABCA2 33 12/05/2011    No components found for: EHMCNO709  No results for input(s): INR in the last 168 hours.  Lab Results  Component Value Date   LABCA2 33 12/05/2011    No results found for: CAN199  No results found for: CAN125  No results found for: GGE366  Lab Results  Component Value Date   CA2729 41.3 (H) 11/03/2019    No components found for: HGQUANT  Lab Results  Component Value Date   CEA1 2.33 11/03/2019   /  CEA ([facility])  Date Value Ref Range Status  11/03/2019 2.33 0.00 - 5.00 ng/mL  Final    Comment:    (NOTE) This test was performed using [employer]'s Chemiluminescent Microparticle Immunoassay. Values obtained from different assay methods cannot be used interchangeably. Please note that 5-10% of patients who smoke may see CEA levels up to 6.9 ng/mL. Performed at [facility], [place] [address] [place], [place] [address]      No results found for: AFPTUMOR  No results found for: [name]  Lab Results  Component Value Date   KPAFRELGTCHN 25.8 (H) 11/03/2019   LAMBDASER 17.7 11/03/2019   KAPLAMBRATIO 1.46 11/03/2019   (kappa/lambda light chains)  No results found for: HGBA, HGBA2QUANT, HGBFQUANT, HGBSQUAN (Hemoglobinopathy evaluation)   Lab Results  Component Value Date   LDH 182 06/03/2012    Lab Results  Component Value Date   IRON 40 02/08/2021   TIBC 280 02/08/2021   IRONPCTSAT 14 02/08/2021   (Iron  and TIBC)  Lab Results  Component Value Date   FERRITIN 1,662 (H) 02/08/2021    Urinalysis    Component Value Date/Time   COLORURINE YELLOW 03/04/2021 1415   APPEARANCEUR HAZY (A) 03/04/2021 1415   LABSPEC 1.030 03/04/2021 1415   PHURINE 6.0 03/04/2021 1415   GLUCOSEU NEGATIVE 03/04/2021 1415   HGBUR NEGATIVE 03/04/2021 1415   BILIRUBINUR NEGATIVE 03/04/2021 1415   KETONESUR 5 (A) 03/04/2021 1415   PROTEINUR NEGATIVE 03/04/2021 1415   UROBILINOGEN 0.2 08/17/2010 1105   NITRITE NEGATIVE 03/04/2021 1415   LEUKOCYTESUR SMALL (A) 03/04/2021 1415    STUDIES: CT Abdomen Pelvis W Contrast  Result Date: 03/04/2021 CLINICAL DATA:  Abdominal pain, metastatic breast carcinoma EXAM: CT ABDOMEN AND PELVIS WITH CONTRAST TECHNIQUE: Multidetector CT imaging of the abdomen and pelvis was performed using the standard protocol following bolus administration of intravenous contrast. RADIATION DOSE REDUCTION: This exam was performed according to the departmental dose-optimization program which includes automated exposure control,  adjustment of the mA and/or kV according to patient size and/or use of iterative reconstruction technique. CONTRAST:  112mL OMNIPAQUE IOHEXOL 300 MG/ML  SOLN COMPARISON:  03/02/2021 FINDINGS: Lower chest: Small to moderate bilateral pleural effusions are seen, more so on the left side. There are patchy infiltrates in the posterior aspects of both lower lung fields with interval worsening. Hepatobiliary: No focal abnormality is seen in the liver. There is subtle increased density in lumen of gallbladder, possibly due to vicarious excretion of iodinated contrast from previous CT. There is no wall thickening in gallbladder. Pancreas: No focal abnormality is seen. There is prominence of pancreatic duct in the head and body. Spleen: Spleen appears smaller than usual in size. Adrenals/Urinary Tract: Adrenals are unremarkable. There is no hydronephrosis. There are no renal or ureteral stones. There is 1.6 cm smooth marginated fluid density lesion in the midportion of left kidney, possibly a cyst urinary bladder is not distended. Foley catheter is seen in the bladder. Stomach/Bowel: Stomach is unremarkable. Small bowel loops are not dilated. Appendix is not distinctly seen. There is no focal pericecal inflammation. There is no significant wall thickening in the colon. There is no pericolic stranding or fluid collection. Vascular/Lymphatic: Arterial calcifications are seen in the aorta and its major branches. Reproductive: Uterus is not seen. There are no dominant adnexal masses. Other: There is possible trace amount of free fluid in the right side of pelvis. There is no pneumoperitoneum. Small right inguinal hernia containing fat is seen. There is subcutaneous edema in the abdominal wall. Musculoskeletal: There is patchy enhancement in the paraspinal muscles, right iliacus muscle and gluteal muscles on both sides, more so on the left side. Left psoas muscle appears smaller than usual. These findings have not changed  significantly. There are numerous lytic lesions of varying sizes in the skeletal structures consistent with skeletal metastatic disease. IMPRESSION: There is no evidence of intestinal obstruction or pneumoperitoneum. There is no hydronephrosis. Small to moderate bilateral pleural effusions, more so on the left side. There are patchy infiltrates in both lower lung fields with possible slight worsening suggesting atelectasis/pneumonia. Extensive skeletal metastatic disease. There is abnormal patchy enhancement in multiple muscles suggesting possible active inflammatory or neoplastic process. Other findings as described in the body of the report. Extensive skeletal metastatic disease. There is patchy enhancement in the multiple muscles as described in the body of the report which may suggest inflammatory or neoplastic process in the soft tissues. Electronically Signed   By: Elmer Picker M.D.   On:  03/04/2021 17:10   CT ABDOMEN PELVIS W CONTRAST  Result Date: 03/02/2021 CLINICAL DATA:  Abdomen pain EXAM: CT ABDOMEN AND PELVIS WITH CONTRAST TECHNIQUE: Multidetector CT imaging of the abdomen and pelvis was performed using the standard protocol following bolus administration of intravenous contrast. RADIATION DOSE REDUCTION: This exam was performed according to the departmental dose-optimization program which includes automated exposure control, adjustment of the mA and/or kV according to patient size and/or use of iterative reconstruction technique. CONTRAST:  162mL OMNIPAQUE IOHEXOL 300 MG/ML  SOLN COMPARISON:  CT 08/14/2018, CT 02/08/2021 FINDINGS: Lower chest: Small right and small moderate left pleural effusions. Normal cardiac size. Partial consolidations in the lower lobes likely due to atelectasis. Hepatobiliary: No focal liver abnormality is seen. No gallstones, gallbladder wall thickening, or biliary dilatation. Pancreas: Unremarkable. No pancreatic ductal dilatation or surrounding inflammatory  changes. Spleen: Normal in size without focal abnormality. Adrenals/Urinary Tract: Adrenal glands are within normal limits. No hydronephrosis. Cyst midpole left kidney. Foley catheter in the bladder. Stomach/Bowel: The stomach is nonenlarged. No dilated small bowel. No acute bowel wall thickening. Large stool burden. Vascular/Lymphatic: Advanced aortic atherosclerosis. No aneurysm. No increasing adenopathy. Reproductive: Status post hysterectomy. No adnexal masses. Other: Negative for pelvic effusion or free air. Musculoskeletal: Heterogenous masslike areas of muscle enhancement involving the gluteal regions, right anterior hip muscles, right iliacus muscle, and the paraspinal musculature. Extensive lytic and sclerotic lesions consistent with widespread skeletal metastatic disease. IMPRESSION: 1. Negative for bowel obstruction or bowel wall thickening. Large stool burden suggesting constipation. 2. Small moderate left and small right-sided pleural effusion with probable passive atelectasis at the left greater than right lower lobes 3. 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 Extensive heterogeneous masslike enhancement involving the gluteal muscles, paraspinal muscles, and right hip muscles concerning for soft tissue metastatic disease. Electronically Signed   By: Donavan Foil M.D.   On: 03/02/2021 22:29     ELIGIBLE FOR AVAILABLE RESEARCH PROTOCOL: no  ASSESSMENT: 76 y.o. [employer] [place] woman  1.  Status post left breast upper outer quadrant biopsy and left axillary needle core biopsy on 11/23/2008 for clinical T3 N1, stage IIA invasive lobular carcinoma, E-cadherin negative, estrogen and progesterone receptor positive, HER-2 not amplified, with an MIB-1 of 17% Ki-67 17%   2.    On neoadjuvant letrozole started in 12/2008, interrupted at the time of her definitive surgery, then resumed post-op   4.  Status post left breast lumpectomy with regional lymph node resection 07/12/2009 for a stage IIA,  pT1c pN1, invasive lobular carcinoma,with angiolymphatic invasion identified  (a) 2 of 8 removed left nodes were involved   (b) repeat prognostic panel again estrogen receptor positive, not progesterone receptor negative, with an MIB-1 of 74% and HER-2 not amplified   (c) additional surgery for margin clearance 08/04/2009 showed only atypical lobular hyperplasia   5. Oncotype score of 20 predicts a 5-year rate of recurrence outside the breast with antiestrogens as the only systemic therapy of 13%.  It also predicts no significant benefit from chemotherapy.    6.  status post radiation therapy 09/26/2009-11/11/18  7.  letrozole continued through December 2015 (5 years)  METASTATIC DISEASE: September 2021 8.  Lumbar MRI 10/04/2019 suggests metastatic disease to bone  (a) CT of the chest and bone scan 11/05/2019 confirm multiple lytic and sclerotic bone lesions, but no evidence of visceral disease  (b) bone marrow biopsy 11/19/2019 confirms metastatic carcinoma, estrogen receptor positive and gross cystic disease fluid protein positive, progesterone receptor negative  (c) CA 27-29  and CEA are not informative (CA 27-29 is 41.3 on 11/03/2019)  9.  zoledronate started 11/10/2019, repeat every 12 weeks  10.  anastrozole started 11/05/2019  (a) palbociclib 125 mg daily, 21/7, started 12/02/2019  11. Restaging studies:  (A) chest CT with contrast 06/04/2020 shows no visceral disease, unchanged bony lesions  (B) bone scan 06/04/2020 shows no change compared to 03/09/2020  (C) bone scan 01/03/2021  (D) chest CT 01/03/2021   12.  She was admitted to the hospital with chief complaint of urinary retention and severe back pain from 02/07/2021 to 02/19/2021, case discussed in neuro-oncology conference and the recommendation was to complete craniospinal irradiation.  She started this on 02/16/2020.  She also had systemic imaging which suggested severe diffuse lytic and sclerotic metastatic bone disease,  several areas of masslike enhancement in the muscles of spine and pelvis worrisome for tumor involvement, bilateral pleural effusions, left larger than right.  No findings suspicious for pulmonary metastatic disease or abdominal pelvic metastatic disease.  She completed palliative doses of craniospinal irradiation   13. She was then readmitted to the hospital with abdominal pain, severe constipation, during the hospitalization, she declined hospice and she wanted to try any systemic chemotherapy that might confer some benefit.  She was hoping to at least try it for some time before she thinks about her goals of care.  We have agreed to try Xeloda given her performance status .   PLAN:  Patient is here for follow-up with her son Leslie [name] to discuss treatment recommendations and to discuss toxicity on Xeloda.  She states she has been tolerating medication well.  No side effects.  She however has been more fatigued, more weak in her lower extremities in the past 2 days.  Son states that he has noticed a decline.  She has not been eating well, takes a few bites of food and they do not know if she has lost some weight. I tried to explain to the patient as well as her son that she is clinically deteriorating, she is unable to stand up even for weight measurement  I tried to explain to the patient's son that as hard as it is to believe, she is declining and she may not be able to tolerate chemotherapy for much longer. We have previously discussed that patients with leptomeningeal disease tend to have median prognosis of several weeks. I tried to explain that if she continues to deteriorate when she comes for her next appointment in 2 weeks, I cannot prescribe any further treatment.  I have recommended hospice which is reasonable at this point given her poor performance status. She is not willing to accept hospice yet.  She definitely will consider it if she continues to get weaker in the next 1 to 2 weeks. With  regards to the bedsore, I could not examine it, son said it is very hard for her to get on the examination table.  He understands that she is at risk of infections from chemotherapy concurrently.  He would keep [place] informed if there are any changes reported on the bedsore. Labs today Total encounter time: 40 minutes.   *Total Encounter Time as defined by the Centers for Medicare and Medicaid Services includes, in addition to the face-to-face time of a patient visit (documented in the note above) non-face-to-face time: obtaining and reviewing outside history, ordering and reviewing medications, tests or procedures, care coordination (communications with other health care professionals or caregivers) and documentation in the medical record.

## 2021-03-23 ENCOUNTER — Ambulatory Visit: Payer: Medicare Other

## 2021-03-23 DIAGNOSIS — R110 Nausea: Secondary | ICD-10-CM | POA: Diagnosis not present

## 2021-03-23 DIAGNOSIS — L89154 Pressure ulcer of sacral region, stage 4: Secondary | ICD-10-CM | POA: Diagnosis not present

## 2021-03-23 DIAGNOSIS — I10 Essential (primary) hypertension: Secondary | ICD-10-CM | POA: Diagnosis not present

## 2021-03-23 DIAGNOSIS — C50919 Malignant neoplasm of unspecified site of unspecified female breast: Secondary | ICD-10-CM | POA: Diagnosis not present

## 2021-03-23 DIAGNOSIS — R630 Anorexia: Secondary | ICD-10-CM | POA: Diagnosis not present

## 2021-03-23 DIAGNOSIS — F32A Depression, unspecified: Secondary | ICD-10-CM | POA: Diagnosis not present

## 2021-03-23 DIAGNOSIS — R52 Pain, unspecified: Secondary | ICD-10-CM | POA: Diagnosis not present

## 2021-03-23 DIAGNOSIS — I739 Peripheral vascular disease, unspecified: Secondary | ICD-10-CM | POA: Diagnosis not present

## 2021-03-23 DIAGNOSIS — R531 Weakness: Secondary | ICD-10-CM | POA: Diagnosis not present

## 2021-03-23 LAB — CANCER ANTIGEN 27.29: CA 27.29: 219.9 U/mL — ABNORMAL HIGH (ref 0.0–38.6)

## 2021-03-25 DIAGNOSIS — R630 Anorexia: Secondary | ICD-10-CM | POA: Diagnosis not present

## 2021-03-25 DIAGNOSIS — I10 Essential (primary) hypertension: Secondary | ICD-10-CM | POA: Diagnosis not present

## 2021-03-25 DIAGNOSIS — L89154 Pressure ulcer of sacral region, stage 4: Secondary | ICD-10-CM | POA: Diagnosis not present

## 2021-03-25 DIAGNOSIS — I739 Peripheral vascular disease, unspecified: Secondary | ICD-10-CM | POA: Diagnosis not present

## 2021-03-25 DIAGNOSIS — C50919 Malignant neoplasm of unspecified site of unspecified female breast: Secondary | ICD-10-CM | POA: Diagnosis not present

## 2021-03-25 DIAGNOSIS — F32A Depression, unspecified: Secondary | ICD-10-CM | POA: Diagnosis not present

## 2021-03-26 ENCOUNTER — Other Ambulatory Visit: Payer: Self-pay

## 2021-03-26 ENCOUNTER — Ambulatory Visit: Payer: Medicare Other

## 2021-03-26 ENCOUNTER — Ambulatory Visit
Admission: RE | Admit: 2021-03-26 | Discharge: 2021-03-26 | Disposition: A | Discharge status: Home / Self Care | Payer: Medicare Other | Source: Ambulatory Visit | Attending: Radiation Oncology | Admitting: Radiation Oncology

## 2021-03-26 ENCOUNTER — Other Ambulatory Visit (HOSPITAL_COMMUNITY): Payer: Self-pay

## 2021-03-26 DIAGNOSIS — L89154 Pressure ulcer of sacral region, stage 4: Secondary | ICD-10-CM | POA: Diagnosis not present

## 2021-03-26 DIAGNOSIS — C50919 Malignant neoplasm of unspecified site of unspecified female breast: Secondary | ICD-10-CM | POA: Diagnosis not present

## 2021-03-26 DIAGNOSIS — C7949 Secondary malignant neoplasm of other parts of nervous system: Secondary | ICD-10-CM | POA: Insufficient documentation

## 2021-03-26 DIAGNOSIS — Z170 Estrogen receptor positive status [ER+]: Secondary | ICD-10-CM

## 2021-03-26 DIAGNOSIS — F32A Depression, unspecified: Secondary | ICD-10-CM | POA: Diagnosis not present

## 2021-03-26 DIAGNOSIS — R630 Anorexia: Secondary | ICD-10-CM | POA: Diagnosis not present

## 2021-03-26 DIAGNOSIS — I10 Essential (primary) hypertension: Secondary | ICD-10-CM | POA: Diagnosis not present

## 2021-03-26 DIAGNOSIS — I739 Peripheral vascular disease, unspecified: Secondary | ICD-10-CM | POA: Diagnosis not present

## 2021-03-26 DIAGNOSIS — C50412 Malignant neoplasm of upper-outer quadrant of left female breast: Secondary | ICD-10-CM | POA: Insufficient documentation

## 2021-03-26 NOTE — Progress Notes (Signed)
?  Radiation Oncology         [phone] ?________________________________ ? ?Name: [name] MRN: [id number]  ?Date of Service: 03/26/2021  DOB: November 28, 1945 ? ?Post Treatment Telephone Note ? 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 ? ?Intent: Palliative ? ?Radiation Treatment Dates: 02/15/2021 through 02/28/2021 ?Site Technique Total Dose (Gy) Dose per Fx (Gy) Completed Fx Beam Energies  ?Brain: Brain IMRT 30/30 3 10/10 6X  ?Cervical Spine: Spine_C-T sp IMRT 30/30 3 10/10 6X  ?Lumbar Spine: Spine_T-L sp IMRT 30/30 3 10/10 6X  ? ? ?Impression/Plan: ?1. 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 I was unable to reach the patient and could not leave a voicemail. In reading Dr. Rob Hickman last note, she is leaning toward hospice but the patient is not quite there in the process. She continues to have weakness according to the note from last week, and continues to need foley catheter. I suspect this will have to remain in place long term. We would be happy to evaluate her situation as needed moving forward. ? ? ? ? ?Carola Rhine, PAC  ? ? ? ? ?

## 2021-03-27 ENCOUNTER — Other Ambulatory Visit (HOSPITAL_COMMUNITY): Payer: Self-pay

## 2021-03-27 ENCOUNTER — Ambulatory Visit: Payer: Medicare Other

## 2021-03-28 ENCOUNTER — Ambulatory Visit: Payer: Medicare Other

## 2021-03-29 ENCOUNTER — Ambulatory Visit: Payer: Medicare Other

## 2021-03-29 DIAGNOSIS — C50812 Malignant neoplasm of overlapping sites of left female breast: Secondary | ICD-10-CM | POA: Diagnosis not present

## 2021-03-29 DIAGNOSIS — C7951 Secondary malignant neoplasm of bone: Secondary | ICD-10-CM | POA: Diagnosis not present

## 2021-03-29 DIAGNOSIS — Z515 Encounter for palliative care: Secondary | ICD-10-CM | POA: Diagnosis not present

## 2021-03-29 DIAGNOSIS — G834 Cauda equina syndrome: Secondary | ICD-10-CM | POA: Diagnosis not present

## 2021-03-30 ENCOUNTER — Ambulatory Visit: Payer: Medicare Other

## 2021-04-02 ENCOUNTER — Telehealth: Payer: Self-pay

## 2021-04-02 DIAGNOSIS — Z681 Body mass index (BMI) 19 or less, adult: Secondary | ICD-10-CM | POA: Diagnosis not present

## 2021-04-02 DIAGNOSIS — F32A Depression, unspecified: Secondary | ICD-10-CM | POA: Diagnosis not present

## 2021-04-02 DIAGNOSIS — I70209 Unspecified atherosclerosis of native arteries of extremities, unspecified extremity: Secondary | ICD-10-CM | POA: Diagnosis not present

## 2021-04-02 DIAGNOSIS — L89322 Pressure ulcer of left buttock, stage 2: Secondary | ICD-10-CM | POA: Diagnosis not present

## 2021-04-02 DIAGNOSIS — F17200 Nicotine dependence, unspecified, uncomplicated: Secondary | ICD-10-CM | POA: Diagnosis not present

## 2021-04-02 DIAGNOSIS — C50912 Malignant neoplasm of unspecified site of left female breast: Secondary | ICD-10-CM | POA: Diagnosis not present

## 2021-04-02 DIAGNOSIS — E785 Hyperlipidemia, unspecified: Secondary | ICD-10-CM | POA: Diagnosis not present

## 2021-04-02 DIAGNOSIS — C7949 Secondary malignant neoplasm of other parts of nervous system: Secondary | ICD-10-CM | POA: Diagnosis not present

## 2021-04-02 DIAGNOSIS — L89312 Pressure ulcer of right buttock, stage 2: Secondary | ICD-10-CM | POA: Diagnosis not present

## 2021-04-02 DIAGNOSIS — Z170 Estrogen receptor positive status [ER+]: Secondary | ICD-10-CM | POA: Diagnosis not present

## 2021-04-02 DIAGNOSIS — C7951 Secondary malignant neoplasm of bone: Secondary | ICD-10-CM | POA: Diagnosis not present

## 2021-04-02 DIAGNOSIS — C50412 Malignant neoplasm of upper-outer quadrant of left female breast: Secondary | ICD-10-CM | POA: Diagnosis not present

## 2021-04-02 DIAGNOSIS — G834 Cauda equina syndrome: Secondary | ICD-10-CM | POA: Diagnosis not present

## 2021-04-02 DIAGNOSIS — J9691 Respiratory failure, unspecified with hypoxia: Secondary | ICD-10-CM | POA: Diagnosis not present

## 2021-04-02 DIAGNOSIS — E46 Unspecified protein-calorie malnutrition: Secondary | ICD-10-CM | POA: Diagnosis not present

## 2021-04-02 DIAGNOSIS — L89152 Pressure ulcer of sacral region, stage 2: Secondary | ICD-10-CM | POA: Diagnosis not present

## 2021-04-02 DIAGNOSIS — I700 Atherosclerosis of aorta: Secondary | ICD-10-CM | POA: Diagnosis not present

## 2021-04-02 DIAGNOSIS — K5900 Constipation, unspecified: Secondary | ICD-10-CM | POA: Diagnosis not present

## 2021-04-02 DIAGNOSIS — G4700 Insomnia, unspecified: Secondary | ICD-10-CM | POA: Diagnosis not present

## 2021-04-02 DIAGNOSIS — C7932 Secondary malignant neoplasm of cerebral meninges: Secondary | ICD-10-CM | POA: Diagnosis not present

## 2021-04-02 DIAGNOSIS — C7981 Secondary malignant neoplasm of breast: Secondary | ICD-10-CM | POA: Diagnosis not present

## 2021-04-02 DIAGNOSIS — I10 Essential (primary) hypertension: Secondary | ICD-10-CM | POA: Diagnosis not present

## 2021-04-02 NOTE — Telephone Encounter (Signed)
Leslie [name] from [facility] called and states pt requests home hospice services, per Dr Rob Hickman note, Cassandra with home health states upon her assessment, pt's O2 sats were 70%, with deep breathing O2 came up to 82%. Steffanie Dunn asks if Dr Chryl Heck will be attending MD for pt. Advised I would call back first thing in the morning to give verbal orders once Dr Chryl Heck is in.  ?

## 2021-04-03 DIAGNOSIS — Z170 Estrogen receptor positive status [ER+]: Secondary | ICD-10-CM | POA: Diagnosis not present

## 2021-04-03 DIAGNOSIS — C7949 Secondary malignant neoplasm of other parts of nervous system: Secondary | ICD-10-CM | POA: Diagnosis not present

## 2021-04-03 DIAGNOSIS — C7932 Secondary malignant neoplasm of cerebral meninges: Secondary | ICD-10-CM | POA: Diagnosis not present

## 2021-04-03 DIAGNOSIS — C7951 Secondary malignant neoplasm of bone: Secondary | ICD-10-CM | POA: Diagnosis not present

## 2021-04-03 DIAGNOSIS — F32A Depression, unspecified: Secondary | ICD-10-CM | POA: Diagnosis not present

## 2021-04-03 DIAGNOSIS — C50912 Malignant neoplasm of unspecified site of left female breast: Secondary | ICD-10-CM | POA: Diagnosis not present

## 2021-04-04 DIAGNOSIS — C7932 Secondary malignant neoplasm of cerebral meninges: Secondary | ICD-10-CM | POA: Diagnosis not present

## 2021-04-04 DIAGNOSIS — C7949 Secondary malignant neoplasm of other parts of nervous system: Secondary | ICD-10-CM | POA: Diagnosis not present

## 2021-04-04 DIAGNOSIS — C50912 Malignant neoplasm of unspecified site of left female breast: Secondary | ICD-10-CM | POA: Diagnosis not present

## 2021-04-04 DIAGNOSIS — Z170 Estrogen receptor positive status [ER+]: Secondary | ICD-10-CM | POA: Diagnosis not present

## 2021-04-04 DIAGNOSIS — F32A Depression, unspecified: Secondary | ICD-10-CM | POA: Diagnosis not present

## 2021-04-04 DIAGNOSIS — C7951 Secondary malignant neoplasm of bone: Secondary | ICD-10-CM | POA: Diagnosis not present

## 2021-04-05 ENCOUNTER — Other Ambulatory Visit (HOSPITAL_COMMUNITY): Payer: Self-pay

## 2021-04-05 ENCOUNTER — Inpatient Hospital Stay: Payer: Medicare Other | Admitting: Hematology and Oncology

## 2021-04-06 DIAGNOSIS — C7949 Secondary malignant neoplasm of other parts of nervous system: Secondary | ICD-10-CM | POA: Diagnosis not present

## 2021-04-06 DIAGNOSIS — C7951 Secondary malignant neoplasm of bone: Secondary | ICD-10-CM | POA: Diagnosis not present

## 2021-04-06 DIAGNOSIS — F32A Depression, unspecified: Secondary | ICD-10-CM | POA: Diagnosis not present

## 2021-04-06 DIAGNOSIS — Z170 Estrogen receptor positive status [ER+]: Secondary | ICD-10-CM | POA: Diagnosis not present

## 2021-04-06 DIAGNOSIS — C7932 Secondary malignant neoplasm of cerebral meninges: Secondary | ICD-10-CM | POA: Diagnosis not present

## 2021-04-06 DIAGNOSIS — C50912 Malignant neoplasm of unspecified site of left female breast: Secondary | ICD-10-CM | POA: Diagnosis not present

## 2021-04-07 DIAGNOSIS — F32A Depression, unspecified: Secondary | ICD-10-CM | POA: Diagnosis not present

## 2021-04-07 DIAGNOSIS — C7951 Secondary malignant neoplasm of bone: Secondary | ICD-10-CM | POA: Diagnosis not present

## 2021-04-07 DIAGNOSIS — C7949 Secondary malignant neoplasm of other parts of nervous system: Secondary | ICD-10-CM | POA: Diagnosis not present

## 2021-04-07 DIAGNOSIS — Z170 Estrogen receptor positive status [ER+]: Secondary | ICD-10-CM | POA: Diagnosis not present

## 2021-04-07 DIAGNOSIS — C50912 Malignant neoplasm of unspecified site of left female breast: Secondary | ICD-10-CM | POA: Diagnosis not present

## 2021-04-07 DIAGNOSIS — C7932 Secondary malignant neoplasm of cerebral meninges: Secondary | ICD-10-CM | POA: Diagnosis not present

## 2021-04-09 ENCOUNTER — Other Ambulatory Visit (HOSPITAL_COMMUNITY): Payer: Self-pay

## 2021-04-12 ENCOUNTER — Other Ambulatory Visit (HOSPITAL_COMMUNITY): Payer: Self-pay

## 2021-04-21 DEATH — deceased

## 2021-05-01 ENCOUNTER — Other Ambulatory Visit: Payer: Medicare Other

## 2021-05-04 ENCOUNTER — Telehealth: Payer: Self-pay | Admitting: *Deleted

## 2021-05-04 NOTE — Telephone Encounter (Signed)
10:03 Collaborative advised this nurse of form reportedly e-mailed and faxed to [facility] approximately mid March 2023 for Leslie [name] son, Leslie [name] who needs form completed as soon as possible.       ? ?1044 Informed of no form received to date to form e-mail or MyChart.  Awaiting paperwork from patient's son. ? ?1052 Collaborative notified no paperwork received.   ? ?Connected with Shomari Matusik [phone]) to inquire.  Provided e-mail address [email or web address].  Address incidentally transposed by Mr. Souders.   ?1100 & 1110 Received claimant statement x 2.  No "Proofs of Death" provider statement received.   ? ?1151 Connected with Gwenlyn Saran to notify of above.   ?Received correct paperwork via e-mail.   ? ?1350 Paperwork completed at this time.  Currently to provider for review and signature. ? ?74 Advised Mr. Gangwer to call if further form needs arise.  Completed form e-mailed to [email or web address] as requested.  No further form activity performed or requested. ?

## 2021-05-07 NOTE — Telephone Encounter (Signed)
[email or web address] ?[place] ?*Caution - External email - see footer for warnings* ? ?This message was sent securely using [employer]? ? ?Hello, ?  ?I have a question regarding the Physician statement which isn?t consistent with what I thought was cause of death. The statement notes my mother passed away from Respiratory distress metastatic breast cancer which initially diagnosed from 2010 and came back Sept 2021.  ?  ?I thought my mother?s death was due to Leptomeningeal disease diagnosed 03/06/2021 because she was giving a six month prognosis?  Please confirm if your notes are correct?  ?  ?Thank you, ?[name] ? ?E-mail sent to Gwenlyn Saran about "Proof of Death" insurance paperwork initial question asked "immediate" cause of death with further questions of contributory and particulars of disease.  Bone and brain metastases are contributory or particulars of underlying condition of breast cancer discovered in 2010.  Breathing problems occur with various cancers especially with significant progression and metastasis leading to respiratory problems or distress inducing end of life.  ?

## 2021-08-15 IMAGING — NM NM BONE WHOLE BODY
2 series · 2 of 2 positions shown · non-contrast
Comparison: December 21, 2008.

CLINICAL DATA: History of breast cancer.  Right leg pain.

EXAM:
NUCLEAR MEDICINE WHOLE BODY BONE SCAN
TECHNIQUE: Whole body anterior and posterior images were obtained approximately
3 hours after intravenous injection of radiopharmaceutical.
RADIOPHARMACEUTICALS:  22.0 mCi 2echnetium-GGm MDP IV

[Series 1: whole body · 2.66mm/px · 1 of 1 slices shown (1 of 2)]
[im 1/1]
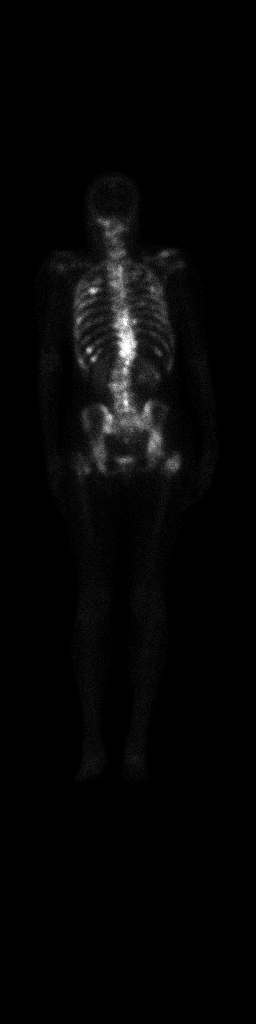

[Series 1: whole body · 2.66mm/px · 1 of 1 slices shown (2 of 2)]
[im 1/1]
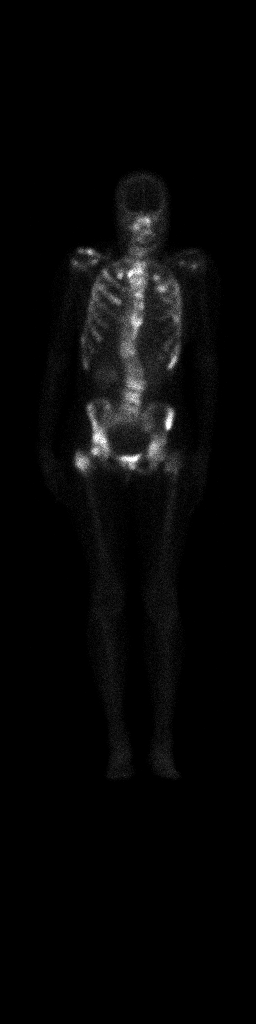

[2 of 2 positions shown; findings below may reference images not displayed]

FINDINGS: Multiple areas of abnormal uptake are seen involving the ribs
bilaterally as well as the thoracic and lumbar spine consistent with
metastatic disease. Abnormal uptake is seen involving the right
acetabulum and proximal right femur as well as the pubic rami
bilaterally on both sides of the pubic symphysis. Abnormal uptake is
noted in the right shoulder which is asymmetric compared to the
other side, concerning for metastatic disease.
IMPRESSION: Multiple foci of osseous metastatic disease is seen involving the
ribs, spine, pelvis and proximal right femur.

## 2022-03-06 ENCOUNTER — Other Ambulatory Visit: Payer: Self-pay

## 2022-12-13 IMAGING — CT CT ABD-PELV W/ CM
2 of 6 series · 15 of 46 positions shown, 17 images · IV contrast (agent unspecified)
Comparison: 03/02/2021

CLINICAL DATA: Abdominal pain, metastatic breast carcinoma

EXAM:
CT ABDOMEN AND PELVIS WITH CONTRAST
TECHNIQUE: Multidetector CT imaging of the abdomen and pelvis was performed
using the standard protocol following bolus administration of
intravenous contrast.

[Series 2: axial st · axial · 0.76mm/px · z∈[+1150,+1480]mm · 12 of 78 slices shown, 14 images]
[im 6/78  soft-tissue]
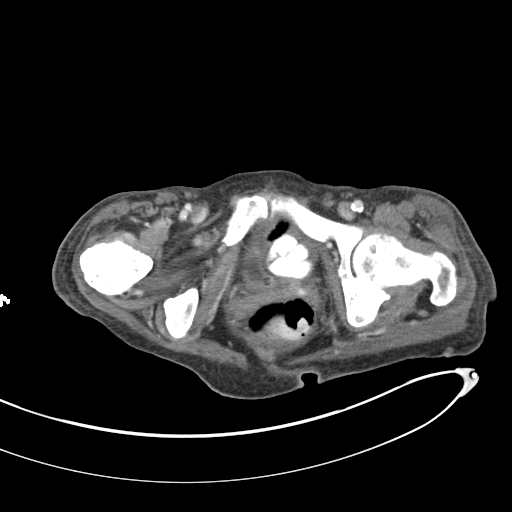
[im 6/78  bone]
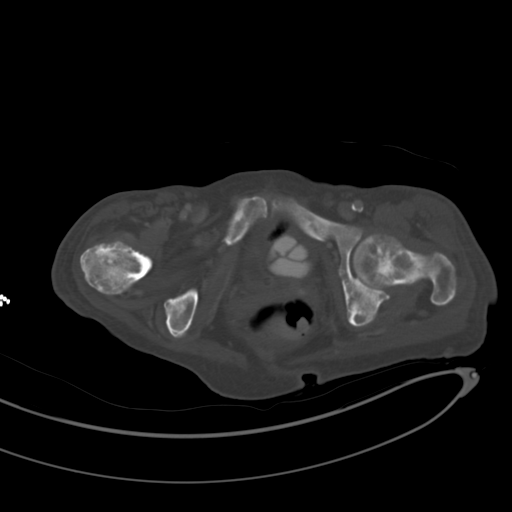
[im 12/78  soft-tissue]
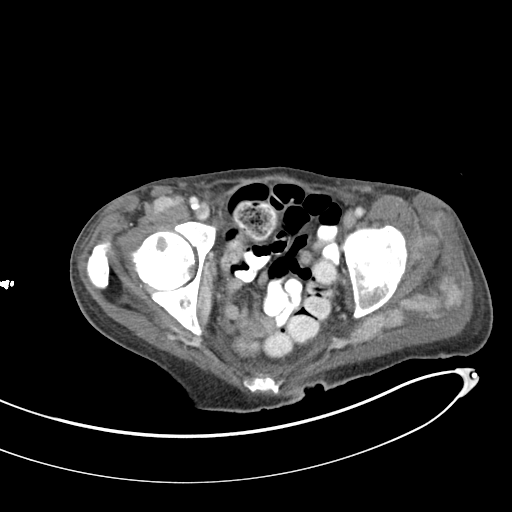
[im 17/78  soft-tissue]
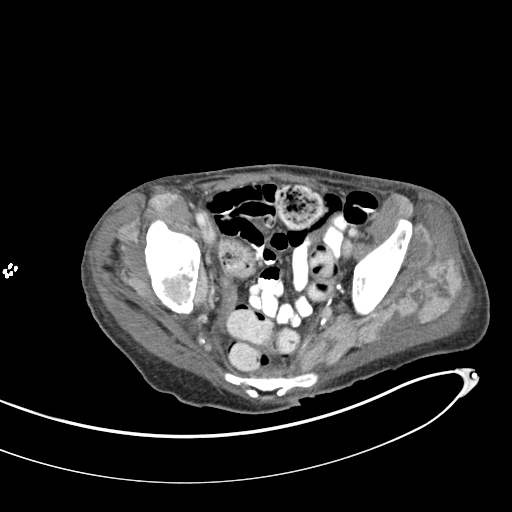
[im 23/78  soft-tissue]
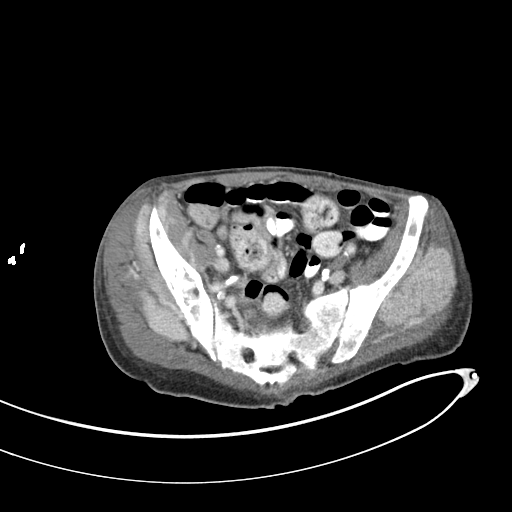
[im 28/78  soft-tissue]
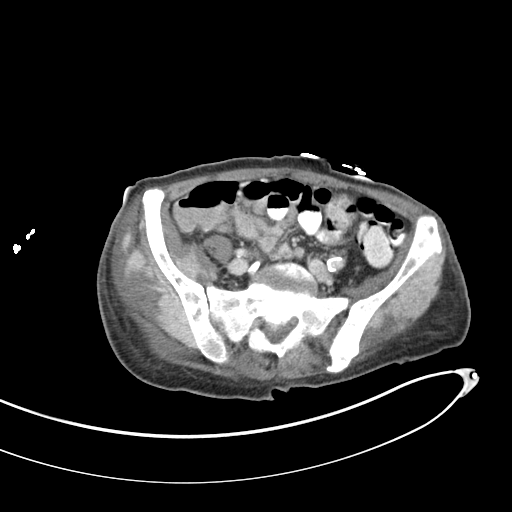
[im 34/78  soft-tissue]
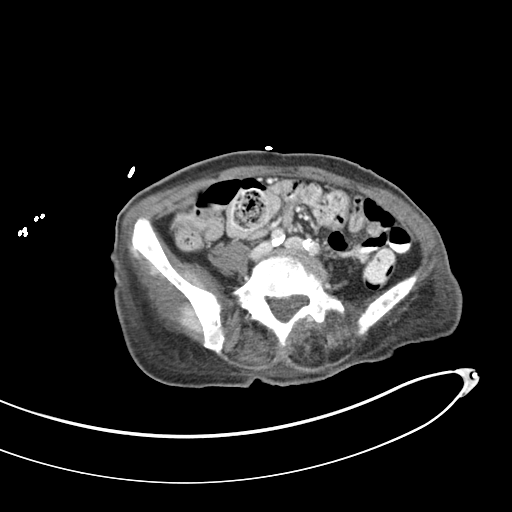
[im 45/78  soft-tissue]
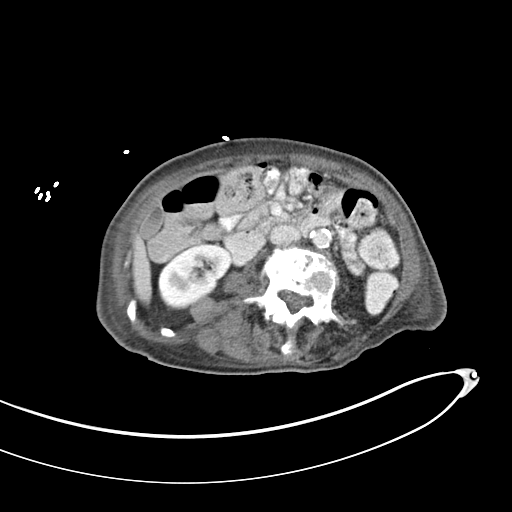
[im 50/78  soft-tissue]
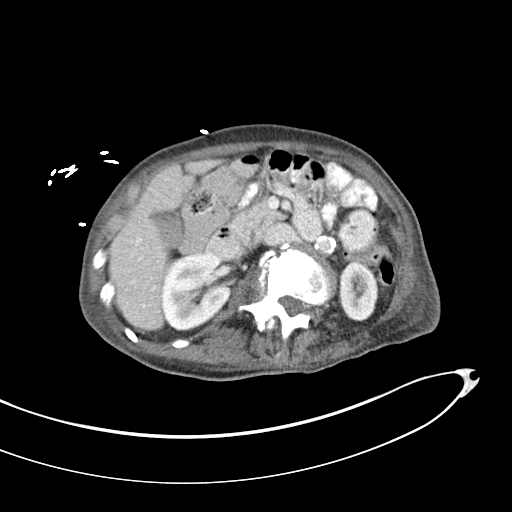
[im 56/78  soft-tissue]
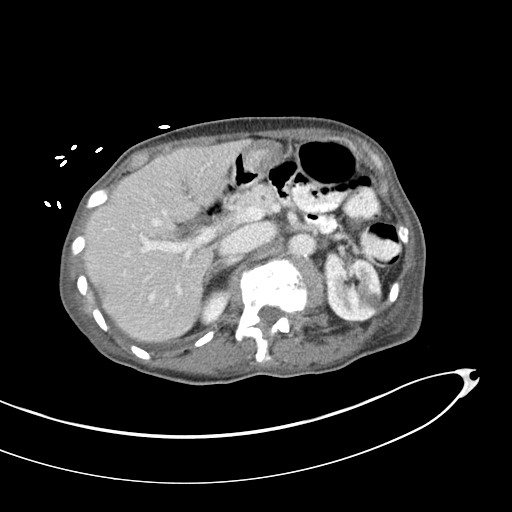
[im 56/78  bone]
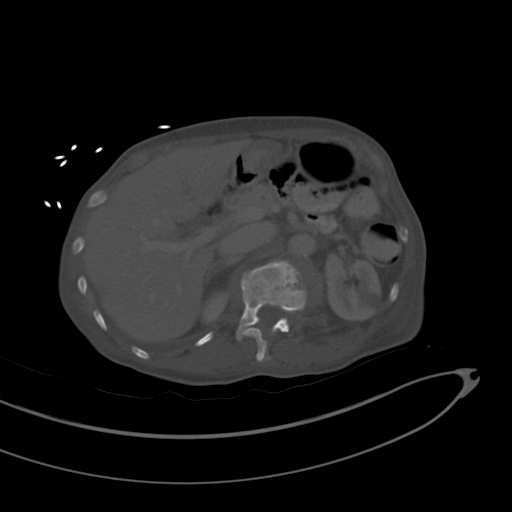
[im 61/78  soft-tissue]
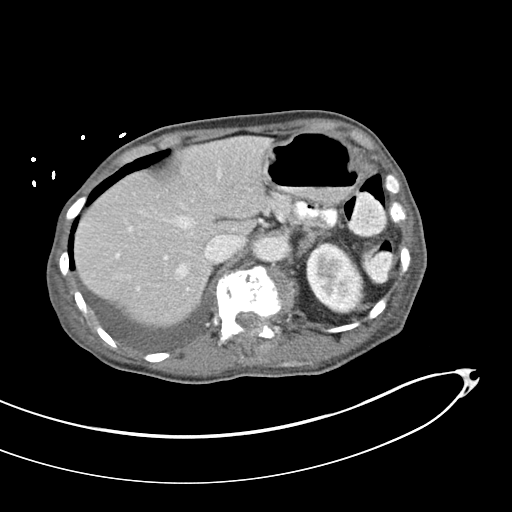
[im 67/78  soft-tissue]
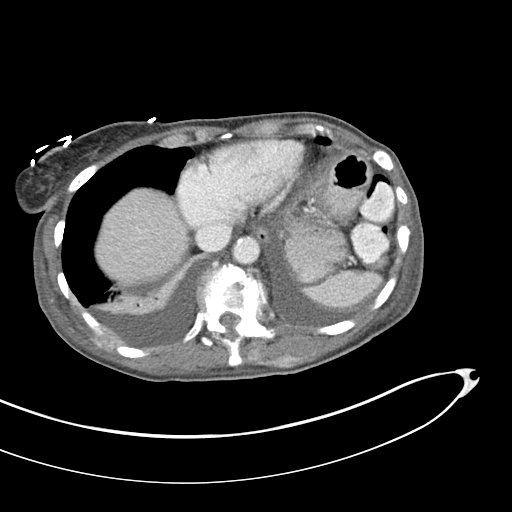
[im 72/78  soft-tissue]
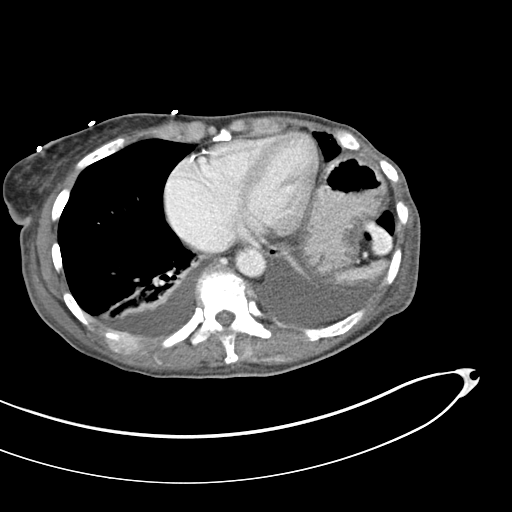

[Series 7: coronal st · coronal · 0.74mm/px · 3 of 116 slices shown]
[im 39/116  soft-tissue]
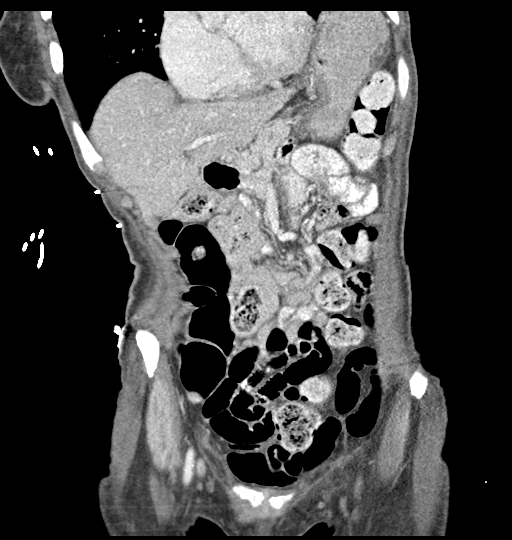
[im 52/116  soft-tissue]
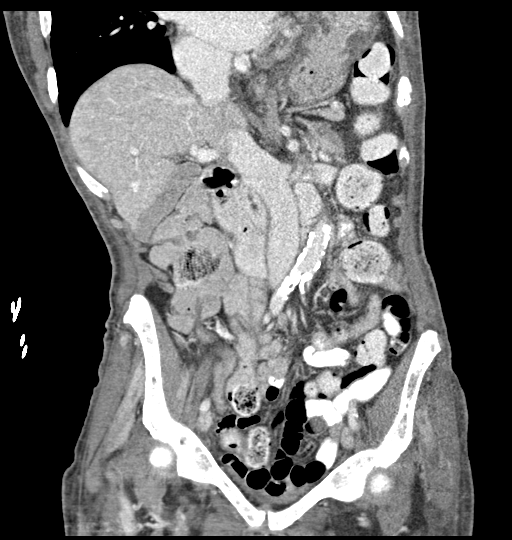
[im 64/116  soft-tissue]
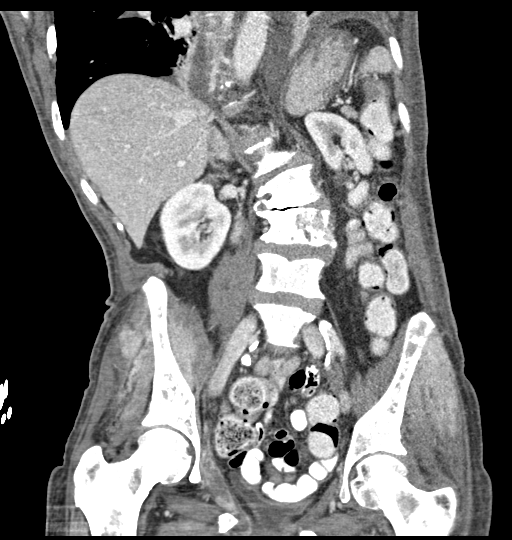

[15 of 46 positions shown; findings below may reference images not displayed]

RADIATION DOSE REDUCTION: This exam was performed according to the
departmental dose-optimization program which includes automated
exposure control, adjustment of the mA and/or kV according to
patient size and/or use of iterative reconstruction technique.

CONTRAST:  100mL OMNIPAQUE IOHEXOL 300 MG/ML  SOLN
FINDINGS: Lower chest: Small to moderate bilateral pleural effusions are seen,
more so on the left side. There are patchy infiltrates in the
posterior aspects of both lower lung fields with interval worsening.

Hepatobiliary: No focal abnormality is seen in the liver. There is
subtle increased density in lumen of gallbladder, possibly due to
vicarious excretion of iodinated contrast from previous CT. There is
no wall thickening in gallbladder.

Pancreas: No focal abnormality is seen. There is prominence of
pancreatic duct in the head and body.

Spleen: Spleen appears smaller than usual in size.

Adrenals/Urinary Tract: Adrenals are unremarkable. There is no
hydronephrosis. There are no renal or ureteral stones. There is
cm smooth marginated fluid density lesion in the midportion of left
kidney, possibly a cyst urinary bladder is not distended. Foley
catheter is seen in the bladder.

Stomach/Bowel: Stomach is unremarkable. Small bowel loops are not
dilated. Appendix is not distinctly seen. There is no focal
pericecal inflammation. There is no significant wall thickening in
the colon. There is no pericolic stranding or fluid collection.

Vascular/Lymphatic: Arterial calcifications are seen in the aorta
and its major branches.

Reproductive: Uterus is not seen. There are no dominant adnexal
masses.

Other: There is possible trace amount of free fluid in the right
side of pelvis. There is no pneumoperitoneum. Small right inguinal
hernia containing fat is seen. There is subcutaneous edema in the
abdominal wall.

Musculoskeletal: There is patchy enhancement in the paraspinal
muscles, right iliacus muscle and gluteal muscles on both sides,
more so on the left side. Left psoas muscle appears smaller than
usual. These findings have not changed significantly. There are
numerous lytic lesions of varying sizes in the skeletal structures
consistent with skeletal metastatic disease.
IMPRESSION: There is no evidence of intestinal obstruction or pneumoperitoneum.
There is no hydronephrosis.

Small to moderate bilateral pleural effusions, more so on the left
side. There are patchy infiltrates in both lower lung fields with
possible slight worsening suggesting atelectasis/pneumonia.

Extensive skeletal metastatic disease. There is abnormal patchy
enhancement in multiple muscles suggesting possible active
inflammatory or neoplastic process.

Other findings as described in the body of the report.

Extensive skeletal metastatic disease. There is patchy enhancement
in the multiple muscles as described in the body of the report which
may suggest inflammatory or neoplastic process in the soft tissues.
